# Patient Record
Sex: Male | Born: 1950 | Race: Black or African American | Hispanic: No | Marital: Married | Smoking: Former smoker
Health system: Southern US, Community
[De-identification: ages and names within clinical notes are randomized; demographics above are authoritative.]

## PROBLEM LIST (undated history)

## (undated) DIAGNOSIS — N529 Male erectile dysfunction, unspecified: Secondary | ICD-10-CM

## (undated) DIAGNOSIS — G40909 Epilepsy, unspecified, not intractable, without status epilepticus: Secondary | ICD-10-CM

## (undated) DIAGNOSIS — R569 Unspecified convulsions: Secondary | ICD-10-CM

## (undated) DIAGNOSIS — I10 Essential (primary) hypertension: Secondary | ICD-10-CM

## (undated) DIAGNOSIS — F419 Anxiety disorder, unspecified: Secondary | ICD-10-CM

## (undated) DIAGNOSIS — E119 Type 2 diabetes mellitus without complications: Secondary | ICD-10-CM

## (undated) DIAGNOSIS — I82412 Acute embolism and thrombosis of left femoral vein: Secondary | ICD-10-CM

## (undated) DIAGNOSIS — E785 Hyperlipidemia, unspecified: Secondary | ICD-10-CM

## (undated) HISTORY — PX: CHOLECYSTECTOMY: SHX55

## (undated) HISTORY — DX: Anxiety disorder, unspecified: F41.9

## (undated) HISTORY — PX: KNEE SURGERY: SHX244

## (undated) HISTORY — DX: Hyperlipidemia, unspecified: E78.5

## (undated) HISTORY — DX: Male erectile dysfunction, unspecified: N52.9

## (undated) HISTORY — DX: Epilepsy, unspecified, not intractable, without status epilepticus: G40.909

---

## 1998-02-08 ENCOUNTER — Emergency Department (HOSPITAL_COMMUNITY): Admission: EM | Admit: 1998-02-08 | Discharge: 1998-02-08 | Payer: Self-pay | Admitting: Emergency Medicine

## 1998-03-23 ENCOUNTER — Ambulatory Visit (HOSPITAL_COMMUNITY): Admission: RE | Admit: 1998-03-23 | Discharge: 1998-03-23 | Payer: Self-pay | Admitting: Cardiology

## 1998-04-18 ENCOUNTER — Ambulatory Visit (HOSPITAL_COMMUNITY): Admission: RE | Admit: 1998-04-18 | Discharge: 1998-04-18 | Payer: Self-pay | Admitting: Gastroenterology

## 1998-09-24 ENCOUNTER — Emergency Department (HOSPITAL_COMMUNITY): Admission: EM | Admit: 1998-09-24 | Discharge: 1998-09-24 | Payer: Self-pay | Admitting: *Deleted

## 1999-02-06 ENCOUNTER — Other Ambulatory Visit: Admission: RE | Admit: 1999-02-06 | Discharge: 1999-02-06 | Payer: Self-pay | Admitting: Urology

## 1999-10-07 ENCOUNTER — Encounter: Admission: RE | Admit: 1999-10-07 | Discharge: 2000-01-05 | Payer: Self-pay | Admitting: Family Medicine

## 1999-12-03 ENCOUNTER — Ambulatory Visit (HOSPITAL_COMMUNITY): Admission: RE | Admit: 1999-12-03 | Discharge: 1999-12-03 | Payer: Self-pay | Admitting: Family Medicine

## 2000-01-22 ENCOUNTER — Ambulatory Visit (HOSPITAL_BASED_OUTPATIENT_CLINIC_OR_DEPARTMENT_OTHER): Admission: RE | Admit: 2000-01-22 | Discharge: 2000-01-22 | Payer: Self-pay | Admitting: Family Medicine

## 2000-05-11 ENCOUNTER — Encounter: Payer: Self-pay | Admitting: Orthopedic Surgery

## 2000-05-12 ENCOUNTER — Ambulatory Visit (HOSPITAL_COMMUNITY): Admission: RE | Admit: 2000-05-12 | Discharge: 2000-05-12 | Payer: Self-pay | Admitting: Orthopedic Surgery

## 2000-06-12 ENCOUNTER — Encounter: Admission: RE | Admit: 2000-06-12 | Discharge: 2000-07-23 | Payer: Self-pay | Admitting: Orthopedic Surgery

## 2000-07-09 ENCOUNTER — Emergency Department (HOSPITAL_COMMUNITY): Admission: EM | Admit: 2000-07-09 | Discharge: 2000-07-10 | Payer: Self-pay | Admitting: Emergency Medicine

## 2002-07-08 ENCOUNTER — Encounter: Payer: Self-pay | Admitting: Emergency Medicine

## 2002-07-08 ENCOUNTER — Emergency Department (HOSPITAL_COMMUNITY): Admission: EM | Admit: 2002-07-08 | Discharge: 2002-07-08 | Payer: Self-pay | Admitting: Emergency Medicine

## 2002-07-16 ENCOUNTER — Emergency Department (HOSPITAL_COMMUNITY): Admission: EM | Admit: 2002-07-16 | Discharge: 2002-07-16 | Payer: Self-pay | Admitting: Emergency Medicine

## 2002-07-16 ENCOUNTER — Encounter: Payer: Self-pay | Admitting: Emergency Medicine

## 2002-08-16 ENCOUNTER — Encounter: Admission: RE | Admit: 2002-08-16 | Discharge: 2002-11-14 | Payer: Self-pay | Admitting: Orthopedic Surgery

## 2003-07-31 ENCOUNTER — Encounter: Admission: RE | Admit: 2003-07-31 | Discharge: 2003-10-29 | Payer: Self-pay | Admitting: Family Medicine

## 2004-05-13 ENCOUNTER — Encounter (INDEPENDENT_AMBULATORY_CARE_PROVIDER_SITE_OTHER): Payer: Self-pay | Admitting: *Deleted

## 2004-05-13 ENCOUNTER — Ambulatory Visit (HOSPITAL_COMMUNITY): Admission: RE | Admit: 2004-05-13 | Discharge: 2004-05-13 | Payer: Self-pay | Admitting: Gastroenterology

## 2004-07-02 ENCOUNTER — Inpatient Hospital Stay (HOSPITAL_COMMUNITY): Admission: EM | Admit: 2004-07-02 | Discharge: 2004-07-03 | Payer: Self-pay | Admitting: Emergency Medicine

## 2004-07-09 ENCOUNTER — Observation Stay (HOSPITAL_COMMUNITY): Admission: EM | Admit: 2004-07-09 | Discharge: 2004-07-10 | Payer: Self-pay | Admitting: Emergency Medicine

## 2005-09-03 ENCOUNTER — Emergency Department (HOSPITAL_COMMUNITY): Admission: EM | Admit: 2005-09-03 | Discharge: 2005-09-03 | Payer: Self-pay | Admitting: Emergency Medicine

## 2006-08-30 ENCOUNTER — Emergency Department (HOSPITAL_COMMUNITY): Admission: EM | Admit: 2006-08-30 | Discharge: 2006-08-30 | Payer: Self-pay | Admitting: Emergency Medicine

## 2008-01-03 ENCOUNTER — Encounter: Admission: RE | Admit: 2008-01-03 | Discharge: 2008-01-03 | Payer: Self-pay | Admitting: Family Medicine

## 2008-02-22 ENCOUNTER — Encounter: Admission: RE | Admit: 2008-02-22 | Discharge: 2008-02-22 | Payer: Self-pay | Admitting: Orthopedic Surgery

## 2008-09-26 ENCOUNTER — Ambulatory Visit: Payer: Self-pay | Admitting: Hematology and Oncology

## 2008-10-10 LAB — URINALYSIS, MICROSCOPIC - CHCC
Bilirubin (Urine): NEGATIVE
Glucose: NEGATIVE g/dL
Ketones: NEGATIVE mg/dL
Leukocyte Esterase: NEGATIVE
Nitrite: NEGATIVE
Protein: NEGATIVE mg/dL
Specific Gravity, Urine: 1.005 (ref 1.003–1.035)
WBC, UA: NEGATIVE (ref 0–2)
pH: 6 (ref 4.6–8.0)

## 2008-10-10 LAB — CBC WITH DIFFERENTIAL/PLATELET
BASO%: 0.3 % (ref 0.0–2.0)
Basophils Absolute: 0 10*3/uL (ref 0.0–0.1)
EOS%: 4 % (ref 0.0–7.0)
Eosinophils Absolute: 0.2 10*3/uL (ref 0.0–0.5)
HCT: 38.2 % — ABNORMAL LOW (ref 38.4–49.9)
HGB: 12.7 g/dL — ABNORMAL LOW (ref 13.0–17.1)
LYMPH%: 24.8 % (ref 14.0–49.0)
MCH: 26.4 pg — ABNORMAL LOW (ref 27.2–33.4)
MCHC: 33.3 g/dL (ref 32.0–36.0)
MCV: 79.2 fL — ABNORMAL LOW (ref 79.3–98.0)
MONO#: 0.2 10*3/uL (ref 0.1–0.9)
MONO%: 3.1 % (ref 0.0–14.0)
NEUT#: 3.8 10*3/uL (ref 1.5–6.5)
NEUT%: 67.8 % (ref 39.0–75.0)
Platelets: 245 10*3/uL (ref 140–400)
RBC: 4.82 10*6/uL (ref 4.20–5.82)
RDW: 15.6 % — ABNORMAL HIGH (ref 11.0–14.6)
WBC: 5.6 10*3/uL (ref 4.0–10.3)
lymph#: 1.4 10*3/uL (ref 0.9–3.3)

## 2008-10-13 LAB — HEMOGLOBINOPATHY EVALUATION
Hemoglobin Other: 0 % (ref 0.0–0.0)
Hgb A2 Quant: 3.6 % — ABNORMAL HIGH (ref 2.2–3.2)
Hgb A: 65 % — ABNORMAL LOW (ref 96.8–97.8)
Hgb F Quant: 0 % (ref 0.0–2.0)
Hgb S Quant: 31.4 % — ABNORMAL HIGH (ref 0.0–0.0)

## 2008-10-13 LAB — COMPREHENSIVE METABOLIC PANEL
ALT: 14 U/L (ref 0–53)
AST: 13 U/L (ref 0–37)
Albumin: 4.5 g/dL (ref 3.5–5.2)
Alkaline Phosphatase: 43 U/L (ref 39–117)
BUN: 14 mg/dL (ref 6–23)
CO2: 27 mEq/L (ref 19–32)
Calcium: 9.8 mg/dL (ref 8.4–10.5)
Chloride: 101 mEq/L (ref 96–112)
Creatinine, Ser: 1.05 mg/dL (ref 0.40–1.50)
Glucose, Bld: 122 mg/dL — ABNORMAL HIGH (ref 70–99)
Potassium: 4.2 mEq/L (ref 3.5–5.3)
Sodium: 138 mEq/L (ref 135–145)
Total Bilirubin: 0.3 mg/dL (ref 0.3–1.2)
Total Protein: 6.9 g/dL (ref 6.0–8.3)

## 2008-10-13 LAB — VITAMIN B12: Vitamin B-12: 356 pg/mL (ref 211–911)

## 2008-10-13 LAB — PROTEIN ELECTROPHORESIS, SERUM, WITH REFLEX
Albumin ELP: 62.3 % (ref 55.8–66.1)
Alpha-1-Globulin: 4.7 % (ref 2.9–4.9)
Alpha-2-Globulin: 10.5 % (ref 7.1–11.8)
Beta 2: 4.7 % (ref 3.2–6.5)
Beta Globulin: 7.1 % (ref 4.7–7.2)
Gamma Globulin: 10.7 % — ABNORMAL LOW (ref 11.1–18.8)
Total Protein, Serum Electrophoresis: 6.9 g/dL (ref 6.0–8.3)

## 2008-10-13 LAB — DIRECT ANTIGLOBULIN TEST (NOT AT ARMC)
DAT (Complement): NEGATIVE
DAT IgG: NEGATIVE

## 2008-10-13 LAB — IGG, IGA, IGM
IgA: 134 mg/dL (ref 68–378)
IgG (Immunoglobin G), Serum: 774 mg/dL (ref 694–1618)
IgM, Serum: 25 mg/dL — ABNORMAL LOW (ref 60–263)

## 2008-10-13 LAB — HAPTOGLOBIN: Haptoglobin: 209 mg/dL — ABNORMAL HIGH (ref 16–200)

## 2008-10-13 LAB — IRON AND TIBC
%SAT: 18 % — ABNORMAL LOW (ref 20–55)
Iron: 71 ug/dL (ref 42–165)
TIBC: 391 ug/dL (ref 215–435)
UIBC: 320 ug/dL

## 2008-10-13 LAB — IFE INTERPRETATION

## 2008-10-13 LAB — FERRITIN: Ferritin: 13 ng/mL — ABNORMAL LOW (ref 22–322)

## 2009-04-12 ENCOUNTER — Ambulatory Visit: Payer: Self-pay | Admitting: Oncology

## 2009-04-24 LAB — CBC WITH DIFFERENTIAL/PLATELET
BASO%: 0.1 % (ref 0.0–2.0)
Basophils Absolute: 0 10*3/uL (ref 0.0–0.1)
EOS%: 3 % (ref 0.0–7.0)
Eosinophils Absolute: 0.2 10*3/uL (ref 0.0–0.5)
HCT: 36.7 % — ABNORMAL LOW (ref 38.4–49.9)
HGB: 12.3 g/dL — ABNORMAL LOW (ref 13.0–17.1)
LYMPH%: 29.2 % (ref 14.0–49.0)
MCH: 25.9 pg — ABNORMAL LOW (ref 27.2–33.4)
MCHC: 33.5 g/dL (ref 32.0–36.0)
MCV: 77.3 fL — ABNORMAL LOW (ref 79.3–98.0)
MONO#: 0.4 10*3/uL (ref 0.1–0.9)
MONO%: 5.2 % (ref 0.0–14.0)
NEUT#: 4.4 10*3/uL (ref 1.5–6.5)
NEUT%: 62.5 % (ref 39.0–75.0)
Platelets: 242 10*3/uL (ref 140–400)
RBC: 4.75 10*6/uL (ref 4.20–5.82)
RDW: 14.6 % (ref 11.0–14.6)
WBC: 7.1 10*3/uL (ref 4.0–10.3)
lymph#: 2.1 10*3/uL (ref 0.9–3.3)

## 2009-04-24 LAB — IRON AND TIBC
%SAT: 29 % (ref 20–55)
Iron: 104 ug/dL (ref 42–165)
TIBC: 359 ug/dL (ref 215–435)
UIBC: 255 ug/dL

## 2009-04-24 LAB — FERRITIN: Ferritin: 32 ng/mL (ref 22–322)

## 2009-06-20 ENCOUNTER — Encounter: Admission: RE | Admit: 2009-06-20 | Discharge: 2009-06-20 | Payer: Self-pay | Admitting: Orthopedic Surgery

## 2009-07-03 ENCOUNTER — Encounter: Admission: RE | Admit: 2009-07-03 | Discharge: 2009-10-01 | Payer: Self-pay | Admitting: Orthopedic Surgery

## 2009-10-01 ENCOUNTER — Inpatient Hospital Stay (HOSPITAL_COMMUNITY): Admission: EM | Admit: 2009-10-01 | Discharge: 2009-10-03 | Payer: Self-pay | Admitting: Emergency Medicine

## 2009-10-01 ENCOUNTER — Emergency Department (HOSPITAL_COMMUNITY): Admission: EM | Admit: 2009-10-01 | Discharge: 2009-10-01 | Payer: Self-pay | Admitting: Family Medicine

## 2009-10-31 ENCOUNTER — Ambulatory Visit: Payer: Self-pay | Admitting: Oncology

## 2010-06-24 ENCOUNTER — Encounter: Payer: Self-pay | Admitting: Orthopedic Surgery

## 2010-08-20 LAB — CBC
HCT: 36.6 % — ABNORMAL LOW (ref 39.0–52.0)
Hemoglobin: 12 g/dL — ABNORMAL LOW (ref 13.0–17.0)
MCHC: 32.9 g/dL (ref 30.0–36.0)
MCV: 82.1 fL (ref 78.0–100.0)
Platelets: 216 10*3/uL (ref 150–400)
RBC: 4.46 MIL/uL (ref 4.22–5.81)
RDW: 15 % (ref 11.5–15.5)
WBC: 5.4 10*3/uL (ref 4.0–10.5)

## 2010-08-20 LAB — TSH: TSH: 1.53 u[IU]/mL (ref 0.350–4.500)

## 2010-08-20 LAB — BASIC METABOLIC PANEL
BUN: 11 mg/dL (ref 6–23)
CO2: 29 mEq/L (ref 19–32)
Calcium: 8.8 mg/dL (ref 8.4–10.5)
Chloride: 102 mEq/L (ref 96–112)
Creatinine, Ser: 0.93 mg/dL (ref 0.4–1.5)
GFR calc Af Amer: >60 mL/min (ref >60)
GFR calc non Af Amer: >60 mL/min (ref >60)
Glucose, Bld: 90 mg/dL (ref 70–99)
Potassium: 3.8 mEq/L (ref 3.5–5.1)
Sodium: 136 mEq/L (ref 135–145)

## 2010-08-20 LAB — CK TOTAL AND CKMB (NOT AT ARMC)
CK, MB: 0.7 ng/mL (ref 0.3–4.0)
Relative Index: 0.3 (ref 0.0–2.5)
Total CK: 219 U/L (ref 7–232)

## 2010-08-20 LAB — CARDIAC PANEL(CRET KIN+CKTOT+MB+TROPI)
CK, MB: 0.5 ng/mL (ref 0.3–4.0)
CK, MB: 0.6 ng/mL (ref 0.3–4.0)
Relative Index: 0.3 (ref 0.0–2.5)
Relative Index: 0.3 (ref 0.0–2.5)
Total CK: 155 U/L (ref 7–232)
Total CK: 176 U/L (ref 7–232)
Troponin I: 0.02 ng/mL (ref 0.00–0.06)
Troponin I: <0.01 ng/mL (ref 0.00–0.06)

## 2010-08-20 LAB — POCT CARDIAC MARKERS
CKMB, poc: <1 ng/mL — ABNORMAL LOW (ref 1.0–8.0)
Myoglobin, poc: 53.9 ng/mL (ref 12–200)
Troponin i, poc: <0.05 ng/mL (ref 0.00–0.09)

## 2010-08-20 LAB — LIPID PANEL
Cholesterol: 166 mg/dL (ref 0–200)
Cholesterol: 185 mg/dL (ref 0–200)
HDL: 62 mg/dL (ref >39)
HDL: 79 mg/dL (ref >39)
LDL Cholesterol: 97 mg/dL (ref 0–99)
LDL Cholesterol: 98 mg/dL (ref 0–99)
Total CHOL/HDL Ratio: 2.3 RATIO
Total CHOL/HDL Ratio: 2.7 RATIO
Triglycerides: 33 mg/dL (ref <150)
Triglycerides: 39 mg/dL (ref <150)
VLDL: 7 mg/dL (ref 0–40)
VLDL: 8 mg/dL (ref 0–40)

## 2010-08-20 LAB — GLUCOSE, CAPILLARY
Glucose-Capillary: 101 mg/dL — ABNORMAL HIGH (ref 70–99)
Glucose-Capillary: 102 mg/dL — ABNORMAL HIGH (ref 70–99)
Glucose-Capillary: 106 mg/dL — ABNORMAL HIGH (ref 70–99)
Glucose-Capillary: 122 mg/dL — ABNORMAL HIGH (ref 70–99)
Glucose-Capillary: 147 mg/dL — ABNORMAL HIGH (ref 70–99)
Glucose-Capillary: 152 mg/dL — ABNORMAL HIGH (ref 70–99)
Glucose-Capillary: 90 mg/dL (ref 70–99)

## 2010-08-20 LAB — POCT I-STAT, CHEM 8
BUN: 13 mg/dL (ref 6–23)
Calcium, Ion: 1.23 mmol/L (ref 1.12–1.32)
Chloride: 104 mEq/L (ref 96–112)
Creatinine, Ser: 0.9 mg/dL (ref 0.4–1.5)
Glucose, Bld: 89 mg/dL (ref 70–99)
HCT: 41 % (ref 39.0–52.0)
Hemoglobin: 13.9 g/dL (ref 13.0–17.0)
Potassium: 4.2 mEq/L (ref 3.5–5.1)
Sodium: 141 mEq/L (ref 135–145)
TCO2: 31 mmol/L (ref 0–100)

## 2010-08-20 LAB — APTT: aPTT: 30 seconds (ref 24–37)

## 2010-08-20 LAB — TROPONIN I: Troponin I: 0.11 ng/mL — ABNORMAL HIGH (ref 0.00–0.06)

## 2010-10-18 NOTE — Discharge Summary (Signed)
NAMEEDRIS, Darryl [name]                ACCOUNT NO.:  [id number]   MEDICAL RECORD NO.:  [id number]          PATIENT TYPE:  INP   LOCATION:  4733                         FACILITY:  [facility]   PHYSICIAN:  Danae Chen, M.D.DATE OF BIRTH:  November 22, 1950   DATE OF ADMISSION:  07/02/2004  DATE OF DISCHARGE:                                 DISCHARGE SUMMARY   PRIMARY CARE PHYSICIAN:  Dr. Renaye Rakers.   CARDIOLOGIST:  Dr. Elsie Lincoln of [facility].   DISCHARGE DIAGNOSES:  1.  Chest pain.  2.  Diabetes.  3.  Hypertension.  4.  Reflux disease.   DISCHARGE MEDICATIONS:  The patient is to resume his home medication list.  This includes:  1.  Prevacid 30 mg p.o. daily.  2.  Cozaar 50 mg p.o. daily.  3.  Aspirin 81 mg p.o. daily.  4.  Actos 15 mg p.o. daily.  5.  Glucophage 1000 mg p.o. daily.  6.  Lamictal 200 mg p.o. daily.  7.  Lasix 10 mg p.o. daily.  8.  Xanax 1 mg p.o. b.i.d. p.r.n.   BRIEF HOSPITAL COURSE:  The patient is a 60 year old African-American male  with cardiac risk factors that include diabetes and hypertension who had  episodes of recurrent intermittent chest pain over the last several weeks.  The last episode prompted him to visit the emergency room.  He is admitted  for further evaluation.  EKG was normal at the time of admission.  Cardiac  enzymes and cardiac markers are also negative.  The patient has been pain  free since his admission.  His vital signs are stable and physical exam is  unchanged at the time of discharge.  He has had a Cardiolite performed today  - July 03, 2004 - results of which are pending at this time.  If the  results show no inducible ischemia, the patient will be discharged home with  close follow-up with Dr. Elsie Lincoln of [facility].  The patient also has a lipid panel and homocysteine levels which are also  pending, and dependent on the results of that he may or may not need a  statin.  We  will defer this decision to his primary care physician.  The  patient is to continue his other medications as noted.   OTHER PERTINENT DISCHARGE LABORATORY DATA:  As noted, the lipid panel which  is pending and negative cardiac markers.  Also had a hemoglobin of 15.6.  Normal PT and INR.  Normal basic metabolic panel with  a BUN of 9, creatinine of 1.1.  The patient also had a CT angiogram which  was negative for pulmonary emboli.  Hemoglobin A1c was 6.1.  Portable chest  x-ray showed no evidence of acute disease.   CONDITION ON DISCHARGE:  Improved.      RLK/MEDQ  D:  07/03/2004  T:  07/03/2004  Job:  [id number]

## 2010-10-18 NOTE — Op Note (Signed)
NAMEAPOLLO, TIMOTHY                ACCOUNT NO.:  [id number]   MEDICAL RECORD NO.:  [id number]          PATIENT TYPE:  AMB   LOCATION:  ENDO                         FACILITY:  [facility]   PHYSICIAN:  Anselmo Rod, M.D.  DATE OF BIRTH:  03/19/1951   DATE OF PROCEDURE:  DATE OF DISCHARGE:                                 OPERATIVE REPORT   DATE OF PROCEDURE:  May 13, 2004.   PROCEDURE PERFORMED:  Screening colonoscopy   ENDOSCOPIST:  Anselmo Rod, MD.   INSTRUMENT USED:  Olympus video colonoscope.   INDICATIONS FOR PROCEDURE:  Rectal bleeding and a formal weight loss of 50  pounds that has been unintentional in the recent past in a 60 year old,  African-American male.  Rule out colonic polyps, masses, etc.   PRE-PROCEDURE PREPARATION:  Informed consent was procured from the patient.  The patient was fasted for eight hours prior to the procedure and prepped  with a bottle of magnesium citrate and a gallon of GoLYTELY the night prior  to the procedure.   PRE-PROCEDURE PHYSICAL:  VITAL SIGNS:  The patient had stable vital signs.  NECK:  Supple.  CHEST:  Clear to auscultation, S1 and S2 regular.  ABDOMEN:  Soft with normal bowel sounds.   DESCRIPTION OF PROCEDURE:  The patient was placed in the left lateral  decubitus position and sedated with 50 mg of Demerol and 5 mg of Versed in  slow, incremental doses.  Once the patient was adequately sedated,  maintained on low flow oxygen, and continuous cardiac monitoring, the  Olympus video colonoscope was advanced into the rectum and the cecum.  There  was evidence of pan-diverticulosis with most prominent changes in the left  colon.  Small, internal hemorrhoids were seen on retroflexion.  The  procedure was completed up to the cecum, and the appendiceal orifices and  ileocecal valve were clearly visualized and photographed.  Retroflexion in  the rectum revealed small, internal hemorrhoids.  The patient tolerated the  procedure well  without complications.  The terminal ileum appeared healthy  and without lesions.   IMPRESSION:  1.  Pan-diverticulosis with more prominence seen within the sigmoid colon.  2.  Small, internal hemorrhoids.  3.  Normal terminal ileum.   RECOMMENDATIONS:  1.  Continue a high fiber diet with liberal fluid intake.  2.  Repeat colonoscopy in the next five years unless the patient develops      any abnormal symptoms in the interim.  3.  Outpatient followup in the next two weeks for further recommendations.      Jyot   JNM/MEDQ  D:  05/13/2004  T:  05/13/2004  Job:  [id number]   cc:   Renaye Rakers, M.D.  [phone]  [place]  [place] [address]  Fax: [phone]

## 2010-10-18 NOTE — Cardiovascular Report (Signed)
NAMESAMUELE, STOREY                ACCOUNT NO.:  [id number]   MEDICAL RECORD NO.:  [id number]          PATIENT TYPE:  INP   LOCATION:  3729                         FACILITY:  [facility]   PHYSICIAN:  Cristy Hilts. Jacinto Halim, MD       DATE OF BIRTH:  Sep 13, 1950   DATE OF PROCEDURE:  07/10/2004  DATE OF DISCHARGE:                              CARDIAC CATHETERIZATION   REFERRING PHYSICIAN:  Renaye Rakers, M.D.   PROCEDURES PERFORMED:  1.  Left ventriculography.  2.  Right and left coronary arteriography.  3.  Abdominal aortogram.   INDICATIONS:  Darryl [name] is a 60 year old gentleman who has history of  hypertension, hyperlipidemia, diabetes who has been having recurrent chest  discomfort.  He had undergone a Cardiolite stress test which was negative  for myocardial ischemia.  He was seen in my office 2 days ago with chest  discomfort and I advised him cardiac catheterization for the definitive  diagnosis of coronary disease; however, he wanted to think it about this;  however, he presented to the emergency room the next day with chest pain.  After admitting to the hospital as unstable angina, he was brought to the  cardiac catheterization lab for definitive diagnosis of coronary artery  disease.   HEMODYNAMIC DATA:  The left ventricular pressures were 121/500, end-  diastolic of 11 mmHg.  The aortic pressure was 121/77 with a mean of 99  mmHg.  There was no pressure across the aortic valve.   ASCENDING AORTOGRAM:  The ascending aortogram revealed presence of three  aortic valve cusps.  There was an acute angle takeoff of the left  ventricular outflow tract.   ANGIOGRAPHIC DATA:  Left ventricle:  The left ventricular systolic function  is normal.  The ejection fraction was estimated at 60%.  There was no  significant mitral regurgitation.   Right coronary artery:  The right coronary artery has an inferior takeoff.  This is secondary to an angulated aortic root.  The right coronary itself  was  very highly tortuous making a 360 degree turn in its mid segment;  otherwise this RCA was normal.   Left main:  The left main was a large caliber vessel, normal.   Circumflex:  The circumflex was a large caliber vessel.  It gives origin to  a large obtuse marginal.  It gives rise to a obtuse marginal 2 after giving  the AV groove branch.  It is normal.   Left ventricle:  The left ventricle is a large caliber vessel.  It gives  origin to three small to moderate diagonal vessels.  It ends just after  wrapping around the apex.  It is normal.   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  They are widely patent.  There is no evidence  of abdominal aortic aneurysm.   IMPRESSION:  1.  Normal coronary arteries with normal left ventricular systolic function.  2.  Angulated take-off of the aortic root with inferior takeoff of the right      coronary artery.  3.  No significant change in coronary anatomy compared to March 23, 1998.   RECOMMENDATIONS:  Evaluation for noncardiac cause of chest pain is  indicated.  Primary prevention is indicated.   TECHNIQUES OF PROCEDURE:  Under the usual sterile precautions, using a 6-  French right femoral artery access, a 6 [place] multipurpose B2 catheter was  advanced into the ascending aorta over a 0.035 J-wire.  The catheter was  then gently advanced to the left ventricle and left ventricular pressure was  monitored.  Hand contrast injection was performed both in the LAO and RAO  projections.  The catheter was flushed with saline and pulled back in the  ascending aorta.  Pressure gradient across the aortic valve was monitored.  The right coronary artery was selectively engaged and angiography was  performed.  In a similar fashion, the left main coronary artery was  subsequently engaged, but because of inability to engage the left main, the  catheter was pulled out of the body and the  6-French Judkins diagnostic  catheter was utilized to engage the left main coronary artery and  angiography was repeated.  Then the catheter was pulled out of the body in  the usual fashion.  Before pulling the MPB2 catheter out of the body, the  abdominal aortogram was performed.  The ascending aortogram was performed  because of the inability to cannulate the RCA.  This was done in the lateral  projection.  Oral 110 mL of contrast was utilized.  No immediate  complications were noted.  The patient tolerated the procedure well.   Right femoral angiography was performed through the arterial access sheath  and access was closed with Angioseal with excellent hemostasis obtained.      JRG/MEDQ  D:  07/10/2004  T:  07/10/2004  Job:  [id number]   cc:   Renaye Rakers, M.D.  [address]  [place]  [place] [address]  Fax: [phone]

## 2010-10-18 NOTE — H&P (Signed)
Darryl [name], STAN                ACCOUNT NO.:  [id number]   MEDICAL RECORD NO.:  [id number]          PATIENT TYPE:  INP   LOCATION:  1833                         FACILITY:  [facility]   PHYSICIAN:  Jonna L. Robb Matar, M.D.DATE OF BIRTH:  [birth year]   DATE OF ADMISSION:  07/02/2004  DATE OF DISCHARGE:                                HISTORY & PHYSICAL   PRIMARY CARE PHYSICIAN:  Renaye Rakers, M.D.   CARDIOLOGIST:  Madaline Savage, M.D.   CHIEF COMPLAINT:  Chest pain.   HISTORY:  This is a 60 year old African-American hypotensive diabetic male  who first had an episode about a month ago of chest pain and then again  about a week ago.  He had also recently moved a washer and a dryer.  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  His blood pressures are usually  excellently controlled, but the last few times he has gotten 156/110,  140/95.  He got an episode of chest pain today starting at around 9 a.m.,  and came to the hospital around 10:30.  He has been treated with  nitroglycerin, morphine, GI cocktails, but not much improvement.  He still  tends to get this soreness and throbbing in the left chest, maybe a couple  of inches off the sternum, it moves a little bit towards the left axilla,  not pleuritic, not associated with food, there is nothing specific he can  think of that makes it either better or worse.   About six or seven years ago he also had some chest pain.  At that time, he  had some cardiac evaluation by Dr. Elsie Lincoln, including what sounds like stress  test, and was also told that the source of his chest pain was his  hypertension.   ALLERGIES:  No known drug allergies.   MEDICATIONS:  1.  Cozaar.  2.  Lamictal 250 mg q.a.m.  3.  Nexium q.a.m.  4.  Actos at bedtime.  5.  Metformin three pills at bedtime.  He is not really sure what the dosages are.   PAST MEDICAL HISTORY:  1.  GERD.  2.  Hypertension, which he has had for about six years.  3.  Type 2 diabetes.  He has had this for a year.  He said when it was found      his sugar was 600, and he really was not aware of it.  4.  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  I think his      last one was about six months ago, and he does get small episodes of      abstince which really are not even noticed by other people.  5.  Rectal bleed in December 2005, was found to have pan diverticulosis by      Dr. Loreta Ave.  6.  Hyperlipidemia by history, although it does not look like he is on any      statin medications.  PAST SURGICAL HISTORY:  1.  Laparoscopic cholecystectomy.  2.  Left knee surgery.   FAMILY HISTORY:  Alzheimer's, diabetes, aneurysm.   SOCIAL HISTORY:  He is married, nonsmoker, nondrinker, four children, works  as an Airline pilot at [employer].   REVIEW OF SYSTEMS:  The patient denies any weight loss at this time,  although he had a gradual 50 pound weight loss that was noted in the  information of his December colonoscopy.  He denies visual problems,  coughing, wheezing, asthma.  He does have peripheral edema.  He does not  think that has gotten worse even though he has been eating a lot of salty  fast food.  No nausea, vomiting, diarrhea, hematemesis.  No kidney problems.  No prostate disease.  No thyroid problems.  Denies any unusual bleeding or  bruising.  No arthritis, no headache or CVA.  Other review of systems are  negative.   PHYSICAL EXAMINATION:  VITAL SIGNS:  Temperature 98.1, pulse 62,  respirations 20, blood pressure 127/77, he is 100% O2.  GENERAL:  He is a well-developed African-American male.  HEENT:  Normal conjunctivae and lids.  Pupils are slightly constricted.  EOM's are full.  Normal hearing, mucosa, pharynx.  NECK:  No masses, thyromegaly, or carotid bruits.  There is a lipoma on the  upper back.  RESPIRATORY:  Respiratory effort is normal.  LUNGS:  Clear at the bases.   There is no wheezes, rales, or dullness.  HEART:  Regular rate and rhythm, normal S1 and S2, without murmurs, rubs, or  gallops.  Pulses without bruits.  EXTREMITIES:  There is no cyanosis or clubbing.  He has 1+ bilateral edema.  ABDOMEN:  Nontender, normal bowel sounds, no hepatosplenomegaly, no hernias.  No adenopathy.  NEUROLOGIC:  Muscle strength is 5/5 with full range of motion in all  extremities.  Cranial nerves are intact.  DTR's are 2+ in the knees.  He has  normal sensation in the feet.  He is alert and oriented x3, normal memory,  judgment, and affect.  SKIN:  No rashes, lesions, or nodules.   LABORATORY DATA:  EKG is normal with an occasional rare PVC.  Sugar is 93.  The rest of the CMP is normal.  CBC is normal.  First two sets of point-of-  care are negative.  Chest x-ray shows no acute disease.   IMPRESSION:  1.  Chest pain.  This has some elements of costochondritis.  It ended up      tender when he pressed on it and has a throbbing quality to it.      However, of course with his risk factors, he certainly needs to have      coronary artery disease ruled out.  I contacted Dr. Jenne Campus and he will      have stress test tomorrow done by [facility].  2.  Hypertension.  This may be the source of the chest pain.  I am going to      give him some diuresis and continue his Cozaar.  3.  Type 2 diabetes.  Evaluate for his medications.  4.  Hyperlipidemia.  Let us check this and see if we need to start him on      any statin medications.  5.  Petymal seizures.  6.  Diverticulosis.      JLB/MEDQ  D:  07/02/2004  T:  07/02/2004  Job:  [id number]   cc:   Renaye Rakers, M.D.  [phone] [address],  [address]  [place]  [place] [address]  Fax: [phone]

## 2012-03-08 ENCOUNTER — Other Ambulatory Visit: Payer: Self-pay | Admitting: Family Medicine

## 2012-03-08 ENCOUNTER — Ambulatory Visit
Admission: RE | Admit: 2012-03-08 | Discharge: 2012-03-08 | Disposition: A | Discharge status: Home / Self Care | Payer: BC Managed Care – PPO | Source: Ambulatory Visit | Attending: Family Medicine | Admitting: Family Medicine

## 2012-03-08 DIAGNOSIS — R0602 Shortness of breath: Secondary | ICD-10-CM

## 2013-04-29 ENCOUNTER — Emergency Department (HOSPITAL_COMMUNITY)
Admission: EM | Admit: 2013-04-29 | Discharge: 2013-04-29 | Disposition: A | Discharge status: Home / Self Care | Payer: BC Managed Care – PPO | Source: Home / Self Care | Attending: Emergency Medicine | Admitting: Emergency Medicine

## 2013-04-29 ENCOUNTER — Encounter (HOSPITAL_COMMUNITY): Payer: Self-pay | Admitting: Emergency Medicine

## 2013-04-29 ENCOUNTER — Other Ambulatory Visit (HOSPITAL_COMMUNITY)
Admission: RE | Admit: 2013-04-29 | Discharge: 2013-04-29 | Disposition: A | Discharge status: Home / Self Care | Payer: BC Managed Care – PPO | Source: Ambulatory Visit | Attending: Emergency Medicine | Admitting: Emergency Medicine

## 2013-04-29 DIAGNOSIS — N476 Balanoposthitis: Secondary | ICD-10-CM

## 2013-04-29 DIAGNOSIS — N390 Urinary tract infection, site not specified: Secondary | ICD-10-CM

## 2013-04-29 DIAGNOSIS — N481 Balanitis: Secondary | ICD-10-CM

## 2013-04-29 DIAGNOSIS — Z113 Encounter for screening for infections with a predominantly sexual mode of transmission: Secondary | ICD-10-CM | POA: Insufficient documentation

## 2013-04-29 HISTORY — DX: Essential (primary) hypertension: I10

## 2013-04-29 HISTORY — DX: Type 2 diabetes mellitus without complications: E11.9

## 2013-04-29 HISTORY — DX: Unspecified convulsions: R56.9

## 2013-04-29 LAB — POCT URINALYSIS DIP (DEVICE)
Bilirubin Urine: NEGATIVE
Glucose, UA: NEGATIVE mg/dL
Leukocytes, UA: NEGATIVE
Nitrite: NEGATIVE
Protein, ur: >=300 mg/dL — AB
Specific Gravity, Urine: >=1.03 (ref 1.005–1.030)
Urobilinogen, UA: 0.2 mg/dL (ref 0.0–1.0)
pH: 5.5 (ref 5.0–8.0)

## 2013-04-29 MED ORDER — NYSTATIN 100000 UNIT/GM EX CREA: TOPICAL_CREAM | CUTANEOUS | Status: DC

## 2013-04-29 MED ORDER — CIPROFLOXACIN HCL 500 MG PO TABS: 500.0000 mg | ORAL_TABLET | Freq: Two times a day (BID) | ORAL | Status: DC

## 2013-04-29 NOTE — ED Notes (Signed)
C/o 2 day duration of pain in penis; denies injury , denies d/c or pain w urination

## 2013-04-29 NOTE — ED Provider Notes (Addendum)
Chief Complaint:   Chief Complaint  Patient presents with  . Penis Pain    History of Present Illness:   Darryl [name] is a pleasant 62 year old male with type 2 diabetes who has had a two-day history of penile pain. Initially the pain was located at the tip of the penis, but then this moved up to the shaft of the penis. He's had slight burning with urination. He can feel a palpable cord on the dorsum of the penis. There is no deviation the penis, his urine stream is good. He denies any lower back pain, fever, or chills. There is no abdominal pain or testicular pain or swelling. No inguinal lymphadenopathy. The patient has had blood in his urine for years and has been evaluated by his urologist for this. He is uncircumcised.  Review of Systems:  Other than noted above, the patient denies any of the following symptoms: General:  No fevers, chills, sweats, aches, or fatigue. GI:  No abdominal pain, back pain, nausea, vomiting, diarrhea, or constipation. GU:  No dysuria, frequency, urgency, hematuria, urethral discharge, penile lesions, penile pain, testicular pain, swelling, or mass, inguinal lymphadenopathy or incontinence.  PMFSH:  Past medical history, family history, social history, meds, and allergies were reviewed.  He has no medication allergies. He has hypertension, seizures, and type 2 diabetes. Current meds include vitamin D, Lamictal, Ativan, losartan/hydrochlorothiazide, metformin, and potassium chloride.  Physical Exam:   Vital signs:  BP 147/88  Pulse 70  Temp(Src) 97.5 F (36.4 C) (Oral)  Resp 16  SpO2 98% Gen:  Alert, oriented, in no distress. Lungs:  Clear to auscultation, no wheezes, rales or rhonchi. Heart:  Regular rhythm, no gallop or murmer. Abdomen:  Flat and soft.  No tenderness to palpation, guarding, or rebound.  No hepato-splenomegaly or mass.  Bowel sounds were normally active.  No hernia. Genital exam:  He is uncircumcised. There is slight erythema around the  coronal sulcus. There is no swelling of the foreskin. 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 There no ulcerations or lesions of the penis, no urethral discharge, testes are normal, no inguinal lymphadenopathy. Rectal exam:  No masses, no tenderness, prostate is normal in size, shape, and consistency, no nodules, and stool was heme-negative Back:  No CVA tenderness.  Skin:  Clear, warm and dry.  Labs:   Results for orders placed during the hospital encounter of 04/29/13  POCT URINALYSIS DIP (DEVICE)      Result Value Range   Glucose, UA NEGATIVE  NEGATIVE mg/dL   Bilirubin Urine NEGATIVE  NEGATIVE   Ketones, ur TRACE (*) NEGATIVE mg/dL   Specific Gravity, Urine >=1.030  1.005 - 1.030   Hgb urine dipstick MODERATE (*) NEGATIVE   pH 5.5  5.0 - 8.0   Protein, ur >=300 (*) NEGATIVE mg/dL   Urobilinogen, UA 0.2  0.0 - 1.0 mg/dL   Nitrite NEGATIVE  NEGATIVE   Leukocytes, UA NEGATIVE  NEGATIVE    A urine culture was obtained as well as DNA probes for gonorrhea, Chlamydia, Trichomonas.  Assessment: The primary encounter diagnosis was Balanitis. A diagnosis of UTI (lower urinary tract infection) was also pertinent to this visit.   Most likely cause of balanitis in this diabetic, uncircumcised male with a candidate, therefore he was given nystatin. It's possible he may also have urinary tract infection since he does have some dysuria, he had some hemoglobin in his urine, but he states this is  a chronic problem and his urologist has worked this up. 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 This should clear up with time. I recommended moist warm compresses. He's taking a baby aspirin a day. He's to watch out for symptoms of Peyronie's disease such as deviation of the tip of the penis either in a flaccid or erect state. He should report this to his urologist.  Plan:   1.  Meds:  The following meds were prescribed:    Discharge Medication List as of 04/29/2013  8:22 PM    START taking these medications   Details  ciprofloxacin (CIPRO) 500 MG tablet Take 1 tablet (500 mg total) by mouth every 12 (twelve) hours., Starting 04/29/2013, Until Discontinued, Normal    nystatin cream (MYCOSTATIN) Apply to affected area 2 times daily, Normal        2.  Patient Education/Counseling:  The patient was given appropriate handouts, self care instructions, and instructed in symptomatic relief.    3.  Follow up:  The patient was told to follow up if no better in 3 to 4 days, if becoming worse in any way, and given some red flag symptoms such as fever or difficulty urinating which would prompt immediate return.  Follow up with his urologist or primary care physician for ongoing symptoms.      [name], MD 04/29/13 2059  Addendum: The patient came back today stating that the Cipro was not agreeing with him. He felt was making his blood sugar go down and his blood pressure go up. His culture did not grow anything out, but I still think he has mild prostatitis. A prescription was sent into his pharmacy for cephalexin 500 mg #30 one 3 times a day.  [name], MD 05/03/13 [phone]

## 2013-05-01 LAB — URINE CULTURE
Colony Count: NO GROWTH
Culture: NO GROWTH
Special Requests: NORMAL

## 2013-05-03 MED ORDER — CEPHALEXIN 500 MG PO CAPS: 500.0000 mg | ORAL_CAPSULE | Freq: Three times a day (TID) | ORAL | Status: DC

## 2014-02-24 ENCOUNTER — Other Ambulatory Visit: Payer: Self-pay | Admitting: Orthopedic Surgery

## 2014-02-24 ENCOUNTER — Ambulatory Visit
Admission: RE | Admit: 2014-02-24 | Discharge: 2014-02-24 | Disposition: A | Discharge status: Home / Self Care | Payer: BC Managed Care – PPO | Source: Ambulatory Visit | Attending: Orthopedic Surgery | Admitting: Orthopedic Surgery

## 2014-02-24 DIAGNOSIS — M25551 Pain in right hip: Secondary | ICD-10-CM

## 2014-03-24 ENCOUNTER — Ambulatory Visit
Admission: RE | Admit: 2014-03-24 | Discharge: 2014-03-24 | Disposition: A | Discharge status: Home / Self Care | Payer: BC Managed Care – PPO | Source: Ambulatory Visit | Attending: Orthopedic Surgery | Admitting: Orthopedic Surgery

## 2014-03-24 ENCOUNTER — Other Ambulatory Visit: Payer: Self-pay | Admitting: Orthopedic Surgery

## 2014-03-24 DIAGNOSIS — M7541 Impingement syndrome of right shoulder: Secondary | ICD-10-CM

## 2014-08-02 ENCOUNTER — Other Ambulatory Visit: Payer: Self-pay | Admitting: Family Medicine

## 2014-08-02 DIAGNOSIS — Z8719 Personal history of other diseases of the digestive system: Secondary | ICD-10-CM

## 2014-08-02 DIAGNOSIS — R109 Unspecified abdominal pain: Secondary | ICD-10-CM

## 2014-08-03 ENCOUNTER — Ambulatory Visit
Admission: RE | Admit: 2014-08-03 | Discharge: 2014-08-03 | Disposition: A | Discharge status: Home / Self Care | Payer: BC Managed Care – PPO | Source: Ambulatory Visit | Attending: Family Medicine | Admitting: Family Medicine

## 2014-08-03 ENCOUNTER — Inpatient Hospital Stay: Admission: RE | Admit: 2014-08-03 | Payer: BC Managed Care – PPO | Source: Ambulatory Visit

## 2014-08-03 MED ORDER — IOPAMIDOL (ISOVUE-300) INJECTION 61%
100.0000 mL | Freq: Once | INTRAVENOUS | Status: AC | PRN
  Administered 2014-08-03: 100 mL via INTRAVENOUS

## 2014-08-04 ENCOUNTER — Other Ambulatory Visit: Payer: BC Managed Care – PPO

## 2014-08-07 ENCOUNTER — Other Ambulatory Visit: Payer: BC Managed Care – PPO

## 2015-05-16 DIAGNOSIS — I10 Essential (primary) hypertension: Secondary | ICD-10-CM | POA: Insufficient documentation

## 2015-05-16 DIAGNOSIS — E119 Type 2 diabetes mellitus without complications: Secondary | ICD-10-CM | POA: Insufficient documentation

## 2015-05-17 ENCOUNTER — Encounter: Payer: Self-pay | Admitting: Cardiology

## 2015-05-17 ENCOUNTER — Ambulatory Visit (INDEPENDENT_AMBULATORY_CARE_PROVIDER_SITE_OTHER): Payer: BC Managed Care – PPO | Admitting: Cardiology

## 2015-05-17 VITALS — BP 116/90 | HR 69 | Ht 71.0 in | Wt 190.6 lb

## 2015-05-17 DIAGNOSIS — I493 Ventricular premature depolarization: Secondary | ICD-10-CM

## 2015-05-17 NOTE — Patient Instructions (Signed)
Medication Instructions:  Your physician recommends that you continue on your current medications as directed. Please refer to the Current Medication list given to you today.  Labwork: None ordered  Testing/Procedures: Your physician has recommended that you wear a 48 hour holter monitor. Holter monitors are medical devices that record the heart's electrical activity. Doctors most often use these monitors to diagnose arrhythmias. Arrhythmias are problems with the speed or rhythm of the heartbeat. The monitor is a small, portable device. You can wear one while you do your normal daily activities. This is usually used to diagnose what is causing palpitations/syncope (passing out).  Your physician has requested that you have a lexiscan myoview. For further information please visit [email or web address]. Please follow instruction sheet, as given.  Follow-Up: To be determined after holter monitor and stress test is completed.  Thank you for choosing [facility]!!   Dory HornSherri Tyreek Clabo, RN [phone]

## 2015-05-17 NOTE — Progress Notes (Signed)
Electrophysiology Office Note   Date:  05/17/2015   ID:  Darryl [name], DOB [birth year], MRN [id number]  PCP:  Geraldo Pitter, MD  Primary Electrophysiologist: Regan Lemming, MD    Chief Complaint  Patient presents with  . Advice Only    PVC's     History of Present Illness: Darryl [name] is a 64 y.o. male who presents today for electrophysiology evaluation.   He has a history of hypertension, diabetes, and seizures.he presents today for evaluation of PVCs. He says that he was in Pre-Op for his knee surgery and was having PVCs and pauses on the monitor. Prior to this he was not aware that he was having PVCs. He does not have palpitations. He does get chest pain that he says feels like a knot in his chest. It does not radiate and is not associated with exertion. It does get better with rest. He says that this is been going on for 4 years, he had a cardiovascular evaluation at that time which showed no abnormalities according to the patient.  Today, he denies symptoms of palpitations, chest pain, shortness of breath, orthopnea, PND, lower extremity edema, claudication, dizziness, presyncope, syncope, bleeding, or neurologic sequela. The patient is tolerating medications without difficulties and is otherwise without complaint today.    Past Medical History  Diagnosis Date  . Hypertension   . Seizures (HCC)   . Diabetes mellitus without complication [facility])    Past Surgical History  Procedure Laterality Date  . Knee surgery    . Cholecystectomy       Current Outpatient Prescriptions  Medication Sig Dispense Refill  . cephALEXin (KEFLEX) 500 MG capsule Take 1 capsule (500 mg total) by mouth 3 (three) times daily. 30 capsule 0  . etodolac (LODINE) 400 MG tablet Take 400 mg by mouth 2 (two) times daily.  3  . FeFum-FePoly-FA-B Cmp-C-Biot (INTEGRA PLUS) CAPS Take 1 capsule by mouth daily.    Marland Kitchen lamoTRIgine (LAMICTAL) 100 MG tablet Take 100 mg by mouth daily.    Marland Kitchen LORazepam (ATIVAN)  0.5 MG tablet Take 0.7 mg by mouth 2 (two) times daily.    Marland Kitchen losartan-hydrochlorothiazide (HYZAAR) 100-25 MG per tablet Take 1 tablet by mouth daily.    . metFORMIN (GLUCOPHAGE) 500 MG tablet Take 1,500 mg by mouth every evening.    . naproxen (NAPROSYN) 500 MG tablet Take 500 mg by mouth 2 (two) times daily.  2  . NEXIUM 24HR 20 MG capsule Take 40 mg by mouth daily.  4  . potassium chloride SA (K-DUR,KLOR-CON) 20 MEQ tablet Take 40 mEq by mouth 2 (two) times daily.    . Vitamin D, Ergocalciferol, (DRISDOL) 50000 UNITS CAPS capsule Take 50,000 Units by mouth once a week.  5   No current facility-administered medications for this visit.    Allergies:   Review of patient's allergies indicates no known allergies.   Social History:  The patient  reports that he has never smoked. He does not have any smokeless tobacco history on file. He reports that he does not drink alcohol.   Family History:  The patient's family history includes Alzheimer's disease in his mother; Diabetes in his father, paternal grandfather, and paternal grandmother.    ROS:  Please see the history of present illness.   Otherwise, review of systems is positive for chest pain, palpitations.   All other systems are reviewed and negative.    PHYSICAL EXAM: VS:  BP 116/90 mmHg  Pulse 69  Ht 5'  11" (1.803 m)  Wt 190 lb 9.6 oz (86.456 kg)  BMI 26.60 kg/m2 , BMI Body mass index is 26.6 kg/(m^2). GEN: Well nourished, well developed, in no acute distress HEENT: normal Neck: no JVD, carotid bruits, or masses Cardiac: RRR; no murmurs, rubs, or gallops,no edema  Respiratory:  clear to auscultation bilaterally, normal work of breathing GI: soft, nontender, nondistended, + BS MS: no deformity or atrophy Skin: warm and dry  Neuro:  Strength and sensation are intact Psych: euthymic mood, full affect  EKG:  EKG is ordered today. The ekg ordered today shows sinus rhythm, one PVC  Recent Labs: No results found for requested  labs within last 365 days.    Lipid Panel     Component Value Date/Time   CHOL  10/02/2009 0615    166        ATP III CLASSIFICATION:  <200     mg/dL   Desirable  161-096  mg/dL   Borderline High  >=045    mg/dL   High          TRIG 33 10/02/2009 0615   HDL 62 10/02/2009 0615   CHOLHDL 2.7 10/02/2009 0615   VLDL 7 10/02/2009 0615   LDLCALC  10/02/2009 0615    97        Total Cholesterol/HDL:CHD Risk Coronary Heart Disease Risk Table                     Men   Women  1/2 Average Risk   3.4   3.3  Average Risk       5.0   4.4  2 X Average Risk   9.6   7.1  3 X Average Risk  23.4   11.0        Use the calculated Patient Ratio above and the CHD Risk Table to determine the patient's CHD Risk.        ATP III CLASSIFICATION (LDL):  <100     mg/dL   Optimal  409-811  mg/dL   Near or Above                    Optimal  130-159  mg/dL   Borderline  914-782  mg/dL   High  >956     mg/dL   Very High     Wt Readings from Last 3 Encounters:  05/17/15 190 lb 9.6 oz (86.456 kg)     ASSESSMENT AND PLAN:  1.  PVCs: uncertain as to the number of PVCs.  Will place 48 hour monitor to determine if he requires treatment.  2. Chest pain: uncertain if this is cardiac.  Patient does say that he had a workup.  Will order myoview to determine if he has cardiac causes of his pain.  He is to have knee surgery in the future.  If his stress test is low risk, he would be at low risk for an intermediate risk procedure.    Current medicines are reviewed at length with the patient today.   The patient does not have concerns regarding his medicines.  The following changes were made today:  none  Labs/ tests ordered today include:  Orders Placed This Encounter  Procedures  . Holter monitor - 48 hour  . Myocardial Perfusion Imaging  . EKG 12-Lead     Disposition:   FU with Will Camnitz as needed pending results of monitor  Signed, Will Jorja Loa, MD  05/17/2015 2:04 PM  [facility] [address] [place] [address] [phone] (office) [phone] (fax)

## 2015-05-21 ENCOUNTER — Telehealth (HOSPITAL_COMMUNITY): Payer: Self-pay | Admitting: *Deleted

## 2015-05-21 NOTE — Telephone Encounter (Signed)
Patient given detailed instructions per Myocardial Perfusion Study Information Sheet for the test on 05/23/15 at 745. Patient notified to arrive 15 minutes early and that it is imperative to arrive on time for appointment to keep from having the test rescheduled.  If you need to cancel or reschedule your appointment, please call the office within 24 hours of your appointment. Failure to do so may result in a cancellation of your appointment, and a $50 no show fee. Patient verbalized understanding. Antionette CharMary J Zailah Zagami, RN

## 2015-05-23 ENCOUNTER — Ambulatory Visit (HOSPITAL_COMMUNITY): Payer: BC Managed Care – PPO | Attending: Cardiology

## 2015-05-23 ENCOUNTER — Ambulatory Visit (INDEPENDENT_AMBULATORY_CARE_PROVIDER_SITE_OTHER): Payer: BC Managed Care – PPO

## 2015-05-23 DIAGNOSIS — I493 Ventricular premature depolarization: Secondary | ICD-10-CM | POA: Diagnosis not present

## 2015-05-23 DIAGNOSIS — R9439 Abnormal result of other cardiovascular function study: Secondary | ICD-10-CM | POA: Diagnosis not present

## 2015-05-23 DIAGNOSIS — I10 Essential (primary) hypertension: Secondary | ICD-10-CM | POA: Insufficient documentation

## 2015-05-23 DIAGNOSIS — R079 Chest pain, unspecified: Secondary | ICD-10-CM | POA: Diagnosis not present

## 2015-05-23 DIAGNOSIS — G4440 Drug-induced headache, not elsewhere classified, not intractable: Secondary | ICD-10-CM

## 2015-05-23 DIAGNOSIS — R0602 Shortness of breath: Secondary | ICD-10-CM

## 2015-05-23 LAB — MYOCARDIAL PERFUSION IMAGING
LV dias vol: 82 mL
LV sys vol: 37 mL
Peak HR: 91 {beats}/min
RATE: 0.15
Rest HR: 65 {beats}/min
SDS: 6
SRS: 2
SSS: 8
TID: 0.84

## 2015-05-23 MED ORDER — TECHNETIUM TC 99M SESTAMIBI GENERIC - CARDIOLITE
32.2000 | Freq: Once | INTRAVENOUS | Status: AC | PRN
  Administered 2015-05-23: 32.2 via INTRAVENOUS

## 2015-05-23 MED ORDER — TECHNETIUM TC 99M SESTAMIBI GENERIC - CARDIOLITE
10.9000 | Freq: Once | INTRAVENOUS | Status: AC | PRN
  Administered 2015-05-23: 10.9 via INTRAVENOUS

## 2015-05-23 MED ORDER — AMINOPHYLLINE 25 MG/ML IV SOLN
75.0000 mg | Freq: Once | INTRAVENOUS | Status: AC
Start: 2015-05-23 — End: 2015-05-23
  Administered 2015-05-23: 75 mg via INTRAVENOUS

## 2015-05-23 MED ORDER — REGADENOSON 0.4 MG/5ML IV SOLN
0.4000 mg | Freq: Once | INTRAVENOUS | Status: AC
  Administered 2015-05-23: 0.4 mg via INTRAVENOUS

## 2015-05-30 ENCOUNTER — Encounter: Payer: Self-pay | Admitting: *Deleted

## 2015-05-30 MED ORDER — METOPROLOL TARTRATE 50 MG PO TABS: 50.0000 mg | ORAL_TABLET | Freq: Two times a day (BID) | ORAL | Status: DC

## 2015-05-30 NOTE — Patient Instructions (Signed)
Reviewed medication instructions with patient. Advised to call office with any new symptoms that may arise after starting medication.

## 2015-06-07 ENCOUNTER — Telehealth: Payer: Self-pay | Admitting: Cardiology

## 2015-06-07 NOTE — Telephone Encounter (Signed)
Patient reports extreme leg swelling since beginning Metoprolol on 05/30/15.   Discussed with patient that per agreement that day he was going to hold off on starting this medication (this was his preference) until holter results were reviewed - but patient began taking med anyway. Advised patient to stop medication and I would have Dr Elberta Fortisamnitz review holter monitor results tomorrow and would call him with results/recommendations. Patient also concerned with getting surgical clearance for his knee ASAP as he cannot work.  Explained that I would review this with Dr. Elberta Fortisamnitz also. Patient aware I will call him tomorrow and is agreeable to above plan.

## 2015-06-07 NOTE — Telephone Encounter (Signed)
New problem   Pt need to know results of NM test he had done 12.21.16. Please advise

## 2015-06-08 ENCOUNTER — Encounter (HOSPITAL_COMMUNITY): Payer: Self-pay | Admitting: *Deleted

## 2015-06-08 ENCOUNTER — Emergency Department (HOSPITAL_COMMUNITY): Payer: BC Managed Care – PPO

## 2015-06-08 ENCOUNTER — Emergency Department (HOSPITAL_COMMUNITY)
Admission: EM | Admit: 2015-06-08 | Discharge: 2015-06-08 | Disposition: A | Discharge status: Home / Self Care | Payer: BC Managed Care – PPO | Attending: Emergency Medicine | Admitting: Emergency Medicine

## 2015-06-08 DIAGNOSIS — R0602 Shortness of breath: Secondary | ICD-10-CM | POA: Diagnosis not present

## 2015-06-08 DIAGNOSIS — Z791 Long term (current) use of non-steroidal anti-inflammatories (NSAID): Secondary | ICD-10-CM | POA: Insufficient documentation

## 2015-06-08 DIAGNOSIS — Z79899 Other long term (current) drug therapy: Secondary | ICD-10-CM | POA: Insufficient documentation

## 2015-06-08 DIAGNOSIS — R600 Localized edema: Secondary | ICD-10-CM | POA: Diagnosis not present

## 2015-06-08 DIAGNOSIS — R001 Bradycardia, unspecified: Secondary | ICD-10-CM | POA: Diagnosis not present

## 2015-06-08 DIAGNOSIS — Z87891 Personal history of nicotine dependence: Secondary | ICD-10-CM | POA: Insufficient documentation

## 2015-06-08 DIAGNOSIS — E119 Type 2 diabetes mellitus without complications: Secondary | ICD-10-CM | POA: Diagnosis not present

## 2015-06-08 DIAGNOSIS — Z7984 Long term (current) use of oral hypoglycemic drugs: Secondary | ICD-10-CM | POA: Diagnosis not present

## 2015-06-08 DIAGNOSIS — I10 Essential (primary) hypertension: Secondary | ICD-10-CM | POA: Diagnosis not present

## 2015-06-08 DIAGNOSIS — R2243 Localized swelling, mass and lump, lower limb, bilateral: Secondary | ICD-10-CM | POA: Diagnosis present

## 2015-06-08 LAB — CBC
HCT: 35 % — ABNORMAL LOW (ref 39.0–52.0)
Hemoglobin: 11.6 g/dL — ABNORMAL LOW (ref 13.0–17.0)
MCH: 25.8 pg — ABNORMAL LOW (ref 26.0–34.0)
MCHC: 33.1 g/dL (ref 30.0–36.0)
MCV: 78 fL (ref 78.0–100.0)
Platelets: 309 10*3/uL (ref 150–400)
RBC: 4.49 MIL/uL (ref 4.22–5.81)
RDW: 14.6 % (ref 11.5–15.5)
WBC: 7.8 10*3/uL (ref 4.0–10.5)

## 2015-06-08 LAB — BASIC METABOLIC PANEL
Anion gap: 9 (ref 5–15)
BUN: 17 mg/dL (ref 6–20)
CO2: 27 mmol/L (ref 22–32)
Calcium: 9.5 mg/dL (ref 8.9–10.3)
Chloride: 108 mmol/L (ref 101–111)
Creatinine, Ser: 1.32 mg/dL — ABNORMAL HIGH (ref 0.61–1.24)
GFR calc Af Amer: >60 mL/min (ref >60)
GFR calc non Af Amer: 56 mL/min — ABNORMAL LOW (ref >60)
Glucose, Bld: 90 mg/dL (ref 65–99)
Potassium: 4.4 mmol/L (ref 3.5–5.1)
Sodium: 144 mmol/L (ref 135–145)

## 2015-06-08 LAB — I-STAT TROPONIN, ED: Troponin i, poc: 0.01 ng/mL (ref 0.00–0.08)

## 2015-06-08 LAB — BRAIN NATRIURETIC PEPTIDE: B Natriuretic Peptide: 492.4 pg/mL — ABNORMAL HIGH (ref 0.0–100.0)

## 2015-06-08 MED ORDER — METOPROLOL TARTRATE 25 MG PO TABS: 12.5000 mg | ORAL_TABLET | Freq: Two times a day (BID) | ORAL | Status: DC

## 2015-06-08 NOTE — Telephone Encounter (Signed)
Dr. Elberta Fortisamnitz reviewed holter monitor results.  Patient cleared for knee surgery.  Asked patient to have orthopedic send clearance request to our office. Advised patient to decrease Metoprolol to 12.5 mg BID to see if swelling improves.  Informed that if this does not work then we will consider switching to another medication. [facility] Informed patient that we would see him back in office in 3 months - appt made for 09/12/15. Patient verbalized understanding and agreeable to plan.

## 2015-06-08 NOTE — Discharge Instructions (Signed)
Edema °Edema is an abnormal buildup of fluids in your body tissues. Edema is somewhat dependent on gravity to pull the fluid to the lowest place in your body. That makes the condition more common in the legs and thighs (lower extremities). Painless swelling of the feet and ankles is common and becomes more likely as you get older. It is also common in looser tissues, like around your eyes.  °When the affected area is squeezed, the fluid may move out of that spot and leave a dent for a few moments. This dent is called pitting.  °CAUSES  °There are many possible causes of edema. Eating too much salt and being on your feet or sitting for a long time can cause edema in your legs and ankles. Hot weather may make edema worse. Common medical causes of edema include: °· Heart failure. °· Liver disease. °· Kidney disease. °· Weak blood vessels in your legs. °· Cancer. °· An injury. °· Pregnancy. °· Some medications. °· Obesity.  °SYMPTOMS  °Edema is usually painless. Your skin may look swollen or shiny.  °DIAGNOSIS  °Your health care provider may be able to diagnose edema by asking about your medical history and doing a physical exam. You may need to have tests such as X-rays, an electrocardiogram, or blood tests to check for medical conditions that may cause edema.  °TREATMENT  °Edema treatment depends on the cause. If you have heart, liver, or kidney disease, you need the treatment appropriate for these conditions. General treatment may include: °· Elevation of the affected body part above the level of your heart. °· Compression of the affected body part. Pressure from elastic bandages or support stockings squeezes the tissues and forces fluid back into the blood vessels. This keeps fluid from entering the tissues. °· Restriction of fluid and salt intake. °· Use of a water pill (diuretic). These medications are appropriate only for some types of edema. They pull fluid out of your body and make you urinate more often. This  gets rid of fluid and reduces swelling, but diuretics can have side effects. Only use diuretics as directed by your health care provider. °HOME CARE INSTRUCTIONS  °· Keep the affected body part above the level of your heart when you are lying down.   °· Do not sit still or stand for prolonged periods.   °· Do not put anything directly under your knees when lying down. °· Do not wear constricting clothing or garters on your upper legs.   °· Exercise your legs to work the fluid back into your blood vessels. This may help the swelling go down.   °· Wear elastic bandages or support stockings to reduce ankle swelling as directed by your health care provider.   °· Eat a low-salt diet to reduce fluid if your health care provider recommends it.   °· Only take medicines as directed by your health care provider.  °SEEK MEDICAL CARE IF:  °· Your edema is not responding to treatment. °· You have heart, liver, or kidney disease and notice symptoms of edema. °· You have edema in your legs that does not improve after elevating them.   °· You have sudden and unexplained weight gain. °SEEK IMMEDIATE MEDICAL CARE IF:  °· You develop shortness of breath or chest pain.   °· You cannot breathe when you lie down. °· You develop pain, redness, or warmth in the swollen areas.   °· You have heart, liver, or kidney disease and suddenly get edema. °· You have a fever and your symptoms suddenly get worse. °MAKE SURE YOU:  °·   Understand these instructions. °· Will watch your condition. °· Will get help right away if you are not doing well or get worse. °  °This information is not intended to replace advice given to you by your health care provider. Make sure you discuss any questions you have with your health care provider. °  °Document Released: 05/19/2005 Document Revised: 06/09/2014 Document Reviewed: 03/11/2013 °Elsevier Interactive Patient Education ©2016 [employer] ° °

## 2015-06-08 NOTE — ED Provider Notes (Signed)
CSN: [id number]     Arrival date & time 06/08/15  1701 History   First MD Initiated Contact with Patient 06/08/15 1932     Chief Complaint  Patient presents with  . Shortness of Breath  . Leg Swelling   65 yo M w/PMH of HTN, DM, and knee pain who presents w/1 week of feet and ankle swelling. He was sent over by his PCP for an abnml ekg. Pt endorses he had been being worked up for pre-op clearance for a knee surgery that is upcoming. For this he has had a recent nuclear medicine stress test which was normal and had worn a holter monitor for some presyncopal symptoms. This had shown several PVCs which were becoming symptomatic for the pt. Therefore, he had recently been started on metoprolol (25mg  daily). He has noticed that after starting the metoprolol, his lower legs became swollen and painful. He's also felt like he's been wheezing for about a week, as well. He does feel like the leg swelling has improved over the past week.  He denies CP, SOB, orthopnea, DOE, fever, chills, N/V, diarrhea, constipation, hematemesis, dysuria, hematuria, sick contacts, or recent travel.   (Consider location/radiation/quality/duration/timing/severity/associated sxs/prior Treatment) Patient is a 65 y.o. male presenting with leg pain.  Leg Pain Location:  Leg and ankle Time since incident:  1 week Injury: no   Pain details:    Quality:  Aching (swelling)   Radiates to:  Does not radiate   Severity:  Mild   Onset quality:  Sudden Chronicity:  New Relieved by:  Nothing Associated symptoms: swelling   Associated symptoms: no fever      Past Medical History  Diagnosis Date  . Hypertension   . Seizures (HCC)   . Diabetes mellitus without complication [facility])    Past Surgical History  Procedure Laterality Date  . Knee surgery    . Cholecystectomy     Family History  Problem Relation Age of Onset  . Alzheimer's disease Mother   . Diabetes Father   . Diabetes Paternal Grandmother   . Diabetes Paternal  Grandfather    Social History  Substance Use Topics  . Smoking status: Former Smoker    Quit date: 06/02/1988  . Smokeless tobacco: None  . Alcohol Use: No    Review of Systems  Constitutional: Negative for fever and chills.  Respiratory: Negative for shortness of breath.   Cardiovascular: Negative for chest pain, palpitations and leg swelling.  Gastrointestinal: Negative for nausea, vomiting, abdominal pain, diarrhea, constipation and abdominal distention.  Genitourinary: Negative for dysuria, frequency, flank pain and decreased urine volume.  Musculoskeletal:       Leg swelling  Neurological: Negative for dizziness, speech difficulty, light-headedness and headaches.  All other systems reviewed and are negative.     Allergies  Review of patient's allergies indicates no known allergies.  Home Medications   Prior to Admission medications   Medication Sig Start Date End Date Taking? Authorizing Provider  diclofenac sodium (VOLTAREN) 1 % GEL Apply 1 application topically 2 (two) times daily as needed (pain).   Yes Historical Provider, MD  etodolac (LODINE) 400 MG tablet Take 400 mg by mouth 2 (two) times daily. 05/05/15  Yes Historical Provider, MD  FeFum-FePoly-FA-B Cmp-C-Biot (INTEGRA PLUS) CAPS Take 1 capsule by mouth 2 (two) times daily.  05/16/15  Yes Historical Provider, MD  ibuprofen (ADVIL,MOTRIN) 200 MG tablet Take 800 mg by mouth 2 (two) times daily as needed (pain).   Yes Historical Provider, MD  lamoTRIgine (LAMICTAL) 100 MG tablet Take 100 mg by mouth 2 (two) times daily.    Yes Historical Provider, MD  LORazepam (ATIVAN) 0.5 MG tablet Take 1 mg by mouth See admin instructions. Take 2 tablets (1 mg) by mouth daily at bedtime, may also take 1 tablet (0.5 mg) in the morning as needed for anxiety   Yes Historical Provider, MD  losartan-hydrochlorothiazide (HYZAAR) 100-25 MG per tablet Take 1 tablet by mouth daily.   Yes Historical Provider, MD  metFORMIN (GLUCOPHAGE-XR)  500 MG 24 hr tablet Take 1,500 mg by mouth at bedtime. 05/03/15  Yes Historical Provider, MD  naproxen (NAPROSYN) 500 MG tablet Take 500 mg by mouth 2 (two) times daily. 03/21/15  Yes Historical Provider, MD  potassium chloride SA (K-DUR,KLOR-CON) 20 MEQ tablet Take 20 mEq by mouth at bedtime.    Yes Historical Provider, MD  Vitamin D, Ergocalciferol, (DRISDOL) 50000 UNITS CAPS capsule Take 50,000 Units by mouth once a week. On Sundays 05/05/15  Yes Historical Provider, MD   BP 160/88 mmHg  Pulse 48  Temp(Src) 97.3 F (36.3 C) (Oral)  Resp 21  Ht 5\' 11"  (1.803 m)  Wt 91.627 kg  BMI 28.19 kg/m2  SpO2 98% Physical Exam  Constitutional: He is oriented to person, place, and time. He appears well-developed and well-nourished. No distress.  HENT:  Head: Normocephalic and atraumatic.  Eyes: Pupils are equal, round, and reactive to light.  Neck: Normal range of motion.  Cardiovascular: Regular rhythm, normal heart sounds and intact distal pulses.  Exam reveals no gallop and no friction rub.   No murmur heard. brady  Pulmonary/Chest: Effort normal and breath sounds normal. No respiratory distress. He has no wheezes. He has no rales. He exhibits no tenderness.  Abdominal: Soft. Bowel sounds are normal. He exhibits no distension and no mass. There is no tenderness. There is no rebound and no guarding.  Musculoskeletal: Normal range of motion. He exhibits edema (bilateral LE edema, 1+ to lower shins).  Lymphadenopathy:    He has no cervical adenopathy.  Neurological: He is alert and oriented to person, place, and time. No cranial nerve deficit. Coordination normal.  Skin: Skin is warm and dry. No rash noted. He is not diaphoretic.  Nursing note and vitals reviewed.   ED Course  Procedures (including critical care time) Labs Review Labs Reviewed  BASIC METABOLIC PANEL - Abnormal; Notable for the following:    Creatinine, Ser 1.32 (*)    GFR calc non Af Amer 56 (*)    All other components  within normal limits  CBC - Abnormal; Notable for the following:    Hemoglobin 11.6 (*)    HCT 35.0 (*)    MCH 25.8 (*)    All other components within normal limits  BRAIN NATRIURETIC PEPTIDE - Abnormal; Notable for the following:    B Natriuretic Peptide 492.4 (*)    All other components within normal limits  I-STAT TROPOININ, ED    Imaging Review Dg Chest 2 View  06/08/2015  CLINICAL DATA:  Abnormal EKG. EXAM: CHEST  2 VIEW COMPARISON:  03/08/2012 FINDINGS: Normal mediastinum and cardiac silhouette. Normal pulmonary vasculature. No evidence of effusion, infiltrate, or pneumothorax. No acute bony abnormality. IMPRESSION: No acute cardiopulmonary process. Electronically Signed   By: Genevive BiStewart  Edmunds M.D.   On: 06/08/2015 17:47   I have personally reviewed and evaluated these images and lab results as part of my medical decision-making.   EKG Interpretation   Date/Time:  Friday June 08 2015 17:16:55 EST Ventricular Rate:  48 PR Interval:  156 QRS Duration: 94 QT Interval:  426 QTC Calculation: 380 R Axis:   38 Text Interpretation:  Sinus bradycardia T wave abnormality, consider  inferior ischemia Abnormal ECG biphasic t waves in anterior leads.   Flipped T waves in lead III, and aVF.  Confirmed by Adela Lank MD, Reuel Boom  [phone]) on 06/08/2015 8:29:39 PM      MDM   Final diagnoses:  Bilateral leg edema   65 yo AAM w/bilateral LE edema. See HPI for details. On exam, NAD, AFVSS. No JVD. Lungs clear: no sign of asthma, copd, CHF.  HE does have bilateral 1+ pitting edema in feet and ankles up to lower shins. Not c/w DVT.   Labs show elevated Cr (1.32). No recent priors to compare. His last comparison is from 2011 and was 0.9. EKG shows sinus bradycardia (48) w/TWI in III and aVF. Trop wnl x2. Not c/w ACS. Perhaps new onset CHF? But if so, hesitant to start lasix given elevated Cr. Discussed with cardiologist: recent nuclear stress test showed normal LV EF: therefore, not c/w CHF. Feels  more c/w with a low flow state from bradycardia. Recommend DCing the metoprolol to improve this. No other acute interventions necessary.   As pt remains well appearing, stable for DC home. FU to PCP on Monday for re-eval. Strict return to ED if any CP or SOB.   Pt was seen under the supervision of Dr. Adela Lank.     Rachelle Hora, MD 06/09/15 1530  Melene Plan, DO 06/09/15 2120

## 2015-06-08 NOTE — ED Notes (Signed)
Pt reports going to pcp today due to leg swelling and sob with exertion. Pt sent here due to abnormal ekg. HR 48 at triage. No resp distress noted.

## 2015-06-09 NOTE — ED Provider Notes (Signed)
I have personally seen and examined the patient.  I have discussed the plan of care with the resident.  I have reviewed the documentation on PMH/FH/Soc. History.  I have reviewed the documentation of the resident and agree.   EKG Interpretation  Date/Time:  Friday June 08 2015 17:16:55 EST Ventricular Rate:  48 PR Interval:  156 QRS Duration: 94 QT Interval:  426 QTC Calculation: 380 R Axis:   38 Text Interpretation:  Sinus bradycardia T wave abnormality, consider inferior ischemia Abnormal ECG biphasic t waves in anterior leads.  Flipped T waves in lead III, and aVF.  Confirmed by Adela LankFLOYD MD, Reuel BoomANIEL [phone] on 06/08/2015 8:29:39 PM       65 yo M with a chief complaints of weakness and leg swelling. Patient is weak when he gets up to move around. Leg swelling is worse over the past couple days after starting metoprolol. Patient found to have heart rate in the 40s, with multiple PVCs that were not perfusing. Mild elevation of his BNP as well as an history of present illness. We'll discuss with cardiology.  Melene Planan Johnathan Heskett, DO 06/09/15 2120

## 2015-06-10 ENCOUNTER — Inpatient Hospital Stay (HOSPITAL_COMMUNITY)
Admission: EM | Admit: 2015-06-10 | Discharge: 2015-06-27 | DRG: 020 | Disposition: A | Discharge status: Rehabilitation Facility | Payer: BC Managed Care – PPO | Attending: Neurosurgery | Admitting: Neurosurgery

## 2015-06-10 ENCOUNTER — Encounter (HOSPITAL_COMMUNITY): Payer: Self-pay

## 2015-06-10 ENCOUNTER — Emergency Department (HOSPITAL_COMMUNITY): Payer: BC Managed Care – PPO

## 2015-06-10 ENCOUNTER — Emergency Department (HOSPITAL_COMMUNITY)
Admission: EM | Admit: 2015-06-10 | Discharge: 2015-06-10 | Disposition: A | Discharge status: Home / Self Care | Payer: BC Managed Care – PPO | Source: Home / Self Care | Attending: Emergency Medicine | Admitting: Emergency Medicine

## 2015-06-10 ENCOUNTER — Encounter (HOSPITAL_COMMUNITY): Payer: Self-pay | Admitting: Emergency Medicine

## 2015-06-10 DIAGNOSIS — I493 Ventricular premature depolarization: Secondary | ICD-10-CM | POA: Diagnosis present

## 2015-06-10 DIAGNOSIS — R4189 Other symptoms and signs involving cognitive functions and awareness: Secondary | ICD-10-CM | POA: Diagnosis present

## 2015-06-10 DIAGNOSIS — F502 Bulimia nervosa: Secondary | ICD-10-CM | POA: Diagnosis not present

## 2015-06-10 DIAGNOSIS — K567 Ileus, unspecified: Secondary | ICD-10-CM | POA: Diagnosis not present

## 2015-06-10 DIAGNOSIS — R519 Headache, unspecified: Secondary | ICD-10-CM

## 2015-06-10 DIAGNOSIS — R109 Unspecified abdominal pain: Secondary | ICD-10-CM

## 2015-06-10 DIAGNOSIS — G40909 Epilepsy, unspecified, not intractable, without status epilepticus: Secondary | ICD-10-CM

## 2015-06-10 DIAGNOSIS — R202 Paresthesia of skin: Secondary | ICD-10-CM

## 2015-06-10 DIAGNOSIS — R51 Headache: Secondary | ICD-10-CM | POA: Insufficient documentation

## 2015-06-10 DIAGNOSIS — R001 Bradycardia, unspecified: Secondary | ICD-10-CM | POA: Diagnosis present

## 2015-06-10 DIAGNOSIS — K5900 Constipation, unspecified: Secondary | ICD-10-CM | POA: Diagnosis not present

## 2015-06-10 DIAGNOSIS — R092 Respiratory arrest: Secondary | ICD-10-CM | POA: Diagnosis present

## 2015-06-10 DIAGNOSIS — D649 Anemia, unspecified: Secondary | ICD-10-CM | POA: Diagnosis present

## 2015-06-10 DIAGNOSIS — M25561 Pain in right knee: Secondary | ICD-10-CM | POA: Diagnosis present

## 2015-06-10 DIAGNOSIS — G8191 Hemiplegia, unspecified affecting right dominant side: Secondary | ICD-10-CM | POA: Diagnosis present

## 2015-06-10 DIAGNOSIS — Z87891 Personal history of nicotine dependence: Secondary | ICD-10-CM | POA: Diagnosis not present

## 2015-06-10 DIAGNOSIS — E119 Type 2 diabetes mellitus without complications: Secondary | ICD-10-CM | POA: Insufficient documentation

## 2015-06-10 DIAGNOSIS — R112 Nausea with vomiting, unspecified: Secondary | ICD-10-CM

## 2015-06-10 DIAGNOSIS — Z79899 Other long term (current) drug therapy: Secondary | ICD-10-CM | POA: Insufficient documentation

## 2015-06-10 DIAGNOSIS — G919 Hydrocephalus, unspecified: Secondary | ICD-10-CM | POA: Diagnosis present

## 2015-06-10 DIAGNOSIS — R1310 Dysphagia, unspecified: Secondary | ICD-10-CM | POA: Diagnosis present

## 2015-06-10 DIAGNOSIS — R402212 Coma scale, best verbal response, none, at arrival to emergency department: Secondary | ICD-10-CM | POA: Diagnosis present

## 2015-06-10 DIAGNOSIS — R140 Abdominal distension (gaseous): Secondary | ICD-10-CM | POA: Diagnosis not present

## 2015-06-10 DIAGNOSIS — R402312 Coma scale, best motor response, none, at arrival to emergency department: Secondary | ICD-10-CM | POA: Diagnosis present

## 2015-06-10 DIAGNOSIS — I6051 Nontraumatic subarachnoid hemorrhage from right vertebral artery: Principal | ICD-10-CM | POA: Diagnosis present

## 2015-06-10 DIAGNOSIS — G9340 Encephalopathy, unspecified: Secondary | ICD-10-CM | POA: Diagnosis not present

## 2015-06-10 DIAGNOSIS — E118 Type 2 diabetes mellitus with unspecified complications: Secondary | ICD-10-CM | POA: Diagnosis not present

## 2015-06-10 DIAGNOSIS — I129 Hypertensive chronic kidney disease with stage 1 through stage 4 chronic kidney disease, or unspecified chronic kidney disease: Secondary | ICD-10-CM | POA: Diagnosis present

## 2015-06-10 DIAGNOSIS — I10 Essential (primary) hypertension: Secondary | ICD-10-CM

## 2015-06-10 DIAGNOSIS — R402142 Coma scale, eyes open, spontaneous, at arrival to emergency department: Secondary | ICD-10-CM | POA: Diagnosis present

## 2015-06-10 DIAGNOSIS — I609 Nontraumatic subarachnoid hemorrhage, unspecified: Secondary | ICD-10-CM | POA: Diagnosis not present

## 2015-06-10 DIAGNOSIS — D62 Acute posthemorrhagic anemia: Secondary | ICD-10-CM | POA: Diagnosis not present

## 2015-06-10 DIAGNOSIS — Z781 Physical restraint status: Secondary | ICD-10-CM | POA: Diagnosis not present

## 2015-06-10 DIAGNOSIS — I472 Ventricular tachycardia: Secondary | ICD-10-CM | POA: Diagnosis present

## 2015-06-10 DIAGNOSIS — D72829 Elevated white blood cell count, unspecified: Secondary | ICD-10-CM | POA: Diagnosis present

## 2015-06-10 DIAGNOSIS — E876 Hypokalemia: Secondary | ICD-10-CM | POA: Diagnosis not present

## 2015-06-10 DIAGNOSIS — I82402 Acute embolism and thrombosis of unspecified deep veins of left lower extremity: Secondary | ICD-10-CM | POA: Diagnosis not present

## 2015-06-10 DIAGNOSIS — Z791 Long term (current) use of non-steroidal anti-inflammatories (NSAID): Secondary | ICD-10-CM

## 2015-06-10 DIAGNOSIS — R4781 Slurred speech: Secondary | ICD-10-CM | POA: Diagnosis present

## 2015-06-10 DIAGNOSIS — Z7984 Long term (current) use of oral hypoglycemic drugs: Secondary | ICD-10-CM

## 2015-06-10 DIAGNOSIS — R1110 Vomiting, unspecified: Secondary | ICD-10-CM | POA: Insufficient documentation

## 2015-06-10 DIAGNOSIS — R638 Other symptoms and signs concerning food and fluid intake: Secondary | ICD-10-CM | POA: Diagnosis not present

## 2015-06-10 DIAGNOSIS — I82412 Acute embolism and thrombosis of left femoral vein: Secondary | ICD-10-CM | POA: Diagnosis not present

## 2015-06-10 DIAGNOSIS — Z659 Problem related to unspecified psychosocial circumstances: Secondary | ICD-10-CM | POA: Diagnosis not present

## 2015-06-10 DIAGNOSIS — R52 Pain, unspecified: Secondary | ICD-10-CM | POA: Diagnosis not present

## 2015-06-10 DIAGNOSIS — E1122 Type 2 diabetes mellitus with diabetic chronic kidney disease: Secondary | ICD-10-CM | POA: Diagnosis present

## 2015-06-10 DIAGNOSIS — J988 Other specified respiratory disorders: Secondary | ICD-10-CM | POA: Diagnosis not present

## 2015-06-10 DIAGNOSIS — G8929 Other chronic pain: Secondary | ICD-10-CM | POA: Diagnosis present

## 2015-06-10 DIAGNOSIS — N182 Chronic kidney disease, stage 2 (mild): Secondary | ICD-10-CM | POA: Diagnosis present

## 2015-06-10 DIAGNOSIS — G936 Cerebral edema: Secondary | ICD-10-CM | POA: Diagnosis present

## 2015-06-10 DIAGNOSIS — R4587 Impulsiveness: Secondary | ICD-10-CM | POA: Diagnosis not present

## 2015-06-10 DIAGNOSIS — I69919 Unspecified symptoms and signs involving cognitive functions following unspecified cerebrovascular disease: Secondary | ICD-10-CM | POA: Diagnosis not present

## 2015-06-10 DIAGNOSIS — R5383 Other fatigue: Secondary | ICD-10-CM | POA: Diagnosis not present

## 2015-06-10 DIAGNOSIS — R410 Disorientation, unspecified: Secondary | ICD-10-CM | POA: Diagnosis not present

## 2015-06-10 LAB — DIFFERENTIAL
Basophils Absolute: 0 10*3/uL (ref 0.0–0.1)
Basophils Relative: 0 %
Eosinophils Absolute: 0.7 10*3/uL (ref 0.0–0.7)
Eosinophils Relative: 6 %
Lymphocytes Relative: 39 %
Lymphs Abs: 4.1 10*3/uL — ABNORMAL HIGH (ref 0.7–4.0)
Monocytes Absolute: 0.9 10*3/uL (ref 0.1–1.0)
Monocytes Relative: 8 %
Neutro Abs: 5 10*3/uL (ref 1.7–7.7)
Neutrophils Relative %: 47 %

## 2015-06-10 LAB — RAPID URINE DRUG SCREEN, HOSP PERFORMED
Amphetamines: NOT DETECTED
Barbiturates: NOT DETECTED
Benzodiazepines: NOT DETECTED
Cocaine: NOT DETECTED
Opiates: NOT DETECTED
Tetrahydrocannabinol: NOT DETECTED

## 2015-06-10 LAB — COMPREHENSIVE METABOLIC PANEL
ALT: 21 U/L (ref 17–63)
AST: 49 U/L — ABNORMAL HIGH (ref 15–41)
Albumin: 3.8 g/dL (ref 3.5–5.0)
Alkaline Phosphatase: 50 U/L (ref 38–126)
Anion gap: 19 — ABNORMAL HIGH (ref 5–15)
BUN: 16 mg/dL (ref 6–20)
CO2: 16 mmol/L — ABNORMAL LOW (ref 22–32)
Calcium: 10.1 mg/dL (ref 8.9–10.3)
Chloride: 109 mmol/L (ref 101–111)
Creatinine, Ser: 1.55 mg/dL — ABNORMAL HIGH (ref 0.61–1.24)
GFR calc Af Amer: 53 mL/min — ABNORMAL LOW (ref >60)
GFR calc non Af Amer: 46 mL/min — ABNORMAL LOW (ref >60)
Glucose, Bld: 170 mg/dL — ABNORMAL HIGH (ref 65–99)
Potassium: 4.5 mmol/L (ref 3.5–5.1)
Sodium: 144 mmol/L (ref 135–145)
Total Bilirubin: 0.9 mg/dL (ref 0.3–1.2)
Total Protein: 6.4 g/dL — ABNORMAL LOW (ref 6.5–8.1)

## 2015-06-10 LAB — URINALYSIS, ROUTINE W REFLEX MICROSCOPIC
Glucose, UA: NEGATIVE mg/dL
Ketones, ur: NEGATIVE mg/dL
Nitrite: NEGATIVE
Protein, ur: 100 mg/dL — AB
Specific Gravity, Urine: 1.016 (ref 1.005–1.030)
pH: 6 (ref 5.0–8.0)

## 2015-06-10 LAB — CBC
HCT: 36.8 % — ABNORMAL LOW (ref 39.0–52.0)
Hemoglobin: 12.8 g/dL — ABNORMAL LOW (ref 13.0–17.0)
MCH: 26.5 pg (ref 26.0–34.0)
MCHC: 34.8 g/dL (ref 30.0–36.0)
MCV: 76.2 fL — ABNORMAL LOW (ref 78.0–100.0)
Platelets: 302 10*3/uL (ref 150–400)
RBC: 4.83 MIL/uL (ref 4.22–5.81)
RDW: 14.4 % (ref 11.5–15.5)
WBC: 10.7 10*3/uL — ABNORMAL HIGH (ref 4.0–10.5)

## 2015-06-10 LAB — I-STAT CHEM 8, ED
BUN: 18 mg/dL (ref 6–20)
Calcium, Ion: 1.19 mmol/L (ref 1.13–1.30)
Chloride: 107 mmol/L (ref 101–111)
Creatinine, Ser: 1.4 mg/dL — ABNORMAL HIGH (ref 0.61–1.24)
Glucose, Bld: 168 mg/dL — ABNORMAL HIGH (ref 65–99)
HCT: 44 % (ref 39.0–52.0)
Hemoglobin: 15 g/dL (ref 13.0–17.0)
Potassium: 3.1 mmol/L — ABNORMAL LOW (ref 3.5–5.1)
Sodium: 143 mmol/L (ref 135–145)
TCO2: 22 mmol/L (ref 0–100)

## 2015-06-10 LAB — I-STAT TROPONIN, ED: Troponin i, poc: 0.01 ng/mL (ref 0.00–0.08)

## 2015-06-10 LAB — ETHANOL: Alcohol, Ethyl (B): <5 mg/dL (ref <5)

## 2015-06-10 LAB — URINE MICROSCOPIC-ADD ON: Bacteria, UA: NONE SEEN

## 2015-06-10 LAB — PROTIME-INR
INR: 1.16 (ref 0.00–1.49)
Prothrombin Time: 15 seconds (ref 11.6–15.2)

## 2015-06-10 LAB — APTT: aPTT: 24 seconds (ref 24–37)

## 2015-06-10 MED ORDER — MIDAZOLAM HCL 2 MG/2ML IJ SOLN
INTRAMUSCULAR | Status: AC
  Filled 2015-06-10: qty 6

## 2015-06-10 MED ORDER — ETOMIDATE 2 MG/ML IV SOLN
20.0000 mg | Freq: Once | INTRAVENOUS | Status: AC
  Administered 2015-06-10: 20 mg via INTRAVENOUS

## 2015-06-10 MED ORDER — IOHEXOL 350 MG/ML SOLN
80.0000 mL | Freq: Once | INTRAVENOUS | Status: AC | PRN
  Administered 2015-06-10: 100 mL via INTRAVENOUS

## 2015-06-10 MED ORDER — ACETAMINOPHEN 325 MG PO TABS
650.0000 mg | ORAL_TABLET | Freq: Once | ORAL | Status: AC
Start: 2015-06-10 — End: 2015-06-10
  Administered 2015-06-10: 650 mg via ORAL
  Filled 2015-06-10: qty 2

## 2015-06-10 MED ORDER — MIDAZOLAM HCL 2 MG/2ML IJ SOLN
2.0000 mg | INTRAMUSCULAR | Status: DC | PRN
  Administered 2015-06-10: 2 mg via INTRAVENOUS
  Administered 2015-06-10: 4 mg via INTRAVENOUS
  Administered 2015-06-11: 2 mg via INTRAVENOUS
  Filled 2015-06-10: qty 2

## 2015-06-10 MED ORDER — FENTANYL CITRATE (PF) 100 MCG/2ML IJ SOLN
50.0000 ug | INTRAMUSCULAR | Status: DC | PRN
  Administered 2015-06-10 – 2015-06-12 (×7): 50 ug via INTRAVENOUS
  Filled 2015-06-10 (×6): qty 2

## 2015-06-10 MED ORDER — FENTANYL CITRATE (PF) 100 MCG/2ML IJ SOLN
INTRAMUSCULAR | Status: AC
  Administered 2015-06-11: 50 ug via INTRAVENOUS
  Filled 2015-06-10: qty 2

## 2015-06-10 MED ORDER — IBUPROFEN 800 MG PO TABS
800.0000 mg | ORAL_TABLET | Freq: Once | ORAL | Status: AC
Start: 2015-06-10 — End: 2015-06-10
  Administered 2015-06-10: 800 mg via ORAL
  Filled 2015-06-10: qty 1

## 2015-06-10 MED ORDER — MIDAZOLAM HCL 2 MG/2ML IJ SOLN
INTRAMUSCULAR | Status: AC
  Administered 2015-06-10: 4 mg via INTRAVENOUS
  Filled 2015-06-10: qty 2

## 2015-06-10 MED ORDER — SUCCINYLCHOLINE CHLORIDE 20 MG/ML IJ SOLN
100.0000 mg | Freq: Once | INTRAMUSCULAR | Status: AC
  Administered 2015-06-10: 100 mg via INTRAVENOUS

## 2015-06-10 MED ORDER — MIDAZOLAM HCL 2 MG/2ML IJ SOLN
INTRAMUSCULAR | Status: AC
  Administered 2015-06-11: 2 mg via INTRAVENOUS
  Filled 2015-06-10: qty 2

## 2015-06-10 NOTE — ED Notes (Signed)
Pt now back from ct. nad noted. Remains on vent. Awaiting neuro surgery

## 2015-06-10 NOTE — ED Notes (Signed)
MD at bedside. Dr Wynetta Emerycram

## 2015-06-10 NOTE — Procedures (Signed)
Intubation Procedure Note Darryl [name] [id number] 31-Aug-1950  Procedure: Intubation Indications: Airway protection and maintenance  Procedure Details Consent: Risks of procedure as well as the alternatives and risks of each were explained to the (patient/caregiver).  Consent for procedure obtained. Time Out: Verified patient identification, verified procedure, site/side was marked, verified correct patient position, special equipment/implants available, medications/allergies/relevent history reviewed, required imaging and test results available.  Performed  Maximum sterile technique was used including antiseptics.  MAC    Evaluation Hemodynamic Status: BP stable throughout; O2 sats: stable throughout Patient's Current Condition: stable Complications: No apparent complications Patient did tolerate procedure well. Chest X-ray ordered to verify placement.  CXR: pending.   Darryl ChestnutReid, Skyy [name] [facility] 06/10/2015

## 2015-06-10 NOTE — H&P (Signed)
Darryl [name] is an 64 y.o. male.   Chief Complaint: Subarachnoid hemorrhage HPI: Patient is a 65 year old gentleman who started having headache earlier this morning as well as left shoulder pain came to the ER was evaluated had a normal head CT and patient was discharged after his headache better on by mouth pain medication and nonsteroidal anti-inflammatories. Headache began again this afternoon and patient had nausea and vomiting associated with it and then became unresponsive patient's wife called EMS and EMS brought the patient to the ER the ER was unresponsive he was intubated sedated and paralyzed head CT revealed a large subarachnoid hemorrhage significantly different from his earlier CT and we have been consulted.  Past Medical History  Diagnosis Date  . Hypertension   . Seizures ([place])   . Diabetes mellitus without complication [facility])     Past Surgical History  Procedure Laterality Date  . Knee surgery    . Cholecystectomy      Family History  Problem Relation Age of Onset  . Alzheimer's disease Mother   . Diabetes Father   . Diabetes Paternal Grandmother   . Diabetes Paternal Grandfather    Social History:  reports that he quit smoking about 27 years ago. He does not have any smokeless tobacco history on file. He reports that he does not drink alcohol or use illicit drugs.  Allergies: No Known Allergies   (Not in a hospital admission)  Results for orders placed or performed during the hospital encounter of 06/10/15 (from the past 48 hour(s))  Ethanol     Status: None   Collection Time: 06/10/15 10:15 PM  Result Value Ref Range   Alcohol, Ethyl (B) <5 <5 mg/dL    Comment:        LOWEST DETECTABLE LIMIT FOR SERUM ALCOHOL IS 5 mg/dL FOR MEDICAL PURPOSES ONLY   Protime-INR     Status: None   Collection Time: 06/10/15 10:15 PM  Result Value Ref Range   Prothrombin Time 15.0 11.6 - 15.2 seconds   INR 1.16 0.00 - 1.49  APTT     Status: None   Collection Time: 06/10/15  10:15 PM  Result Value Ref Range   aPTT 24 24 - 37 seconds  CBC     Status: Abnormal   Collection Time: 06/10/15 10:15 PM  Result Value Ref Range   WBC 10.7 (H) 4.0 - 10.5 K/uL   RBC 4.83 4.22 - 5.81 MIL/uL   Hemoglobin 12.8 (L) 13.0 - 17.0 g/dL   HCT 36.8 (L) 39.0 - 52.0 %   MCV 76.2 (L) 78.0 - 100.0 fL   MCH 26.5 26.0 - 34.0 pg   MCHC 34.8 30.0 - 36.0 g/dL   RDW 14.4 11.5 - 15.5 %   Platelets 302 150 - 400 K/uL  Differential     Status: Abnormal   Collection Time: 06/10/15 10:15 PM  Result Value Ref Range   Neutrophils Relative % 47 %   Neutro Abs 5.0 1.7 - 7.7 K/uL   Lymphocytes Relative 39 %   Lymphs Abs 4.1 (H) 0.7 - 4.0 K/uL   Monocytes Relative 8 %   Monocytes Absolute 0.9 0.1 - 1.0 K/uL   Eosinophils Relative 6 %   Eosinophils Absolute 0.7 0.0 - 0.7 K/uL   Basophils Relative 0 %   Basophils Absolute 0.0 0.0 - 0.1 K/uL  Comprehensive metabolic panel     Status: Abnormal   Collection Time: 06/10/15 10:15 PM  Result Value Ref Range   Sodium 144 135 -  145 mmol/L   Potassium 4.5 3.5 - 5.1 mmol/L    Comment: SLIGHT HEMOLYSIS   Chloride 109 101 - 111 mmol/L   CO2 16 (L) 22 - 32 mmol/L   Glucose, Bld 170 (H) 65 - 99 mg/dL   BUN 16 6 - 20 mg/dL   Creatinine, Ser 1.55 (H) 0.61 - 1.24 mg/dL   Calcium 10.1 8.9 - 10.3 mg/dL   Total Protein 6.4 (L) 6.5 - 8.1 g/dL   Albumin 3.8 3.5 - 5.0 g/dL   AST 49 (H) 15 - 41 U/L   ALT 21 17 - 63 U/L   Alkaline Phosphatase 50 38 - 126 U/L   Total Bilirubin 0.9 0.3 - 1.2 mg/dL   GFR calc non Af Amer 46 (L) >60 mL/min   GFR calc Af Amer 53 (L) >60 mL/min    Comment: (NOTE) The eGFR has been calculated using the CKD EPI equation. This calculation has not been validated in all clinical situations. eGFR's persistently <60 mL/min signify possible Chronic Kidney Disease.    Anion gap 19 (H) 5 - 15  I-stat troponin, ED (not at [facility])     Status: None   Collection Time: 06/10/15 10:19 PM  Result Value Ref Range   Troponin i, poc 0.01  0.00 - 0.08 ng/mL   Comment 3            Comment: Due to the release kinetics of cTnI, a negative result within the first hours of the onset of symptoms does not rule out myocardial infarction with certainty. If myocardial infarction is still suspected, repeat the test at appropriate intervals.   I-Stat Chem 8, ED  (not at [facility], [facility])     Status: Abnormal   Collection Time: 06/10/15 10:21 PM  Result Value Ref Range   Sodium 143 135 - 145 mmol/L   Potassium 3.1 (L) 3.5 - 5.1 mmol/L   Chloride 107 101 - 111 mmol/L   BUN 18 6 - 20 mg/dL   Creatinine, Ser 1.40 (H) 0.61 - 1.24 mg/dL   Glucose, Bld 168 (H) 65 - 99 mg/dL   Calcium, Ion 1.19 1.13 - 1.30 mmol/L   TCO2 22 0 - 100 mmol/L   Hemoglobin 15.0 13.0 - 17.0 g/dL   HCT 44.0 39.0 - 52.0 %  Urine rapid drug screen (hosp performed)not at [facility]     Status: None   Collection Time: 06/10/15 10:33 PM  Result Value Ref Range   Opiates NONE DETECTED NONE DETECTED   Cocaine NONE DETECTED NONE DETECTED   Benzodiazepines NONE DETECTED NONE DETECTED   Amphetamines NONE DETECTED NONE DETECTED   Tetrahydrocannabinol NONE DETECTED NONE DETECTED   Barbiturates NONE DETECTED NONE DETECTED    Comment:        DRUG SCREEN FOR MEDICAL PURPOSES ONLY.  IF CONFIRMATION IS NEEDED FOR ANY PURPOSE, NOTIFY LAB WITHIN 5 DAYS.        LOWEST DETECTABLE LIMITS FOR URINE DRUG SCREEN Drug Class       Cutoff (ng/mL) Amphetamine      1000 Barbiturate      200 Benzodiazepine   035 Tricyclics       009 Opiates          300 Cocaine          300 THC              50   Urinalysis, Routine w reflex microscopic (not at [facility])     Status: Abnormal   Collection Time:  06/10/15 10:33 PM  Result Value Ref Range   Color, Urine YELLOW YELLOW   APPearance CLOUDY (A) CLEAR   Specific Gravity, Urine 1.016 1.005 - 1.030   pH 6.0 5.0 - 8.0   Glucose, UA NEGATIVE NEGATIVE mg/dL   Hgb urine dipstick SMALL (A) NEGATIVE   Bilirubin Urine MODERATE (A) NEGATIVE   Ketones, ur  NEGATIVE NEGATIVE mg/dL   Protein, ur 100 (A) NEGATIVE mg/dL   Nitrite NEGATIVE NEGATIVE   Leukocytes, UA TRACE (A) NEGATIVE  Urine microscopic-add on     Status: Abnormal   Collection Time: 06/10/15 10:33 PM  Result Value Ref Range   Squamous Epithelial / LPF 0-5 (A) NONE SEEN   WBC, UA 0-5 0 - 5 WBC/hpf   RBC / HPF 6-30 0 - 5 RBC/hpf   Bacteria, UA NONE SEEN NONE SEEN   Casts GRANULAR CAST (A) NEGATIVE   Ct Angio Head W/cm &/or Wo Cm  06/10/2015  CLINICAL DATA:  Slurred speech, vomiting, headache, leaning toward the RIGHT. Follow-up intracranial hemorrhage. History of hypertension, seizures and diabetes. EXAM: CT ANGIOGRAPHY HEAD TECHNIQUE: Multidetector CT imaging of the head was performed using the standard protocol during bolus administration of intravenous contrast. Multiplanar CT image reconstructions and MIPs were obtained to evaluate the vascular anatomy. CONTRAST:  170m OMNIPAQUE IOHEXOL 350 MG/ML SOLN COMPARISON:  CT head June 10, 2015 at 2230 hours FINDINGS: Noisy image quality in part due to streak artifact from life-support lines particularly degrading the posterior fossa. Anterior circulation: Normal appearance of the cervical internal carotid arteries, petrous, cavernous and supra clinoid internal carotid arteries. Mild calcific atherosclerosis the carotid siphons. Widely patent anterior communicating artery. Normal appearance of the anterior and middle cerebral arteries. 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 Small bilateral posterior communicating arteries are present. Fetal origin RIGHT posterior cerebral artery. Normal appearance of the posterior cerebral arteries. IMPRESSION: Noisy image quality due to life-support lines, particularly degrading the posterior fossa. Luminal irregularity bilateral vertebral arteries which may be related to streak artifact. However,  dissection or vasospasm is possible. 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 Complete circle of Willis. Electronically Signed   By: CElon AlasM.D.   On: 06/10/2015 23:36   Ct Head Wo Contrast  06/10/2015  CLINICAL DATA:  Code stroke. Slurred speech. Vomiting. Headache onset this morning. EXAM: CT HEAD WITHOUT CONTRAST TECHNIQUE: Contiguous axial images were obtained from the base of the skull through the vertex without intravenous contrast. COMPARISON:  Earlier this day at 0824 hour FINDINGS: Development of rather extensive diffuse subarachnoid hemorrhage from prior exam. Hemorrhage most prominent in the basilar cisterns with layering hemorrhage in both lateral ventricles. Interval enlargement of the lateral ventricles from prior exam consistent with early hydrocephalus. There is obscuration of gray-white differentiation consistent with edema. No midline shift. No calvarial fracture. Mucous retention cyst again seen in the right maxillary sinus. IMPRESSION: Development of rather extensive diffuse subarachnoid hemorrhage from prior exam, aneurysmal pattern. Developing hydrocephalus and cerebral edema. Critical Value/emergent results were called by telephone at the time of interpretation on 06/10/2015 at 10:41 pm to Dr. CAram Beecham, who verbally acknowledged these results. Electronically Signed   By: MJeb LeveringM.D.   On: 06/10/2015 22:43   Ct Head Wo Contrast  06/10/2015  CLINICAL DATA:  Headache with hypertension. EXAM: CT HEAD WITHOUT CONTRAST TECHNIQUE: Contiguous axial  images were obtained from the base of the skull through the vertex without intravenous contrast. COMPARISON:  None. FINDINGS: Ventricles and sulci are appropriate for patient's age. No evidence for acute cortically based infarct, intracranial hemorrhage, mass lesion or mass-effect. Orbits are unremarkable. Polypoid mucosal  thickening right maxillary sinus. Mucosal thickening involving the left maxillary sinus and ethmoid air cells. Sella is mildly expanded with fluid attenuation material, likely secondary to an empty sella. IMPRESSION: No acute intracranial process. Electronically Signed   By: Lovey Newcomer M.D.   On: 06/10/2015 08:42   Dg Chest Portable 1 View  06/10/2015  CLINICAL DATA:  65 year old male status post intubation. EXAM: PORTABLE CHEST 1 VIEW COMPARISON:  Chest radiograph dated 06/08/2015 FINDINGS: An endotracheal tube is noted with tip in the right mainstem bronchus. Recommend retraction by approximately 6 cm. There are bilateral interstitial prominence which may be partially related to poor inspiratory effort. Developing pneumonia is not excluded. Clinical correlation is recommended. No focal consolidation, pleural effusion, or pneumothorax. Stable cardiac silhouette. The osseous structures appear grossly unremarkable. IMPRESSION: Endotracheal tube with tip in the right mainstem bronchus. Recommend retraction and repositioning by approximately 6 cm. Critical Value/emergent results were called by telephone at the time of interpretation on 06/10/2015 at 10:51 pm to Dr. Jearld Pies, who verbally acknowledged these results. Electronically Signed   By: Anner Crete M.D.   On: 06/10/2015 22:54    Review of Systems  Unable to perform ROS   Blood pressure 154/74, pulse 64, temperature 97.5 F (36.4 C), resp. rate 20, SpO2 100 %. Physical Exam  Neurological: He is unresponsive. GCS eye subscore is 1. GCS verbal subscore is 1. GCS motor subscore is 5.  Recently intubated sedated and paralyzed pupils are 3 mm and sluggish he does appear to have a corneal and gag he does appear to have some purposeful to semipurposeful movements right greater than left     Assessment/Plan 65 year old and H/H grade 5 subarachnoid hemorrhage with mild to moderate hydrocephalus. CTA has been performed results are still pending we'll  admit the patient will place a ventriculostomy will schedule arteriography for the morning and discuss with our vascular team.  Jex Strausbaugh P 06/10/2015, 11:53 PM

## 2015-06-10 NOTE — ED Provider Notes (Signed)
CSN: [id number]     Arrival date & time 06/10/15  2201 History   First MD Initiated Contact with Patient 06/10/15 2214     Chief Complaint  Patient presents with  . Respiratory Arrest  . Tachycardia  . Code Stroke     (Consider location/radiation/quality/duration/timing/severity/associated sxs/prior Treatment) HPI Comments: Level of 5 exception of care due to acuity of condition  Patient is a 65 y.o. male presenting with headaches. The history is provided by the EMS personnel and the spouse.  Headache Pain location:  Generalized Quality:  Sharp Radiates to:  Does not radiate Severity currently:  10/10 Onset quality:  Gradual Duration:  1 day Timing:  Constant Progression:  Worsening Chronicity:  New Relieved by:  Nothing Worsened by:  Nothing Ineffective treatments:  None tried Associated symptoms: nausea and vomiting   Associated symptoms: no abdominal pain, no back pain and no diarrhea     Past Medical History  Diagnosis Date  . Hypertension   . Seizures ([facility])   . Diabetes mellitus without complication [facility])    Past Surgical History  Procedure Laterality Date  . Knee surgery    . Cholecystectomy     Family History  Problem Relation Age of Onset  . Alzheimer's disease Mother   . Diabetes Father   . Diabetes Paternal Grandmother   . Diabetes Paternal Grandfather    Social History  Substance Use Topics  . Smoking status: Former Smoker    Quit date: 06/02/1988  . Smokeless tobacco: None  . Alcohol Use: No    Review of Systems  Unable to perform ROS: Intubated  Gastrointestinal: Positive for nausea and vomiting. Negative for abdominal pain and diarrhea.  Musculoskeletal: Negative for back pain.  Neurological: Positive for headaches.      Allergies  Review of patient's allergies indicates no known allergies.  Home Medications   Prior to Admission medications   Medication Sig Start Date End Date Taking? Authorizing Provider  diclofenac sodium  (VOLTAREN) 1 % GEL Apply 1 application topically 2 (two) times daily as needed (pain).    Historical Provider, MD  etodolac (LODINE) 400 MG tablet Take 400 mg by mouth 2 (two) times daily. 05/05/15   Historical Provider, MD  FeFum-FePoly-FA-B Cmp-C-Biot (INTEGRA PLUS) CAPS Take 1 capsule by mouth 2 (two) times daily.  05/16/15   Historical Provider, MD  ibuprofen (ADVIL,MOTRIN) 200 MG tablet Take 800 mg by mouth 2 (two) times daily as needed (pain).    Historical Provider, MD  lamoTRIgine (LAMICTAL) 100 MG tablet Take 100 mg by mouth 2 (two) times daily.     Historical Provider, MD  LORazepam (ATIVAN) 0.5 MG tablet Take 1 mg by mouth See admin instructions. Take 2 tablets (1 mg) by mouth daily at bedtime, may also take 1 tablet (0.5 mg) in the morning as needed for anxiety    Historical Provider, MD  losartan-hydrochlorothiazide (HYZAAR) 100-25 MG per tablet Take 1 tablet by mouth daily.    Historical Provider, MD  metFORMIN (GLUCOPHAGE-XR) 500 MG 24 hr tablet Take 1,500 mg by mouth at bedtime. 05/03/15   Historical Provider, MD  naproxen (NAPROSYN) 500 MG tablet Take 500 mg by mouth 2 (two) times daily as needed for mild pain.  03/21/15   Historical Provider, MD  potassium chloride SA (K-DUR,KLOR-CON) 20 MEQ tablet Take 20 mEq by mouth at bedtime.     Historical Provider, MD  Vitamin D, Ergocalciferol, (DRISDOL) 50000 UNITS CAPS capsule Take 50,000 Units by mouth once a week. On  Sundays 05/05/15   Historical Provider, MD   BP 143/89 mmHg  Pulse 78  Temp(Src) 97.5 F (36.4 C)  Resp 17  SpO2 100% Physical Exam  Constitutional: He appears well-developed and well-nourished. He appears ill.  HENT:  Head: Normocephalic and atraumatic. Head is without abrasion, without contusion and without laceration.  Right Ear: No hemotympanum.  Left Ear: No hemotympanum.  Eyes:    Neck: Normal range of motion.  Cardiovascular: Regular rhythm.  Bradycardia present.  Exam reveals no decreased pulses.   No  murmur heard. Pulmonary/Chest: He is in respiratory distress.  Abdominal: Normal appearance. There is no tenderness. There is no rigidity.  Neurological: He is unresponsive. GCS eye subscore is 4. GCS verbal subscore is 1. GCS motor subscore is 1.  Skin: No rash noted.    ED Course  .Intubation Date/Time: 06/10/2015 11:40 PM Performed by: Stacy GardnerSEYMORE, Lashawn Orrego Authorized by: Stacy GardnerSEYMORE, Deklyn Gibbon Consent: The procedure was performed in an emergent situation. Patient identity confirmed: arm band Time out: Immediately prior to procedure a "time out" was called to verify the correct patient, procedure, equipment, support staff and site/side marked as required. Indications: respiratory distress Intubation method: video-assisted Patient status: paralyzed (RSI) Sedatives: etomidate Paralytic: succinylcholine Laryngoscope size: Mac 3 Tube size: 7.5 mm Tube type: cuffed Number of attempts: 1 Cords visualized: yes Post-procedure assessment: chest rise and CO2 detector Breath sounds: equal Cuff inflated: yes ETT to lip: 27 cm Tube secured with: ETT holder Chest x-ray interpreted by me. Chest x-ray findings: endotracheal tube too low Tube repositioned: tube repositioned successfully Patient tolerance: Patient tolerated the procedure well with no immediate complications   (including critical care time) Labs Review Labs Reviewed  CBC - Abnormal; Notable for the following:    WBC 10.7 (*)    Hemoglobin 12.8 (*)    HCT 36.8 (*)    MCV 76.2 (*)    All other components within normal limits  DIFFERENTIAL - Abnormal; Notable for the following:    Lymphs Abs 4.1 (*)    All other components within normal limits  COMPREHENSIVE METABOLIC PANEL - Abnormal; Notable for the following:    CO2 16 (*)    Glucose, Bld 170 (*)    Creatinine, Ser 1.55 (*)    Total Protein 6.4 (*)    AST 49 (*)    GFR calc non Af Amer 46 (*)    GFR calc Af Amer 53 (*)    Anion gap 19 (*)    All other components within  normal limits  URINALYSIS, ROUTINE W REFLEX MICROSCOPIC (NOT AT [facility]) - Abnormal; Notable for the following:    APPearance CLOUDY (*)    Hgb urine dipstick SMALL (*)    Bilirubin Urine MODERATE (*)    Protein, ur 100 (*)    Leukocytes, UA TRACE (*)    All other components within normal limits  URINE MICROSCOPIC-ADD ON - Abnormal; Notable for the following:    Squamous Epithelial / LPF 0-5 (*)    Casts GRANULAR CAST (*)    All other components within normal limits  I-STAT CHEM 8, ED - Abnormal; Notable for the following:    Potassium 3.1 (*)    Creatinine, Ser 1.40 (*)    Glucose, Bld 168 (*)    All other components within normal limits  URINE CULTURE  ETHANOL  PROTIME-INR  APTT  URINE RAPID DRUG SCREEN, HOSP PERFORMED  I-STAT TROPOININ, ED    Imaging Review Ct Head Wo Contrast  06/10/2015  CLINICAL DATA:  Code stroke. Slurred speech. Vomiting. Headache onset this morning. EXAM: CT HEAD WITHOUT CONTRAST TECHNIQUE: Contiguous axial images were obtained from the base of the skull through the vertex without intravenous contrast. COMPARISON:  Earlier this day at 0824 hour FINDINGS: Development of rather extensive diffuse subarachnoid hemorrhage from prior exam. Hemorrhage most prominent in the basilar cisterns with layering hemorrhage in both lateral ventricles. Interval enlargement of the lateral ventricles from prior exam consistent with early hydrocephalus. There is obscuration of gray-white differentiation consistent with edema. No midline shift. No calvarial fracture. Mucous retention cyst again seen in the right maxillary sinus. IMPRESSION: Development of rather extensive diffuse subarachnoid hemorrhage from prior exam, aneurysmal pattern. Developing hydrocephalus and cerebral edema. Critical Value/emergent results were called by telephone at the time of interpretation on 06/10/2015 at 10:41 pm to Dr. Cyril Mourning , who verbally acknowledged these results. Electronically Signed   By: Rubye Oaks M.D.   On: 06/10/2015 22:43   Ct Head Wo Contrast  06/10/2015  CLINICAL DATA:  Headache with hypertension. EXAM: CT HEAD WITHOUT CONTRAST TECHNIQUE: Contiguous axial images were obtained from the base of the skull through the vertex without intravenous contrast. COMPARISON:  None. FINDINGS: Ventricles and sulci are appropriate for patient's age. No evidence for acute cortically based infarct, intracranial hemorrhage, mass lesion or mass-effect. Orbits are unremarkable. Polypoid mucosal thickening right maxillary sinus. Mucosal thickening involving the left maxillary sinus and ethmoid air cells. Sella is mildly expanded with fluid attenuation material, likely secondary to an empty sella. IMPRESSION: No acute intracranial process. Electronically Signed   By: Annia Belt M.D.   On: 06/10/2015 08:42   Dg Chest Portable 1 View  06/10/2015  CLINICAL DATA:  65 year old male status post intubation. EXAM: PORTABLE CHEST 1 VIEW COMPARISON:  Chest radiograph dated 06/08/2015 FINDINGS: An endotracheal tube is noted with tip in the right mainstem bronchus. Recommend retraction by approximately 6 cm. There are bilateral interstitial prominence which may be partially related to poor inspiratory effort. Developing pneumonia is not excluded. Clinical correlation is recommended. No focal consolidation, pleural effusion, or pneumothorax. Stable cardiac silhouette. The osseous structures appear grossly unremarkable. IMPRESSION: Endotracheal tube with tip in the right mainstem bronchus. Recommend retraction and repositioning by approximately 6 cm. Critical Value/emergent results were called by telephone at the time of interpretation on 06/10/2015 at 10:51 pm to Dr. Lonia Mad, who verbally acknowledged these results. Electronically Signed   By: Elgie Collard M.D.   On: 06/10/2015 22:54   I have personally reviewed and evaluated these images and lab results as part of my medical decision-making.   EKG Interpretation None       MDM   Final diagnoses:  SAH (subarachnoid hemorrhage) (HCC)  Non-intractable vomiting with nausea, vomiting of unspecified type    65 year old black male presents in setting of headache, nausea, vomiting, slurred speech, altered mental status. Per EMS report there were called out as patient was having severe headache and nausea and vomiting. On arrival patient was talking to EMS but appeared distress. During this time patient began have slurred speech and became minimally responsive. Patient with agonal breathing and had numerous bouts of nonbloody nonbilious emesis. In setting this finding patient transported to emergency department.  On arrival patient was bradycardic and obtunded. Patient with agonal breathing and was intubated for airway protection. Please see note above. Patient not following commands and pupils were minimally reactive at this time. In setting of this finding code stroke was initiated and neurology contacted. I discussed case with Dr.  Camillo. CT head performed which showed extensive subarachnoid bleed. This is likely cause of patient's symptoms. Neurosurgery was consult. I discussed case with Dr. Wynetta Emery. Due to extensive subarachnoid bleed with altered mental status patient admitted to ICU for further management. Patient stable at time of admission.  Attending has seen and evaluated patient and Dr. Ranae Palms is in agreement with plan.    Stacy Gardner, MD 06/11/15 0001  Loren Racer, MD 06/12/15 [phone]

## 2015-06-10 NOTE — ED Notes (Addendum)
Woke up at 0530 with headache and left shoulder pain. The shoulder is no longer hurting, but the headache remains. Described as a pounding in the right parietal area. Denies vision changes, no weakness in extremities. Has taken a blood pressure pill and an ativan this morning; no pain relievers. Has a history of seizures, and often a headache in this area precedes seizure activity. Has not had any seizure in 4-5 years.

## 2015-06-10 NOTE — Consult Note (Signed)
PULMONARY / CRITICAL CARE MEDICINE   Name: Darryl [name] MRN: [id number] DOB: 09/17/1950    ADMISSION DATE:  06/10/2015 CONSULTATION DATE:  06/10/15  REFERRING MD:  Jola Babinski  CHIEF COMPLAINT:  Headache, altered mental status  HISTORY OF PRESENT ILLNESS:   Darryl [name] is a 65M with hx of seizures, HTN, diabetes, who presented twice in the last several days with complaints of severe headache. CT earlier today was unremarkable. EMS was called after he returned home for complaints of severe headache, nausea, vomiting. When they arrived he was in some distress, continued to vomit, was noted to have slurred speech and worsening mental status. He also had a brief run of VT that spontaneously resolved. On arrival to the ED he was obtunded, brady, and had agonal respirations and emergently intubated. Repeat CT head was done and showed extensive subarachnoid hemorrhage with developing hydrocephalus and cerebral edema. Neurology and Neurosurgery have seen the patient and NSU is planning for ventriculostomy now with angio in the morning.  PAST MEDICAL HISTORY :  He  has a past medical history of Hypertension; Seizures (HCC); and Diabetes mellitus without complication (HCC).  PAST SURGICAL HISTORY: He  has past surgical history that includes Knee surgery and Cholecystectomy.  No Known Allergies  No current facility-administered medications on file prior to encounter.   Current Outpatient Prescriptions on File Prior to Encounter  Medication Sig  . diclofenac sodium (VOLTAREN) 1 % GEL Apply 1 application topically 2 (two) times daily as needed (pain).  Marland Kitchen etodolac (LODINE) 400 MG tablet Take 400 mg by mouth 2 (two) times daily.  Marland Kitchen FeFum-FePoly-FA-B Cmp-C-Biot (INTEGRA PLUS) CAPS Take 1 capsule by mouth 2 (two) times daily.   Marland Kitchen ibuprofen (ADVIL,MOTRIN) 200 MG tablet Take 800 mg by mouth 2 (two) times daily as needed (pain).  Marland Kitchen lamoTRIgine (LAMICTAL) 100 MG tablet Take 100 mg by mouth 2 (two) times daily.   Marland Kitchen  LORazepam (ATIVAN) 0.5 MG tablet Take 1 mg by mouth See admin instructions. Take 2 tablets (1 mg) by mouth daily at bedtime, may also take 1 tablet (0.5 mg) in the morning as needed for anxiety  . losartan-hydrochlorothiazide (HYZAAR) 100-25 MG per tablet Take 1 tablet by mouth daily.  . metFORMIN (GLUCOPHAGE-XR) 500 MG 24 hr tablet Take 1,500 mg by mouth at bedtime.  . naproxen (NAPROSYN) 500 MG tablet Take 500 mg by mouth 2 (two) times daily as needed for mild pain.   . potassium chloride SA (K-DUR,KLOR-CON) 20 MEQ tablet Take 20 mEq by mouth at bedtime.   . Vitamin D, Ergocalciferol, (DRISDOL) 50000 UNITS CAPS capsule Take 50,000 Units by mouth once a week. On Sundays    FAMILY HISTORY:  His indicated that his mother is deceased. He indicated that his father is deceased. He indicated that his maternal grandmother is deceased. He indicated that his maternal grandfather is deceased. He indicated that his paternal grandmother is deceased. He indicated that his paternal grandfather is deceased.   SOCIAL HISTORY: He  reports that he quit smoking about 27 years ago. He does not have any smokeless tobacco history on file. He reports that he does not drink alcohol or use illicit drugs.  REVIEW OF SYSTEMS:   Unable to obtain  SUBJECTIVE:  n/a  VITAL SIGNS: BP 154/74 mmHg  Pulse 64  Temp(Src) 97.5 F (36.4 C)  Resp 20  SpO2 100%  HEMODYNAMICS:    VENTILATOR SETTINGS: Vent Mode:  [-] PRVC FiO2 (%):  [100 %] 100 % Set Rate:  [  14 bmp] 14 bmp Vt Set:  [550 mL] 550 mL PEEP:  [5 cmH20] 5 cmH20  INTAKE / OUTPUT:    PHYSICAL EXAMINATION: General:  WNWD AAM intubated Neuro:  (on 10 of propofol), R eye w/ upward/outward deviation. Pupils 3mm and minimally reactive. Withdraws to noxious stimuli in all extremities.  HEENT:  No gross abnormalities. ETT/OGT in place Cardiovascular:  RRR, s1, s2, no m/r/g, distal pulses palpable.  Lungs:  Coarse but clear to auscultation bilaterally with no  wheezes, rales or ronchi. Vent assisted. Symmetrical expansion.  Abdomen:  Soft, non-tender, non-distended, +bs, no mass Musculoskeletal:  Moves all extremities. No gross bony deformities. Skin:  Warm, dry, no rashes / sores / ulcers.   LABS:  BMET  Recent Labs Lab 06/08/15 1720 06/10/15 2215 06/10/15 2221  NA 144 144 143  K 4.4 4.5 3.1*  CL 108 109 107  CO2 27 16*  --   BUN 17 16 18   CREATININE 1.32* 1.55* 1.40*  GLUCOSE 90 170* 168*    Electrolytes  Recent Labs Lab 06/08/15 1720 06/10/15 2215  CALCIUM 9.5 10.1    CBC  Recent Labs Lab 06/08/15 1720 06/10/15 2215 06/10/15 2221  WBC 7.8 10.7*  --   HGB 11.6* 12.8* 15.0  HCT 35.0* 36.8* 44.0  PLT 309 302  --     Coag's  Recent Labs Lab 06/10/15 2215  APTT 24  INR 1.16    Sepsis Markers No results for input(s): LATICACIDVEN, PROCALCITON, O2SATVEN in the last 168 hours.  ABG No results for input(s): PHART, PCO2ART, PO2ART in the last 168 hours.  Liver Enzymes  Recent Labs Lab 06/10/15 2215  AST 49*  ALT 21  ALKPHOS 50  BILITOT 0.9  ALBUMIN 3.8    Cardiac Enzymes No results for input(s): TROPONINI, PROBNP in the last 168 hours.  Glucose No results for input(s): GLUCAP in the last 168 hours.  Imaging Ct Angio Head W/cm &/or Wo Cm  06/10/2015  CLINICAL DATA:  Slurred speech, vomiting, headache, leaning toward the RIGHT. Follow-up intracranial hemorrhage. History of hypertension, seizures and diabetes. EXAM: CT ANGIOGRAPHY HEAD TECHNIQUE: Multidetector CT imaging of the head was performed using the standard protocol during bolus administration of intravenous contrast. Multiplanar CT image reconstructions and MIPs were obtained to evaluate the vascular anatomy. CONTRAST:  100mL OMNIPAQUE IOHEXOL 350 MG/ML SOLN COMPARISON:  CT head June 10, 2015 at 2230 hours FINDINGS: Noisy image quality in part due to streak artifact from life-support lines particularly degrading the posterior fossa. Anterior  circulation: Normal appearance of the cervical internal carotid arteries, petrous, cavernous and supra clinoid internal carotid arteries. Mild calcific atherosclerosis the carotid siphons. Widely patent anterior communicating artery. Normal appearance of the anterior and middle cerebral arteries. 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 Small bilateral posterior communicating arteries are present. Fetal origin RIGHT posterior cerebral artery. Normal appearance of the posterior cerebral arteries. IMPRESSION: Noisy image quality due to life-support lines, particularly degrading the posterior fossa. Luminal irregularity bilateral vertebral arteries which may be related to streak artifact. However, dissection or vasospasm is possible. 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 Complete circle of Willis. Electronically Signed   By: Awilda Metroourtnay  Bloomer M.D.   On: 06/10/2015 23:36   Ct Head Wo Contrast  06/10/2015  CLINICAL DATA:  Code stroke. Slurred speech. Vomiting. Headache onset this morning. EXAM:  CT HEAD WITHOUT CONTRAST TECHNIQUE: Contiguous axial images were obtained from the base of the skull through the vertex without intravenous contrast. COMPARISON:  Earlier this day at 0824 hour FINDINGS: Development of rather extensive diffuse subarachnoid hemorrhage from prior exam. Hemorrhage most prominent in the basilar cisterns with layering hemorrhage in both lateral ventricles. Interval enlargement of the lateral ventricles from prior exam consistent with early hydrocephalus. There is obscuration of gray-white differentiation consistent with edema. No midline shift. No calvarial fracture. Mucous retention cyst again seen in the right maxillary sinus. IMPRESSION: Development of rather extensive diffuse  subarachnoid hemorrhage from prior exam, aneurysmal pattern. Developing hydrocephalus and cerebral edema. Critical Value/emergent results were called by telephone at the time of interpretation on 06/10/2015 at 10:41 pm to Dr. Cyril Mourning , who verbally acknowledged these results. Electronically Signed   By: Rubye Oaks M.D.   On: 06/10/2015 22:43   Ct Head Wo Contrast  06/10/2015  CLINICAL DATA:  Headache with hypertension. EXAM: CT HEAD WITHOUT CONTRAST TECHNIQUE: Contiguous axial images were obtained from the base of the skull through the vertex without intravenous contrast. COMPARISON:  None. FINDINGS: Ventricles and sulci are appropriate for patient's age. No evidence for acute cortically based infarct, intracranial hemorrhage, mass lesion or mass-effect. Orbits are unremarkable. Polypoid mucosal thickening right maxillary sinus. Mucosal thickening involving the left maxillary sinus and ethmoid air cells. Sella is mildly expanded with fluid attenuation material, likely secondary to an empty sella. IMPRESSION: No acute intracranial process. Electronically Signed   By: Annia Belt M.D.   On: 06/10/2015 08:42   Dg Chest Portable 1 View  06/10/2015  CLINICAL DATA:  65 year old male status post intubation. EXAM: PORTABLE CHEST 1 VIEW COMPARISON:  Chest radiograph dated 06/08/2015 FINDINGS: An endotracheal tube is noted with tip in the right mainstem bronchus. Recommend retraction by approximately 6 cm. There are bilateral interstitial prominence which may be partially related to poor inspiratory effort. Developing pneumonia is not excluded. Clinical correlation is recommended. No focal consolidation, pleural effusion, or pneumothorax. Stable cardiac silhouette. The osseous structures appear grossly unremarkable. IMPRESSION: Endotracheal tube with tip in the right mainstem bronchus. Recommend retraction and repositioning by approximately 6 cm. Critical Value/emergent results were called by telephone at the time of  interpretation on 06/10/2015 at 10:51 pm to Dr. Lonia Mad, who verbally acknowledged these results. Electronically Signed   By: Elgie Collard M.D.   On: 06/10/2015 22:54     STUDIES:  As above  CULTURES: None  ANTIBIOTICS: Ancef (periprocedural)  SIGNIFICANT EVENTS: See HPI  LINES/TUBES: ETT 06/10/15   DISCUSSION: Mr. Pardo is an unfortunate 65yo AAM who presented with severe headache, vomiting, altered mental status and was found to have extensive subarachnoid blood concerning for aneurysmal rupture. Other significant history includes seizure disorder on Lamictal, HTN and diabetes mellitus type II.   ASSESSMENT / PLAN:  PULMONARY A: Acute respiratory failure 2/2 altered mental status / inability to protect airway  P:   Ventilatory support  Wean as tolerated SBT when able  CARDIOVASCULAR A:  Reported brief run of VT w/ spontaneous resolution  P:  Check lytes, replete as needed  RENAL A:   CKD baseline Cr ~1.3  P:   Trend Cr Monitor lytes  GASTROINTESTINAL A:   Vomiting 2/2 SAH  P:   S/p ventriculostomy Maintain EVD at 10 per NSU Prn zofran OGT to low intermittent  Protonix   HEMATOLOGIC A:   Acute SAH No overt coagulapathy by labs  P:  Trend CBC No dvt  ppx 2/2 SAH and impending procedures  INFECTIOUS A:   Nothing acute P:   No abx  ENDOCRINE A:   Diabetes mellitus type ii on metformin   P:   SSI  NEUROLOGIC A:   Extensive SAH, 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  - Inter-disciplinary family meet or Palliative Care meeting due by:  day 7  CRITICAL CARE Performed by: Orville Govern, MD   Total critical care time: 45  minutes  Critical care time was exclusive of separately billable procedures and treating other patients.  Critical care was necessary to treat or prevent imminent or life-threatening deterioration.  Critical care was time spent personally by me on the following activities: development of treatment plan with patient and/or surrogate as well as nursing, discussions with consultants, evaluation of patient's response to treatment, examination of patient, obtaining history from patient or surrogate, ordering and performing treatments and interventions, ordering and review of laboratory studies, ordering and review of radiographic studies, pulse oximetry and re-evaluation of patient's condition.   Nita Sickle, MD Pulmonary and Critical Care Medicine [facility] Pager: [phone]  06/11/2015, 12:00 AM

## 2015-06-10 NOTE — ED Provider Notes (Signed)
CSN: [id number]     Arrival date & time 06/10/15  0735 History   First MD Initiated Contact with Patient 06/10/15 [phone]     Chief Complaint  Patient presents with  . Headache     (Consider location/radiation/quality/duration/timing/severity/associated sxs/prior Treatment) Patient is a 65 y.o. male presenting with headaches.  Headache  65 year old man who states that he woke up this morning with headache on the right side. He states it is throbbing in nature. He states his blood pressure was high at home with a systolic blood pressure around 160. He took his blood pressure medicine but did not take any other intervention. The headache continues with slightly improved. He states he has some tingling in his left bicep during this event but that this has resolved. He does not have any visual changes, lateralized weakness, difficulty speaking or swallowing, chest pain, dyspnea, abdominal pain, nausea, vomiting, or diarrhea. He was seen here 2 days ago for leg swelling. He states that his medications were changed at that time. Past Medical History  Diagnosis Date  . Hypertension   . Seizures (HCC)   . Diabetes mellitus without complication [facility](HCC)    Past Surgical History  Procedure Laterality Date  . Knee surgery    . Cholecystectomy     Family History  Problem Relation Age of Onset  . Alzheimer's disease Mother   . Diabetes Father   . Diabetes Paternal Grandmother   . Diabetes Paternal Grandfather    Social History  Substance Use Topics  . Smoking status: Former Smoker    Quit date: 06/02/1988  . Smokeless tobacco: None  . Alcohol Use: No    Review of Systems  Neurological: Positive for headaches.  All other systems reviewed and are negative.     Allergies  Review of patient's allergies indicates no known allergies.  Home Medications   Prior to Admission medications   Medication Sig Start Date End Date Taking? Authorizing Provider  diclofenac sodium (VOLTAREN) 1 % GEL  Apply 1 application topically 2 (two) times daily as needed (pain).    Historical Provider, MD  etodolac (LODINE) 400 MG tablet Take 400 mg by mouth 2 (two) times daily. 05/05/15   Historical Provider, MD  FeFum-FePoly-FA-B Cmp-C-Biot (INTEGRA PLUS) CAPS Take 1 capsule by mouth 2 (two) times daily.  05/16/15   Historical Provider, MD  ibuprofen (ADVIL,MOTRIN) 200 MG tablet Take 800 mg by mouth 2 (two) times daily as needed (pain).    Historical Provider, MD  lamoTRIgine (LAMICTAL) 100 MG tablet Take 100 mg by mouth 2 (two) times daily.     Historical Provider, MD  LORazepam (ATIVAN) 0.5 MG tablet Take 1 mg by mouth See admin instructions. Take 2 tablets (1 mg) by mouth daily at bedtime, may also take 1 tablet (0.5 mg) in the morning as needed for anxiety    Historical Provider, MD  losartan-hydrochlorothiazide (HYZAAR) 100-25 MG per tablet Take 1 tablet by mouth daily.    Historical Provider, MD  metFORMIN (GLUCOPHAGE-XR) 500 MG 24 hr tablet Take 1,500 mg by mouth at bedtime. 05/03/15   Historical Provider, MD  naproxen (NAPROSYN) 500 MG tablet Take 500 mg by mouth 2 (two) times daily. 03/21/15   Historical Provider, MD  potassium chloride SA (K-DUR,KLOR-CON) 20 MEQ tablet Take 20 mEq by mouth at bedtime.     Historical Provider, MD  Vitamin D, Ergocalciferol, (DRISDOL) 50000 UNITS CAPS capsule Take 50,000 Units by mouth once a week. On Sundays 05/05/15   Historical Provider, MD  BP 163/93 mmHg  Pulse 66  Temp(Src) 97.5 F (36.4 C) (Oral)  Resp 20  SpO2 99% Physical Exam  Constitutional: He is oriented to person, place, and time. He appears well-developed and well-nourished.  HENT:  Head: Normocephalic and atraumatic.  Right Ear: External ear normal.  Left Ear: External ear normal.  Nose: Nose normal.  Mouth/Throat: Oropharynx is clear and moist.  Eyes: Conjunctivae and EOM are normal. Pupils are equal, round, and reactive to light.  Neck: Normal range of motion. Neck supple.   Cardiovascular: Normal rate, regular rhythm, normal heart sounds and intact distal pulses.   Pulmonary/Chest: Effort normal and breath sounds normal. No respiratory distress. He has no wheezes. He exhibits no tenderness.  Abdominal: Soft. Bowel sounds are normal. He exhibits no distension and no mass. There is no tenderness. There is no guarding.  Musculoskeletal: Normal range of motion.  Neurological: He is alert and oriented to person, place, and time. He has normal reflexes. He exhibits normal muscle tone. Coordination normal.  Skin: Skin is warm and dry.  Psychiatric: He has a normal mood and affect. His behavior is normal. Judgment and thought content normal.  Nursing note and vitals reviewed.   ED Course  Procedures (including critical care time) Imaging Review Dg Chest 2 View  06/08/2015  CLINICAL DATA:  Abnormal EKG. EXAM: CHEST  2 VIEW COMPARISON:  03/08/2012 FINDINGS: Normal mediastinum and cardiac silhouette. Normal pulmonary vasculature. No evidence of effusion, infiltrate, or pneumothorax. No acute bony abnormality. IMPRESSION: No acute cardiopulmonary process. Electronically Signed   By: Genevive Bi M.D.   On: 06/08/2015 17:47   I have personally reviewed and evaluated these images and lab results as part of my medical decision-making.   MDM   Final diagnoses:  Acute nonintractable headache, unspecified headache type    Head CT negative.  Patient without focal deficits.  Ibuprofen given.  Current sbp 150/80 taken by me.  Discussed return precautions.  Patient has follow up with Dr. Parke Simmers on Tuesday.    Margarita Grizzle, MD 06/10/15 [phone]

## 2015-06-10 NOTE — ED Notes (Signed)
Pt intubated by seymore, ed resident 26 at lip.

## 2015-06-10 NOTE — ED Notes (Signed)
Family at bedside. 

## 2015-06-10 NOTE — Consult Note (Addendum)
Referring Physician: ED    Chief Complaint: code stroke, HA, vomiting, altered mental status  HPI:                                                                                                                                         Darryl [name] is an 65 y.o. male with a past medical history significant for HTN, DM, and seizures, brought in by EMS as a code stroke due to acute onset of the above mentioned symptoms. Patient's wife stated that he was upstairs and she was doing something downstairs when she heard him moaning and she rapidly went to assist him and found him on his knees holding his head and complaining of an excruciating HA and vomiting, hence EMS was called. Upon EMS arrival he was alert and awake, conversant, with severe HA , leaning to the right, SBP 160, but on the truck developed agonal respirations and became poorly responsive.  Patient was unresponsive in the ED, with small but symmetric and poorly reactive pupils, thus he was intubated and taken for CT brain that I personally reviewed and showed extensive diffuse subarachnoid hemorrhage, developing hydrocephalus and cerebral edema. 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 Date last known well: 06/10/15 Time last known well: uncertain tPA Given: no, SAH   Past Medical History  Diagnosis Date  . Hypertension   . Seizures (HCC)   . Diabetes mellitus without complication [facility])     Past Surgical History  Procedure Laterality Date  . Knee surgery    . Cholecystectomy      Family History  Problem Relation Age of Onset  . Alzheimer's disease Mother   . Diabetes Father   . Diabetes Paternal Grandmother   . Diabetes Paternal Grandfather    Social History:  reports that he quit smoking about 27 years ago. He does not have  any smokeless tobacco history on file. He reports that he does not drink alcohol or use illicit drugs.  Allergies: No Known Allergies  Medications:  I have reviewed the patient's current medications.  ROS: unable to obtain due to mental status                                                                                                                                       History obtained from wife and chart review   Physical exam:  Constitutional: critically ill, intubated on the vent. BP 130/60, P 72 R 17, Temperature 97.5 F (36.4 C). Eyes: no jaundice or exophthalmos.  Head: normocephalic. Neck: supple, no bruits, no JVD. Cardiac: no murmurs. Lungs: clear. Abdomen: soft, no tender, no mass. Extremities: no edema, clubbing, or cyanosis.  Skin: no rash  Neurologic Examination:                                                                                                      Mental status: unresponsive, intubated on the vent CN 2-12: pupils 2 mm, poorly reactive. No gaze preference. EOM full. Face symmetric. Tongue intubated. Motor: no spontaneous movements. Sensory: no reaction to pain DTR's: unable to elicit. Plantars: mute. Coordination and gait : unable to test due to mental status   Results for orders placed or performed during the hospital encounter of 06/10/15 (from the past 48 hour(s))  Protime-INR     Status: None   Collection Time: 06/10/15 10:15 PM  Result Value Ref Range   Prothrombin Time 15.0 11.6 - 15.2 seconds   INR 1.16 0.00 - 1.49  APTT     Status: None   Collection Time: 06/10/15 10:15 PM  Result Value Ref Range   aPTT 24 24 - 37 seconds  CBC     Status: Abnormal   Collection Time: 06/10/15 10:15 PM  Result Value Ref Range   WBC 10.7 (H) 4.0 - 10.5 K/uL   RBC 4.83 4.22 - 5.81 MIL/uL   Hemoglobin 12.8 (L) 13.0 - 17.0 g/dL    HCT 16.1 (L) 09.6 - 52.0 %   MCV 76.2 (L) 78.0 - 100.0 fL   MCH 26.5 26.0 - 34.0 pg   MCHC 34.8 30.0 - 36.0 g/dL   RDW 04.5 40.9 - 81.1 %   Platelets 302 150 - 400 K/uL  Differential     Status: Abnormal   Collection Time: 06/10/15 10:15 PM  Result Value Ref Range   Neutrophils Relative % 47 %   Neutro Abs 5.0 1.7 - 7.7 K/uL   Lymphocytes Relative 39 %   Lymphs Abs 4.1 (H) 0.7 - 4.0 K/uL  Monocytes Relative 8 %   Monocytes Absolute 0.9 0.1 - 1.0 K/uL   Eosinophils Relative 6 %   Eosinophils Absolute 0.7 0.0 - 0.7 K/uL   Basophils Relative 0 %   Basophils Absolute 0.0 0.0 - 0.1 K/uL  I-stat troponin, ED (not at [facility], [facility])     Status: None   Collection Time: 06/10/15 10:19 PM  Result Value Ref Range   Troponin i, poc 0.01 0.00 - 0.08 ng/mL   Comment 3            Comment: Due to the release kinetics of cTnI, a negative result within the first hours of the onset of symptoms does not rule out myocardial infarction with certainty. If myocardial infarction is still suspected, repeat the test at appropriate intervals.   I-Stat Chem 8, ED  (not at [facility], [facility])     Status: Abnormal   Collection Time: 06/10/15 10:21 PM  Result Value Ref Range   Sodium 143 135 - 145 mmol/L   Potassium 3.1 (L) 3.5 - 5.1 mmol/L   Chloride 107 101 - 111 mmol/L   BUN 18 6 - 20 mg/dL   Creatinine, Ser 1.61 (H) 0.61 - 1.24 mg/dL   Glucose, Bld 096 (H) 65 - 99 mg/dL   Calcium, Ion 0.45 4.09 - 1.30 mmol/L   TCO2 22 0 - 100 mmol/L   Hemoglobin 15.0 13.0 - 17.0 g/dL   HCT 81.1 91.4 - 78.2 %   Ct Head Wo Contrast  06/10/2015  CLINICAL DATA:  Code stroke. Slurred speech. Vomiting. Headache onset this morning. EXAM: CT HEAD WITHOUT CONTRAST TECHNIQUE: Contiguous axial images were obtained from the base of the skull through the vertex without intravenous contrast. COMPARISON:  Earlier this day at 0824 hour FINDINGS: Development of rather extensive diffuse subarachnoid hemorrhage from prior exam. Hemorrhage most  prominent in the basilar cisterns with layering hemorrhage in both lateral ventricles. Interval enlargement of the lateral ventricles from prior exam consistent with early hydrocephalus. There is obscuration of gray-white differentiation consistent with edema. No midline shift. No calvarial fracture. Mucous retention cyst again seen in the right maxillary sinus. IMPRESSION: Development of rather extensive diffuse subarachnoid hemorrhage from prior exam, aneurysmal pattern. Developing hydrocephalus and cerebral edema. Critical Value/emergent results were called by telephone at the time of interpretation on 06/10/2015 at 10:41 pm to Dr. Cyril Mourning , who verbally acknowledged these results. Electronically Signed   By: Rubye Oaks M.D.   On: 06/10/2015 22:43   Ct Head Wo Contrast  06/10/2015  CLINICAL DATA:  Headache with hypertension. EXAM: CT HEAD WITHOUT CONTRAST TECHNIQUE: Contiguous axial images were obtained from the base of the skull through the vertex without intravenous contrast. COMPARISON:  None. FINDINGS: Ventricles and sulci are appropriate for patient's age. No evidence for acute cortically based infarct, intracranial hemorrhage, mass lesion or mass-effect. Orbits are unremarkable. Polypoid mucosal thickening right maxillary sinus. Mucosal thickening involving the left maxillary sinus and ethmoid air cells. Sella is mildly expanded with fluid attenuation material, likely secondary to an empty sella. IMPRESSION: No acute intracranial process. Electronically Signed   By: Annia Belt M.D.   On: 06/10/2015 08:42   Dg Chest Portable 1 View  06/10/2015  CLINICAL DATA:  65 year old male status post intubation. EXAM: PORTABLE CHEST 1 VIEW COMPARISON:  Chest radiograph dated 06/08/2015 FINDINGS: An endotracheal tube is noted with tip in the right mainstem bronchus. Recommend retraction by approximately 6 cm. There are bilateral interstitial prominence which may be partially related to poor inspiratory  effort.  Developing pneumonia is not excluded. Clinical correlation is recommended. No focal consolidation, pleural effusion, or pneumothorax. Stable cardiac silhouette. The osseous structures appear grossly unremarkable. IMPRESSION: Endotracheal tube with tip in the right mainstem bronchus. Recommend retraction and repositioning by approximately 6 cm. Critical Value/emergent results were called by telephone at the time of interpretation on 06/10/2015 at 10:51 pm to Dr. Lonia MadSeymore, who verbally acknowledged these results. Electronically Signed   By: Elgie CollardArash  Radparvar M.D.   On: 06/10/2015 22:54     Assessment:Critically ill  65 y.o. male brought in by EMS with severe HA, vomiting, and CT brain revealing an extensive diffuse aneurysmal subarachnoid hemorrhage, developing hydrocephalus and cerebral edema. 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 Intubated on the vent. Plan for intervention as per neurosurgery. Discussed at length with wife.  Wyatt Portelasvaldo Camilo, MD [facility] [phone]  06/10/2015, 11:00 PM

## 2015-06-10 NOTE — ED Notes (Signed)
Cardiac rhythm with frequent pvcs,

## 2015-06-10 NOTE — ED Notes (Signed)
Pt here for code stroke/resp distress/vtach. Complaining of headaches, intiially was speaking with ems and enroute had runs of vtach and stopped repsonding, asisted ventilaions on arrival. Lung sounds clear with ems on initially assessment and now "junkie" per ems. Pt arrivedwith 18 to left ej

## 2015-06-10 NOTE — Discharge Instructions (Signed)

## 2015-06-10 NOTE — ED Notes (Signed)
Patient transported to CT 

## 2015-06-11 ENCOUNTER — Inpatient Hospital Stay (HOSPITAL_COMMUNITY): Payer: BC Managed Care – PPO

## 2015-06-11 ENCOUNTER — Encounter (HOSPITAL_COMMUNITY)
Admission: EM | Disposition: A | Discharge status: Rehabilitation Facility | Payer: Self-pay | Source: Home / Self Care | Attending: Neurosurgery

## 2015-06-11 ENCOUNTER — Inpatient Hospital Stay (HOSPITAL_COMMUNITY): Payer: BC Managed Care – PPO | Admitting: Anesthesiology

## 2015-06-11 DIAGNOSIS — I609 Nontraumatic subarachnoid hemorrhage, unspecified: Secondary | ICD-10-CM

## 2015-06-11 DIAGNOSIS — R1110 Vomiting, unspecified: Secondary | ICD-10-CM | POA: Insufficient documentation

## 2015-06-11 DIAGNOSIS — R112 Nausea with vomiting, unspecified: Secondary | ICD-10-CM

## 2015-06-11 HISTORY — PX: RADIOLOGY WITH ANESTHESIA: SHX6223

## 2015-06-11 LAB — COMPREHENSIVE METABOLIC PANEL
ALT: 17 U/L (ref 17–63)
AST: 22 U/L (ref 15–41)
Albumin: 3.1 g/dL — ABNORMAL LOW (ref 3.5–5.0)
Alkaline Phosphatase: 41 U/L (ref 38–126)
Anion gap: 13 (ref 5–15)
BUN: 10 mg/dL (ref 6–20)
CO2: 20 mmol/L — ABNORMAL LOW (ref 22–32)
Calcium: 9 mg/dL (ref 8.9–10.3)
Chloride: 106 mmol/L (ref 101–111)
Creatinine, Ser: 1.1 mg/dL (ref 0.61–1.24)
GFR calc Af Amer: >60 mL/min (ref >60)
GFR calc non Af Amer: >60 mL/min (ref >60)
Glucose, Bld: 127 mg/dL — ABNORMAL HIGH (ref 65–99)
Potassium: 3.6 mmol/L (ref 3.5–5.1)
Sodium: 139 mmol/L (ref 135–145)
Total Bilirubin: 0.8 mg/dL (ref 0.3–1.2)
Total Protein: 5.7 g/dL — ABNORMAL LOW (ref 6.5–8.1)

## 2015-06-11 LAB — BLOOD GAS, ARTERIAL
Acid-base deficit: 2.3 mmol/L — ABNORMAL HIGH (ref 0.0–2.0)
Acid-base deficit: 1.6 mmol/L (ref 0.0–2.0)
Bicarbonate: 21.9 mEq/L (ref 20.0–24.0)
Bicarbonate: 21.2 mEq/L (ref 20.0–24.0)
Drawn by: 25203
Drawn by: 331761
FIO2: 0.6
FIO2: 0.4
MECHVT: 550 mL
MECHVT: 550 mL
O2 Saturation: 98.4 %
O2 Saturation: 98.6 %
PEEP: 5 cmH2O
PEEP: 5 cmH2O
Patient temperature: 98.6
Patient temperature: 99.8
RATE: 14 resp/min
RATE: 14 resp/min
TCO2: 23.1 mmol/L (ref 0–100)
TCO2: 22.1 mmol/L (ref 0–100)
pCO2 arterial: 37.4 mmHg (ref 35.0–45.0)
pCO2 arterial: 28.6 mmHg — ABNORMAL LOW (ref 35.0–45.0)
pH, Arterial: 7.386 (ref 7.350–7.450)
pH, Arterial: 7.487 — ABNORMAL HIGH (ref 7.350–7.450)
pO2, Arterial: 135 mmHg — ABNORMAL HIGH (ref 80.0–100.0)
pO2, Arterial: 125 mmHg — ABNORMAL HIGH (ref 80.0–100.0)

## 2015-06-11 LAB — CBC WITH DIFFERENTIAL/PLATELET
Basophils Absolute: 0 10*3/uL (ref 0.0–0.1)
Basophils Relative: 0 %
Eosinophils Absolute: 0 10*3/uL (ref 0.0–0.7)
Eosinophils Relative: 0 %
HCT: 32.4 % — ABNORMAL LOW (ref 39.0–52.0)
Hemoglobin: 10.9 g/dL — ABNORMAL LOW (ref 13.0–17.0)
Lymphocytes Relative: 9 %
Lymphs Abs: 1 10*3/uL (ref 0.7–4.0)
MCH: 25.7 pg — ABNORMAL LOW (ref 26.0–34.0)
MCHC: 33.6 g/dL (ref 30.0–36.0)
MCV: 76.4 fL — ABNORMAL LOW (ref 78.0–100.0)
Monocytes Absolute: 0.8 10*3/uL (ref 0.1–1.0)
Monocytes Relative: 6 %
Neutro Abs: 9.9 10*3/uL — ABNORMAL HIGH (ref 1.7–7.7)
Neutrophils Relative %: 85 %
Platelets: 257 10*3/uL (ref 150–400)
RBC: 4.24 MIL/uL (ref 4.22–5.81)
RDW: 14.5 % (ref 11.5–15.5)
WBC: 11.7 10*3/uL — ABNORMAL HIGH (ref 4.0–10.5)

## 2015-06-11 LAB — GLUCOSE, CAPILLARY
Glucose-Capillary: 121 mg/dL — ABNORMAL HIGH (ref 65–99)
Glucose-Capillary: 126 mg/dL — ABNORMAL HIGH (ref 65–99)
Glucose-Capillary: 92 mg/dL (ref 65–99)
Glucose-Capillary: 124 mg/dL — ABNORMAL HIGH (ref 65–99)
Glucose-Capillary: 121 mg/dL — ABNORMAL HIGH (ref 65–99)

## 2015-06-11 LAB — MRSA PCR SCREENING: MRSA by PCR: NEGATIVE

## 2015-06-11 SURGERY — RADIOLOGY WITH ANESTHESIA
Anesthesia: Monitor Anesthesia Care

## 2015-06-11 MED ORDER — HYDROCHLOROTHIAZIDE 25 MG PO TABS
25.0000 mg | ORAL_TABLET | Freq: Every day | ORAL | Status: DC
  Administered 2015-06-11: 25 mg via ORAL
  Filled 2015-06-11: qty 1

## 2015-06-11 MED ORDER — ANTISEPTIC ORAL RINSE SOLUTION (CORINZ)
7.0000 mL | OROMUCOSAL | Status: DC
  Administered 2015-06-11 – 2015-06-12 (×11): 7 mL via OROMUCOSAL

## 2015-06-11 MED ORDER — LABETALOL HCL 5 MG/ML IV SOLN
10.0000 mg | INTRAVENOUS | Status: DC | PRN
  Administered 2015-06-15 – 2015-06-19 (×4): 20 mg via INTRAVENOUS
  Administered 2015-06-19 – 2015-06-20 (×3): 10 mg via INTRAVENOUS
  Administered 2015-06-21: 20 mg via INTRAVENOUS
  Filled 2015-06-11 (×6): qty 4

## 2015-06-11 MED ORDER — ROCURONIUM BROMIDE 100 MG/10ML IV SOLN
INTRAVENOUS | Status: DC | PRN
Start: 2015-06-11 — End: 2015-06-11
  Administered 2015-06-11: 10 mg via INTRAVENOUS
  Administered 2015-06-11: 30 mg via INTRAVENOUS
  Administered 2015-06-11: 50 mg via INTRAVENOUS
  Administered 2015-06-11: 30 mg via INTRAVENOUS
  Administered 2015-06-11: 20 mg via INTRAVENOUS

## 2015-06-11 MED ORDER — LAMOTRIGINE 100 MG PO TABS
100.0000 mg | ORAL_TABLET | Freq: Two times a day (BID) | ORAL | Status: DC
  Administered 2015-06-11 – 2015-06-27 (×30): 100 mg via ORAL
  Filled 2015-06-11 (×30): qty 1

## 2015-06-11 MED ORDER — VERAPAMIL HCL 2.5 MG/ML IV SOLN
INTRAVENOUS | Status: AC
  Filled 2015-06-11: qty 4

## 2015-06-11 MED ORDER — CEFAZOLIN SODIUM-DEXTROSE 2-3 GM-% IV SOLR
2.0000 g | Freq: Three times a day (TID) | INTRAVENOUS | Status: DC
  Administered 2015-06-11 – 2015-06-21 (×33): 2 g via INTRAVENOUS
  Filled 2015-06-11 (×39): qty 50

## 2015-06-11 MED ORDER — METFORMIN HCL ER 750 MG PO TB24
1500.0000 mg | ORAL_TABLET | Freq: Every day | ORAL | Status: DC
  Filled 2015-06-11: qty 2

## 2015-06-11 MED ORDER — PHENYLEPHRINE HCL 10 MG/ML IJ SOLN
10.0000 mg | INTRAVENOUS | Status: DC | PRN
  Administered 2015-06-11: 25 ug/min via INTRAVENOUS

## 2015-06-11 MED ORDER — LABETALOL HCL 5 MG/ML IV SOLN: 10.0000 mg | INTRAVENOUS | Status: DC | PRN

## 2015-06-11 MED ORDER — POTASSIUM CHLORIDE CRYS ER 20 MEQ PO TBCR
20.0000 meq | EXTENDED_RELEASE_TABLET | Freq: Every day | ORAL | Status: DC
  Administered 2015-06-11 – 2015-06-18 (×8): 20 meq via ORAL
  Filled 2015-06-11 (×8): qty 1

## 2015-06-11 MED ORDER — SENNOSIDES-DOCUSATE SODIUM 8.6-50 MG PO TABS
1.0000 | ORAL_TABLET | Freq: Two times a day (BID) | ORAL | Status: DC
  Administered 2015-06-11 – 2015-06-27 (×23): 1 via ORAL
  Filled 2015-06-11 (×27): qty 1

## 2015-06-11 MED ORDER — SODIUM CHLORIDE 0.9 % IV SOLN
INTRAVENOUS | Status: DC
  Administered 2015-06-11 (×2): via INTRAVENOUS
  Administered 2015-06-11: 1000 mL via INTRAVENOUS
  Administered 2015-06-12 – 2015-06-21 (×12): via INTRAVENOUS
  Administered 2015-06-25 – 2015-06-26 (×2): 1000 mL via INTRAVENOUS
  Administered 2015-06-26 – 2015-06-27 (×2): 100 mL/h via INTRAVENOUS

## 2015-06-11 MED ORDER — MORPHINE SULFATE (PF) 4 MG/ML IV SOLN
INTRAVENOUS | Status: AC
  Administered 2015-06-11: 4 mg
  Filled 2015-06-11: qty 1

## 2015-06-11 MED ORDER — HYDROMORPHONE HCL 1 MG/ML IJ SOLN: 0.5000 mg | INTRAMUSCULAR | Status: DC | PRN

## 2015-06-11 MED ORDER — VITAMIN D (ERGOCALCIFEROL) 1.25 MG (50000 UNIT) PO CAPS
50000.0000 [IU] | ORAL_CAPSULE | ORAL | Status: DC
  Administered 2015-06-17: 50000 [IU] via ORAL
  Filled 2015-06-11 (×2): qty 1

## 2015-06-11 MED ORDER — PROPOFOL 1000 MG/100ML IV EMUL
5.0000 ug/kg/min | INTRAVENOUS | Status: DC
  Administered 2015-06-11: 30 ug/kg/min via INTRAVENOUS
  Administered 2015-06-11: 20 ug/kg/min via INTRAVENOUS
  Administered 2015-06-12: 50 ug/kg/min via INTRAVENOUS
  Filled 2015-06-11 (×4): qty 100

## 2015-06-11 MED ORDER — FENTANYL CITRATE (PF) 100 MCG/2ML IJ SOLN
INTRAMUSCULAR | Status: DC | PRN
  Administered 2015-06-11: 100 ug via INTRAVENOUS

## 2015-06-11 MED ORDER — MIDAZOLAM HCL 5 MG/5ML IJ SOLN
INTRAMUSCULAR | Status: DC | PRN
  Administered 2015-06-11: 2 mg via INTRAVENOUS

## 2015-06-11 MED ORDER — FENTANYL CITRATE (PF) 100 MCG/2ML IJ SOLN: 25.0000 ug | INTRAMUSCULAR | Status: DC | PRN

## 2015-06-11 MED ORDER — HYDRALAZINE HCL 20 MG/ML IJ SOLN
10.0000 mg | INTRAMUSCULAR | Status: DC | PRN
Start: 2015-06-11 — End: 2015-06-27
  Administered 2015-06-12 – 2015-06-20 (×6): 20 mg via INTRAVENOUS
  Filled 2015-06-11 (×2): qty 1
  Filled 2015-06-11: qty 2
  Filled 2015-06-11 (×4): qty 1

## 2015-06-11 MED ORDER — ACETAMINOPHEN 650 MG RE SUPP
650.0000 mg | RECTAL | Status: DC | PRN
  Filled 2015-06-11: qty 1

## 2015-06-11 MED ORDER — PANTOPRAZOLE SODIUM 40 MG IV SOLR
40.0000 mg | Freq: Every day | INTRAVENOUS | Status: DC
  Administered 2015-06-11 – 2015-06-13 (×3): 40 mg via INTRAVENOUS
  Filled 2015-06-11 (×3): qty 40

## 2015-06-11 MED ORDER — VERAPAMIL HCL 2.5 MG/ML IV SOLN
INTRAVENOUS | Status: AC | PRN
  Administered 2015-06-11: 5 mg via INTRA_ARTERIAL

## 2015-06-11 MED ORDER — ACETAMINOPHEN 325 MG PO TABS
650.0000 mg | ORAL_TABLET | ORAL | Status: DC | PRN
  Administered 2015-06-11 – 2015-06-19 (×4): 650 mg via ORAL
  Filled 2015-06-11 (×4): qty 2

## 2015-06-11 MED ORDER — STROKE: EARLY STAGES OF RECOVERY BOOK
Freq: Once | Status: AC
  Administered 2015-06-11: 04:00:00
  Filled 2015-06-11 (×2): qty 1

## 2015-06-11 MED ORDER — CHLORHEXIDINE GLUCONATE 0.12% ORAL RINSE (MEDLINE KIT)
15.0000 mL | Freq: Two times a day (BID) | OROMUCOSAL | Status: DC
  Administered 2015-06-11 – 2015-06-12 (×3): 15 mL via OROMUCOSAL

## 2015-06-11 MED ORDER — FE FUMARATE-B12-VIT C-FA-IFC PO CAPS
1.0000 | ORAL_CAPSULE | Freq: Two times a day (BID) | ORAL | Status: DC
  Administered 2015-06-11 – 2015-06-26 (×29): 1 via ORAL
  Filled 2015-06-11 (×35): qty 1

## 2015-06-11 MED ORDER — IOHEXOL 300 MG/ML  SOLN
250.0000 mL | Freq: Once | INTRAMUSCULAR | Status: AC | PRN
  Administered 2015-06-11: 115 mL via INTRA_ARTERIAL

## 2015-06-11 MED ORDER — INSULIN ASPART 100 UNIT/ML ~~LOC~~ SOLN
2.0000 [IU] | SUBCUTANEOUS | Status: DC
  Administered 2015-06-11 – 2015-06-12 (×5): 2 [IU] via SUBCUTANEOUS
  Administered 2015-06-13: 4 [IU] via SUBCUTANEOUS
  Administered 2015-06-13 (×3): 2 [IU] via SUBCUTANEOUS
  Administered 2015-06-13 – 2015-06-14 (×3): 4 [IU] via SUBCUTANEOUS
  Administered 2015-06-14: 2 [IU] via SUBCUTANEOUS

## 2015-06-11 MED ORDER — SODIUM CHLORIDE 0.9 % IV SOLN
INTRAVENOUS | Status: DC | PRN
  Administered 2015-06-11: 11:00:00 via INTRAVENOUS

## 2015-06-11 MED ORDER — NIMODIPINE 60 MG/20ML PO SOLN
60.0000 mg | ORAL | Status: DC
  Administered 2015-06-11 – 2015-06-15 (×25): 60 mg via ORAL
  Filled 2015-06-11 (×34): qty 20

## 2015-06-11 MED ORDER — LOSARTAN POTASSIUM 50 MG PO TABS
100.0000 mg | ORAL_TABLET | Freq: Every day | ORAL | Status: DC
  Administered 2015-06-11: 100 mg via ORAL
  Filled 2015-06-11: qty 2

## 2015-06-11 MED ORDER — LOSARTAN POTASSIUM-HCTZ 100-25 MG PO TABS: 1.0000 | ORAL_TABLET | Freq: Every day | ORAL | Status: DC

## 2015-06-11 MED ORDER — PROMETHAZINE HCL 25 MG/ML IJ SOLN: 6.2500 mg | INTRAMUSCULAR | Status: DC | PRN

## 2015-06-11 MED ORDER — SODIUM CHLORIDE 0.9 % IV SOLN
500.0000 mg | Freq: Two times a day (BID) | INTRAVENOUS | Status: DC
  Administered 2015-06-11 – 2015-06-14 (×8): 500 mg via INTRAVENOUS
  Filled 2015-06-11 (×9): qty 5

## 2015-06-11 MED ORDER — ETODOLAC 400 MG PO TABS: 400.0000 mg | ORAL_TABLET | Freq: Two times a day (BID) | ORAL | Status: DC

## 2015-06-11 MED ORDER — LACTATED RINGERS IV SOLN: INTRAVENOUS | Status: DC

## 2015-06-11 MED ORDER — MEPERIDINE HCL 25 MG/ML IJ SOLN: 6.2500 mg | INTRAMUSCULAR | Status: DC | PRN

## 2015-06-11 NOTE — Progress Notes (Signed)
PULMONARY / CRITICAL CARE MEDICINE   Name: Darryl [name] MRN: [id number] DOB: June 11, 1950    ADMISSION DATE:  06/10/2015 CONSULTATION DATE:  06/10/15  REFERRING MD:  Conchita Paris  CHIEF COMPLAINT:  Acute onset severe HA  HISTORY OF PRESENT ILLNESS:   [name] is a 65 y.o. male w/ PMHx of HTN, seizures, and DM type II, who presented twice in the last several days with complaints of severe headache. CT earlier on 06/10/15 was unremarkable. EMS was called after he returned home for complaints of severe headache, nausea, vomiting. When they arrived he was in some distress, continued to vomit, was noted to have slurred speech and worsening mental status. He also had a brief run of VT that spontaneously resolved. On arrival to the ED he was obtunded, bradycardic, and had agonal respirations and was emergently intubated. Repeat CT head was done and showed extensive SAH with developing hydrocephalus and cerebral edema. Patient received ventricular drain and right vertebral artery aneurysmal embolization.  SUBJECTIVE:  Sedated, on ventilator. Follows commands. No distress. Family at bedside.   VITAL SIGNS: BP 125/66 mmHg  Pulse 52  Temp(Src) 99.5 F (37.5 C)  Resp 16  SpO2 100%  HEMODYNAMICS:    VENTILATOR SETTINGS: Vent Mode:  [-] PRVC FiO2 (%):  [40 %-100 %] 40 % Set Rate:  [14 bmp] 14 bmp Vt Set:  [550 mL] 550 mL PEEP:  [5 cmH20] 5 cmH20 Pressure Support:  [8 cmH20] 8 cmH20 Plateau Pressure:  [17 cmH20-20 cmH20] 20 cmH20  INTAKE / OUTPUT: I/O last 3 completed shifts: In: 360.5 [I.V.:260.5; IV Piggyback:100] Out: 1279 [Urine:1220; Drains:59]  PHYSICAL EXAMINATION: General:AA male, intubated, sedated.  Neuro:PERRL. Moves all extremities spontaneously.  HEENT: No gross abnormalities. ETT/OGT in place Cardiovascular: Mild tachycardia, irregular, no murmurs, gallops, rubs.  Lungs:Air entry equal bilaterally, coarse breath sounds, but no wheezes, rales, or rhonchi.  Abdomen:  Soft, non-tender, non-distended, +bs, no mass  Musculoskeletal: Moves all extremities. No gross bony deformities. Skin: Warm, dry, no rashes.   LABS:  BMET  Recent Labs Lab 06/08/15 1720 06/10/15 2215 06/10/15 2221  NA 144 144 143  K 4.4 4.5 3.1*  CL 108 109 107  CO2 27 16*  --   BUN 17 16 18   CREATININE 1.32* 1.55* 1.40*  GLUCOSE 90 170* 168*    Electrolytes  Recent Labs Lab 06/08/15 1720 06/10/15 2215  CALCIUM 9.5 10.1    CBC  Recent Labs Lab 06/08/15 1720 06/10/15 2215 06/10/15 2221  WBC 7.8 10.7*  --   HGB 11.6* 12.8* 15.0  HCT 35.0* 36.8* 44.0  PLT 309 302  --     Coag's  Recent Labs Lab 06/10/15 2215  APTT 24  INR 1.16    ABG  Recent Labs Lab 06/11/15 0040  PHART 7.386  PCO2ART 37.4  PO2ART 135.0*    Liver Enzymes  Recent Labs Lab 06/10/15 2215  AST 49*  ALT 21  ALKPHOS 50  BILITOT 0.9  ALBUMIN 3.8    Glucose  Recent Labs Lab 06/11/15 0338 06/11/15 0746 06/11/15 1519  GLUCAP 121* 126* 92    Imaging Ct Angio Head W/cm &/or Wo Cm  06/10/2015  CLINICAL DATA:  Slurred speech, vomiting, headache, leaning toward the RIGHT. Follow-up intracranial hemorrhage. History of hypertension, seizures and diabetes. EXAM: CT ANGIOGRAPHY HEAD TECHNIQUE: Multidetector CT imaging of the head was performed using the standard protocol during bolus administration of intravenous contrast. Multiplanar CT image reconstructions and MIPs were obtained to evaluate the vascular anatomy. CONTRAST:  OMNIPAQUE IOHEXOL 350 MG/ML SOLN COMPARISON:  CT head June 10, 2015 at 2230 hours FINDINGS: Noisy image quality in part due to streak artifact from life-support lines particularly degrading the posterior fossa. Anterior circulation: Normal appearance of the cervical internal carotid arteries, petrous, cavernous and supra clinoid internal carotid arteries. Mild calcific atherosclerosis the carotid siphons. Widely patent anterior communicating artery.  Normal appearance of the anterior and middle cerebral arteries. 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 Small bilateral posterior communicating arteries are present. Fetal origin RIGHT posterior cerebral artery. Normal appearance of the posterior cerebral arteries. IMPRESSION: Noisy image quality due to life-support lines, particularly degrading the posterior fossa. Luminal irregularity bilateral vertebral arteries which may be related to streak artifact. However, dissection or vasospasm is possible. 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 Complete circle of Willis. Electronically Signed   By: Awilda Metro M.D.   On: 06/10/2015 23:36   Ct Head Wo Contrast  06/10/2015  CLINICAL DATA:  Code stroke. Slurred speech. Vomiting. Headache onset this morning. EXAM: CT HEAD WITHOUT CONTRAST TECHNIQUE: Contiguous axial images were obtained from the base of the skull through the vertex without intravenous contrast. COMPARISON:  Earlier this day at 0824 hour FINDINGS: Development of rather extensive diffuse subarachnoid hemorrhage from prior exam. Hemorrhage most prominent in the basilar cisterns with layering hemorrhage in both lateral ventricles. Interval enlargement of the lateral ventricles from prior exam consistent with early hydrocephalus. There is obscuration of gray-white differentiation consistent with edema. No midline shift. No calvarial fracture. Mucous retention cyst again seen in the right maxillary sinus. IMPRESSION: Development of rather extensive diffuse subarachnoid hemorrhage from prior exam, aneurysmal pattern. Developing hydrocephalus and cerebral edema. Critical Value/emergent results were called by telephone at the time of interpretation on 06/10/2015 at 10:41 pm to Dr. Cyril Mourning ,  who verbally acknowledged these results. Electronically Signed   By: Rubye Oaks M.D.   On: 06/10/2015 22:43   Dg Chest Portable 1 View  06/11/2015  CLINICAL DATA:  65 year old male with endotracheal tube placement. EXAM: PORTABLE CHEST 1 VIEW COMPARISON:  Earlier Radiograph dated 06/10/2015. FINDINGS: There has been interval retraction of the endotracheal tube with tip now at the level of the carina. Recommend additional retraction by approximately 4 cm. There has been interval placement of an enteric tube with tip in the right hemi abdomen, likely in the distal stomach. Patchy right perihilar opacity may represent atelectatic changes. Pneumonia is not excluded. No pleural effusion or pneumothorax. The osseous structures are grossly unremarkable. IMPRESSION: Endotracheal tube with tip at the level of the carina. Recommend additional retraction by approximately 4 cm. Electronically Signed   By: Elgie Collard M.D.   On: 06/11/2015 00:18   Dg Chest Portable 1 View  06/10/2015  CLINICAL DATA:  65 year old male status post intubation. EXAM: PORTABLE CHEST 1 VIEW COMPARISON:  Chest radiograph dated 06/08/2015 FINDINGS: An endotracheal tube is noted with tip in the right mainstem bronchus. Recommend retraction by approximately 6 cm. There are bilateral interstitial prominence which may be partially related to poor inspiratory effort. Developing pneumonia is not excluded. Clinical correlation is recommended. No focal consolidation, pleural effusion, or pneumothorax. Stable cardiac silhouette. The osseous structures appear grossly unremarkable. IMPRESSION: Endotracheal tube with tip in the right mainstem bronchus. Recommend retraction and repositioning by approximately 6 cm. Critical Value/emergent results were called by telephone  at the time of interpretation on 06/10/2015 at 10:51 pm to Dr. Lonia Mad, who verbally acknowledged these results. Electronically Signed   By: Elgie Collard M.D.   On: 06/10/2015 22:54     STUDIES:  CT head 06/10/15 >> Development of rather extensive diffuse subarachnoid hemorrhage from prior exam, aneurysmal pattern. Developing hydrocephalus and cerebral edema. CT Angio Head 06/10/15 >> Dissection or vasospasm is possible. 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  CULTURES: None  ANTIBIOTICS: None  SIGNIFICANT EVENTS: 1/8 Admit for [facility], intubated 1/9 Cerebral angio with right vertebral artery coiling  LINES/TUBES: Right ventricular drain 1/9 >> Left Radial A-line 1/9 >>  DISCUSSION: 65 y.o. male w/ PMHx of HTN, seizures, and DM type II, admitted with SAH.   ASSESSMENT / PLAN:  PULMONARY A: Acute respiratory failure 2/2 altered mental status/inability to protect airway P:  Full ventilatory support  Wean as tolerated SBT when able  CARDIOVASCULAR A:  Reported brief run of VT w/ spontaneous resolution P:  Check lytes, replete as needed Continue Telemetry  RENAL A:  CKD baseline Cr ~1.3 P:  Repeat BMP ABG pending  GASTROINTESTINAL A:  Vomiting 2/2 SAH P:  S/p ventriculostomy/coiling OGT in place Protonix   HEMATOLOGIC A:  Acute SAH No overt coagulapathy by labs Mild anemia Mild Leukocytosis, likely reactive P:  Trend CBC SCD's  INFECTIOUS A:  Nothing acute P:  No abx  ENDOCRINE A:  Diabetes mellitus type ii on metformin  P:  SSI  NEUROLOGIC A:  Extensive SAH, 2/2 ruptured right vertebral artery aneurysm H/o seizures P:  RASS goal: -1 S/p ventriculostomy/coiling Control BP w/ goal <160 systolic Prn hydralazine / labetalol for sbp >160 Nimodipine 60 q4h Propofol for sedation  Continue home lamictal Continue Keppra  Fentanyl prn   FAMILY  - Updates: Wife, daughter updated at bedside by me feinstein   Lauris Chroman, MD PGY-3, Internal Medicine Pager: [phone]   06/11/2015,  4:48 PM   STAFF NOTE: Cindi Carbon, MD FACP have personally reviewed patient's available data, including medical history, events of note, physical examination and test results as part of my evaluation. I have discussed with resident/NP and other care providers such as pharmacist, RN and RRT. In addition, I personally evaluated patient and elicited key findings of: int agitation on propofol, lungs clear, drain noted, pcxr last done with low ETT, k was low last night, coiling noted and drain placement after CT with edema and hydro concerns, repeat pcxr now and re assess ett placement, assess abg now on current MV on vent, to goal 40% peep 5, likely can wean in am cpap 5 ps 5, as follows commands well, repeat lytes for K now post op, scd, pos balance, keep a line, TCD thur needed, nimodipine x 21 days, seems the volume on ct has reasonable vasospasm risk, consider MAP goals 75 now, i personally updated oldest daughter and wife at bedside, they are looking forward to possible extubation in am  The patient is critically ill with multiple organ systems failure and requires high complexity decision making for assessment and support, frequent evaluation and titration of therapies, application of advanced monitoring technologies and extensive interpretation of multiple databases.   Critical Care Time devoted to patient care services described in this note is 30 Minutes. This time reflects time of care of this signee: Rory Percy, MD FACP. This critical care time does not reflect procedure time, or teaching time or  supervisory time of PA/NP/Med student/Med Resident etc but could involve care discussion time. Rest per NP/medical resident whose note is outlined above and that I agree with   Darryl Rossettianiel J. Tyson AliasFeinstein, MD, FACP Pgr: [phone] [facility] 06/11/2015 9:37 PM

## 2015-06-11 NOTE — Op Note (Signed)
DIAGNOSTIC CEREBRAL ANGIOGRAM COIL SACRIFICE OF RIGHT VERTEBRAL ARTERY  OPERATOR:  Dr. Lisbeth Renshaw, MD  HISTORY:  The patient is a 65 y.o. male initially presenting with sudden onset of severe HA after have less severe HA earlier in the day. He required intubation and placement of ventricular drain. He improved overnight and was following commands. 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 He now presents for diagnostic angiogram and possible embolization of aneurysm.  APPROACH:  The technical aspects of the procedure as well as its potential risks and benefits were reviewed with the patient's wife. These risks included but were not limited bleeding, infection, allergic reaction, damage to organs/vital structures, stroke, non-diagnostic procedure, and the catastrophic outcomes of heart attack, coma, and death. With an understanding of these risks, informed consent was obtained and witnessed.   The patient was placed in the supine position on the angiography table and the skin of right groin prepped in the usual sterile fashion. The procedure was performed under general anesthesia.  A - French sheath was introduced in the right common femoral artery using Seldinger technique. A fluorophase sequence was used to document the sheath position.   HEPARIN: 0Units total.  CONTRAST AGENT: 140 Omnipaque 300  FLUOROSCOPY TIME: 29.9 Combined AP and lateral minutes   CATHETER(S) AND WIRE(S):  0.035" glidewire  5-French JB-1 glidecatheter  90 cm 5 [place] Straight Envoy guide catheter XT-17 microcatheter Synchro 2 standard microwire  VESSELS CATHETERIZED:  Right internal carotid   Left internal carotid   Right vertebral   Left vertebral   Right common femoral  VESSELS STUDIED:  Aortic arch Right common carotid, neck Right internal carotid, head Right vertebral Right vertebral, 3-D rotation Left internal carotid, head Left vertebral Right  femoral  Right vertebral, pre-embolization Right vertebral, during embolization Right vertebral, immediate post embolization Left vertebral, post-embolization  COILS DEPLOYED (MRI COMPATIBLE): Target 360 soft 4 mm x 10 cm Target 360 ultra 3.5 mm x 8 cm Target 360 ultra 4 mm x 10 cm Target 360 nano 3.5 mm x 6 cm Target 360 soft 4 mm x 8 cm Target 360 soft 3 mm x 8 cm Target 360 ultra 3 mm x 8 cm Codman stretch resistant complex extra soft 4 mm x 8 cm Target 360 ultra 3 mm x 6 cm Codman micrusphere 3 mm x 5.4 cm Target 360 nano 2.5 mm x 4 7 m Target 360 nano 2.5 mm x 4 cm Target helical ultra 2 mm x 6 cm Target helical ultra 2 mm x 4 7 m Target 360 nano 2 mm x 4 cm Target 360 nano 2 mm x 3 cm Target helical ultra 2 mm x 3 cm Target 360 nano 1.5 mm x 3 cm  PROCEDURAL NARRATIVE:  A 5-Fr JB-1 terumo glide catheter was advanced over a 0.035 glidewire into the aortic arch. The above vessels were then sequentially catheterized and cervical/cerebral angiograms taken. After review of images, the catheter was removed without incident.    After arterial line was placed, the glide catheter was introduced, and the right vertebral artery was catheterized. The exchange Glidewire was then advanced into the mid V2 segment of the right vertebral artery. The glide catheter was then exchanged for the straight Envoy guide catheter. Under roadmap guidance, the microcatheter was then advanced into the distal portion of the aneurysm, just proximal to the origin of the right PICA. The above coils were then sequentially deployed, with intermittent angiogram taken to assess for vessel patency. After sacrifice  of the vertebral artery, the final control angiogram was taken. The Envoy guide catheter was then removed, the guide catheter was reintroduced, and the left vertebral artery was catheterized. Cerebral angiogram was taken, and the catheter was removed without incident.  INTERPRETATION:  Right femoral:   Normal vessel. No significant atherosclerotic disease. Arterial sheath in adequate position.   Aortic arch:    Wide, type I, with common origin of the brachiocephalic and left common carotid arteries. No significant ostial stenosis.   Right common carotid: neck:   There is no significant stenosis, occlusion, aneurysm or plaque visualized on this injection.    Right internal carotid: head:   Injection reveals the presence of a widely patent ICA, M1, and A1 segments and their branches. There is no significant stenosis, occlusion, aneurysm or high flow vascular malformation visualized.  There is infundibular origin of the right abdominal artery. Incidental note is made of a fetal-type right posterior cerebral artery. The parenchymal and venous phases are normal. The venous sinuses are widely patent.    Left internal carotid: head:   Injection reveals the presence of a widely patent ICA, A1, and M1 segments and their branches. There is no significant stenosis, occlusion, aneurysm, or high flow vascular malformation visualized. The parenchymal and venous phases are normal. The venous sinuses are widely patent.    Left vertebral:   Injection reveals the presence of a widely patent vertebral artery. This leads to a widely patent basilar artery that terminates in left P1, with a hypoplastic right P1. The basilar apex is normal. There is no significant stenosis, occlusion, aneurysm, or vascular malformation visualized. The parenchymal and venous phases are normal. The venous sinuses are widely patent.   Right vertebral:    The right vertebral artery is nondominant, and relatively small in diameter. 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 There is no stenosis identified. Aneurysm is better delineated on the 3-dimensional rotational angiogram.    Right vertebral, 3D rotation 3D rotational angiogram:   Using a separate and distinct workstation  at a distinct time from any previous procedures, CT angiographic images were reviewed. This included multiplanar and 3D reformatted images. These further delineate the fusiform aneurysm of the proximal right V4 segment. The distal portion of the aneurysm is at the level of the right PICA origin. The aneurysm measures proximally 4.2 mm in maximal dimension.  Right vertebral pre-embolization: There is good flow through the right vertebral artery, and again demonstrated is the fusiform right vertebral artery aneurysm. The right PICA remains patent.  Right vertebral during embolization: Coil mass is seen in the V4 segment, just proximal to the right PICA origin. There is sluggish but continued flow through the coil mass, without complete occlusion of the right vertebral artery. The PICA remains patent.  Right vertebral immediate postembolization: Coil mass is seen within the distal vertebral artery, ending just proximal to the right PICA. There is contrast stasis within the proximal vertebral artery, with complete occlusion, and no contrast seen in the distal vertebral artery.  Left vertebral: The left vertebral artery is widely patent, which leads to a patent basilar, bilateral SCA, and left PCA. There is filling across the vertebrobasilar junction of the distal right vertebral artery, and brisk filling of the right PICA. No aneurysm filling is seen. Capillary phase demonstrates good perfusion of the entire cerebellum. Venous phase is unremarkable.  DISPOSITION:  Upon completion of the study, the femoral sheath was removed and hemostasis obtained using  a 5-Fr ExoSeal closure device. Good proximal and distal lower extremity pulses were documented upon achievement of hemostasis.   The procedure was well tolerated and no early complications were observed.    The patient was taken to the intensive care unit in stable hemodynamic condition for further care.  IMPRESSION:  1. 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 The right PICA remains patent postembolization.

## 2015-06-11 NOTE — Sedation Documentation (Addendum)
Brought pt down to IR #2 with CRNA. Pt intubated and sedated but nods to simple questions. Movement of right arm noted. Ventriculostomy off, dressing dry and intact. NG and foley intact. Dr. Conchita ParisNundkumar spoke with pt wife, consent signed.

## 2015-06-11 NOTE — Anesthesia Procedure Notes (Signed)
Date/Time: 06/11/2015 11:08 AM Performed by: Orvilla FusATO, Avarose Mervine A Pre-anesthesia Checklist: Patient identified, Emergency Drugs available, Suction available, Patient being monitored and Timeout performed Patient Re-evaluated:Patient Re-evaluated prior to inductionOxygen Delivery Method: Circle system utilized Preoxygenation: Pre-oxygenation with 100% oxygen Intubation Type: Inhalational induction with existing ETT Placement Confirmation: positive ETCO2 and breath sounds checked- equal and bilateral Dental Injury: Teeth and Oropharynx as per pre-operative assessment

## 2015-06-11 NOTE — Anesthesia Preprocedure Evaluation (Addendum)
Anesthesia Evaluation  Patient identified by MRN, date of birth, ID band Patient awake    Airway Mallampati: Intubated       Dental   Pulmonary former smoker,    breath sounds clear to auscultation       Cardiovascular hypertension, Pt. on medications  Rhythm:Regular Rate:Normal     Neuro/Psych Seizures -,  negative psych ROS   GI/Hepatic negative GI ROS, Neg liver ROS,   Endo/Other  diabetes, Type 2, Oral Hypoglycemic Agents  Renal/GU negative Renal ROS  negative genitourinary   Musculoskeletal negative musculoskeletal ROS (+)   Abdominal   Peds negative pediatric ROS (+)  Hematology negative hematology ROS (+)   Anesthesia Other Findings   Reproductive/Obstetrics negative OB ROS                          Lab Results  Component Value Date   WBC 10.7* 06/10/2015   HGB 15.0 06/10/2015   HCT 44.0 06/10/2015   MCV 76.2* 06/10/2015   PLT 302 06/10/2015   Lab Results  Component Value Date   CREATININE 1.40* 06/10/2015   BUN 18 06/10/2015   NA 143 06/10/2015   K 3.1* 06/10/2015   CL 107 06/10/2015   CO2 16* 06/10/2015   Lab Results  Component Value Date   INR 1.16 06/10/2015   06/2015: EKG: normal sinus rhythm, frequent PVC's noted.   Anesthesia Physical Anesthesia Plan  ASA: III  Anesthesia Plan: General   Post-op Pain Management:    Induction: Intravenous  Airway Management Planned: Natural Airway  Additional Equipment: Arterial line  Intra-op Plan:   Post-operative Plan: Post-operative intubation/ventilation  Informed Consent: I have reviewed the patients History and Physical, chart, labs and discussed the procedure including the risks, benefits and alternatives for the proposed anesthesia with the patient or authorized representative who has indicated his/her understanding and acceptance.   Dental advisory given  Plan Discussed with: CRNA  Anesthesia Plan  Comments:       Anesthesia Quick Evaluation

## 2015-06-11 NOTE — Sedation Documentation (Signed)
Transported back to 3M04 with anesthesiologist. Report given to Junious Dresseronnie, RN

## 2015-06-11 NOTE — Progress Notes (Signed)
eLink Physician-Brief Progress Note Patient Name: Claria Dicelonzo Christians DOB: 04-Sep-1950 MRN: [id number]   Date of Service  06/11/2015  HPI/Events of Note  Request for maintanence IV fluid order.  eICU Interventions  Will order 0.9 NaCl at 100 mL/hour.      Intervention Category Minor Interventions: Routine modifications to care plan (e.g. PRN medications for pain, fever)  Sommer,Steven Eugene 06/11/2015, 5:27 AM

## 2015-06-11 NOTE — Sedation Documentation (Signed)
5 fr. Exoseal to right groin 

## 2015-06-11 NOTE — Transfer of Care (Signed)
Immediate Anesthesia Transfer of Care Note  Patient: Darryl [name]  Procedure(s) Performed: Procedure(s): RADIOLOGY WITH ANESTHESIA (N/A)  Patient Location: NICU  Anesthesia Type:General  Level of Consciousness: sedated  Airway & Oxygen Therapy: Patient placed on Ventilator (see vital sign flow sheet for setting)  Post-op Assessment: Report given to RN and Post -op Vital signs reviewed and stable  Post vital signs: Reviewed and stable  Last Vitals:  Filed Vitals:   06/11/15 1048 06/11/15 1505  BP: 111/66 143/65  Pulse: 66 47  Temp: 38.2 C   Resp: 34 14    Complications: No apparent anesthesia complications

## 2015-06-11 NOTE — Progress Notes (Signed)
No issues overnight. Pts history reviewed. Has improved with CSF drainage.   EXAM:  BP 96/64 mmHg  Pulse 64  Temp(Src) 100.8 F (38.2 C)  Resp 21  SpO2 100%  Opens eyes Breathes over vent Following commands BUE symmetrically EVD in place  IMPRESSION:  65 y.o. male SAH d# 1, presenting as HH-5, now HH-3.  PLAN: - Will proceed with diagnostic angiogram, possible coiling of any identified aneurysm  I spoke at length with the patient's wife regarding the imaging findings thus far. I explained to them that intracranial aneurysm was the most common non[facility]traumatic [facility] and that the definitive diagnosis is made by diagnostic angiogram. I also explained to them the possible treatment options for intracranial aneurysms including endovascular coiling. The risks of the angiogram, coiling, were also reviewed to include stroke and aneurysm re-rupture leading to weakness/paralysis/coma/death, infection, SZ, hydrocephalus.  The patient's wife understood our discussion and the provided consent to proceed with diagnostic angiogram and the appropriate treatment for any identified aneurysm.

## 2015-06-11 NOTE — Care Management Note (Signed)
Case Management Note  Patient Details  Name: Darryl [name] MRN: [id number] Date of Birth: 11/03/1950  Subjective/Objective:  Pt admitted on 06/10/15 with SAH.  PTA, pt independent, lives with spouse.                    Action/Plan: Pt currently sedated and on ventilator.  Will follow for discharge planning as pt progresses.    Expected Discharge Date:                  Expected Discharge Plan:  IP Rehab Facility  In-House Referral:     Discharge planning Services  CM Consult  Post Acute Care Choice:    Choice offered to:     DME Arranged:    DME Agency:     HH Arranged:    HH Agency:     Status of Service:  In process, will continue to follow  Medicare Important Message Given:    Date Medicare IM Given:    Medicare IM give by:    Date Additional Medicare IM Given:    Additional Medicare Important Message give by:     If discussed at Long Length of Stay Meetings, dates discussed:    Additional Comments:  Quintella BatonJulie W. Aalyah Mansouri, RN, BSN  Trauma/Neuro ICU Case Manager [phone]

## 2015-06-11 NOTE — Procedures (Signed)
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 I extensively went over the risks and benefits of an external ventricular drain placement with his wife and family including Marina Goodellerry procedure course expectations of outcome and alternatives they understand and agreed to proceed forward.  Operative procedure: The right side his his head was prepped and draped his point was selected mid pupil or line approximately a centimeter anterior to the coronal suture this was infiltrated with 5 mL lidocaine with epi and sized bur hole was drilled the dura was pierced with a spinal needle and then ventricular catheter was plastic to about 6 cm immediate CSF came out under pressure on the first pass I then tunneled this through so that the incision and so the catheter in place and dressed and hooked up to an external collecting system. The procedure went by without competition. Patient was placed on prophylactic antibiotics and removed to the nursing staff and the ICU.

## 2015-06-11 NOTE — Anesthesia Postprocedure Evaluation (Signed)
Anesthesia Post Note  Patient: Darryl [name]  Procedure(s) Performed: Procedure(s) (LRB): RADIOLOGY WITH ANESTHESIA (N/A)  Patient location during evaluation: ICU Anesthesia Type: General Level of consciousness: patient remains intubated per anesthesia plan Pain management: pain level controlled Vital Signs Assessment: post-procedure vital signs reviewed and stable Respiratory status: patient remains intubated per anesthesia plan Cardiovascular status: blood pressure returned to baseline and stable Postop Assessment: no signs of nausea or vomiting Anesthetic complications: no    Last Vitals:  Filed Vitals:   06/11/15 1505 06/11/15 1600  BP: 143/65 125/66  Pulse: 47 52  Temp:  37.5 C  Resp: 14 16    Last Pain: There were no vitals filed for this visit.               Fabricio Endsley S

## 2015-06-11 NOTE — Progress Notes (Signed)
Subjective: Patient reports Remains intubated  Objective: Vital signs in last 24 hours: Temp:  [97.2 F (36.2 C)-100 F (37.8 C)] 99.9 F (37.7 C) (01/09 0700) Pulse Rate:  [41-89] 70 (01/09 0700) Resp:  [14-24] 18 (01/09 0700) BP: (93-174)/(59-110) 126/74 mmHg (01/09 0700) SpO2:  [98 %-100 %] 100 % (01/09 0700) FiO2 (%):  [40 %-100 %] 40 % (01/09 0300)  Intake/Output from previous day: 01/08 0701 - 01/09 0700 In: 260.5 [I.V.:260.5] Out: 1279 [Urine:1220; Drains:59] Intake/Output this shift:    Improved pupils equal follows commands bilaterally  Lab Results:  Recent Labs  06/08/15 1720 06/10/15 2215 06/10/15 2221  WBC 7.8 10.7*  --   HGB 11.6* 12.8* 15.0  HCT 35.0* 36.8* 44.0  PLT 309 302  --    BMET  Recent Labs  06/08/15 1720 06/10/15 2215 06/10/15 2221  NA 144 144 143  K 4.4 4.5 3.1*  CL 108 109 107  CO2 27 16*  --   GLUCOSE 90 170* 168*  BUN 17 16 18   CREATININE 1.32* 1.55* 1.40*  CALCIUM 9.5 10.1  --     Studies/Results: Ct Angio Head W/cm &/or Wo Cm  06/10/2015  CLINICAL DATA:  Slurred speech, vomiting, headache, leaning toward the RIGHT. Follow-up intracranial hemorrhage. History of hypertension, seizures and diabetes. EXAM: CT ANGIOGRAPHY HEAD TECHNIQUE: Multidetector CT imaging of the head was performed using the standard protocol during bolus administration of intravenous contrast. Multiplanar CT image reconstructions and MIPs were obtained to evaluate the vascular anatomy. CONTRAST:  OMNIPAQUE IOHEXOL 350 MG/ML SOLN COMPARISON:  CT head June 10, 2015 at 2230 hours FINDINGS: Noisy image quality in part due to streak artifact from life-support lines particularly degrading the posterior fossa. Anterior circulation: Normal appearance of the cervical internal carotid arteries, petrous, cavernous and supra clinoid internal carotid arteries. Mild calcific atherosclerosis the carotid siphons. Widely patent anterior communicating artery. Normal  appearance of the anterior and middle cerebral arteries. 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 Small bilateral posterior communicating arteries are present. Fetal origin RIGHT posterior cerebral artery. Normal appearance of the posterior cerebral arteries. IMPRESSION: Noisy image quality due to life-support lines, particularly degrading the posterior fossa. Luminal irregularity bilateral vertebral arteries which may be related to streak artifact. However, dissection or vasospasm is possible. 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 Complete circle of Willis. Electronically Signed   By: Awilda Metro M.D.   On: 06/10/2015 23:36   Ct Head Wo Contrast  06/10/2015  CLINICAL DATA:  Code stroke. Slurred speech. Vomiting. Headache onset this morning. EXAM: CT HEAD WITHOUT CONTRAST TECHNIQUE: Contiguous axial images were obtained from the base of the skull through the vertex without intravenous contrast. COMPARISON:  Earlier this day at 0824 hour FINDINGS: Development of rather extensive diffuse subarachnoid hemorrhage from prior exam. Hemorrhage most prominent in the basilar cisterns with layering hemorrhage in both lateral ventricles. Interval enlargement of the lateral ventricles from prior exam consistent with early hydrocephalus. There is obscuration of gray-white differentiation consistent with edema. No midline shift. No calvarial fracture. Mucous retention cyst again seen in the right maxillary sinus. IMPRESSION: Development of rather extensive diffuse subarachnoid hemorrhage from prior exam, aneurysmal pattern. Developing hydrocephalus and cerebral edema. Critical Value/emergent results were called by telephone at the time of interpretation on 06/10/2015 at 10:41 pm to Dr. Cyril Mourning , who  verbally  acknowledged these results. Electronically Signed   By: Rubye OaksMelanie  Ehinger M.D.   On: 06/10/2015 22:43   Ct Head Wo Contrast  06/10/2015  CLINICAL DATA:  Headache with hypertension. EXAM: CT HEAD WITHOUT CONTRAST TECHNIQUE: Contiguous axial images were obtained from the base of the skull through the vertex without intravenous contrast. COMPARISON:  None. FINDINGS: Ventricles and sulci are appropriate for patient's age. No evidence for acute cortically based infarct, intracranial hemorrhage, mass lesion or mass-effect. Orbits are unremarkable. Polypoid mucosal thickening right maxillary sinus. Mucosal thickening involving the left maxillary sinus and ethmoid air cells. Sella is mildly expanded with fluid attenuation material, likely secondary to an empty sella. IMPRESSION: No acute intracranial process. Electronically Signed   By: Annia Beltrew  Davis M.D.   On: 06/10/2015 08:42   Dg Chest Portable 1 View  06/11/2015  CLINICAL DATA:  65 year old male with endotracheal tube placement. EXAM: PORTABLE CHEST 1 VIEW COMPARISON:  Earlier Radiograph dated 06/10/2015. FINDINGS: There has been interval retraction of the endotracheal tube with tip now at the level of the carina. Recommend additional retraction by approximately 4 cm. There has been interval placement of an enteric tube with tip in the right hemi abdomen, likely in the distal stomach. Patchy right perihilar opacity may represent atelectatic changes. Pneumonia is not excluded. No pleural effusion or pneumothorax. The osseous structures are grossly unremarkable. IMPRESSION: Endotracheal tube with tip at the level of the carina. Recommend additional retraction by approximately 4 cm. Electronically Signed   By: Elgie CollardArash  Radparvar M.D.   On: 06/11/2015 00:18   Dg Chest Portable 1 View  06/10/2015  CLINICAL DATA:  65 year old male status post intubation. EXAM: PORTABLE CHEST 1 VIEW COMPARISON:  Chest radiograph dated 06/08/2015 FINDINGS: An endotracheal tube is  noted with tip in the right mainstem bronchus. Recommend retraction by approximately 6 cm. There are bilateral interstitial prominence which may be partially related to poor inspiratory effort. Developing pneumonia is not excluded. Clinical correlation is recommended. No focal consolidation, pleural effusion, or pneumothorax. Stable cardiac silhouette. The osseous structures appear grossly unremarkable. IMPRESSION: Endotracheal tube with tip in the right mainstem bronchus. Recommend retraction and repositioning by approximately 6 cm. Critical Value/emergent results were called by telephone at the time of interpretation on 06/10/2015 at 10:51 pm to Dr. Lonia MadSeymore, who verbally acknowledged these results. Electronically Signed   By: Elgie CollardArash  Radparvar M.D.   On: 06/10/2015 22:54    Assessment/Plan: Post-bleed day 1 from grade 5 subarachnoid hemorrhage status post external ventricular drain placement day 0. Clinically improved with drainage probable angiogram today per Dr. Conchita ParisNundkumar  LOS: 1 day     Aqib Lough P 06/11/2015, 7:37 AM

## 2015-06-11 NOTE — Progress Notes (Signed)
Chaplain brought pt's wife (and neighbor who brought her) from ED waiting room to consult B. Per wife pt appeared to have suffered a stroke at home and she called EMS to bring him to hospital. Chaplain offered emotional and spiritual support to pt's wife and we had prayer for pt. Chaplain coordinated with medical staff for Dr. Lonia MadSeymore to speak with pt's wife and later for Dr. Leroy Kennedyamilo to update her regarding pt's CT. I accompanied pt's wife to bedside in Trauma B and then led her to third floor waiting area when pt was moved to 55M. Pt's wife expressed appreciation for chaplain support.

## 2015-06-12 ENCOUNTER — Encounter (HOSPITAL_COMMUNITY): Payer: Self-pay | Admitting: Neurosurgery

## 2015-06-12 DIAGNOSIS — J988 Other specified respiratory disorders: Secondary | ICD-10-CM

## 2015-06-12 LAB — GLUCOSE, CAPILLARY
Glucose-Capillary: 102 mg/dL — ABNORMAL HIGH (ref 65–99)
Glucose-Capillary: 90 mg/dL (ref 65–99)
Glucose-Capillary: 123 mg/dL — ABNORMAL HIGH (ref 65–99)
Glucose-Capillary: 135 mg/dL — ABNORMAL HIGH (ref 65–99)
Glucose-Capillary: 149 mg/dL — ABNORMAL HIGH (ref 65–99)

## 2015-06-12 LAB — URINE CULTURE
Culture: NO GROWTH
Special Requests: NORMAL

## 2015-06-12 LAB — TRIGLYCERIDES: Triglycerides: 80 mg/dL (ref <150)

## 2015-06-12 MED ORDER — FENTANYL CITRATE (PF) 100 MCG/2ML IJ SOLN
25.0000 ug | INTRAMUSCULAR | Status: DC | PRN
  Administered 2015-06-12 (×5): 50 ug via INTRAVENOUS
  Filled 2015-06-12 (×5): qty 2

## 2015-06-12 MED ORDER — DEXAMETHASONE SODIUM PHOSPHATE 4 MG/ML IJ SOLN
4.0000 mg | Freq: Four times a day (QID) | INTRAMUSCULAR | Status: DC
  Administered 2015-06-12 – 2015-06-18 (×23): 4 mg via INTRAVENOUS
  Filled 2015-06-12 (×23): qty 1

## 2015-06-12 MED ORDER — FENTANYL CITRATE (PF) 100 MCG/2ML IJ SOLN
100.0000 ug | INTRAMUSCULAR | Status: DC | PRN
  Administered 2015-06-13 – 2015-06-14 (×11): 100 ug via INTRAVENOUS
  Filled 2015-06-12 (×11): qty 2

## 2015-06-12 MED ORDER — DEXAMETHASONE SODIUM PHOSPHATE 10 MG/ML IJ SOLN
10.0000 mg | Freq: Once | INTRAMUSCULAR | Status: AC
  Administered 2015-06-12: 10 mg via INTRAVENOUS
  Filled 2015-06-12: qty 1

## 2015-06-12 NOTE — Procedures (Signed)
Extubation Procedure Note  Patient Details:   Name: Claria Dicelonzo Varma DOB: August 06, 1950 MRN: [id number]   Airway Documentation:  Airway (Active)  Secured at (cm) 21 cm 06/12/2015  7:26 AM  Measured From Lips 06/12/2015  7:26 AM  Secured Location Left 06/12/2015  7:26 AM  Secured By [employer] Tube Holder 06/12/2015  7:26 AM  Tube Holder Repositioned Yes 06/12/2015  7:26 AM  Cuff Pressure (cm H2O) 28 cm H2O 06/12/2015  3:00 AM  Site Condition Dry 06/12/2015  7:26 AM    Evaluation  O2 sats: stable throughout Complications: No apparent complications Patient did tolerate procedure well. Bilateral Breath Sounds: Clear Suctioning: Oral, Airway Yes   Pt. Was extubated to a 3L [place] without any complications, dyspnea or stridor noted. Pt. Was instructed on IS x 5, highest goal achieved was 250mL (poor pt. Effort)  Daron Offerobb, Tayvia Faughnan L 06/12/2015, 8:52 AM

## 2015-06-12 NOTE — Progress Notes (Signed)
Dr. Craige CottaSood notified of patient complaining of chest pain. Order for stat EKG given. Order carried out. Will continue to monitor closely.

## 2015-06-12 NOTE — Evaluation (Signed)
Clinical/Bedside Swallow Evaluation Patient Details  Name: Darryl [name] MRN: [id number] Date of Birth: 1951/05/06  Today's Date: 06/12/2015 Time: SLP Start Time (ACUTE ONLY): 1240 SLP Stop Time (ACUTE ONLY): 1251 SLP Time Calculation (min) (ACUTE ONLY): 11 min  Past Medical History:  Past Medical History  Diagnosis Date  . Hypertension   . Seizures (HCC)   . Diabetes mellitus without complication [facility](HCC)    Past Surgical History:  Past Surgical History  Procedure Laterality Date  . Knee surgery    . Cholecystectomy     HPI:  [age] [facility] Rt [facility].He was intubated 1/8-1/10. PMHx of HTN, seizures, DM type II.   Assessment / Plan / Recommendation Clinical Impression  Pt is drowsy and has weak phonation and volitional cough. Immediate coughing is observed after trials of thin liquids. While no overt signs of aspiration are observed with purees, he has multiple swallows that are concerning for residue. Recommend to remain NPO except for small amounts of puree for administration of crushed medications. Pt may also have a few ice chips after oral care to facilitate use of swallowing mechanism. Will f/u on next date as able to assess for readiness for additional POs.    Aspiration Risk  Moderate aspiration risk;Severe aspiration risk    Diet Recommendation  NPO   Medication Administration: Crushed with puree    Other  Recommendations Oral Care Recommendations: Oral care QID;Oral care prior to ice chip/H20   Follow up Recommendations   (tba)    Frequency and Duration min 2x/week  2 weeks       Prognosis Prognosis for Safe Diet Advancement: Good      Swallow Study   General Date of Onset: 06/10/15 HPI: 65 [facility].He was intubated 1/8-1/10. PMHx of HTN, seizures, DM type II. Type of Study: Bedside Swallow Evaluation Previous Swallow Assessment: none in chart Diet Prior to  this Study: NPO Temperature Spikes Noted: Yes (100.8) Respiratory Status: Room air History of Recent Intubation: Yes Length of Intubations (days): 2 days Date extubated: 06/12/15 Behavior/Cognition: Cooperative;Distractible;Requires cueing Oral Cavity Assessment: Within Functional Limits Oral Care Completed by SLP: Recent completion by staff Oral Cavity - Dentition: Adequate natural dentition Self-Feeding Abilities: Needs assist Patient Positioning: Upright in bed Baseline Vocal Quality: Low vocal intensity Volitional Cough: Weak Volitional Swallow: Able to elicit    Oral/Motor/Sensory Function Overall Oral Motor/Sensory Function: Generalized oral weakness   Ice Chips Ice chips: Impaired Presentation: Spoon Pharyngeal Phase Impairments: Suspected delayed Swallow;Throat Clearing - Immediate   Thin Liquid Thin Liquid: Impaired Presentation: Cup;Self Fed;Spoon Pharyngeal  Phase Impairments: Suspected delayed Swallow;Cough - Immediate    Nectar Thick Nectar Thick Liquid: Not tested   Honey Thick Honey Thick Liquid: Not tested   Puree Puree: Impaired Presentation: Spoon Pharyngeal Phase Impairments: Suspected delayed Swallow;Multiple swallows   Solid   GO    Solid: Not tested      Maxcine HamLaura Paiewonsky, M.A. CCC-SLP [phone]  Maxcine Hamaiewonsky, Nayely Dingus 06/12/2015,1:47 PM

## 2015-06-12 NOTE — Progress Notes (Signed)
PULMONARY / CRITICAL CARE MEDICINE   Name: Darryl [name] MRN: [id number] DOB: 03-04-1951    ADMISSION DATE:  06/10/2015 CONSULTATION DATE:  06/10/15  REFERRING MD:  Conchita Paris  CHIEF COMPLAINT: Headache  SUBJECTIVE:  Tolerating pressure support.  VITAL SIGNS: BP 153/60 mmHg  Pulse 64  Temp(Src) 99.7 F (37.6 C) (Core (Comment))  Resp 18  SpO2 100%  VENTILATOR SETTINGS: Vent Mode:  [-] PSV;CPAP FiO2 (%):  [40 %] 40 % Set Rate:  [14 bmp] 14 bmp Vt Set:  [550 mL] 550 mL PEEP:  [5 cmH20] 5 cmH20 Pressure Support:  [5 cmH20] 5 cmH20 Plateau Pressure:  [15 cmH20-20 cmH20] 16 cmH20  INTAKE / OUTPUT: I/O last 3 completed shifts: In: 4502.8 [I.V.:4147.8; IV Piggyback:355] Out: 4705 [Urine:4405; Drains:251; Other:49]  PHYSICAL EXAMINATION: General: sedated Neuro: RASS -1 HEENT: ETT in place Cardiac: regular Chest: no wheeze Abd: soft, non tender, +BS Ext: no edema Skin: no rashes  LABS:  BMET  Recent Labs Lab 06/08/15 1720 06/10/15 2215 06/10/15 2221 06/11/15 1730  NA 144 144 143 139  K 4.4 4.5 3.1* 3.6  CL 108 109 107 106  CO2 27 16*  --  20*  BUN 17 16 18 10   CREATININE 1.32* 1.55* 1.40* 1.10  GLUCOSE 90 170* 168* 127*    Electrolytes  Recent Labs Lab 06/08/15 1720 06/10/15 2215 06/11/15 1730  CALCIUM 9.5 10.1 9.0    CBC  Recent Labs Lab 06/08/15 1720 06/10/15 2215 06/10/15 2221 06/11/15 1730  WBC 7.8 10.7*  --  11.7*  HGB 11.6* 12.8* 15.0 10.9*  HCT 35.0* 36.8* 44.0 32.4*  PLT 309 302  --  257    Coag's  Recent Labs Lab 06/10/15 2215  APTT 24  INR 1.16    ABG  Recent Labs Lab 06/11/15 0040 06/11/15 1710  PHART 7.386 7.487*  PCO2ART 37.4 28.6*  PO2ART 135.0* 125*    Liver Enzymes  Recent Labs Lab 06/10/15 2215 06/11/15 1730  AST 49* 22  ALT 21 17  ALKPHOS 50 41  BILITOT 0.9 0.8  ALBUMIN 3.8 3.1*    Glucose  Recent Labs Lab 06/11/15 0746 06/11/15 1519 06/11/15 1957 06/11/15 2300 06/12/15 0340  06/12/15 0801  GLUCAP 126* 92 124* 121* 102* 90    Imaging Dg Chest Port 1 View  06/11/2015  CLINICAL DATA:  Hypoxia EXAM: PORTABLE CHEST 1 VIEW COMPARISON:  None. FINDINGS: Endotracheal tube tip is 1.1 cm above the carina. Nasogastric tube tip and side port are below the diaphragm. No pneumothorax. There is atelectasis in the right base. The lungs elsewhere clear. Heart is mildly enlarged with pulmonary vascularity within normal limits. No adenopathy. IMPRESSION: Tube positions as described without pneumothorax. Note that the endotracheal tube tip is near the carina and may wish to be withdrawn 2-3 cm. Atelectasis right base. Lungs elsewhere clear. Heart mildly enlarged. Electronically Signed   By: Bretta Bang III M.D.   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    He has PMHx of HTN, seizures, DM type II.  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 Hx of seizures. P:  RASS goal: 0 Continue lamictal, keppra Continue nimodipine  PULMONARY A: Compromised airway in setting of SAH. P:  Pressure support wean as tolerated >> might be ready for extubation soon F/u CXR intermittently  CARDIOVASCULAR A:  Hx of HTN. P:  Check lytes, replete as needed Continue Telemetry Goal SBP < 160 per neurosurgery Hold outpt hyzaar  RENAL A: CKD stage 2 - baseline creatinine 1.3. P:  Repeat BMP  GASTROINTESTINAL A:  Nutrition. P:  NPO Protonix for SUP  HEMATOLOGIC A:  Anemia of critical illness. P:  F/u  CBC SCDs for DVT prevention  INFECTIOUS A:  Post-op prophylaxis. P:  Ancef per neurosurgery  ENDOCRINE A:  DM type II. P:  SSI Hold outpt metformin  CC time 34 minutes.  Coralyn HellingVineet Maryellen Dowdle, MD [facility] Pulmonary/Critical Care 06/12/2015, 8:36 AM Pager:  [phone] After 3pm call: [phone]

## 2015-06-12 NOTE — Progress Notes (Signed)
Pt seen and examined. No issues overnight. Pt extubated this am. Does c/o right neck pain, and mild SOB.  EXAM: Temp:  [99.5 F (37.5 C)-100.4 F (38 C)] 99.7 F (37.6 C) (01/10 0700) Pulse Rate:  [47-84] 84 (01/10 0852) Resp:  [12-22] 22 (01/10 0852) BP: (98-164)/(51-86) 130/86 mmHg (01/10 0852) SpO2:  [99 %-100 %] 100 % (01/10 0852) Arterial Line BP: (120-164)/(56-79) 159/62 mmHg (01/10 0700) FiO2 (%):  [40 %] 40 % (01/10 0726) Intake/Output      01/09 0701 - 01/10 0700 01/10 0701 - 01/11 0700   I.V. 3887.3 127.5   IV Piggyback 255    Total Intake 4142.3 127.5   Urine 3185    Drains 192    Other 49    Total Output 3426     Net +716.3 +127.5         Awake, alert Speech hoarse EOMI, tongue midline, face symmetric FC, Moving all extremities. ? Mild right weakness EVD in place, bloody CSF draining  LABS: Lab Results  Component Value Date   CREATININE 1.10 06/11/2015   BUN 10 06/11/2015   NA 139 06/11/2015   K 3.6 06/11/2015   CL 106 06/11/2015   CO2 20* 06/11/2015   Lab Results  Component Value Date   WBC 11.7* 06/11/2015   HGB 10.9* 06/11/2015   HCT 32.4* 06/11/2015   MCV 76.4* 06/11/2015   PLT 257 06/11/2015    IMPRESSION: - 65 y.o. male SAH d# 2 s/p coil sac of RVA, neurologically stable  PLAN: - Will add steroids - Cont monitoring for spasm

## 2015-06-12 NOTE — Progress Notes (Signed)
eLink Physician-Brief Progress Note Patient Name: Darryl [name] Age DOB: 09/06/1950 MRN: [id number]   Date of Service  06/12/2015  HPI/Events of Note  Ventricular ectopy  eICU Interventions  Check lytes     Intervention Category Intermediate Interventions: Other:  Joelyn Lover 06/12/2015, 11:56 PM

## 2015-06-13 ENCOUNTER — Inpatient Hospital Stay (HOSPITAL_COMMUNITY): Payer: BC Managed Care – PPO

## 2015-06-13 DIAGNOSIS — I609 Nontraumatic subarachnoid hemorrhage, unspecified: Secondary | ICD-10-CM

## 2015-06-13 LAB — CBC
HCT: 33.9 % — ABNORMAL LOW (ref 39.0–52.0)
Hemoglobin: 11.4 g/dL — ABNORMAL LOW (ref 13.0–17.0)
MCH: 25.7 pg — ABNORMAL LOW (ref 26.0–34.0)
MCHC: 33.6 g/dL (ref 30.0–36.0)
MCV: 76.4 fL — ABNORMAL LOW (ref 78.0–100.0)
Platelets: 275 10*3/uL (ref 150–400)
RBC: 4.44 MIL/uL (ref 4.22–5.81)
RDW: 14.4 % (ref 11.5–15.5)
WBC: 13.6 10*3/uL — ABNORMAL HIGH (ref 4.0–10.5)

## 2015-06-13 LAB — MAGNESIUM: Magnesium: 1.8 mg/dL (ref 1.7–2.4)

## 2015-06-13 LAB — COMPREHENSIVE METABOLIC PANEL
ALT: 15 U/L — ABNORMAL LOW (ref 17–63)
AST: 22 U/L (ref 15–41)
Albumin: 3.1 g/dL — ABNORMAL LOW (ref 3.5–5.0)
Alkaline Phosphatase: 41 U/L (ref 38–126)
Anion gap: 10 (ref 5–15)
BUN: 10 mg/dL (ref 6–20)
CO2: 21 mmol/L — ABNORMAL LOW (ref 22–32)
Calcium: 8.8 mg/dL — ABNORMAL LOW (ref 8.9–10.3)
Chloride: 111 mmol/L (ref 101–111)
Creatinine, Ser: 0.99 mg/dL (ref 0.61–1.24)
GFR calc Af Amer: >60 mL/min (ref >60)
GFR calc non Af Amer: >60 mL/min (ref >60)
Glucose, Bld: 143 mg/dL — ABNORMAL HIGH (ref 65–99)
Potassium: 3.7 mmol/L (ref 3.5–5.1)
Sodium: 142 mmol/L (ref 135–145)
Total Bilirubin: 0.7 mg/dL (ref 0.3–1.2)
Total Protein: 6.2 g/dL — ABNORMAL LOW (ref 6.5–8.1)

## 2015-06-13 LAB — GLUCOSE, CAPILLARY
Glucose-Capillary: 125 mg/dL — ABNORMAL HIGH (ref 65–99)
Glucose-Capillary: 142 mg/dL — ABNORMAL HIGH (ref 65–99)
Glucose-Capillary: 142 mg/dL — ABNORMAL HIGH (ref 65–99)
Glucose-Capillary: 150 mg/dL — ABNORMAL HIGH (ref 65–99)
Glucose-Capillary: 153 mg/dL — ABNORMAL HIGH (ref 65–99)
Glucose-Capillary: 155 mg/dL — ABNORMAL HIGH (ref 65–99)
Glucose-Capillary: 197 mg/dL — ABNORMAL HIGH (ref 65–99)

## 2015-06-13 LAB — PHOSPHORUS: Phosphorus: 2.6 mg/dL (ref 2.5–4.6)

## 2015-06-13 MED ORDER — ONDANSETRON HCL 4 MG/2ML IJ SOLN
4.0000 mg | Freq: Four times a day (QID) | INTRAMUSCULAR | Status: DC | PRN
  Administered 2015-06-13 – 2015-06-17 (×3): 4 mg via INTRAVENOUS
  Filled 2015-06-13 (×3): qty 2

## 2015-06-13 MED ORDER — ONDANSETRON HCL 4 MG/2ML IJ SOLN: 4.0000 mg | Freq: Four times a day (QID) | INTRAMUSCULAR | Status: DC

## 2015-06-13 NOTE — Progress Notes (Addendum)
Transcranial Doppler  Date POD PCO2 HCT BP  MCA ACA PCA OPHT SIPH VERT Basilar  06/13/15 MS/RS   33.9 137/72 Right  Left   58  66   66  -73   45  50   64  *   61  49   -42  -39   *           Right  Left                                            Right  Left                                             Right  Left                                             Right  Left                                            Right  Left                                            Right  Left                                        MCA = Middle Cerebral Artery      OPHT = Opthalmic Artery     BASILAR = Basilar Artery   ACA = Anterior Cerebral Artery     SIPH = Carotid Siphon PCA = Posterior Cerebral Artery   VERT = Verterbral Artery                   Normal MCA = 62+\-12 ACA = 50+\-12 PCA = 42+\-23   *Unable to insonate  06/13/2015 3:31 PM Gertie FeyMichelle Kameko Hukill, RVT, RDCS, RDMS

## 2015-06-13 NOTE — Progress Notes (Signed)
PULMONARY / CRITICAL CARE MEDICINE   Name: Guhan Bruington MRN: [id number] DOB: 1950/12/16    ADMISSION DATE:  06/10/2015 CONSULTATION DATE:  06/10/15  REFERRING MD:  Conchita Paris  CHIEF COMPLAINT: Headache  SUBJECTIVE:  C/o head/neck pain.  VITAL SIGNS: BP 148/68 mmHg  Pulse 65  Temp(Src) 97.9 F (36.6 C) (Oral)  Resp 17  SpO2 100%  INTAKE / OUTPUT: I/O last 3 completed shifts: In: 4479.6 [I.V.:3864.6; IV Piggyback:615] Out: 4056 [Urine:3335; Emesis/NG output:325; Drains:396]  PHYSICAL EXAMINATION: General: alert Neuro: follows commands HEENT: no sinus tenderness Cardiac: regular Chest: no wheeze Abd: soft, non tender, +BS Ext: no edema Skin: no rashes  LABS:  BMET  Recent Labs Lab 06/10/15 2215 06/10/15 2221 06/11/15 1730 06/13/15 0353  NA 144 143 139 142  K 4.5 3.1* 3.6 3.7  CL 109 107 106 111  CO2 16*  --  20* 21*  BUN 16 18 10 10   CREATININE 1.55* 1.40* 1.10 0.99  GLUCOSE 170* 168* 127* 143*    Electrolytes  Recent Labs Lab 06/10/15 2215 06/11/15 1730 06/13/15 0353  CALCIUM 10.1 9.0 8.8*  MG  --   --  1.8  PHOS  --   --  2.6    CBC  Recent Labs Lab 06/10/15 2215 06/10/15 2221 06/11/15 1730 06/13/15 0353  WBC 10.7*  --  11.7* 13.6*  HGB 12.8* 15.0 10.9* 11.4*  HCT 36.8* 44.0 32.4* 33.9*  PLT 302  --  257 275    Coag's  Recent Labs Lab 06/10/15 2215  APTT 24  INR 1.16    ABG  Recent Labs Lab 06/11/15 0040 06/11/15 1710  PHART 7.386 7.487*  PCO2ART 37.4 28.6*  PO2ART 135.0* 125*    Liver Enzymes  Recent Labs Lab 06/10/15 2215 06/11/15 1730 06/13/15 0353  AST 49* 22 22  ALT 21 17 15*  ALKPHOS 50 41 41  BILITOT 0.9 0.8 0.7  ALBUMIN 3.8 3.1* 3.1*    Glucose  Recent Labs Lab 06/12/15 1207 06/12/15 1638 06/12/15 2051 06/13/15 0023 06/13/15 0327 06/13/15 0747  GLUCAP 123* 135* 149* 150* 142* 125*    Imaging No results found.  STUDIES:  CT head 1/8 >> diffuse SAH, developing hydrocephalus CT Angio  Head 1/8 >> distal Rt V4 aneurysm  CULTURES: 1/8 Urine >> negative  ANTIBIOTICS: 1/8 Ancef >>   SIGNIFICANT EVENTS: 1/8 Admit for [facility], intubated 1/9 Cerebral angio with right vertebral artery coiling  LINES/TUBES: Right ventricular drain 1/9 >> Left Radial A-line 1/9 >> 1/10  ETT 1/9 >> 1/10  DISCUSSION: 65 yo with severe headache from [facility] s/p coiling Rt vertebral artery.    He has PMHx of HTN, seizures, DM type II.  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 Hx of seizures. P:  Continue lamictal, keppra Continue nimodipine Decadron, IVD per neurosurgery  PULMONARY A: Compromised airway in setting of SAH. P:  Monitor oxygenation  CARDIOVASCULAR A:  Hx of HTN. P:  Goal SBP < 160 per neurosurgery Hold outpt hyzaar  RENAL A: CKD stage 2 - baseline creatinine 1.3. P:  F/u BMET  GASTROINTESTINAL A:  Nutrition. P:  Advance diet after speech therapy assessment Protonix for SUP  HEMATOLOGIC A:  Anemia of critical illness. P:  F/u CBC SCDs for DVT prevention  INFECTIOUS A:  Post-op prophylaxis. P:  Ancef per neurosurgery  ENDOCRINE A:  DM type II. P:  SSI Hold outpt metformin  Updated pt's family at bedside.  Coralyn Helling, MD [facility] Pulmonary/Critical  Care 06/13/2015, 9:28 AM Pager:  [phone] After 3pm call: [phone]

## 2015-06-13 NOTE — Progress Notes (Signed)
Speech Language Pathology Treatment: Dysphagia  Patient Details Name: Claria Dicelonzo Ground MRN: [id number] DOB: [birth year] Today's Date: 06/13/2015 Time: [id number] SLP Time Calculation (min) (ACUTE ONLY): 16 min  Assessment / Plan / Recommendation Clinical Impression  F/u after yesterday's clinical swallow evaluation.  Pt with improved strength of phonation and cough today.  Able to tolerate three-ounce water test with no s/s of aspiration, no cough.  Slowed, effortful mastication, but effective.  Pt in significant pain with HOB elevated.  Recommend initiating a dysphagia 2 diet with thin liquids; meds whole in puree.  SLP to follow for safety/diet advancement; D/W RN and daughter.     HPI HPI: 65 [facility].He was intubated 1/8-1/10. PMHx of HTN, seizures, DM type II.      SLP Plan  Continue with current plan of care     Recommendations  Diet recommendations: Dysphagia 2 (fine chop);Thin liquid Liquids provided via: Cup Medication Administration: Whole meds with puree Supervision: Staff to assist with self feeding Compensations: Slow rate;Small sips/bites Postural Changes and/or Swallow Maneuvers:  (upright as high as tolerated by pt)              Oral Care Recommendations: Oral care BID Follow up Recommendations:  (tba) Plan: Continue with current plan of care   [name], Jurell Basista Laurice 06/13/2015, 9:35 AM

## 2015-06-13 NOTE — Progress Notes (Signed)
Dr. Conchita ParisNundkumar notified of patient's uncontrolled pain. Received order to modify fentanyl. Will monitor.

## 2015-06-13 NOTE — Progress Notes (Signed)
Pt seen and examined. No issues overnight. Does report some blurry vision.  EXAM: Temp:  [97.8 F (36.6 C)-98.1 F (36.7 C)] 97.9 F (36.6 C) (01/11 0751) Pulse Rate:  [39-137] 62 (01/11 1000) Resp:  [14-27] 15 (01/11 1000) BP: (130-164)/(61-135) 164/74 mmHg (01/11 1000) SpO2:  [78 %-100 %] 95 % (01/11 1000) Intake/Output      01/10 0701 - 01/11 0700 01/11 0701 - 01/12 0700   I.V. 04542443.1 300   IV Piggyback 360 105   Total Intake 2803.1 405   Urine 2400    Emesis/NG output 325    Drains 256 23   Other     Total Output 2981 23   Net -177.9 +382         Awake, alert EOMI, tongue midline, face symmetric Slight right UE and LE drift EVD in place, bloody CSF  LABS: Lab Results  Component Value Date   CREATININE 0.99 06/13/2015   BUN 10 06/13/2015   NA 142 06/13/2015   K 3.7 06/13/2015   CL 111 06/13/2015   CO2 21* 06/13/2015   Lab Results  Component Value Date   WBC 13.6* 06/13/2015   HGB 11.4* 06/13/2015   HCT 33.9* 06/13/2015   MCV 76.4* 06/13/2015   PLT 275 06/13/2015    IMPRESSION: - 65 y.o. male SAH d# 4 s/p coil sacrifice RVA, improved from admission, has some mild right weakness  PLAN: - Cont observation for spasm - Nimotop/TCD monitoring

## 2015-06-14 LAB — GLUCOSE, CAPILLARY
Glucose-Capillary: 164 mg/dL — ABNORMAL HIGH (ref 65–99)
Glucose-Capillary: 135 mg/dL — ABNORMAL HIGH (ref 65–99)
Glucose-Capillary: 155 mg/dL — ABNORMAL HIGH (ref 65–99)
Glucose-Capillary: 168 mg/dL — ABNORMAL HIGH (ref 65–99)
Glucose-Capillary: 132 mg/dL — ABNORMAL HIGH (ref 65–99)

## 2015-06-14 MED ORDER — HYDROMORPHONE HCL 1 MG/ML IJ SOLN
INTRAMUSCULAR | Status: AC
  Administered 2015-06-14: 1 mg
  Filled 2015-06-14: qty 1

## 2015-06-14 MED ORDER — HYDROMORPHONE HCL 1 MG/ML IJ SOLN
1.0000 mg | INTRAMUSCULAR | Status: DC | PRN
  Administered 2015-06-14 – 2015-06-19 (×16): 1 mg via INTRAVENOUS
  Filled 2015-06-14 (×16): qty 1

## 2015-06-14 MED ORDER — INSULIN ASPART 100 UNIT/ML ~~LOC~~ SOLN
0.0000 [IU] | Freq: Three times a day (TID) | SUBCUTANEOUS | Status: DC
  Administered 2015-06-14 – 2015-06-16 (×4): 3 [IU] via SUBCUTANEOUS
  Administered 2015-06-16: 2 [IU] via SUBCUTANEOUS
  Administered 2015-06-16 – 2015-06-17 (×4): 3 [IU] via SUBCUTANEOUS
  Administered 2015-06-18: 2 [IU] via SUBCUTANEOUS
  Administered 2015-06-18 – 2015-06-19 (×3): 3 [IU] via SUBCUTANEOUS
  Administered 2015-06-19 (×2): 2 [IU] via SUBCUTANEOUS
  Administered 2015-06-20: 5 [IU] via SUBCUTANEOUS
  Administered 2015-06-20 – 2015-06-21 (×5): 3 [IU] via SUBCUTANEOUS
  Administered 2015-06-22: 2 [IU] via SUBCUTANEOUS
  Administered 2015-06-22 – 2015-06-23 (×4): 3 [IU] via SUBCUTANEOUS
  Administered 2015-06-23: 5 [IU] via SUBCUTANEOUS
  Administered 2015-06-24: 3 [IU] via SUBCUTANEOUS
  Administered 2015-06-24: 2 [IU] via SUBCUTANEOUS
  Administered 2015-06-25: 3 [IU] via SUBCUTANEOUS
  Administered 2015-06-25 – 2015-06-27 (×7): 2 [IU] via SUBCUTANEOUS

## 2015-06-14 MED ORDER — INSULIN ASPART 100 UNIT/ML ~~LOC~~ SOLN: 0.0000 [IU] | Freq: Every day | SUBCUTANEOUS | Status: DC

## 2015-06-14 MED ORDER — PANTOPRAZOLE SODIUM 40 MG PO TBEC
40.0000 mg | DELAYED_RELEASE_TABLET | Freq: Every day | ORAL | Status: DC
  Administered 2015-06-14 – 2015-06-27 (×13): 40 mg via ORAL
  Filled 2015-06-14 (×13): qty 1

## 2015-06-14 MED ORDER — HYDROCODONE-ACETAMINOPHEN 10-325 MG PO TABS
1.0000 | ORAL_TABLET | ORAL | Status: DC | PRN
  Administered 2015-06-14 – 2015-06-17 (×12): 2 via ORAL
  Administered 2015-06-17 (×2): 1 via ORAL
  Filled 2015-06-14: qty 2
  Filled 2015-06-14: qty 1
  Filled 2015-06-14 (×7): qty 2
  Filled 2015-06-14: qty 1
  Filled 2015-06-14 (×4): qty 2

## 2015-06-14 NOTE — Progress Notes (Signed)
SLP Cancellation Note  Patient Details Name: Darryl [name] MRN: [id number] DOB: 09/15/1950   Cancelled treatment:       Reason Eval/Treat Not Completed: Pain limiting ability to participate. Patient with c/o headache. RN aware and provided pain meds. Patient declined further po intake/treatment. Daughter present, fed patient am meal, and reported no overt difficulty when questioned. Will f/u 1/13.   Ferdinand LangoLeah Alizah Sills MA, CCC-SLP [phone]    Ferdinand LangoMcCoy Ugo Thoma Meryl 06/14/2015, 9:17 AM

## 2015-06-14 NOTE — Progress Notes (Signed)
PULMONARY / CRITICAL CARE MEDICINE   Name: Darryl [name] MRN: [id number] DOB: 02-25-1951    ADMISSION DATE:  06/10/2015 CONSULTATION DATE:  06/10/15  REFERRING MD:  Conchita Paris  CHIEF COMPLAINT: Headache  SUBJECTIVE:  C/o head/neck pain >> pain meds don't last long enough.  VITAL SIGNS: BP 134/64 mmHg  Pulse 59  Temp(Src) 99.3 F (37.4 C) (Oral)  Resp 18  Ht 5\' 11"  (1.803 m)  Wt 197 lb 8.5 oz (89.6 kg)  BMI 27.56 kg/m2  SpO2 100%  INTAKE / OUTPUT: I/O last 3 completed shifts: In: 4165 [I.V.:3600; IV Piggyback:565] Out: 5244 [Urine:4475; Emesis/NG output:325; Drains:442; Stool:2]  PHYSICAL EXAMINATION: General: alert Neuro: follows commands HEENT: no sinus tenderness Cardiac: regular Chest: no wheeze Abd: soft, non tender, +BS Ext: no edema Skin: no rashes  LABS:  BMET  Recent Labs Lab 06/10/15 2215 06/10/15 2221 06/11/15 1730 06/13/15 0353  NA 144 143 139 142  K 4.5 3.1* 3.6 3.7  CL 109 107 106 111  CO2 16*  --  20* 21*  BUN 16 18 10 10   CREATININE 1.55* 1.40* 1.10 0.99  GLUCOSE 170* 168* 127* 143*    Electrolytes  Recent Labs Lab 06/10/15 2215 06/11/15 1730 06/13/15 0353  CALCIUM 10.1 9.0 8.8*  MG  --   --  1.8  PHOS  --   --  2.6    CBC  Recent Labs Lab 06/10/15 2215 06/10/15 2221 06/11/15 1730 06/13/15 0353  WBC 10.7*  --  11.7* 13.6*  HGB 12.8* 15.0 10.9* 11.4*  HCT 36.8* 44.0 32.4* 33.9*  PLT 302  --  257 275    Coag's  Recent Labs Lab 06/10/15 2215  APTT 24  INR 1.16    ABG  Recent Labs Lab 06/11/15 0040 06/11/15 1710  PHART 7.386 7.487*  PCO2ART 37.4 28.6*  PO2ART 135.0* 125*    Liver Enzymes  Recent Labs Lab 06/10/15 2215 06/11/15 1730 06/13/15 0353  AST 49* 22 22  ALT 21 17 15*  ALKPHOS 50 41 41  BILITOT 0.9 0.8 0.7  ALBUMIN 3.8 3.1* 3.1*    Glucose  Recent Labs Lab 06/13/15 0747 06/13/15 1212 06/13/15 1528 06/13/15 2016 06/13/15 2336 06/14/15 0339  GLUCAP 125* 153* 155* 197* 142* 164*     Imaging No results found.  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    He has PMHx of HTN, seizures, DM type II.  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 Hx of seizures. Headache. P:  Continue lamictal, keppra Continue nimodipine Decadron, IVD per neurosurgery Adjust pain meds  PULMONARY A: Compromised airway in setting of SAH. P:  Monitor oxygenation  CARDIOVASCULAR A:  Hx of HTN. P:  Goal SBP < 160 per neurosurgery Hold outpt hyzaar  RENAL A: CKD stage 2 - baseline creatinine 1.3. P:  F/u BMET  GASTROINTESTINAL A:  Nutrition. P:  Advance diet after speech therapy assessment Protonix for SUP  HEMATOLOGIC A:  Anemia of critical illness. P:  F/u CBC SCDs for DVT prevention  INFECTIOUS A:  Post-op prophylaxis.  P:  Ancef per neurosurgery  ENDOCRINE A:  DM type II. P:  SSI Hold outpt metformin  Updated pt's family at bedside.  Coralyn HellingVineet Bonnye Halle, MD [facility] Pulmonary/Critical Care 06/14/2015, 8:35 AM Pager:  [phone] After 3pm call: [phone]

## 2015-06-14 NOTE — Progress Notes (Signed)
Pt seen and examined. No issues overnight. Does c/o HA and neck pain today  EXAM: Temp:  [97.7 F (36.5 C)-99.4 F (37.4 C)] 99.3 F (37.4 C) (01/12 0400) Pulse Rate:  [51-79] 59 (01/12 0700) Resp:  [13-23] 18 (01/12 0700) BP: (129-176)/(64-107) 134/64 mmHg (01/12 0700) SpO2:  [96 %-100 %] 100 % (01/12 0700) Weight:  [89.6 kg (197 lb 8.5 oz)] 89.6 kg (197 lb 8.5 oz) (01/11 1800) Intake/Output      01/11 0701 - 01/12 0700 01/12 0701 - 01/13 0700   I.V. (mL/kg) 2400 (26.8)    IV Piggyback 360    Total Intake(mL/kg) 2760 (30.8)    Urine (mL/kg/hr) 3225 (1.5)    Emesis/NG output     Drains 301 (0.1)    Stool 2 (0)    Total Output 3528     Net -768           Awake, alert, oriented Speech fluent CN grossly intact 4+/5 RUE/RLE with right drift Good strength on Left  LABS: Lab Results  Component Value Date   CREATININE 0.99 06/13/2015   BUN 10 06/13/2015   NA 142 06/13/2015   K 3.7 06/13/2015   CL 111 06/13/2015   CO2 21* 06/13/2015   Lab Results  Component Value Date   WBC 13.6* 06/13/2015   HGB 11.4* 06/13/2015   HCT 33.9* 06/13/2015   MCV 76.4* 06/13/2015   PLT 275 06/13/2015    IMAGING: TCD velocities reviewed, not concerning for spasm  IMPRESSION: - 65 y.o. male SAH d# 5 s/p RVA sacrifice, remains stable with minimal right hemiparesis  PLAN: - Cont observation  - Will continue CSF drainage, probably for about 7-10 days total before beginning to wean - Nimotop/TCD monitoring - Can mobilize with PT/OT with EVD clamped

## 2015-06-15 ENCOUNTER — Inpatient Hospital Stay (HOSPITAL_COMMUNITY): Payer: BC Managed Care – PPO

## 2015-06-15 DIAGNOSIS — I609 Nontraumatic subarachnoid hemorrhage, unspecified: Secondary | ICD-10-CM

## 2015-06-15 LAB — BASIC METABOLIC PANEL
Anion gap: 14 (ref 5–15)
BUN: 15 mg/dL (ref 6–20)
CO2: 19 mmol/L — ABNORMAL LOW (ref 22–32)
Calcium: 9 mg/dL (ref 8.9–10.3)
Chloride: 105 mmol/L (ref 101–111)
Creatinine, Ser: 1.04 mg/dL (ref 0.61–1.24)
GFR calc Af Amer: >60 mL/min (ref >60)
GFR calc non Af Amer: >60 mL/min (ref >60)
Glucose, Bld: 160 mg/dL — ABNORMAL HIGH (ref 65–99)
Potassium: 4.1 mmol/L (ref 3.5–5.1)
Sodium: 138 mmol/L (ref 135–145)

## 2015-06-15 LAB — GLUCOSE, CAPILLARY
Glucose-Capillary: 151 mg/dL — ABNORMAL HIGH (ref 65–99)
Glucose-Capillary: 127 mg/dL — ABNORMAL HIGH (ref 65–99)
Glucose-Capillary: 181 mg/dL — ABNORMAL HIGH (ref 65–99)
Glucose-Capillary: 124 mg/dL — ABNORMAL HIGH (ref 65–99)

## 2015-06-15 MED ORDER — LEVETIRACETAM 500 MG PO TABS
500.0000 mg | ORAL_TABLET | Freq: Two times a day (BID) | ORAL | Status: DC
  Administered 2015-06-15 – 2015-06-23 (×17): 500 mg via ORAL
  Filled 2015-06-15 (×17): qty 1

## 2015-06-15 MED ORDER — NIMODIPINE 60 MG/20ML PO SOLN
60.0000 mg | ORAL | Status: DC
  Administered 2015-06-15 – 2015-06-23 (×48): 60 mg via ORAL
  Filled 2015-06-15 (×58): qty 20

## 2015-06-15 MED ORDER — NIMODIPINE 30 MG PO CAPS
60.0000 mg | ORAL_CAPSULE | ORAL | Status: DC
  Administered 2015-06-15: 60 mg via ORAL
  Filled 2015-06-15: qty 2

## 2015-06-15 NOTE — Progress Notes (Signed)
Pt seen and examined. No issues overnight. HA improved with dilaudid and hydrocodone. Otherwise no complaints  EXAM: Temp:  [98.3 F (36.8 C)-98.6 F (37 C)] 98.6 F (37 C) (01/13 0800) Pulse Rate:  [47-97] 72 (01/13 0600) Resp:  [14-30] 16 (01/13 0600) BP: (126-176)/(66-96) 154/96 mmHg (01/13 0400) SpO2:  [97 %-100 %] 99 % (01/13 0600) Intake/Output      01/12 0701 - 01/13 0700 01/13 0701 - 01/14 0700   I.V. (mL/kg) 2300 (25.7)    IV Piggyback 310    Total Intake(mL/kg) 2610 (29.1)    Urine (mL/kg/hr) 4715 (2.2)    Drains 322 (0.1)    Stool 0 (0)    Total Output 5037     Net -2427          Stool Occurrence 1 x     Awake, alert, oriented but does seem slightly confused CN grossly intact Mild right UE/LE drift Good strength LUE/LLE EVD in place, bloody csf  LABS: Lab Results  Component Value Date   CREATININE 1.04 06/15/2015   BUN 15 06/15/2015   NA 138 06/15/2015   K 4.1 06/15/2015   CL 105 06/15/2015   CO2 19* 06/15/2015   Lab Results  Component Value Date   WBC 13.6* 06/13/2015   HGB 11.4* 06/13/2015   HCT 33.9* 06/13/2015   MCV 76.4* 06/13/2015   PLT 275 06/13/2015    IMPRESSION: - 65 y.o. male SAH d# 6 s/p RVA sac, neurologically stable  PLAN: - Cont routine spasm observation - Nimotop/TCDs - Mobilize with EVD clamped today with PT/OT

## 2015-06-15 NOTE — Progress Notes (Signed)
Rehab Admissions Coordinator Note:  Patient was screened by Trish MageLogue, Tonni Mansour M for appropriateness for an Inpatient Acute Rehab Consult.  At this time, we are recommending Inpatient Rehab consult.  Trish MageLogue, Vernis Eid M 06/15/2015, 4:05 PM  I can be reached at [phone].

## 2015-06-15 NOTE — Progress Notes (Signed)
Pt was seen exiting bed by himself, RN staff ran to room, pt fell witnessed, hit knees. Assisted back to bed by RNs. MD and family made aware. Vitals stable EVD in place.. Pt reports he is unharmed. Will continue to monitor closely.

## 2015-06-15 NOTE — Progress Notes (Signed)
PULMONARY / CRITICAL CARE MEDICINE   Name: Darryl [name] MRN: [id number] DOB: [birth year]    ADMISSION DATE:  06/10/2015 CONSULTATION DATE:  06/10/15  REFERRING MD:  Conchita ParisNundkumar  CHIEF COMPLAINT: Headache  SUBJECTIVE:  Headache better.  Doesn't like taste of liquid nimotop.  VITAL SIGNS: BP 154/96 mmHg  Pulse 72  Temp(Src) 98.6 F (37 C) (Oral)  Resp 16  Ht 5\' 11"  (1.803 m)  Wt 197 lb 8.5 oz (89.6 kg)  BMI 27.56 kg/m2  SpO2 99%  INTAKE / OUTPUT: I/O last 3 completed shifts: In: 4015 [I.V.:3500; IV Piggyback:515] Out: 4098 [[id number] [Urine:6765; Drains:479; Stool:2]  PHYSICAL EXAMINATION: General: alert Neuro: follows commands HEENT: no sinus tenderness Cardiac: regular Chest: no wheeze Abd: soft, non tender, +BS Ext: no edema Skin: no rashes  LABS:  BMET  Recent Labs Lab 06/11/15 1730 06/13/15 0353 06/15/15 0427  NA 139 142 138  K 3.6 3.7 4.1  CL 106 111 105  CO2 20* 21* 19*  BUN 10 10 15   CREATININE 1.10 0.99 1.04  GLUCOSE 127* 143* 160*    Electrolytes  Recent Labs Lab 06/11/15 1730 06/13/15 0353 06/15/15 0427  CALCIUM 9.0 8.8* 9.0  MG  --  1.8  --   PHOS  --  2.6  --     CBC  Recent Labs Lab 06/10/15 2215 06/10/15 2221 06/11/15 1730 06/13/15 0353  WBC 10.7*  --  11.7* 13.6*  HGB 12.8* 15.0 10.9* 11.4*  HCT 36.8* 44.0 32.4* 33.9*  PLT 302  --  257 275    Coag's  Recent Labs Lab 06/10/15 2215  APTT 24  INR 1.16    ABG  Recent Labs Lab 06/11/15 0040 06/11/15 1710  PHART 7.386 7.487*  PCO2ART 37.4 28.6*  PO2ART 135.0* 125*    Liver Enzymes  Recent Labs Lab 06/10/15 2215 06/11/15 1730 06/13/15 0353  AST 49* 22 22  ALT 21 17 15*  ALKPHOS 50 41 41  BILITOT 0.9 0.8 0.7  ALBUMIN 3.8 3.1* 3.1*    Glucose  Recent Labs Lab 06/14/15 0339 06/14/15 0809 06/14/15 1136 06/14/15 1534 06/14/15 2151 06/15/15 0754  GLUCAP 164* 135* 155* 168* 132* 151*    Imaging No results found.  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    He has PMHx of HTN, seizures, DM type II.  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 Hx of seizures. Headache. P:  Continue lamictal, keppra Continue nimodipine >> change to pill form Decadron, IVD per neurosurgery Adjust pain meds  PULMONARY A: Compromised airway in setting of SAH. P:  Monitor oxygenation  CARDIOVASCULAR A:  Hx of HTN. P:  Goal SBP < 160 per neurosurgery Hold outpt hyzaar  RENAL A: CKD stage 2 - baseline creatinine 1.3. P:  F/u BMET intermittently  GASTROINTESTINAL A:  Nutrition. Dysphagia. P:  D2 diet F/u with speech therapy Protonix for SUP  HEMATOLOGIC A:  Anemia of critical illness. P:  F/u CBC intermittently SCDs for DVT prevention  INFECTIOUS A:  Post-op prophylaxis. P:  Ancef per neurosurgery  ENDOCRINE A:  DM type II. P:  SSI Hold outpt metformin  Updated pt's family at bedside.  PCCM will f/u on 06/18/15, Monday >> call if help needed sooner.  Coralyn Helling, MD [facility] Pulmonary/Critical Care 06/15/2015, 9:18 AM Pager:  [phone] After 3pm call: [phone]

## 2015-06-15 NOTE — Evaluation (Signed)
Occupational Therapy Evaluation Patient Details Name: Darryl [name] MRN: [id number] DOB: May 11, 1951 Today's Date: 06/15/2015    History of Present Illness pt presents with R SAH s/p coiling of R Vertebral Artery.  pt intubated 1/8 - 1/10.  pt with hx of HTN, Seizures, and DM.   Clinical Impression   Pt was independent prior to admission.  Presents with headache, reported blurred vision, impaired cognition, generalized weakness and impaired balance.  Pt is dependent in all ADL and requires +2 assist for mobility. He was not able to ambulate this visit, but performed SPT x 2. Will follow acutely.  Pt will need intensive rehab prior to return home with his supportive wife.      Follow Up Recommendations  CIR;Supervision/Assistance - 24 hour    Equipment Recommendations   (determine in next venue)    Recommendations for Other Services       Precautions / Restrictions Precautions Precautions: Fall Precaution Comments: Ventricular Drain Restrictions Weight Bearing Restrictions: No      Mobility Bed Mobility Overal bed mobility: Needs Assistance;+2 for physical assistance Bed Mobility: Supine to Sit     Supine to sit: Mod assist;+2 for physical assistance;HOB elevated     General bed mobility comments: pt does participate with bed mobility, but stops frequently due to headache pain and leans to R side.    Transfers Overall transfer level: Needs assistance Equipment used: Rolling walker (2 wheeled);2 person hand held assist Transfers: Sit to/from [employer] Sit to Stand: Mod assist;+2 physical assistance Stand pivot transfers: Mod assist;+2 physical assistance       General transfer comment: pt able to perform sit to stand and SPT with and without RW.  cues for UE use and safe technique.  pt initially needing A with movement of LEs through transfer, but after repeated transfers to/from 3-in-1 pt with improved ability to step LEs around pivot.  pt with  definite lean to R side and flexed posture.  .      Balance Overall balance assessment: Needs assistance Sitting-balance support: Bilateral upper extremity supported;Feet supported Sitting balance-Leahy Scale: Fair   Postural control: Right lateral lean Standing balance support: During functional activity Standing balance-Leahy Scale: Poor                              ADL Overall ADL's : Needs assistance/impaired Eating/Feeding: Minimal assistance;Sitting   Grooming: Wash/dry hands;Wash/dry face;Minimal assistance;Sitting   Upper Body Bathing: Maximal assistance;Sitting   Lower Body Bathing: Total assistance;Sit to/from stand   Upper Body Dressing : Maximal assistance;Sitting   Lower Body Dressing: Total assistance;Sit to/from stand   Toilet Transfer: +2 for physical assistance;Stand-pivot;BSC;Moderate assistance   Toileting- Clothing Manipulation and Hygiene: Total assistance;Sit to/from stand;+2 for physical assistance               Vision Additional Comments: needs further assessment   Perception     Praxis      Pertinent Vitals/Pain Pain Assessment: Faces Pain Score: 7  Faces Pain Scale: Hurts whole lot Pain Location: head Pain Descriptors / Indicators: Headache Pain Intervention(s): Limited activity within patient's tolerance;Monitored during session;Repositioned;Patient requesting pain meds-RN notified     Hand Dominance Right   Extremity/Trunk Assessment Upper Extremity Assessment Upper Extremity Assessment: Defer to OT evaluation   Lower Extremity Assessment Lower Extremity Assessment: Generalized weakness LLE Deficits / Details: Moving L LE, but groslly weaker than R LE.  Sensation intact. LLE Coordination: decreased fine motor;decreased gross  motor   Cervical / Trunk Assessment Cervical / Trunk Assessment: Normal   Communication Communication Communication: No difficulties   Cognition Arousal/Alertness: Awake/alert Behavior  During Therapy: Impulsive Overall Cognitive Status: Impaired/Different from baseline Area of Impairment: Orientation;Attention;Memory;Following commands;Safety/judgement;Awareness;Problem solving Orientation Level: Disoriented to;Place;Time;Situation Current Attention Level: Sustained Memory: Decreased short-term memory Following Commands: Follows one step commands with increased time;Follows multi-step commands inconsistently Safety/Judgement: Decreased awareness of safety;Decreased awareness of deficits Awareness: Intellectual Problem Solving: Slow processing;Decreased initiation;Difficulty sequencing;Requires verbal cues;Requires tactile cues General Comments: pt impulsive and starts trying to undo lines/wires at times.  pt thought he was at his daughter's home and needed contextual cueing to determine he was in the hospital.  Very poor awareness of safety and deficits.     General Comments       Exercises       Shoulder Instructions      Home Living Family/patient expects to be discharged to:: Inpatient rehab Living Arrangements: Spouse/significant other Available Help at Discharge: Family;Available 24 hours/day Type of Home: House Home Access: Stairs to enter Entergy CorporationEntrance Stairs-Number of Steps: 6 Entrance Stairs-Rails: Right;Left Home Layout: Two level;1/2 bath on main level Alternate Level Stairs-Number of Steps: flight Alternate Level Stairs-Rails: Left Bathroom Shower/Tub: Producer, television/film/videoWalk-in shower   Bathroom Toilet: Handicapped height Bathroom Accessibility: Yes   Home Equipment: Environmental consultantWalker - 2 wheels;Cane - single point;Crutches          Prior Functioning/Environment Level of Independence: Independent        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    OT Diagnosis: Generalized weakness;Cognitive deficits;Acute pain;Disturbance of vision   OT Problem List: Decreased strength;Decreased activity tolerance;Impaired balance (sitting and/or standing);Impaired  vision/perception;Decreased cognition;Decreased coordination;Decreased safety awareness;Decreased knowledge of use of DME or AE;Impaired UE functional use;Pain   OT Treatment/Interventions: Self-care/ADL training;Therapeutic activities;DME and/or AE instruction;Patient/family education;Balance training;Cognitive remediation/compensation    OT Goals(Current goals can be found in the care plan section) Acute Rehab OT Goals Patient Stated Goal: Per wife for pt to be ambulatory before returning to home. OT Goal Formulation: With patient Time For Goal Achievement: 06/29/15 Potential to Achieve Goals: Good ADL Goals Pt Will Perform Eating: with supervision;sitting Pt Will Perform Grooming: with min assist;standing Pt Will Perform Upper Body Dressing: with min assist;sitting Pt Will Perform Lower Body Dressing: with min assist;sit to/from stand Pt Will Transfer to Toilet: with min assist;stand pivot transfer;bedside commode Pt Will Perform Toileting - Clothing Manipulation and hygiene: with min assist;sit to/from stand Additional ADL Goal #1: Pt will demonstrate sustained attention during ADL.  OT Frequency: Min 3X/week   Barriers to D/C:            Co-evaluation PT/OT/SLP Co-Evaluation/Treatment: Yes Reason for Co-Treatment: Complexity of the patient's impairments (multi-system involvement);Necessary to address cognition/behavior during functional activity;For patient/therapist safety PT goals addressed during session: Mobility/safety with mobility;Balance;Proper use of DME OT goals addressed during session: ADL's and self-care      End of Session Nurse Communication: Mobility status (aware of pain and pt having BM)  Activity Tolerance: Patient limited by pain;Patient limited by fatigue Patient left: in chair;with call bell/phone within reach;with chair alarm set   Time: 1420-1458 OT Time Calculation (min): 38 min Charges:  OT General Charges $OT Visit: 1 Procedure OT  Evaluation $OT Eval High Complexity: 1 Procedure G-Codes:    Evern BioMayberry, Katisha Shimizu Lynn 06/15/2015, 3:29 PM  [phone]

## 2015-06-15 NOTE — Progress Notes (Signed)
Pt continues to get up without asking for help. Order received for soft waist belt from Dr. Patric DykesNunkumar. Discussed with pt's wife Jacqulyn CaneShirley Stotz and pt. Restraint placed.

## 2015-06-15 NOTE — Progress Notes (Signed)
Speech Language Pathology Treatment: Dysphagia  Patient Details Name: Darryl [name] MRN: [id number] DOB: [birth year] Today's Date: 06/15/2015 Time: [id number] SLP Time Calculation (min) (ACUTE ONLY): 10 min  Assessment / Plan / Recommendation Clinical Impression  F/u for dysphagia: pt with improved mastication and effort; able to consume regular solids with improved attention and toleration; min cues provided for left sided pocketing.  No s/s of aspiration with mixed consistencies of solids and thin liquids.  Recommend advancing diet to regular, thin liquids, meds whole in puree.  SLP to follow for toleration.     HPI HPI: 65 [facility] Rt vertebral [facility].He was intubated 1/8-1/10. PMHx of HTN, seizures, DM type II.      SLP Plan  Continue with current plan of care     Recommendations  Diet recommendations: Regular;Thin liquid Liquids provided via: Cup;Straw Medication Administration: Whole meds with puree Supervision: Staff to assist with self feeding Compensations: Slow rate;Small sips/bites             Oral Care Recommendations: Oral care BID Plan: Continue with current plan of care                   Darryl [name] L. [name], [place] CCC/SLP Pager [phone]   [name], [name] 06/15/2015, 12:28 PM

## 2015-06-15 NOTE — Progress Notes (Signed)
Transcranial Doppler  Date POD PCO2 HCT BP  MCA ACA PCA OPHT SIPH VERT Basilar  06/13/15 MS/RS   33.9 137/72 Right  Left   58  66   66  -73   45  50   64  *   61  49   -42  -39   *      06/15/15 JE     Right  Left   104  147   -83  -117   -47  52   *  *   89  53   -35  *   -91           Right  Left                                             Right  Left                                             Right  Left                                            Right  Left                                            Right  Left                                        MCA = Middle Cerebral Artery      OPHT = Opthalmic Artery     BASILAR = Basilar Artery   ACA = Anterior Cerebral Artery     SIPH = Carotid Siphon PCA = Posterior Cerebral Artery   VERT = Verterbral Artery                   Normal MCA = 62+\-12 ACA = 50+\-12 PCA = 42+\-23   *Unable to insonate

## 2015-06-15 NOTE — Evaluation (Signed)
Physical Therapy Evaluation Patient Details Name: Darryl [name] MRN: [id number] DOB: [birth year] Today's Date: 06/15/2015   History of Present Illness  pt presents with R SAH s/p coiling of R Vertebral Artery.  pt intubated 1/8 - 1/10.  pt with hx of HTN, Seizures, and DM.  Clinical Impression  Pt impulsive and needing firm cueing for safety.  With 2 person A pt is able to transfer to/from 3-in-1 and to recliner.  Knees buckling during SPT.  Pt with very poor awareness of deficits/safety, and only oriented to self during session.  Wife present for session and in agreement with the need for further rehab prior to returning to home with her.  Feel pt would benefit from CIR at D/C to maximize independence and decreased burden of care.      Follow Up Recommendations CIR    Equipment Recommendations  None recommended by PT    Recommendations for Other Services Rehab consult     Precautions / Restrictions Precautions Precautions: Fall Precaution Comments: Ventricular Drain Restrictions Weight Bearing Restrictions: No      Mobility  Bed Mobility Overal bed mobility: Needs Assistance;+2 for physical assistance Bed Mobility: Supine to Sit     Supine to sit: Mod assist;+2 for physical assistance;HOB elevated     General bed mobility comments: pt does participate with bed mobility, but stops frequently due to headache pain and leans to R side.    Transfers Overall transfer level: Needs assistance Equipment used: Rolling walker (2 wheeled);2 person hand held assist Transfers: Sit to/from [employer] Sit to Stand: Mod assist;+2 physical assistance Stand pivot transfers: Mod assist;+2 physical assistance       General transfer comment: pt able to perform sit to stand and SPT with and without RW.  cues for UE use and safe technique.  pt initially needing A with movement of LEs through transfer, but after repeated transfers to/from 3-in-1 pt with improved ability to step  LEs around pivot.  pt with definite lean to R side and flexed posture.  .    Ambulation/Gait                Stairs            Wheelchair Mobility    Modified Rankin (Stroke Patients Only) Modified Rankin (Stroke Patients Only) Pre-Morbid Rankin Score: No symptoms Modified Rankin: Severe disability     Balance Overall balance assessment: Needs assistance Sitting-balance support: Bilateral upper extremity supported;Feet supported Sitting balance-Leahy Scale: Fair   Postural control: Right lateral lean Standing balance support: During functional activity Standing balance-Leahy Scale: Poor                               Pertinent Vitals/Pain Pain Assessment: Faces Pain Score: 7  Faces Pain Scale: Hurts whole lot Pain Location: Head Pain Descriptors / Indicators: Headache Pain Intervention(s): Monitored during session;Premedicated before session;Repositioned    Home Living Family/patient expects to be discharged to:: Inpatient rehab Living Arrangements: Spouse/significant other Available Help at Discharge: Family;Available 24 hours/day Type of Home: House Home Access: Stairs to enter Entrance Stairs-Rails: Doctor, general practice of Steps: 6 Home Layout: Two level;1/2 bath on main level Home Equipment: Walker - 2 wheels;Cane - single point;Crutches      Prior Function Level of Independence: Independent         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     Hand Dominance        Extremity/Trunk  Assessment   Upper Extremity Assessment: Defer to OT evaluation           Lower Extremity Assessment: Generalized weakness;LLE deficits/detail;Difficult to assess due to impaired cognition   LLE Deficits / Details: Moving L LE, but groslly weaker than R LE.  Sensation intact.  Cervical / Trunk Assessment: Normal  Communication   Communication: No difficulties  Cognition Arousal/Alertness: Awake/alert Behavior During Therapy:  Impulsive Overall Cognitive Status: Impaired/Different from baseline Area of Impairment: Orientation;Attention;Memory;Following commands;Safety/judgement;Awareness;Problem solving Orientation Level: Disoriented to;Place;Time;Situation Current Attention Level: Sustained Memory: Decreased short-term memory Following Commands: Follows one step commands with increased time;Follows multi-step commands inconsistently Safety/Judgement: Decreased awareness of safety;Decreased awareness of deficits Awareness: Intellectual Problem Solving: Slow processing;Decreased initiation;Difficulty sequencing;Requires verbal cues;Requires tactile cues General Comments: pt impulsive and starts trying to undo lines/wires at times.  pt thought he was at his daughter's home and needed contextual cueing to determine he was in the hospital.  Very poor awareness of safety and deficits.      General Comments      Exercises        Assessment/Plan    PT Assessment Patient needs continued PT services  PT Diagnosis Difficulty walking;Generalized weakness;Hemiplegia non-dominant side;Altered mental status   PT Problem List Decreased strength;Decreased activity tolerance;Decreased balance;Decreased mobility;Decreased coordination;Decreased cognition;Decreased knowledge of use of DME;Decreased safety awareness;Pain  PT Treatment Interventions DME instruction;Gait training;Functional mobility training;Therapeutic activities;Therapeutic exercise;Balance training;Neuromuscular re-education;Cognitive remediation;Patient/family education   PT Goals (Current goals can be found in the Care Plan section) Acute Rehab PT Goals Patient Stated Goal: Per wife for pt to be ambulatory before returning to home. PT Goal Formulation: With family Time For Goal Achievement: 06/29/15 Potential to Achieve Goals: Good    Frequency Min 4X/week   Barriers to discharge        Co-evaluation PT/OT/SLP Co-Evaluation/Treatment: Yes Reason  for Co-Treatment: Complexity of the patient's impairments (multi-system involvement);For patient/therapist safety PT goals addressed during session: Mobility/safety with mobility;Balance;Proper use of DME         End of Session Equipment Utilized During Treatment: Gait belt Activity Tolerance: Patient tolerated treatment well Patient left: in chair;with call bell/phone within reach;with chair alarm set Nurse Communication: Mobility status         Time: [id number] PT Time Calculation (min) (ACUTE ONLY): 38 min   Charges:   PT Evaluation $PT Eval High Complexity: 1 Procedure PT Treatments $Therapeutic Activity: 8-22 mins   PT G CodesSunny Schlein:        Deanne Bedgood F, [place] CarolinaPT [phone] 06/15/2015, 3:19 PM

## 2015-06-16 LAB — GLUCOSE, CAPILLARY
Glucose-Capillary: 151 mg/dL — ABNORMAL HIGH (ref 65–99)
Glucose-Capillary: 139 mg/dL — ABNORMAL HIGH (ref 65–99)
Glucose-Capillary: 186 mg/dL — ABNORMAL HIGH (ref 65–99)
Glucose-Capillary: 159 mg/dL — ABNORMAL HIGH (ref 65–99)

## 2015-06-16 NOTE — Progress Notes (Signed)
Subjective: Patient reports no headache  Objective: Vital signs in last 24 hours: Temp:  [97.8 F (36.6 C)-98.6 F (37 C)] 97.8 F (36.6 C) (01/14 0742) Pulse Rate:  [39-98] 55 (01/14 0700) Resp:  [12-25] 14 (01/14 0700) BP: (129-176)/(65-109) 165/82 mmHg (01/14 0700) SpO2:  [91 %-100 %] 99 % (01/14 0700)  Intake/Output from previous day: 01/13 0701 - 01/14 0700 In: 2600 [I.V.:2400; IV Piggyback:200] Out: 3266 [Urine:3000; Drains:266] Intake/Output this shift:  awake following commands. Slight drift lue, rest wnl  Lab Results: No results for input(s): WBC, HGB, HCT, PLT in the last 72 hours. BMET  Recent Labs  06/15/15 0427  NA 138  K 4.1  CL 105  CO2 19*  GLUCOSE 160*  BUN 15  CREATININE 1.04  CALCIUM 9.0    Studies/Results: No results found.  Assessment/Plan: Continue observation  LOS: 6 days  See above   Jalexis Breed M 06/16/2015, 7:50 AM

## 2015-06-17 LAB — GLUCOSE, CAPILLARY
Glucose-Capillary: 177 mg/dL — ABNORMAL HIGH (ref 65–99)
Glucose-Capillary: 167 mg/dL — ABNORMAL HIGH (ref 65–99)
Glucose-Capillary: 163 mg/dL — ABNORMAL HIGH (ref 65–99)
Glucose-Capillary: 190 mg/dL — ABNORMAL HIGH (ref 65–99)

## 2015-06-17 LAB — TROPONIN I: Troponin I: 0.04 ng/mL — ABNORMAL HIGH (ref <0.031)

## 2015-06-17 MED ORDER — CETYLPYRIDINIUM CHLORIDE 0.05 % MT LIQD
7.0000 mL | Freq: Two times a day (BID) | OROMUCOSAL | Status: DC
  Administered 2015-06-17 – 2015-06-27 (×19): 7 mL via OROMUCOSAL

## 2015-06-17 NOTE — Progress Notes (Signed)
Patient ID: Darryl [name], male   DOB: 1950/07/07, 65 y.o.   MRN: [id number] No complains. Stable.

## 2015-06-17 NOTE — Progress Notes (Signed)
Notified Dr. Lovell SheehanJenkins that patient suddenly started complaining of chest pain.  A few PVCs have started to show on monitor.  Orders received for stat EKG and troponin q6h x3.  Will continue to monitor patient.

## 2015-06-18 ENCOUNTER — Ambulatory Visit (HOSPITAL_COMMUNITY): Payer: BC Managed Care – PPO

## 2015-06-18 DIAGNOSIS — I609 Nontraumatic subarachnoid hemorrhage, unspecified: Secondary | ICD-10-CM

## 2015-06-18 DIAGNOSIS — I10 Essential (primary) hypertension: Secondary | ICD-10-CM

## 2015-06-18 LAB — GLUCOSE, CAPILLARY
Glucose-Capillary: 179 mg/dL — ABNORMAL HIGH (ref 65–99)
Glucose-Capillary: 153 mg/dL — ABNORMAL HIGH (ref 65–99)
Glucose-Capillary: 137 mg/dL — ABNORMAL HIGH (ref 65–99)
Glucose-Capillary: 167 mg/dL — ABNORMAL HIGH (ref 65–99)
Glucose-Capillary: 182 mg/dL — ABNORMAL HIGH (ref 65–99)

## 2015-06-18 LAB — TROPONIN I
Troponin I: 0.03 ng/mL (ref <0.031)
Troponin I: 0.03 ng/mL (ref <0.031)

## 2015-06-18 MED ORDER — DEXAMETHASONE SODIUM PHOSPHATE 4 MG/ML IJ SOLN
4.0000 mg | Freq: Two times a day (BID) | INTRAMUSCULAR | Status: DC
  Administered 2015-06-18 – 2015-06-22 (×8): 4 mg via INTRAVENOUS
  Filled 2015-06-18 (×8): qty 1

## 2015-06-18 MED ORDER — HYDROMORPHONE HCL 2 MG PO TABS
1.0000 mg | ORAL_TABLET | ORAL | Status: DC | PRN
  Administered 2015-06-18 – 2015-06-19 (×4): 1 mg via ORAL
  Filled 2015-06-18 (×4): qty 1

## 2015-06-18 MED ORDER — ENSURE ENLIVE PO LIQD
237.0000 mL | Freq: Two times a day (BID) | ORAL | Status: DC
  Administered 2015-06-18 – 2015-06-27 (×10): 237 mL via ORAL
  Filled 2015-06-18 (×13): qty 237

## 2015-06-18 NOTE — Progress Notes (Signed)
Daughter at bedside. Patient resting comfortably at the moment.

## 2015-06-18 NOTE — Progress Notes (Signed)
PULMONARY / CRITICAL CARE MEDICINE   Name: Claria Dicelonzo Tripoli MRN: [id number] DOB: 1961/06/15    ADMISSION DATE:  06/10/2015 CONSULTATION DATE:  06/10/15  REFERRING MD:  Conchita ParisNundkumar  CHIEF COMPLAINT: Headache  SUBJECTIVE:  Headache present but improving, no further events overnight.  VITAL SIGNS: BP 126/94 mmHg  Pulse 72  Temp(Src) 98.2 F (36.8 C) (Oral)  Resp 16  Ht 5\' 11"  (1.803 m)  Wt 89.6 kg (197 lb 8.5 oz)  BMI 27.56 kg/m2  SpO2 100%  INTAKE / OUTPUT: I/O last 3 completed shifts: In: 3850 [I.V.:3600; IV Piggyback:250] Out: 4516 [Urine:4130; Drains:386]  PHYSICAL EXAMINATION: General: alert and interactive Neuro: follows commands, PERRL, EOM-I and MMM, L>R strength.  EVD in place. HEENT: PERRL, EOM-spontaneous and MMM Cardiac: RRR, Nl S1/S2, -M/R/G. Chest: CTA bilaterally. Abd: Soft, NT, ND and +BS. Ext: no edema and no tenderness. Skin: no rashes noted.  LABS:  BMET  Recent Labs Lab 06/11/15 1730 06/13/15 0353 06/15/15 0427  NA 139 142 138  K 3.6 3.7 4.1  CL 106 111 105  CO2 20* 21* 19*  BUN 10 10 15   CREATININE 1.10 0.99 1.04  GLUCOSE 127* 143* 160*    Electrolytes  Recent Labs Lab 06/11/15 1730 06/13/15 0353 06/15/15 0427  CALCIUM 9.0 8.8* 9.0  MG  --  1.8  --   PHOS  --  2.6  --     CBC  Recent Labs Lab 06/11/15 1730 06/13/15 0353  WBC 11.7* 13.6*  HGB 10.9* 11.4*  HCT 32.4* 33.9*  PLT 257 275    Coag's No results for input(s): APTT, INR in the last 168 hours.  ABG  Recent Labs Lab 06/11/15 1710  PHART 7.487*  PCO2ART 28.6*  PO2ART 125*   Liver Enzymes  Recent Labs Lab 06/11/15 1730 06/13/15 0353  AST 22 22  ALT 17 15*  ALKPHOS 41 41  BILITOT 0.8 0.7  ALBUMIN 3.1* 3.1*   Glucose  Recent Labs Lab 06/16/15 2128 06/17/15 0806 06/17/15 1130 06/17/15 1555 06/17/15 2151 06/18/15 0745  GLUCAP 159* 177* 167* 163* 190* 179*   Imaging No results found.  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  DISCUSSION: 65 [facility]/p [facility] Rt [facility].    He has PMHx of HTN, seizures, DM type II.  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 Hx of seizures. Headache. P:  Continue lamictal, keppra Continue nimodipine >> change to pill form Decadron, EVD per neurosurgery Pain medications as noted  PULMONARY A: Compromised airway in setting of SAH. P:  Monitor oxygenation Monitor for airway protection (passed swallow evaluation).  CARDIOVASCULAR A:  Hx of HTN. 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 P:  F/u BMET intermittently Replace electrolytes as indicated. NS at 100 ml/hr.  GASTROINTESTINAL A:  Nutrition. Dysphagia. P:  Speech therapy appreciated, regular thin liquid diet. Protonix for SUP.  HEMATOLOGIC A:  Anemia of critical illness. P:  F/u CBC intermittently SCDs  for DVT prevention  INFECTIOUS A:  Post-op prophylaxis. P:  Ancef per neurosurgery  ENDOCRINE A:  DM type II. P:  SSI Hold outpt metformin  No family bedside.  Discussed with bedside RN.  Alyson Reedy, M.D. [facility] Pulmonary/Critical Care Medicine. Pager: [phone]. After hours pager: [phone].

## 2015-06-18 NOTE — Progress Notes (Signed)
Physical Therapy Treatment Patient Details Name: Darryl [name] MRN: [id number] DOB: June 27, 1950 Today's Date: 06/18/2015    History of Present Illness pt presents with R SAH s/p coiling of R Vertebral Artery.  pt intubated 1/8 - 1/10.  pt with hx of HTN, Seizures, and DM.    PT Comments    Pt only oriented to self and does not retain orientation information from beginning of session to end of session.  Pt with very limited ability to focus on task and is often resistant to attempts at re-direction.  Pt would benefit from continued therapies to maximize independence and decrease burden of care.    Follow Up Recommendations  CIR     Equipment Recommendations  None recommended by PT    Recommendations for Other Services       Precautions / Restrictions Precautions Precautions: Fall Precaution Comments: Ventricular Drain clamped by RN. Restrictions Weight Bearing Restrictions: No    Mobility  Bed Mobility Overal bed mobility: Needs Assistance;+2 for physical assistance Bed Mobility: Supine to Sit;Sit to Supine     Supine to sit: Max assist;+2 for physical assistance;HOB elevated Sit to supine: Max assist;+2 for physical assistance   General bed mobility comments: pt does attempt to participate in bed mobility, but more limited today due to hurting "all over".    Transfers Overall transfer level: Needs assistance Equipment used: 2 person hand held assist Transfers: Sit to/from Stand Sit to Stand: Max assist;+2 physical assistance         General transfer comment: pt does participate with coming to stand, but does need Bil LEs blocked.  Facilitation for upright posture and trunk extension.  [facility] have [facility], but pt stating he can't.    Ambulation/Gait                 Stairs            Wheelchair Mobility    Modified Rankin (Stroke Patients Only) Modified Rankin (Stroke Patients Only) Pre-Morbid Rankin Score: No  symptoms Modified Rankin: Severe disability     Balance Overall balance assessment: Needs assistance Sitting-balance support: Bilateral upper extremity supported;Feet supported Sitting balance-Leahy Scale: Fair     Standing balance support: Bilateral upper extremity supported;During functional activity Standing balance-Leahy Scale: Poor                      Cognition Arousal/Alertness: Awake/alert Behavior During Therapy: Impulsive Overall Cognitive Status: Impaired/Different from baseline Area of Impairment: Orientation;Attention;Memory;Following commands;Safety/judgement;Awareness;Problem solving Orientation Level: Disoriented to;Place;Time;Situation Current Attention Level: Focused Memory: Decreased short-term memory Following Commands: Follows one step commands with increased time;Follows multi-step commands inconsistently Safety/Judgement: Decreased awareness of safety;Decreased awareness of deficits Awareness: Intellectual Problem Solving: Slow processing;Decreased initiation;Difficulty sequencing;Requires verbal cues;Requires tactile cues General Comments: pt with increased difficulty sistaining attention and focused more on pain.  pt continues to be impulsive and needs frequent consistent cueing for safety.      Exercises      General Comments        Pertinent Vitals/Pain Pain Assessment: Faces Faces Pain Scale: Hurts whole lot Pain Location: "All over" Pain Descriptors / Indicators: Grimacing;Guarding;Moaning Pain Intervention(s): Monitored during session;Limited activity within patient's tolerance;Repositioned;Patient requesting pain meds-RN notified    Home Living                      Prior Function            PT Goals (current goals can  now be found in the care plan section) Acute Rehab PT Goals Patient Stated Goal: Per wife for pt to be ambulatory before returning to home. PT Goal Formulation: With family Time For Goal Achievement:  06/29/15 Potential to Achieve Goals: Good Progress towards PT goals: Progressing toward goals    Frequency  Min 4X/week    PT Plan Current plan remains appropriate    Co-evaluation             End of Session Equipment Utilized During Treatment: Gait belt Activity Tolerance: Patient limited by pain Patient left: in bed;with call bell/phone within reach;with bed alarm set;with family/visitor present;with restraints reapplied (Bed in chair positioning at 45degrees)     Time: 1610-9604 PT Time Calculation (min) (ACUTE ONLY): 22 min  Charges:  $Therapeutic Activity: 8-22 mins                    G CodesSunny Schlein, [place] [phone] 06/18/2015, 10:56 AM

## 2015-06-18 NOTE — Progress Notes (Addendum)
Pt seen and examined. No issues overnight. HA is improving.   EXAM: Temp:  [97.2 F (36.2 C)-98.6 F (37 C)] 97.2 F (36.2 C) (01/16 0746) Pulse Rate:  [45-109] 66 (01/16 1000) Resp:  [13-21] 17 (01/16 1000) BP: (98-171)/(70-107) 149/90 mmHg (01/16 1000) SpO2:  [96 %-100 %] 100 % (01/16 1000) Intake/Output      01/15 0701 - 01/16 0700 01/16 0701 - 01/17 0700   I.V. (mL/kg) 2400 (26.8) 300 (3.3)   IV Piggyback 150    Total Intake(mL/kg) 2550 (28.5) 300 (3.3)   Urine (mL/kg/hr) 2640 (1.2) 435 (1.4)   Drains 241 (0.1) 24 (0.1)   Stool 0 (0)    Total Output 2881 459   Net -331 -159        Stool Occurrence 2 x     Awake, alert, slightly confused Speech fluent Mild RUE/RLE drift, good left strength EVD in place  LABS: Lab Results  Component Value Date   CREATININE 1.04 06/15/2015   BUN 15 06/15/2015   NA 138 06/15/2015   K 4.1 06/15/2015   CL 105 06/15/2015   CO2 19* 06/15/2015   Lab Results  Component Value Date   WBC 13.6* 06/13/2015   HGB 11.4* 06/13/2015   HCT 33.9* 06/13/2015   MCV 76.4* 06/13/2015   PLT 275 06/13/2015    IMPRESSION: - 65 y.o. male SAH d# 9 s/p RVA sacrifice, remains stable  PLAN: - Will raise EVD to 20mmHg - Cont nimotop/TCD monitoring - Cont to mobilize with PT/OT - Wean steroids

## 2015-06-18 NOTE — Progress Notes (Signed)
Transcranial Doppler  Date POD PCO2 HCT BP  MCA ACA PCA OPHT SIPH VERT Basilar  06/13/15 MS/RS   33.9 137/72 Right  Left   58  66   66  -73   45  50   64  *   61  49   -42  -39   *      06/15/15 JE     Right  Left   104  147   -83  -117   -47  52   *  *   89  53   -35  *   -91      06/18/15 VS     Right  Left   133  104   -96  -59   54  63   *  *   *  *   -77  -62     -83          Right  Left                                             Right  Left                                            Right  Left                                            Right  Left                                        MCA = Middle Cerebral Artery      OPHT = Opthalmic Artery     BASILAR = Basilar Artery   ACA = Anterior Cerebral Artery     SIPH = Carotid Siphon PCA = Posterior Cerebral Artery   VERT = Verterbral Artery                   Normal MCA = 62+\-12 ACA = 50+\-12 PCA = 42+\-23   *Unable to insonate Mild technical difficulty due to constant movement of the eyes and forhead

## 2015-06-18 NOTE — Progress Notes (Signed)
Speech Language Pathology Dysphagia Treatment Patient Details Name: Darryl [name] MRN: [id number] DOB: 01/19/1951 Today's Date: 06/18/2015 Time: 1033-1100 SLP Time Calculation (min) (ACUTE ONLY): 27 min  Assessment / Plan / Recommendation Clinical Impression    Pt was presented with regular solids and thin liquids to advise diet advancement and d/c from dysphagia tx. No evidence of pharyngeal dysphagia and no s/s of penetration/aspiration noted.  Patient continues to present with a mild oral dysphagia characterized by mild pocketing of solids in left buccal cavity due to facial weakness, however when given min cues pt performed lingual sweep to effectively clear. Pt partially reclined during session due to back pain and with notable cognitive deficits which SLP suspects are significantly impacting oral transit time, residuals, etc. Overall, oral phase is functional for diet advancement to regular solids with thin liquids. Dysphagia goals met. SLP d/w PT possibility of repositioning or getting out of bed during mealtime to maximize efficiency and intake which has been minimal. Discussed with dietician as well. Pt's daughter was educated re: cognitive deficits and diet advancement. SLE order requested from MD. Cognitive deficits  during session were noted, such as hyperfocus on back pain. Dtr reported strange requests and conversations.    Diet Recommendation    regular, thin liquids   SLP Plan All goals met      Swallowing Goals     General Behavior/Cognition: Alert;Cooperative;Confused;Requires cueing;Distractible Patient Positioning: Partially reclined (due to pain) HPI: 65 yo with severe headache from SAH s/p coiling Rt vertebral artery.He was intubated 1/8-1/10. PMHx of HTN, seizures, DM type II.  Oral Cavity - Oral Hygiene Does patient have any of the following "at risk" factors?: None of the above Patient is LOW RISK: Follow universal precautions (see row information)   Dysphagia  Treatment Family/Caregiver Educated: eldest daughter Treatment Methods: Skilled observation;Upgraded PO texture trial;Patient/caregiver education Patient observed directly with PO's: Yes Type of PO's observed: Regular;Thin liquids Feeding: Able to feed self;Needs assist Liquids provided via: Cup;Straw Oral Phase Signs & Symptoms: Prolonged mastication;Left pocketing Pharyngeal Phase Signs & Symptoms:  (none found) Type of cueing: Verbal Amount of cueing: Minimal   GO       06/18/2015, 11:30 AM      , Student-SLP   

## 2015-06-19 ENCOUNTER — Inpatient Hospital Stay (HOSPITAL_COMMUNITY): Payer: BC Managed Care – PPO

## 2015-06-19 DIAGNOSIS — G9340 Encephalopathy, unspecified: Secondary | ICD-10-CM

## 2015-06-19 LAB — GLUCOSE, CAPILLARY
Glucose-Capillary: 145 mg/dL — ABNORMAL HIGH (ref 65–99)
Glucose-Capillary: 141 mg/dL — ABNORMAL HIGH (ref 65–99)
Glucose-Capillary: 158 mg/dL — ABNORMAL HIGH (ref 65–99)
Glucose-Capillary: 151 mg/dL — ABNORMAL HIGH (ref 65–99)

## 2015-06-19 LAB — CBC
HCT: 39.6 % (ref 39.0–52.0)
Hemoglobin: 13.8 g/dL (ref 13.0–17.0)
MCH: 25.9 pg — ABNORMAL LOW (ref 26.0–34.0)
MCHC: 34.8 g/dL (ref 30.0–36.0)
MCV: 74.3 fL — ABNORMAL LOW (ref 78.0–100.0)
Platelets: 398 10*3/uL (ref 150–400)
RBC: 5.33 MIL/uL (ref 4.22–5.81)
RDW: 13.9 % (ref 11.5–15.5)
WBC: 16.2 10*3/uL — ABNORMAL HIGH (ref 4.0–10.5)

## 2015-06-19 LAB — BASIC METABOLIC PANEL
Anion gap: 11 (ref 5–15)
BUN: 19 mg/dL (ref 6–20)
CO2: 16 mmol/L — ABNORMAL LOW (ref 22–32)
Calcium: 8.7 mg/dL — ABNORMAL LOW (ref 8.9–10.3)
Chloride: 106 mmol/L (ref 101–111)
Creatinine, Ser: 0.83 mg/dL (ref 0.61–1.24)
GFR calc Af Amer: >60 mL/min (ref >60)
GFR calc non Af Amer: >60 mL/min (ref >60)
Glucose, Bld: 193 mg/dL — ABNORMAL HIGH (ref 65–99)
Potassium: 4.4 mmol/L (ref 3.5–5.1)
Sodium: 133 mmol/L — ABNORMAL LOW (ref 135–145)

## 2015-06-19 LAB — MAGNESIUM: Magnesium: 1.9 mg/dL (ref 1.7–2.4)

## 2015-06-19 LAB — PHOSPHORUS: Phosphorus: 3 mg/dL (ref 2.5–4.6)

## 2015-06-19 MED ORDER — HYDROMORPHONE HCL 1 MG/ML IJ SOLN: 1.0000 mg | INTRAMUSCULAR | Status: DC | PRN

## 2015-06-19 MED ORDER — METOCLOPRAMIDE HCL 5 MG/ML IJ SOLN
5.0000 mg | Freq: Once | INTRAMUSCULAR | Status: AC
  Administered 2015-06-19: 5 mg via INTRAVENOUS
  Filled 2015-06-19: qty 2

## 2015-06-19 MED ORDER — HYDROMORPHONE HCL 1 MG/ML IJ SOLN
INTRAMUSCULAR | Status: AC
  Administered 2015-06-19: 1 mg
  Filled 2015-06-19: qty 1

## 2015-06-19 MED ORDER — HYDROMORPHONE HCL 2 MG PO TABS
1.0000 mg | ORAL_TABLET | ORAL | Status: DC | PRN
  Filled 2015-06-19: qty 1

## 2015-06-19 MED ORDER — HYDROMORPHONE HCL 1 MG/ML IJ SOLN
1.0000 mg | INTRAMUSCULAR | Status: DC | PRN
  Administered 2015-06-19: 1 mg via INTRAVENOUS
  Filled 2015-06-19: qty 1

## 2015-06-19 NOTE — Progress Notes (Signed)
Drainage noted at IVC site, which has been clamped since appx 1000.  MD notified, verbal order to reinforce dressing.  4x4 and medipore tape to site, CDI.  Will monitor for drainage.

## 2015-06-19 NOTE — Progress Notes (Signed)
Pt seen and examined. No issues overnight. Has some abd pain and is constipated.  EXAM: Temp:  [97.6 F (36.4 C)-98.6 F (37 C)] 98 F (36.7 C) (01/17 1153) Pulse Rate:  [43-100] 63 (01/17 1200) Resp:  [15-25] 25 (01/17 1200) BP: (112-188)/(70-129) 154/80 mmHg (01/17 1200) SpO2:  [95 %-100 %] 99 % (01/17 1200) Intake/Output      01/16 0701 - 01/17 0700 01/17 0701 - 01/18 0700   P.O. 290    I.V. (mL/kg) 2300 (25.7) 500 (5.6)   IV Piggyback 150    Total Intake(mL/kg) 2740 (30.6) 500 (5.6)   Urine (mL/kg/hr) 2900 (1.3) 800 (1.4)   Drains 86 (0) 0 (0)   Stool 0 (0) 0 (0)   Total Output 2986 800   Net -246 -300        Stool Occurrence 2 x 1 x    Awake, alert Speech fluent Mild RUE/RLE drift, good left strength EVD in place @ 20. Minimal to no output overnight.  LABS: Lab Results  Component Value Date   CREATININE 0.83 06/19/2015   BUN 19 06/19/2015   NA 133* 06/19/2015   K 4.4 06/19/2015   CL 106 06/19/2015   CO2 16* 06/19/2015   Lab Results  Component Value Date   WBC 16.2* 06/19/2015   HGB 13.8 06/19/2015   HCT 39.6 06/19/2015   MCV 74.3* 06/19/2015   PLT 398 06/19/2015    IMPRESSION: - 65 y.o. male SAH d# 10 s/p RVA sacrifice, remains stable - Unchanged after raising EVD yesterday  PLAN: - Will clamp EVD - Cont nimotop/TCD monitoring - Cont to mobilize with PT/OT - Wean steroids

## 2015-06-19 NOTE — Evaluation (Signed)
Speech Language Pathology Evaluation Patient Details Name: Ermon Sagan MRN: [id number] DOB: 1950/11/14 Today's Date: 06/19/2015 Time: 1450-1500 SLP Time Calculation (min) (ACUTE ONLY): 10 min  Problem List:  Patient Active Problem List   Diagnosis Date Noted  . SAH (subarachnoid hemorrhage) (HCC) 06/11/2015  . Emesis   . Subarachnoid hemorrhage (HCC) 06/10/2015  . Hypertension   . Diabetes mellitus without complication [facility])    Past Medical History:  Past Medical History  Diagnosis Date  . Hypertension   . Seizures (HCC)   . Diabetes mellitus without complication [facility])    Past Surgical History:  Past Surgical History  Procedure Laterality Date  . Knee surgery    . Cholecystectomy    . Radiology with anesthesia N/A 06/11/2015    Procedure: RADIOLOGY WITH ANESTHESIA;  Surgeon: Lisbeth Renshaw, MD;  Location: [facility] OR;  Service: Radiology;  Laterality: N/A;   HPI:  65 yo with severe headache from [facility] s/p coiling Rt vertebral artery.He was intubated 1/8-1/10. PMHx of HTN, seizures, DM type II.  Persisting confusion.    Assessment / Plan / Recommendation Clinical Impression  Pt participated in limited initial assessment, but presented with diffuse confusion, confabulation with broad deficits in sustained attention, fluctuating short-term recall, confabulation, poor judgment/insight, and disorientation.  Recommend SLP intervention to address the aforementioned deficits and facilitate independence given high level of functioning PTA.     SLP Assessment  Patient needs continued Speech Lanaguage Pathology Services    Follow Up Recommendations   (tba)    Frequency and Duration min 2x/week  2 weeks      SLP Evaluation Prior Functioning  Cognitive/Linguistic Baseline: Within functional limits Type of Home: House  Lives With: Spouse Available Help at Discharge: Family;Available 24 hours/day Education: Holiday representative from [employer] per pt Vocation: Full time employment  ([employer])   Cognition  Overall Cognitive Status: Impaired/Different from baseline Arousal/Alertness: Awake/alert Orientation Level: Oriented to person;Disoriented to place;Disoriented to time;Disoriented to situation Attention: Focused Focused Attention: Appears intact Memory: Impaired Memory Impairment: Storage deficit;Retrieval deficit Awareness: Impaired Awareness Impairment: Intellectual impairment Problem Solving: Impaired Problem Solving Impairment: Verbal basic Behaviors: Confabulation;Restless;Impulsive Safety/Judgment: Impaired    Comprehension  Auditory Comprehension Overall Auditory Comprehension: Appears within functional limits for tasks assessed Visual Recognition/Discrimination Discrimination: Not tested Reading Comprehension Reading Status: Not tested    Expression Expression Primary Mode of Expression: Verbal Verbal Expression Overall Verbal Expression: Appears within functional limits for tasks assessed Written Expression Dominant Hand: Right   Oral / Motor  Oral Motor/Sensory Function Overall Oral Motor/Sensory Function: Within functional limits Motor Speech Overall Motor Speech: Appears within functional limits for tasks assessed   GO                    Blenda Mounts Laurice 06/19/2015, 5:44 PM

## 2015-06-19 NOTE — Progress Notes (Signed)
OT Cancellation Note  Patient Details Name: Avett Reineck MRN: [id number] DOB: 1950-08-05   Cancelled Treatment:    Reason Eval/Treat Not Completed: Other (comment) Pt restless. Increased drainage from ventri drain site. Will attempt to see tomorrow.  [facility] Daymen Hassebrock, OTR/L  [phone] 06/19/2015 06/19/2015, 3:43 PM

## 2015-06-19 NOTE — Progress Notes (Signed)
Please order an inpatient rehab consultation per therapy  Recommendations. [phone]

## 2015-06-19 NOTE — Progress Notes (Signed)
PULMONARY / CRITICAL CARE MEDICINE   Name: Darryl [name] MRN: [id number] DOB: 26-Sep-1950    ADMISSION DATE:  06/10/2015 CONSULTATION DATE:  06/10/15  REFERRING MD:  Conchita Paris  CHIEF COMPLAINT: Headache  SUBJECTIVE:  Headache present but improving, no further events overnight.  VITAL SIGNS: BP 170/97 mmHg  Pulse 61  Temp(Src) 98 F (36.7 C) (Oral)  Resp 17  Ht  (1.803 m)  Wt 89.6 kg (197 lb 8.5 oz)  BMI 27.56 kg/m2  SpO2 96%  INTAKE / OUTPUT: I/O last 3 completed shifts: In: 4040 [P.O.:290; I.V.:3500; IV Piggyback:250] Out: 4454 [Urine:4255; Drains:199]  PHYSICAL EXAMINATION: General: alert and interactive Neuro: follows commands, PERRL, EOM-I and MMM, L>R strength.  EVD in place. HEENT: PERRL, EOM-spontaneous and MMM Cardiac: RRR, Nl S1/S2, -M/R/G. Chest: CTA bilaterally. Abd: Soft, NT, ND and +BS. Ext: no edema and no tenderness. Skin: no rashes noted.  LABS:  BMET  Recent Labs Lab 06/13/15 0353 06/15/15 0427 06/19/15 0306  NA 142 138 133*  K 3.7 4.1 4.4  CL 111 105 106  CO2 21* 19* 16*  BUN CREATININE 0.99 1.04 0.83  GLUCOSE 143* 160* 193*    Electrolytes  Recent Labs Lab 06/13/15 0353 06/15/15 0427 06/19/15 0306  CALCIUM 8.8* 9.0 8.7*  MG 1.8  --  1.9  PHOS 2.6  --  3.0    CBC  Recent Labs Lab 06/13/15 0353 06/19/15 0306  WBC 13.6* 16.2*  HGB 11.4* 13.8  HCT 33.9* 39.6  PLT 275 398    Coag's No results for input(s): APTT, INR in the last 168 hours.  ABG No results for input(s): PHART, PCO2ART, PO2ART in the last 168 hours. Liver Enzymes  Recent Labs Lab 06/13/15 0353  AST 22  ALT 15*  ALKPHOS 41  BILITOT 0.7  ALBUMIN 3.1*   Glucose  Recent Labs Lab 06/17/15 2151 06/18/15 0745 06/18/15 1207 06/18/15 1706 06/18/15 2042 06/18/15 2201  GLUCAP 190* 179* 153* 137* 167* 182*   Imaging No results found.  STUDIES:  CT head 1/8 >> diffuse SAH, developing hydrocephalus CT Angio Head 1/8 >> distal Rt  V4 aneurysm  CULTURES: 1/8 Urine >> negative  ANTIBIOTICS: 1/8 Ancef >>   SIGNIFICANT EVENTS: 1/8 Admit for [facility], intubated 1/9 Cerebral angio with right vertebral artery coiling  LINES/TUBES: Right ventricular drain 1/9 >> Left Radial A-line 1/9 >> 1/10  ETT 1/9 >> 1/10  I reviewed CXR myself, no evidence of acute disease.  DISCUSSION: 65 yo with severe headache from [facility] s/p coiling Rt vertebral artery.    He has PMHx of HTN, seizures, DM type II.  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 Hx of seizures. Headache. P:  Continue lamictal, keppra Continue nimodipine >> change to pill form Decadron, IVD per neurosurgery D/C IV dilaudid.  PULMONARY A: Compromised airway in setting of SAH. P:  Monitor oxygenation Monitor for airway protection (passed swallow evaluation) and diet started.  CARDIOVASCULAR A:  Hx of HTN. P:  Goal SBP < 160 per neurosurgery. Hold outpt hyzaar. Cont nimotop/TCD monitoring.  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 P:  F/u BMET intermittently. Replace electrolytes as indicated.. NS at 100 ml/hr per neurosurgery. D/C scheduled potassium.  GASTROINTESTINAL A:  Nutrition. Dysphagia. P:  Diet as ordered. Protonix for SUP.  HEMATOLOGIC A:  Anemia of critical illness. P:  F/u CBC intermittently. SCDs for DVT prevention but patient continues to refuse, family aware.  INFECTIOUS  A:  Post-op prophylaxis. P:  Ancef per neurosurgery  ENDOCRINE A:  DM type II. P:  SSI Hold outpt metformin  Family updated bedside.  Discussed with bedside RN.  Alyson Reedy, M.D. [facility] Pulmonary/Critical Care Medicine. Pager: [phone]. After hours pager: [phone].

## 2015-06-20 ENCOUNTER — Inpatient Hospital Stay (HOSPITAL_COMMUNITY): Payer: BC Managed Care – PPO

## 2015-06-20 DIAGNOSIS — I609 Nontraumatic subarachnoid hemorrhage, unspecified: Secondary | ICD-10-CM

## 2015-06-20 DIAGNOSIS — R140 Abdominal distension (gaseous): Secondary | ICD-10-CM

## 2015-06-20 LAB — GLUCOSE, CAPILLARY
Glucose-Capillary: 211 mg/dL — ABNORMAL HIGH (ref 65–99)
Glucose-Capillary: 195 mg/dL — ABNORMAL HIGH (ref 65–99)
Glucose-Capillary: 184 mg/dL — ABNORMAL HIGH (ref 65–99)
Glucose-Capillary: 187 mg/dL — ABNORMAL HIGH (ref 65–99)

## 2015-06-20 LAB — PHOSPHORUS: Phosphorus: 3.2 mg/dL (ref 2.5–4.6)

## 2015-06-20 LAB — BASIC METABOLIC PANEL
Anion gap: 12 (ref 5–15)
BUN: 19 mg/dL (ref 6–20)
CO2: 17 mmol/L — ABNORMAL LOW (ref 22–32)
Calcium: 9.1 mg/dL (ref 8.9–10.3)
Chloride: 107 mmol/L (ref 101–111)
Creatinine, Ser: 0.81 mg/dL (ref 0.61–1.24)
GFR calc Af Amer: >60 mL/min (ref >60)
GFR calc non Af Amer: >60 mL/min (ref >60)
Glucose, Bld: 208 mg/dL — ABNORMAL HIGH (ref 65–99)
Potassium: 4.1 mmol/L (ref 3.5–5.1)
Sodium: 136 mmol/L (ref 135–145)

## 2015-06-20 LAB — CBC
HCT: 42 % (ref 39.0–52.0)
Hemoglobin: 15.1 g/dL (ref 13.0–17.0)
MCH: 26.7 pg (ref 26.0–34.0)
MCHC: 36 g/dL (ref 30.0–36.0)
MCV: 74.2 fL — ABNORMAL LOW (ref 78.0–100.0)
Platelets: 420 10*3/uL — ABNORMAL HIGH (ref 150–400)
RBC: 5.66 MIL/uL (ref 4.22–5.81)
RDW: 14.5 % (ref 11.5–15.5)
WBC: 20.5 10*3/uL — ABNORMAL HIGH (ref 4.0–10.5)

## 2015-06-20 LAB — MAGNESIUM: Magnesium: 1.9 mg/dL (ref 1.7–2.4)

## 2015-06-20 MED ORDER — HYDROMORPHONE HCL 2 MG PO TABS
1.0000 mg | ORAL_TABLET | ORAL | Status: DC | PRN
  Administered 2015-06-20: 1 mg via ORAL

## 2015-06-20 MED ORDER — MAGNESIUM SULFATE 2 GM/50ML IV SOLN
2.0000 g | Freq: Once | INTRAVENOUS | Status: AC
  Administered 2015-06-20: 2 g via INTRAVENOUS
  Filled 2015-06-20: qty 50

## 2015-06-20 MED ORDER — MEPERIDINE HCL 25 MG/ML IJ SOLN: 12.5000 mg | INTRAMUSCULAR | Status: DC | PRN

## 2015-06-20 MED ORDER — LUBIPROSTONE 24 MCG PO CAPS
24.0000 ug | ORAL_CAPSULE | Freq: Two times a day (BID) | ORAL | Status: DC
  Administered 2015-06-20 – 2015-06-25 (×9): 24 ug via ORAL
  Filled 2015-06-20 (×13): qty 1

## 2015-06-20 MED ORDER — HYDROMORPHONE HCL 1 MG/ML IJ SOLN
1.0000 mg | INTRAMUSCULAR | Status: DC | PRN
  Administered 2015-06-20: 1 mg via INTRAVENOUS
  Filled 2015-06-20: qty 1

## 2015-06-20 MED ORDER — HYDROMORPHONE HCL 2 MG PO TABS
1.0000 mg | ORAL_TABLET | ORAL | Status: DC | PRN
  Administered 2015-06-20 – 2015-06-21 (×3): 1 mg via ORAL
  Filled 2015-06-20 (×3): qty 1

## 2015-06-20 NOTE — Progress Notes (Signed)
   06/20/15 1223  Clinical Encounter Type  Visited With Patient;Health care provider  Visit Type Initial   Unit chaplain introduced spiritual care services to the patient. Patient indicated he has a nephew here in the hospital that he would like me to visit, but chaplain could not find the nephew in our system. Patient also indicated that he would like to rest, so we didn't talk much. Chaplain support available as needed.   Alda Ponder, Chaplain 06/20/2015 12:26 PM

## 2015-06-20 NOTE — Progress Notes (Signed)
Transcranial Doppler  Date POD PCO2 HCT BP  MCA ACA PCA OPHT SIPH VERT Basilar  06/13/15 MS/RS   33.9 137/72 Right  Left   58  66   66  -73   45  50   64  *   61  49   -42  -39   *      06/15/15 JE     Right  Left   104  147   -83  -117   -47  52   *  *   89  53   -35  *   -91      06/18/15 VS     Right  Left   133  104   -96  -59   54  63   *  *   *  *   -77  -62     -83    06/20/15 JE      Right  Left   95  65   -32  -57   46  31   30  *   75  92   -36  -32   *            Right  Left                                            Right  Left                                            Right  Left                                        MCA = Middle Cerebral Artery      OPHT = Opthalmic Artery     BASILAR = Basilar Artery   ACA = Anterior Cerebral Artery     SIPH = Carotid Siphon PCA = Posterior Cerebral Artery   VERT = Verterbral Artery                   Normal MCA = 62+\-12 ACA = 50+\-12 PCA = 42+\-23   *Unable to insonate Mild technical difficulty due to constant movement of the eyes and forhead

## 2015-06-20 NOTE — Progress Notes (Signed)
PULMONARY / CRITICAL CARE MEDICINE   Name: Darryl [name] MRN: [id number] DOB: Mar 29, 1951    ADMISSION DATE:  06/10/2015 CONSULTATION DATE:  06/10/15  REFERRING MD:  Conchita Paris  CHIEF COMPLAINT: Headache  SUBJECTIVE:  Headache present but improving, no further events overnight.  VITAL SIGNS: BP 143/88 mmHg  Pulse 75  Temp(Src) 97.5 F (36.4 C) (Oral)  Resp 28  Ht  (1.803 m)  Wt 89.6 kg (197 lb 8.5 oz)  BMI 27.56 kg/m2  SpO2 100%  INTAKE / OUTPUT: I/O last 3 completed shifts: In: 3990 [P.O.:240; I.V.:3550; IV Piggyback:200] Out: 3524 [Urine:3505; Drains:19]  PHYSICAL EXAMINATION: General: alert and interactive Neuro: follows commands, PERRL, EOM-I and MMM, L>R strength.  EVD in place. HEENT: PERRL, EOM-spontaneous and MMM Cardiac: RRR, Nl S1/S2, -M/R/G. Chest: CTA bilaterally. Abd: Soft, NT, distended and +BS. Ext: no edema and no tenderness. Skin: no rashes noted.  LABS:  BMET  Recent Labs Lab 06/15/15 0427 06/19/15 0306 06/20/15 0508  NA 138 133* 136  K 4.1 4.4 4.1  CL 105 106 107  CO2 19* 16* 17*  BUN CREATININE 1.04 0.83 0.81  GLUCOSE 160* 193* 208*    Electrolytes  Recent Labs Lab 06/15/15 0427 06/19/15 0306 06/20/15 0508  CALCIUM 9.0 8.7* 9.1  MG  --  1.9 1.9  PHOS  --  3.0 3.2    CBC  Recent Labs Lab 06/19/15 0306 06/20/15 0743  WBC 16.2* 20.5*  HGB 13.8 15.1  HCT 39.6 42.0  PLT 398 420*    Coag's No results for input(s): APTT, INR in the last 168 hours.  ABG No results for input(s): PHART, PCO2ART, PO2ART in the last 168 hours. Liver Enzymes No results for input(s): AST, ALT, ALKPHOS, BILITOT, ALBUMIN in the last 168 hours. Glucose  Recent Labs Lab 06/18/15 2201 06/19/15 0748 06/19/15 1144 06/19/15 1716 06/19/15 2147 06/20/15 0834  GLUCAP 182* 145* 141* 158* 151* 211*   Imaging Dg Abd 1 View  06/19/2015  CLINICAL DATA:  Acute onset abdominal pain and discomfort EXAM: ABDOMEN - 1 VIEW COMPARISON:   None. FINDINGS: Colon is diffusely dilated with air. There is no appreciable small bowel dilatation. No free air is apparent on this supine examination. There are surgical clips the right upper quadrant. IMPRESSION: Question a degree of colonic ileus. Obstruction not felt to be likely. No free air is apparent on this supine examination. Electronically Signed   By: Bretta Bang III M.D.   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  DISCUSSION: 65 yo with severe headache from [facility] s/p coiling Rt vertebral artery.    He has PMHx of HTN, seizures, DM type II.  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 Hx of seizures. Headache. P:  Continue lamictal, keppra Continue nimodipine >> changed to pill form Decadron, IVD per neurosurgery D/C IV dilaudid. Start IV demerol to avoid bowel concerns.  PULMONARY A: Compromised airway in setting of SAH. P:  Monitor oxygenation Monitor for airway protection (passed swallow evaluation) and diet started.  CARDIOVASCULAR A:  Hx of HTN. P:  Goal SBP < 160 per neurosurgery. Hold outpt hyzaar. Cont nimotop/TCD monitoring.  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 P:  F/u BMET intermittently. Replace electrolytes as indicated.. NS at 100 ml/hr per neurosurgery. D/C scheduled potassium.  GASTROINTESTINAL A:  Nutrition. Dysphagia. P:  NPO for now. D/C dilaudid and use demerol. Reglan. Protonix for SUP.  HEMATOLOGIC A:  Anemia of critical illness. P:  F/u CBC  intermittently. SCDs for DVT prevention but patient continues to refuse, family aware.  INFECTIOUS A:  Post-op prophylaxis. P:  Ancef per neurosurgery  ENDOCRINE A:  DM type II. P:  SSI Hold outpt metformin  Patient updated bedside.  Discussed with bedside RN.  Alyson Reedy, M.D. [facility] Pulmonary/Critical Care Medicine. Pager: [phone]. After hours pager: [phone].

## 2015-06-20 NOTE — Progress Notes (Signed)
Physical Therapy Treatment Patient Details Name: Darryl [name] MRN: [id number] DOB: 06/24/1950 Today's Date: 06/20/2015    History of Present Illness pt presents with R SAH s/p coiling of R Vertebral Artery.  pt intubated 1/8 - 1/10.  pt with hx of HTN, Seizures, and DM.    PT Comments    Pt continues to be limited by cognitive deficits and pain during mobility.  Pt is difficult to keep on task and requires mitts left on during session due to frequently reaching out for objects/lines.  Continue to feel pt would benefit from CIR level of therapies at D/C.  Will continue to follow.    Follow Up Recommendations  CIR     Equipment Recommendations  None recommended by PT    Recommendations for Other Services       Precautions / Restrictions Precautions Precautions: Fall Precaution Comments: Ventricular Drain clamped by RN. Restrictions Weight Bearing Restrictions: No    Mobility  Bed Mobility Overal bed mobility: Needs Assistance;+2 for physical assistance Bed Mobility: Supine to Sit;Sit to Supine     Supine to sit: Max assist;+2 for physical assistance;HOB elevated Sit to supine: Max assist;+2 for physical assistance   General bed mobility comments: After multiple cues and encouragement pt does participate in coming to sitting EOB, but easily distracted during mobility.    Transfers                    Ambulation/Gait                 Stairs            Wheelchair Mobility    Modified Rankin (Stroke Patients Only) Modified Rankin (Stroke Patients Only) Pre-Morbid Rankin Score: No symptoms Modified Rankin: Severe disability     Balance Overall balance assessment: Needs assistance Sitting-balance support: Bilateral upper extremity supported;Feet supported Sitting balance-Leahy Scale: Poor                              Cognition Arousal/Alertness: Awake/alert Behavior During Therapy: Impulsive Overall Cognitive Status:  Impaired/Different from baseline Area of Impairment: Orientation;Attention;Memory;Following commands;Safety/judgement;Awareness;Problem solving Orientation Level: Disoriented to;Place;Time;Situation Current Attention Level: Focused Memory: Decreased short-term memory;Decreased recall of precautions Following Commands: Follows one step commands inconsistently;Follows one step commands with increased time Safety/Judgement: Decreased awareness of safety;Decreased awareness of deficits Awareness: Intellectual Problem Solving: Slow processing;Decreased initiation;Difficulty sequencing;Requires verbal cues;Requires tactile cues General Comments: pt continues to have difficulty attending for more than seconds at a time.  pt needs frequent, firm cues for safety and participation.  pt continues to be oriented to person only.      Exercises      General Comments        Pertinent Vitals/Pain Pain Assessment: Faces Faces Pain Scale: Hurts whole lot Pain Location: Initially pt denied pain, but then indicates pain in stomach, head, back, Bil shoulders. Pain Descriptors / Indicators: Grimacing;Guarding;Moaning Pain Intervention(s): Limited activity within patient's tolerance;Monitored during session;Premedicated before session;Repositioned    Home Living                      Prior Function            PT Goals (current goals can now be found in the care plan section) Acute Rehab PT Goals Patient Stated Goal: Per wife for pt to be ambulatory before returning to home. PT Goal Formulation: With family Time For Goal Achievement: 06/29/15 Potential to Achieve  Goals: Good Progress towards PT goals: Progressing toward goals    Frequency  Min 4X/week    PT Plan Current plan remains appropriate    Co-evaluation             End of Session   Activity Tolerance: Patient limited by pain;Other (comment) (Limited by cognition) Patient left: in bed;with call bell/phone within  reach;with bed alarm set;with restraints reapplied     Time: 6045-4098 PT Time Calculation (min) (ACUTE ONLY): 19 min  Charges:  $Therapeutic Activity: 8-22 mins                    G CodesSunny Schlein, [place] [phone] 06/20/2015, 11:47 AM

## 2015-06-20 NOTE — Progress Notes (Signed)
Pt seen and examined. No issues overnight. Has had abd pain since yesterday. Mild HA persists. Did have an episode of drainage from the EVD site yesterday afternoon, nothing since then.  EXAM: Temp:  [97.2 F (36.2 C)-97.9 F (36.6 C)] 97.5 F (36.4 C) (01/18 0753) Pulse Rate:  [56-94] 75 (01/18 1000) Resp:  [18-31] 28 (01/18 1000) BP: (118-177)/(72-144) 143/88 mmHg (01/18 1000) SpO2:  [95 %-100 %] 100 % (01/18 1000) Intake/Output      01/17 0701 - 01/18 0700 01/18 0701 - 01/19 0700   P.O.     I.V. (mL/kg) 2400 (26.8) 300 (3.3)   IV Piggyback 100    Total Intake(mL/kg) 2500 (27.9) 300 (3.3)   Urine (mL/kg/hr) 2235 (1) 650 (1.5)   Drains 0 (0)    Stool 0 (0)    Total Output 2235 650   Net +265 -350        Urine Occurrence 1 x    Stool Occurrence 4 x     Awake, alert CN grossly intact Right drift, good strength EVD in place, clamped.  LABS: Lab Results  Component Value Date   CREATININE 0.81 06/20/2015   BUN 19 06/20/2015   NA 136 06/20/2015   K 4.1 06/20/2015   CL 107 06/20/2015   CO2 17* 06/20/2015   Lab Results  Component Value Date   WBC 20.5* 06/20/2015   HGB 15.1 06/20/2015   HCT 42.0 06/20/2015   MCV 74.2* 06/20/2015   PLT 420* 06/20/2015     IMPRESSION: - 65 y.o. male SAH d# 11 s/p RVA sacrifice, neurologically stable - Possible ileus - ventric remains clamped, neurologically unchanged  PLAN: - Will leave EVD clamped, repeat scan in am and possibly D/C - Cont observation - Keep NPO for now, may need to try to replace NGT - Mobilize as tolerated

## 2015-06-21 ENCOUNTER — Inpatient Hospital Stay (HOSPITAL_COMMUNITY): Payer: BC Managed Care – PPO

## 2015-06-21 ENCOUNTER — Encounter (HOSPITAL_COMMUNITY): Payer: Self-pay | Admitting: Radiology

## 2015-06-21 DIAGNOSIS — F502 Bulimia nervosa: Secondary | ICD-10-CM

## 2015-06-21 DIAGNOSIS — R109 Unspecified abdominal pain: Secondary | ICD-10-CM | POA: Insufficient documentation

## 2015-06-21 LAB — CBC WITH DIFFERENTIAL/PLATELET
Basophils Absolute: 0 10*3/uL (ref 0.0–0.1)
Basophils Relative: 0 %
Eosinophils Absolute: 0 10*3/uL (ref 0.0–0.7)
Eosinophils Relative: 0 %
HCT: 39.4 % (ref 39.0–52.0)
Hemoglobin: 14.4 g/dL (ref 13.0–17.0)
Lymphocytes Relative: 7 %
Lymphs Abs: 1.5 10*3/uL (ref 0.7–4.0)
MCH: 27 pg (ref 26.0–34.0)
MCHC: 36.5 g/dL — ABNORMAL HIGH (ref 30.0–36.0)
MCV: 73.8 fL — ABNORMAL LOW (ref 78.0–100.0)
Monocytes Absolute: 0.4 10*3/uL (ref 0.1–1.0)
Monocytes Relative: 2 %
Neutro Abs: 19.1 10*3/uL — ABNORMAL HIGH (ref 1.7–7.7)
Neutrophils Relative %: 91 %
Platelets: 389 10*3/uL (ref 150–400)
RBC: 5.34 MIL/uL (ref 4.22–5.81)
RDW: 14.5 % (ref 11.5–15.5)
WBC: 21 10*3/uL — ABNORMAL HIGH (ref 4.0–10.5)

## 2015-06-21 LAB — CBC
HCT: 37.2 % — ABNORMAL LOW (ref 39.0–52.0)
Hemoglobin: 13.3 g/dL (ref 13.0–17.0)
MCH: 26.3 pg (ref 26.0–34.0)
MCHC: 35.8 g/dL (ref 30.0–36.0)
MCV: 73.7 fL — ABNORMAL LOW (ref 78.0–100.0)
Platelets: 385 10*3/uL (ref 150–400)
RBC: 5.05 MIL/uL (ref 4.22–5.81)
RDW: 14.3 % (ref 11.5–15.5)
WBC: 22.5 10*3/uL — ABNORMAL HIGH (ref 4.0–10.5)

## 2015-06-21 LAB — PHOSPHORUS: Phosphorus: 2.7 mg/dL (ref 2.5–4.6)

## 2015-06-21 LAB — GLUCOSE, CAPILLARY
Glucose-Capillary: 175 mg/dL — ABNORMAL HIGH (ref 65–99)
Glucose-Capillary: 185 mg/dL — ABNORMAL HIGH (ref 65–99)
Glucose-Capillary: 192 mg/dL — ABNORMAL HIGH (ref 65–99)
Glucose-Capillary: 147 mg/dL — ABNORMAL HIGH (ref 65–99)

## 2015-06-21 LAB — URINALYSIS, ROUTINE W REFLEX MICROSCOPIC
Bilirubin Urine: NEGATIVE
Glucose, UA: NEGATIVE mg/dL
Ketones, ur: NEGATIVE mg/dL
Leukocytes, UA: NEGATIVE
Nitrite: POSITIVE — AB
Protein, ur: 100 mg/dL — AB
Specific Gravity, Urine: 1.02 (ref 1.005–1.030)
pH: 6 (ref 5.0–8.0)

## 2015-06-21 LAB — MAGNESIUM: Magnesium: 2.1 mg/dL (ref 1.7–2.4)

## 2015-06-21 LAB — BASIC METABOLIC PANEL
Anion gap: 12 (ref 5–15)
BUN: 23 mg/dL — ABNORMAL HIGH (ref 6–20)
CO2: 17 mmol/L — ABNORMAL LOW (ref 22–32)
Calcium: 8.7 mg/dL — ABNORMAL LOW (ref 8.9–10.3)
Chloride: 110 mmol/L (ref 101–111)
Creatinine, Ser: 0.83 mg/dL (ref 0.61–1.24)
GFR calc Af Amer: >60 mL/min (ref >60)
GFR calc non Af Amer: >60 mL/min (ref >60)
Glucose, Bld: 200 mg/dL — ABNORMAL HIGH (ref 65–99)
Potassium: 3.6 mmol/L (ref 3.5–5.1)
Sodium: 139 mmol/L (ref 135–145)

## 2015-06-21 LAB — URINE MICROSCOPIC-ADD ON

## 2015-06-21 MED ORDER — POTASSIUM CHLORIDE 10 MEQ/100ML IV SOLN
10.0000 meq | INTRAVENOUS | Status: AC
  Administered 2015-06-21 (×2): 10 meq via INTRAVENOUS
  Filled 2015-06-21 (×2): qty 100

## 2015-06-21 MED ORDER — HYDROCODONE-ACETAMINOPHEN 5-325 MG PO TABS
2.0000 | ORAL_TABLET | ORAL | Status: DC | PRN
  Administered 2015-06-21 – 2015-06-27 (×16): 2 via ORAL
  Filled 2015-06-21 (×17): qty 2

## 2015-06-21 MED ORDER — SIMVASTATIN 20 MG PO TABS
20.0000 mg | ORAL_TABLET | Freq: Every day | ORAL | Status: DC
  Administered 2015-06-21 – 2015-06-26 (×5): 20 mg via ORAL
  Filled 2015-06-21 (×5): qty 1

## 2015-06-21 NOTE — Progress Notes (Signed)
Physical Therapy Treatment Patient Details Name: Darryl [name] MRN: [id number] DOB: 04-09-1951 Today's Date: 06/21/2015    History of Present Illness pt presents with R SAH s/p coiling of R Vertebral Artery.  pt intubated 1/8 - 1/10.  pt with hx of HTN, Seizures, and DM.    PT Comments    Pt seems less painful today and better able to participate in mobility.  Pt still with very limited attention and awareness, but did follow some cueing during mobility.  Pt able to stand today with 2 person A.  Continue to feel pt would be a candidate for CIR at D/C.  Will continue to follow.    Follow Up Recommendations  CIR     Equipment Recommendations  None recommended by PT    Recommendations for Other Services       Precautions / Restrictions Precautions Precautions: Fall Precaution Comments: Ventricular Drain clamped by RN. Restrictions Weight Bearing Restrictions: No    Mobility  Bed Mobility Overal bed mobility: Needs Assistance;+2 for physical assistance Bed Mobility: Supine to Sit;Sit to Supine     Supine to sit: Max assist;+2 for physical assistance Sit to supine: Max assist;+2 for physical assistance   General bed mobility comments: pt needs cues and facilitation for technique, but better participation today.    Transfers Overall transfer level: Needs assistance Equipment used: 2 person hand held assist Transfers: Sit to/from Stand Sit to Stand: Max assist;+2 physical assistance         General transfer comment: pt agreeable to attempt standing today and did participate with coming to standing.  pt able to stand for ~70mins prior to needing to sit.  Bil LEs blocked.    Ambulation/Gait                 Stairs            Wheelchair Mobility    Modified Rankin (Stroke Patients Only) Modified Rankin (Stroke Patients Only) Pre-Morbid Rankin Score: No symptoms Modified Rankin: Severe disability     Balance Overall balance assessment: Needs  assistance Sitting-balance support: Bilateral upper extremity supported;Feet supported Sitting balance-Leahy Scale: Poor     Standing balance support: During functional activity Standing balance-Leahy Scale: Poor                      Cognition Arousal/Alertness: Awake/alert Behavior During Therapy: Impulsive Overall Cognitive Status: Impaired/Different from baseline Area of Impairment: Orientation;Attention;Memory;Following commands;Safety/judgement;Awareness;Problem solving Orientation Level: Disoriented to;Place;Time;Situation Current Attention Level: Focused Memory: Decreased recall of precautions;Decreased short-term memory Following Commands: Follows one step commands inconsistently;Follows one step commands with increased time Safety/Judgement: Decreased awareness of safety;Decreased awareness of deficits Awareness: Intellectual Problem Solving: Slow processing;Decreased initiation;Difficulty sequencing;Requires verbal cues;Requires tactile cues General Comments: pt does have some improvement in ability to follow directions today.  When pt was told he was in a hospital in [place], he was able to come up with the name [facility], but pror to PT providing cues he thought we were in "the [facility]".      Exercises      General Comments        Pertinent Vitals/Pain Pain Assessment: Faces (Simultaneous filing. User may not have seen previous data.) Faces Pain Scale: Hurts even more (Simultaneous filing. User may not have seen previous data.) Pain Location: mainly back pain, RN aware (Simultaneous filing. User may not have seen previous data.) Pain Descriptors / Indicators: Grimacing (Simultaneous filing. User may not have seen previous data.) Pain Intervention(s): Limited activity  within patient's tolerance (Simultaneous filing. User may not have seen previous data.)    Home Living                      Prior Function            PT Goals (current goals  can now be found in the care plan section) Acute Rehab PT Goals Patient Stated Goal: Per wife for pt to be ambulatory before returning to home. PT Goal Formulation: With family Time For Goal Achievement: 06/29/15 Potential to Achieve Goals: Good Progress towards PT goals: Progressing toward goals    Frequency  Min 3X/week    PT Plan Frequency needs to be updated    Co-evaluation             End of Session Equipment Utilized During Treatment: Gait belt Activity Tolerance: Patient limited by fatigue;Patient limited by pain Patient left: in bed;with call bell/phone within reach;with bed alarm set;with restraints reapplied     Time: 6962-9528 PT Time Calculation (min) (ACUTE ONLY): 27 min  Charges:  $Therapeutic Activity: 23-37 mins                    G CodesSunny Schlein, [place] [phone] 06/21/2015, 11:16 AM

## 2015-06-21 NOTE — Progress Notes (Signed)
Pt seen and examined. No issues overnight. Has abd pain, although appears a little better today  EXAM: Temp:  [97.5 F (36.4 C)-98 F (36.7 C)] 97.6 F (36.4 C) (01/19 1154) Pulse Rate:  [55-112] 66 (01/19 1300) Resp:  [15-27] 19 (01/19 1300) BP: (108-169)/(72-134) 137/72 mmHg (01/19 1300) SpO2:  [96 %-100 %] 97 % (01/19 1300) Intake/Output      01/18 0701 - 01/19 0700 01/19 0701 - 01/20 0700   I.V. (mL/kg) 2400 (26.8) 400 (4.5)   IV Piggyback 200    Total Intake(mL/kg) 2600 (29) 400 (4.5)   Urine (mL/kg/hr) 2325 (1.1) 650 (0.9)   Drains     Stool 0 (0) 0 (0)   Total Output 2325 650   Net +275 -250        Stool Occurrence 5 x 1 x    Awake, alert, slightly confused CN intact Good strength, mild RUE/RLE drift  LABS: Lab Results  Component Value Date   CREATININE 0.83 06/21/2015   BUN 23* 06/21/2015   NA 139 06/21/2015   K 3.6 06/21/2015   CL 110 06/21/2015   CO2 17* 06/21/2015   Lab Results  Component Value Date   WBC 21.0* 06/21/2015   HGB 14.4 06/21/2015   HCT 39.4 06/21/2015   MCV 73.8* 06/21/2015   PLT 389 06/21/2015    IMAGING: CTH does not demonstrate HCP.  IMPRESSION: - 65 y.o. male SAH d# 12 s/p RVA sacrifice, remains stable. No HCP after 2d clamped. No evidence of clinical spasm - Ileus?  PLAN: - EVD d/c'ed today - Slowly advance diet - Would likely be ready for rehab over the weekend or Mon

## 2015-06-21 NOTE — Progress Notes (Signed)
[facility] ADULT ICU REPLACEMENT PROTOCOL FOR AM LAB REPLACEMENT ONLY  The patient does apply for the [facility] Adult ICU Electrolyte Replacment Protocol based on the criteria listed below:   1. Is GFR >/= 40 ml/min? Yes.    Patient's GFR today is >60 2. Is urine output >/= 0.5 ml/kg/hr for the last 6 hours? Yes.   Patient's UOP is 1.3 ml/kg/hr 3. Is BUN < 60 mg/dL? Yes.    Patient's BUN today is 23 4. Abnormal electrolyte(s): K3.6 5. Ordered repletion with: per protocol 6. If a panic level lab has been reported, has the CCM MD in charge been notified? Yes.  .   Physician:  Robbie Lis, MD  Melrose Nakayama 06/21/2015 5:47 AM

## 2015-06-21 NOTE — Care Management (Signed)
UR updated . Gerilynn Mccullars RN BSN  

## 2015-06-21 NOTE — Progress Notes (Signed)
Initial Nutrition Assessment  INTERVENTION:   Once diet advanced: Ensure Enlive po BID, each supplement provides 350 kcal and 20 grams of protein  NUTRITION DIAGNOSIS:   Inadequate oral intake related to lethargy/confusion as evidenced by meal completion < 50%.  GOAL:   Patient will meet greater than or equal to 90% of their needs  MONITOR:   PO intake, Supplement acceptance, Diet advancement, Labs  REASON FOR ASSESSMENT:   LOS   ASSESSMENT:   65 yo with severe headache from [facility] s/p coiling Rt vertebral artery.   Intake has been poor during pt's admission due to lethargy and most recently possible ileus. Pt to resume diet this am.  Starting to have BMs.  Pt remains confused and unable to answer questions.   Diet Order:  Diet NPO time specified Except for: Sips with Meds  Skin:  Reviewed, no issues  Last BM:  1/18  Height:   Ht Readings from Last 1 Encounters:  06/13/15  (1.803 m)   Weight:   Wt Readings from Last 1 Encounters:  06/13/15 197 lb 8.5 oz (89.6 kg)   Ideal Body Weight:  78.1 kg  BMI:  Body mass index is 27.56 kg/(m^2).  Estimated Nutritional Needs:   Kcal:  1800-2000  Protein:  90-110 grams  Fluid:  > 1.8 L/day  EDUCATION NEEDS:   No education needs identified at this time  Kendell Bane RD, LDN, CNSC [phone] Pager [phone] After Hours Pager

## 2015-06-21 NOTE — Progress Notes (Signed)
PULMONARY / CRITICAL CARE MEDICINE   Name: Darryl [name] MRN: [id number] DOB: [birth year]    ADMISSION DATE:  06/10/2015 CONSULTATION DATE:  06/10/15  REFERRING MD:  Conchita Paris  CHIEF COMPLAINT: Headache  SUBJECTIVE:  No headaches, IVC remains clamped CT head done  VITAL SIGNS: BP 146/95 mmHg  Pulse 70  Temp(Src) 97.5 F (36.4 C) (Oral)  Resp 20  Ht  (1.803 m)  Wt 89.6 kg (197 lb 8.5 oz)  BMI 27.56 kg/m2  SpO2 99%  INTAKE / OUTPUT: I/O last 3 completed shifts: In: 3900 [I.V.:3600; IV Piggyback:300] Out: 3350 [Urine:3350]  PHYSICAL EXAMINATION: General: alert and interactive Neuro: follows commands, PERRL, EOM-I and strength wnl, alert O x 1 only HEENT: NO ETT Cardiac: RRR, Nl S1/S2, -M Chest: CTA Abd: Soft, NT, distended and +BS. Ext: no edema and no tenderness. Skin: no rashes noted.  LABS:  BMET  Recent Labs Lab 06/19/15 0306 06/20/15 0508 06/21/15 0322  NA 133* 136 139  K 4.4 4.1 3.6  CL 106 107 110  CO2 16* 17* 17*  BUN 19 19 23*  CREATININE 0.83 0.81 0.83  GLUCOSE 193* 208* 200*    Electrolytes  Recent Labs Lab 06/19/15 0306 06/20/15 0508 06/21/15 0322  CALCIUM 8.7* 9.1 8.7*  MG 1.9 1.9 2.1  PHOS 3.0 3.2 2.7    CBC  Recent Labs Lab 06/19/15 0306 06/20/15 0743 06/21/15 0500  WBC 16.2* 20.5* 22.5*  HGB 13.8 15.1 13.3  HCT 39.6 42.0 37.2*  PLT 398 420* 385    Coag's No results for input(s): APTT, INR in the last 168 hours.  ABG No results for input(s): PHART, PCO2ART, PO2ART in the last 168 hours. Liver Enzymes No results for input(s): AST, ALT, ALKPHOS, BILITOT, ALBUMIN in the last 168 hours. Glucose  Recent Labs Lab 06/19/15 2147 06/20/15 0834 06/20/15 1159 06/20/15 1551 06/20/15 2157 06/21/15 0838  GLUCAP 151* 211* 195* 184* 187* 175*   Imaging Ct Head Wo Contrast  06/21/2015  CLINICAL DATA:  Follow-up. 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 History of hypertension, seizures,  diabetes. EXAM: CT HEAD WITHOUT CONTRAST TECHNIQUE: Contiguous axial images were obtained from the base of the skull through the vertex without intravenous contrast. COMPARISON:  CT head June 09, 2014 FINDINGS: Interval coil embolization of RIGHT V4 vertebral artery, which results in streak artifact at the skullbase. Small amount of residual subarachnoid hemorrhage. Trace posterior falcine and cerebellar tentorium subdural hematomas. Status post placement of RIGHT frontal ventriculostomy catheter with distal tip at the level of the foramen of Monro. Mild to moderate ventriculomegaly, predominately on the basis of global parenchymal brain volume loss. Trace dependent redistributed blood products in the occipital horns. Gliosis about the catheter tract. No intraparenchymal hemorrhage, mass effect, midline shift or acute large vascular territory infarcts. Basal cisterns are patent. Fluid-filled empty sella. Ocular globes and orbital contents are unremarkable. RIGHT maxillary mucosal retention cyst, small LEFT sphenoid mucosal retention cyst, LEFT anterior ethmoid mucosal thickening. Mastoid air cells are well aerated. No skull fracture. RIGHT frontal burr hole. IMPRESSION: Interval coil embolization of RIGHT V4 segment without acute large vessel occlusion nor acute intracranial process. Small amount of residual subarachnoid hemorrhage with trace falcotentorial subdural hematoma. Trace re- distributed intraventricular blood products. RIGHT frontal ventriculostomy catheter terminates at foramen of Monro. No hydrocephalus. Electronically Signed   By: Awilda Metro M.D.   On: 06/21/2015 04:50    STUDIES:  CT head 1/8 >> diffuse SAH, developing hydrocephalus CT Angio Head 1/8 >> distal  Rt V4 aneurysm 1/19 CT head>>> no hydro, improved  CULTURES: 1/8 Urine >> negative  ANTIBIOTICS: 1/8 Ancef >>   SIGNIFICANT EVENTS: 1/8 Admit for [facility], intubated 1/9 Cerebral angio with right vertebral artery  coiling  LINES/TUBES: Right ventricular drain 1/9 >> Left Radial A-line 1/9 >> 1/10  ETT 1/9 >> 1/10   DISCUSSION: 65 yo with severe headache from [facility] s/p coiling Rt vertebral artery.    He has PMHx of HTN, seizures, DM type II.  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 Hx of seizures. Headache. P:  Continue lamictal, keppra Continue nimodipine for 21 days Decadron can we wean this to off, limited benefit IVC per NS, CT reassuring no hydro Will assess last TCD - mild MCA bilateral on 16th, await read 18th Consider statin with findings (recetn headaches) and LFT in am   PULMONARY A: Compromised airway in setting of SAH. P:  O2 IS as able  CARDIOVASCULAR A:  Hx of HTN. P:  Goal SBP < 160 per neurosurgery Consider greater MAP 70 Hold outpt hyzaar. Cont nimotop/TCD monitoring.  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 P:  Replace electrolytes in am . NS at 100 ml/hr MCA TCD pos, maintain pos balance  GASTROINTESTINAL A:  Nutrition. Dysphagia Ilesu, but has had BM since P:  Start thin liquids again, then advance mech soft if okay Reglan unlikely to help colon  Protonix for SUP. Ad daily miralax if BP slow  HEMATOLOGIC A:  Anemia of critical illness, leukocytosis P:  F/u CBC intermittently. SCDs for DVT prevention but patient continues to refuse, family aware.  INFECTIOUS A:  Post-op prophylaxis. P:  Ancef per neurosurgery unlikely we need ABX as prevention NS to decide if can dc drain, CT no hydro  ENDOCRINE A:  DM type II. P:  SSI Re add diet  Patient updated bedside.  Mcarthur Rossetti. Tyson Alias, MD, FACP Pgr: [phone] [place] [facility]

## 2015-06-21 NOTE — Progress Notes (Signed)
Speech Language Pathology Treatment: Dysphagia;Cognitive-Linquistic  Patient Details Name: Darryl [name] MRN: [id number] DOB: [birth year] Today's Date: 06/21/2015 Time: 4098-1191 SLP Time Calculation (min) (ACUTE ONLY): 24 min  Assessment / Plan / Recommendation Clinical Impression  Requested by RN to re-assess swallowing at bedside. Note that patient made NPO 1/18 for potential ileus, has been consuming medication well per RN. Po trials provided and patient able to consume without overt s/s of aspiration. Oral phase continues to be prolonged with extended mastication time (likely a combination of left sided oral weakness and poor sustained attention), mild left sided buccal residue which patient clears with independent lingual sweep when provided with extra time. SLP provided moderate verbal and tactile cueing for additional use of liquids wash which successfully aids in safe oral transit of bolus. Overall, patient appears appropriate to resume a dysphagia 3 diet with thin liquid to facilitate maximum po intake given length of mastication time, when appropriate to resume pos. Will f/u for tolerance of diet.   Cognitively, goals addressed focusing on sustained attention, basic memory and problem solving skills. Patient requiring moderate-max verbal cueing initially for orientation to place and situation. SLP provided visual reminder for location in which patient able to used during spaced retrieval task (2, faded to 5 minute intervals) with moderate cueing. Max verbal/auditory and tactile cueing provided for sustained attention to basic functional ADL for 15 minute interval. Patient able to demonstrate basic problem solving skills during basic functional ADL with moderate clinician tactile cueing. Continued poor safety awareness noted with bilateral mittens in place. Patient will continue to benefit from SLP f/u for cognitive recovery.    HPI HPI: 65 yo with severe headache from [facility] s/p coiling Rt  vertebral artery.He was intubated 1/8-1/10. PMHx of HTN, seizures, DM type II.  Persisting confusion.       SLP Plan  Continue with current plan of care     Recommendations  Diet recommendations: Dysphagia 3 (mechanical soft);Thin liquid Liquids provided via: Cup;Straw Medication Administration: Whole meds with liquid Supervision: Staff to assist with self feeding Compensations: Slow rate;Small sips/bites;Lingual sweep for clearance of pocketing Postural Changes and/or Swallow Maneuvers: Seated upright 90 degrees (follow solids with liquids for oral clearance)             General recommendations: Rehab consult Oral Care Recommendations: Oral care BID Follow up Recommendations: Inpatient Rehab Plan: Continue with current plan of care     GO             [facility] MA, CCC-SLP [phone]    Kamden Reber Meryl 06/21/2015, 10:26 AM

## 2015-06-22 ENCOUNTER — Inpatient Hospital Stay (HOSPITAL_COMMUNITY): Payer: BC Managed Care – PPO

## 2015-06-22 LAB — GLUCOSE, CAPILLARY
Glucose-Capillary: 180 mg/dL — ABNORMAL HIGH (ref 65–99)
Glucose-Capillary: 124 mg/dL — ABNORMAL HIGH (ref 65–99)
Glucose-Capillary: 174 mg/dL — ABNORMAL HIGH (ref 65–99)
Glucose-Capillary: 137 mg/dL — ABNORMAL HIGH (ref 65–99)

## 2015-06-22 LAB — COMPREHENSIVE METABOLIC PANEL
ALT: 8 U/L — ABNORMAL LOW (ref 17–63)
AST: 16 U/L (ref 15–41)
Albumin: 2.1 g/dL — ABNORMAL LOW (ref 3.5–5.0)
Alkaline Phosphatase: 41 U/L (ref 38–126)
Anion gap: 7 (ref 5–15)
BUN: 14 mg/dL (ref 6–20)
CO2: 16 mmol/L — ABNORMAL LOW (ref 22–32)
Calcium: 6.8 mg/dL — ABNORMAL LOW (ref 8.9–10.3)
Chloride: 122 mmol/L — ABNORMAL HIGH (ref 101–111)
Creatinine, Ser: 0.58 mg/dL — ABNORMAL LOW (ref 0.61–1.24)
GFR calc Af Amer: >60 mL/min (ref >60)
GFR calc non Af Amer: >60 mL/min (ref >60)
Glucose, Bld: 154 mg/dL — ABNORMAL HIGH (ref 65–99)
Potassium: 2.5 mmol/L — CL (ref 3.5–5.1)
Sodium: 145 mmol/L (ref 135–145)
Total Bilirubin: 0.1 mg/dL — ABNORMAL LOW (ref 0.3–1.2)
Total Protein: 4.4 g/dL — ABNORMAL LOW (ref 6.5–8.1)

## 2015-06-22 MED ORDER — POTASSIUM CHLORIDE CRYS ER 20 MEQ PO TBCR
40.0000 meq | EXTENDED_RELEASE_TABLET | Freq: Once | ORAL | Status: AC
  Administered 2015-06-22: 40 meq via ORAL
  Filled 2015-06-22: qty 2

## 2015-06-22 MED ORDER — DEXAMETHASONE SODIUM PHOSPHATE 4 MG/ML IJ SOLN
2.0000 mg | Freq: Two times a day (BID) | INTRAMUSCULAR | Status: DC
  Administered 2015-06-22 – 2015-06-26 (×8): 2 mg via INTRAVENOUS
  Filled 2015-06-22 (×8): qty 1

## 2015-06-22 NOTE — Progress Notes (Signed)
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  pt intubated 1/8 - 1/10.  pt with hx of HTN, Seizures, and DM.   OT comments  Focus of session on grooming and self feeding at bed level.  Pt continues to demonstrate impaired attention, disorientation and tangential speech. Improved following one step commands with increased time.  Pt requiring moderate assistance for eating. Noted to have difficulty managing full liquid diet texture, ST arrived and made aware.  Follow Up Recommendations  CIR;Supervision/Assistance - 24 hour    Equipment Recommendations       Recommendations for Other Services      Precautions / Restrictions Precautions Precautions: Fall Restrictions Weight Bearing Restrictions: No       Mobility Bed Mobility               General bed mobility comments: +2 total assist to pull up in bed for eating  Transfers                      Balance                                   ADL Overall ADL's : Needs assistance/impaired Eating/Feeding: Moderate assistance;Bed level Eating/Feeding Details (indicate cue type and reason): positioned x 2 upright in bed for swallowing Grooming: Wash/dry hands;Wash/dry face;Minimal assistance;Bed level Grooming Details (indicate cue type and reason): decreased thoroughness         Upper Body Dressing : Bed level;Maximal assistance Upper Body Dressing Details (indicate cue type and reason): changed soiled gown                          Vision                 Additional Comments: pt reporting photophobia, maintaining eyes closed much of session   Perception     Praxis      Cognition   Behavior During Therapy: Impulsive Overall Cognitive Status: Impaired/Different from baseline Area of Impairment:  Orientation;Attention;Memory;Safety/judgement;Problem solving;Awareness Orientation Level: Disoriented to;Time Current Attention Level: Focused Memory: Decreased recall of precautions;Decreased short-term memory  Following Commands: Follows one step commands with increased time Safety/Judgement: Decreased awareness of safety;Decreased awareness of deficits Awareness: Intellectual Problem Solving: Slow processing;Decreased initiation;Difficulty sequencing;Requires verbal cues;Requires tactile cues General Comments: Pt with random comments. Concerned about getting his car home.    Extremity/Trunk Assessment               Exercises     Shoulder Instructions       General Comments      Pertinent Vitals/ Pain       Pain Assessment: Faces Faces Pain Scale: Hurts even more Pain Location: back Pain Descriptors / Indicators: Grimacing;Aching Pain Intervention(s): Limited activity within patient's tolerance;Monitored during session;Premedicated before session;Repositioned  Home Living                                          Prior Functioning/Environment              Frequency Min 3X/week     Progress Toward Goals  OT Goals(current goals can now be  found in the care plan section)  Progress towards OT goals: Progressing toward goals     Plan Discharge plan remains appropriate    Co-evaluation                 End of Session     Activity Tolerance Patient tolerated treatment well   Patient Left in bed;with call bell/phone within reach;with bed alarm set (posey belt on, ST in room)   Nurse Communication Mobility status        Time: 1610-9604 OT Time Calculation (min): 37 min  Charges: OT General Charges $OT Visit: 1 Procedure OT Treatments $Self Care/Home Management : 23-37 mins  Evern Bio 06/22/2015, 12:58 PM [phone]

## 2015-06-22 NOTE — Progress Notes (Signed)
Surgical staple site clean, dry and intact. Will continue to monitor.

## 2015-06-22 NOTE — Progress Notes (Signed)
Pt received at this time with no noted distress. Pt with no behavior issues at this time. Follows commands. Pt oriented to room. Safety measures in reach. Call bell within reach.

## 2015-06-22 NOTE — Progress Notes (Signed)
Speech Pathology- Left vm with neurosurgery office re: request for swallow evaluation. D/W RN - please hold POs if coughing with dinner meal and order swallow eval. Thank you, Darryl [name] L. [name], [place] CCC/SLP Pager [phone]

## 2015-06-22 NOTE — Progress Notes (Signed)
Lab called with potassium results. Md notified. Received verbal new orders.

## 2015-06-22 NOTE — Progress Notes (Signed)
Speech Language Pathology Treatment: Cognitive-Linquistic  Patient Details Name: Darryl [name] MRN: [id number] DOB: 1951-04-19 Today's Date: 06/22/2015 Time: 6213-0865 SLP Time Calculation (min) (ACUTE ONLY): 15 min  Assessment / Plan / Recommendation Clinical Impression  F/u for cognition.  Pt finishing lunch during session with OT.  Had been tolerating regular diet for several days; was made NPO after MS changes; now started back on full liquid diet.  However, pt observed to be coughing and with audible congestion after consuming liquids/jello, concerning for inadequate airway protection.  Will contact MD to request new swallowing orders.   Cognition is marked by disorientation to elements of time and situation.  Pt is confabulatory, with decreased self-regulation of speech and tangential output, confusing elements of reality with fiction.  Required min cues to orient and maintain focus.  Discussed swallowing, cognition with daughter.  Will continue to follow.    HPI HPI: 65 yo with severe headache from [facility] s/p coiling Rt vertebral artery.He was intubated 1/8-1/10. PMHx of HTN, seizures, DM type II.  Persisting confusion.       SLP Plan  Continue with current plan of care (recommend repeat swallow evaluation )     Recommendations      Needs Swallow reassessment Follow for cognition          Oral Care Recommendations: Oral care BID Plan: Continue with current plan of care (recommend repeat swallow evaluation )     GO                Blenda Mounts Laurice 06/22/2015, 12:34 PM

## 2015-06-22 NOTE — Progress Notes (Signed)
PULMONARY / CRITICAL CARE MEDICINE   Name: Jerik Falletta MRN: [id number] DOB: 02/20/1951    ADMISSION DATE:  06/10/2015 CONSULTATION DATE:  06/10/15  REFERRING MD:  Conchita Paris  CHIEF COMPLAINT: Headache  SUBJECTIVE:  Drain removed, on clears  VITAL SIGNS: BP 158/90 mmHg  Pulse 78  Temp(Src) 98.2 F (36.8 C) (Axillary)  Resp 24  Ht  (1.803 m)  Wt 89.6 kg (197 lb 8.5 oz)  BMI 27.56 kg/m2  SpO2 100%  INTAKE / OUTPUT: I/O last 3 completed shifts: In: 3800 [I.V.:3500; IV Piggyback:300] Out: 3975 [Urine:3975]  PHYSICAL EXAMINATION: General: alert and interactive, confusion about same Neuro: follows commands, PERRL,alert O x 1 HEENT: jvd wnl Cardiac: RRR, Nl S1/S2, -M Chest: CTA Abd: Soft, NT, distended slight improved, and +BS hypo Ext: no edema and no tenderness. Skin: no rashes noted.  LABS:  BMET  Recent Labs Lab 06/19/15 0306 06/20/15 0508 06/21/15 0322  NA 133* 136 139  K 4.4 4.1 3.6  CL 106 107 110  CO2 16* 17* 17*  BUN 19 19 23*  CREATININE 0.83 0.81 0.83  GLUCOSE 193* 208* 200*    Electrolytes  Recent Labs Lab 06/19/15 0306 06/20/15 0508 06/21/15 0322  CALCIUM 8.7* 9.1 8.7*  MG 1.9 1.9 2.1  PHOS 3.0 3.2 2.7    CBC  Recent Labs Lab 06/20/15 0743 06/21/15 0500 06/21/15 1152  WBC 20.5* 22.5* 21.0*  HGB 15.1 13.3 14.4  HCT 42.0 37.2* 39.4  PLT 420* 385 389    Coag's No results for input(s): APTT, INR in the last 168 hours.  ABG No results for input(s): PHART, PCO2ART, PO2ART in the last 168 hours. Liver Enzymes No results for input(s): AST, ALT, ALKPHOS, BILITOT, ALBUMIN in the last 168 hours. Glucose  Recent Labs Lab 06/20/15 1551 06/20/15 2157 06/21/15 0838 06/21/15 1119 06/21/15 1533 06/21/15 2154  GLUCAP 184* 187* 175* 185* 192* 147*   Imaging No results found.  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    He has PMHx of HTN, seizures, DM type II.  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 Hx of seizures. Headache. P:  Continue lamictal, keppra Continue nimodipine for 21 days Decadron consider dc over next 48 hrs IVC out Consider statin x 1 week  PULMONARY A: Compromised airway in setting of SAH. 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 NS at 100 ml/hr MCA, for tcd to follow, consider reduction volume  GASTROINTESTINAL A:  Nutrition. Dysphagia Colonic Ileus,contineud to have BM P:  Would hold off on diet advancement, likely in am  Protonix for SUP. Add daily miralax if BPM slows Consider enema No role reglan  HEMATOLOGIC A:  Anemia of critical illness, leukocytosis P:  SCDs  INFECTIOUS A:  Post-op  prophylaxis. P:  Ancef per neurosurgery - off, agree unlikely we need ABX as prevention  ENDOCRINE A:  DM type II. P:  SSI   Patient updated bedside. Will sign off, call if neeed  Mcarthur Rossetti. Tyson Alias, MD, FACP Pgr: [phone] [facility]

## 2015-06-22 NOTE — Progress Notes (Signed)
Pt seen and examined. No issues overnight. Has HA this am.  EXAM: Temp:  [97.5 F (36.4 C)-98.2 F (36.8 C)] 98.2 F (36.8 C) (01/20 0800) Pulse Rate:  [59-112] 78 (01/20 0700) Resp:  [14-30] 24 (01/20 0700) BP: (123-170)/(72-134) 158/90 mmHg (01/20 0700) SpO2:  [96 %-100 %] 100 % (01/20 0700) Intake/Output      01/19 0701 - 01/20 0700 01/20 0701 - 01/21 0700   I.V. (mL/kg) 2300 (25.7)    IV Piggyback 100    Total Intake(mL/kg) 2400 (26.8)    Urine (mL/kg/hr) 2875 (1.3)    Stool 0 (0)    Total Output 2875     Net -475          Stool Occurrence 2 x     Awake, alert, oriented to person, hospital, not year. slightly confused CN intact Good strength LUE/LLE mild RUE drift 4/5 RLE  LABS: Lab Results  Component Value Date   CREATININE 0.83 06/21/2015   BUN 23* 06/21/2015   NA 139 06/21/2015   K 3.6 06/21/2015   CL 110 06/21/2015   CO2 17* 06/21/2015   Lab Results  Component Value Date   WBC 21.0* 06/21/2015   HGB 14.4 06/21/2015   HCT 39.4 06/21/2015   MCV 73.8* 06/21/2015   PLT 389 06/21/2015   IMPRESSION: - 65 y.o. male SAH d# 13 s/p RVA sacrifice, remains stable. No HCP, EVD d/c'ed yesterday. No evidence of clinical spasm  PLAN: - Can transfer to floor today - Would likely be ready for rehab over the weekend or Mon

## 2015-06-22 NOTE — Consult Note (Signed)
Physical Medicine and Rehabilitation Consult   Reason for Consult: Poor safety, cognitive deficits,  Referring Physician:  Dr. Conchita Paris.    HPI: Darryl [name] is a 65 y.o. male with history fo HTN, DM type 2, seizures who was seen in ED 06/10/15 with severe HA but CT head without acute changes. Late that day he had worsening of HA followed by N/V later with unresponsiveness.  Patient intubated and sedated in ED. 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 He was evaluated by Dr. Wynetta Emery and ventriculostomy placed for management of hydrocephalus.  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  He tolerated extubation on 01/10 without difficulty and has been monitored closely for vasospasms. Headaches have improvedd but patient with balance deficits, back pain, poor safety awareness requiring restraints, fluctuating mental status with confusion and disorientation. EVD drain removed yesterday as CT head showing improvement.  He was started on liquid diet yesterday and therapy has been ongoing. He is showing improvement in mentation and participation. CIR recommended by MD and rehab team.    Review of Systems  Unable to perform ROS: mental acuity  Respiratory: Positive for cough and shortness of breath.   Cardiovascular: Positive for chest pain and palpitations.  Gastrointestinal: Positive for abdominal pain.  Musculoskeletal: Positive for myalgias, back pain and joint pain.  Neurological: Positive for headaches.  Psychiatric/Behavioral: The patient is nervous/anxious.       Past Medical History  Diagnosis Date  . Hypertension   . Seizures (HCC)   . Diabetes mellitus without complication [facility])     Past Surgical History  Procedure Laterality Date  . Knee surgery    . Cholecystectomy    . Radiology with  anesthesia N/A 06/11/2015    Procedure: RADIOLOGY WITH ANESTHESIA;  Surgeon: Lisbeth Renshaw, MD;  Location: [facility] OR;  Service: Radiology;  Laterality: N/A;    Family History  Problem Relation Age of Onset  . Alzheimer's disease Mother   . Diabetes Father   . Diabetes Paternal Grandmother   . Diabetes Paternal Grandfather      Social History:  reports that he quit smoking about 27 years ago. He does not have any smokeless tobacco history on file. He reports that he does not drink alcohol or use illicit drugs.    Allergies: No Known Allergies    Medications Prior to Admission  Medication Sig Dispense Refill  . diclofenac sodium (VOLTAREN) 1 % GEL Apply 1 application topically 2 (two) times daily as needed (pain).    Marland Kitchen etodolac (LODINE) 400 MG tablet Take 400 mg by mouth 2 (two) times daily.  3  . FeFum-FePoly-FA-B Cmp-C-Biot (INTEGRA PLUS) CAPS Take 1 capsule by mouth 2 (two) times daily.     Marland Kitchen ibuprofen (ADVIL,MOTRIN) 200 MG tablet Take 800 mg by mouth 2 (two) times daily as needed (pain).    Marland Kitchen lamoTRIgine (LAMICTAL) 100 MG tablet Take 100 mg by mouth 2 (two) times daily.     Marland Kitchen LORazepam (ATIVAN) 0.5 MG tablet Take 1 mg by mouth See admin instructions. Take 2 tablets (1 mg) by mouth daily at bedtime, may also take 1 tablet (0.5 mg) in the morning as needed for anxiety    . losartan-hydrochlorothiazide (HYZAAR) 100-25 MG per tablet Take 1 tablet by mouth daily.    . metFORMIN (GLUCOPHAGE-XR) 500 MG  24 hr tablet Take 1,500 mg by mouth at bedtime.  2  . naproxen (NAPROSYN) 500 MG tablet Take 500 mg by mouth 2 (two) times daily as needed for mild pain.   2  . potassium chloride SA (K-DUR,KLOR-CON) 20 MEQ tablet Take 20 mEq by mouth at bedtime.     . Vitamin D, Ergocalciferol, (DRISDOL) 50000 UNITS CAPS capsule Take 50,000 Units by mouth once a week. On Sundays  5    Home: Home Living Family/patient expects to be discharged to:: Inpatient rehab Living Arrangements: Spouse/significant  other Available Help at Discharge: Family, Available 24 hours/day Type of Home: House Home Access: Stairs to enter [employer] of Steps: 6 Entrance Stairs-Rails: Right, Left Home Layout: Two level, 1/2 bath on main level Alternate Level Stairs-Number of Steps: flight Alternate Level Stairs-Rails: Left Bathroom Shower/Tub: Health visitor: Handicapped height Bathroom Accessibility: Yes Home Equipment: Environmental consultant - 2 wheels, Cane - single point, Crutches  Lives With: Spouse  Functional History: Prior Function Level of Independence: Independent 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 Functional Status:  Mobility: Bed Mobility Overal bed mobility: Needs Assistance, +2 for physical assistance Bed Mobility: Supine to Sit, Sit to Supine Supine to sit: Max assist, +2 for physical assistance Sit to supine: Max assist, +2 for physical assistance General bed mobility comments: pt needs cues and facilitation for technique, but better participation today.   Transfers Overall transfer level: Needs assistance Equipment used: 2 person hand held assist Transfers: Sit to/from Stand Sit to Stand: Max assist, +2 physical assistance Stand pivot transfers: Mod assist, +2 physical assistance General transfer comment: pt agreeable to attempt standing today and did participate with coming to standing.  pt able to stand for ~39mins prior to needing to sit.  Bil LEs blocked.        ADL: ADL Overall ADL's : Needs assistance/impaired Eating/Feeding: Minimal assistance, Sitting Grooming: Wash/dry hands, Wash/dry face, Minimal assistance, Sitting Upper Body Bathing: Maximal assistance, Sitting Lower Body Bathing: Total assistance, Sit to/from stand Upper Body Dressing : Maximal assistance, Sitting Lower Body Dressing: Total assistance, Sit to/from stand Toilet Transfer: +2 for physical assistance, Stand-pivot, BSC, Moderate assistance Toileting- Clothing Manipulation and Hygiene:  Total assistance, Sit to/from stand, +2 for physical assistance  Cognition: Cognition Overall Cognitive Status: Impaired/Different from baseline Arousal/Alertness: Awake/alert Orientation Level: Oriented to person, Disoriented to time, Disoriented to situation, Disoriented to place Attention: Focused Focused Attention: Appears intact Memory: Impaired Memory Impairment: Storage deficit, Retrieval deficit Awareness: Impaired Awareness Impairment: Intellectual impairment Problem Solving: Impaired Problem Solving Impairment: Verbal basic Behaviors: Confabulation, Restless, Impulsive Safety/Judgment: Impaired Cognition Arousal/Alertness: Awake/alert Behavior During Therapy: Impulsive Overall Cognitive Status: Impaired/Different from baseline Area of Impairment: Orientation, Attention, Memory, Following commands, Safety/judgement, Awareness, Problem solving Orientation Level: Disoriented to, Place, Time, Situation Current Attention Level: Focused Memory: Decreased recall of precautions, Decreased short-term memory Following Commands: Follows one step commands inconsistently, Follows one step commands with increased time Safety/Judgement: Decreased awareness of safety, Decreased awareness of deficits Awareness: Intellectual Problem Solving: Slow processing, Decreased initiation, Difficulty sequencing, Requires verbal cues, Requires tactile cues General Comments: pt does have some improvement in ability to follow directions today.  When pt was told he was in a hospital in [place], he was able to come up with the name [facility], but pror to PT providing cues he thought we were in "the [facility]".    Blood pressure 158/90, pulse 78, temperature 98.2 F (36.8 C), temperature source Axillary, resp. rate 24, height  (1.803 m),  weight 89.6 kg (197 lb 8.5 oz), SpO2 100 %. Physical Exam  Nursing note and vitals reviewed. Constitutional: He appears well-developed and well-nourished. He  appears lethargic. He is easily aroused. No distress.  Wincing and moaning thorough out the exam.   HENT:  Head: Normocephalic and atraumatic.  Few Staples and sutures noted on right scalp.   Eyes: Conjunctivae are normal. Pupils are equal, round, and reactive to light.  Neck:  Limited   Cardiovascular: Normal rate and regular rhythm.   Respiratory: Effort normal. No tachypnea. He has decreased breath sounds in the right lower field and the left lower field.  GI: He exhibits distension. Bowel sounds are decreased. There is tenderness.  Tender to palpation.   Musculoskeletal:  Back and knee pain with ROM BLE.   Neurological: He is easily aroused. He appears lethargic.  Distracted by pain and hard to redirect. complains of  HA and light sensitivity.  Oriented to self and place ([facility]) only and was unable to state month, age or DOB. Told me where he lives. Perseverative speech. He was able to follow two step motor commands with cues. Thought processing tangential and disassociated at times. LUE with pronator drift, tremors with persistent use/intentional movements. Strength 4/5 RUE 4-/5 LUE. RLE 3/5 prox to 4/5 distally. LLE: 3- prox to 3/5 distally. Senses pain in all 4's.   Skin: Skin is warm and dry. He is not diaphoretic.  Psychiatric: He is withdrawn. Cognition and memory are impaired. He expresses impulsivity and inappropriate judgment. He is inattentive.    Results for orders placed or performed during the hospital encounter of 06/10/15 (from the past 24 hour(s))  Glucose, capillary     Status: Abnormal   Collection Time: 06/21/15  8:38 AM  Result Value Ref Range   Glucose-Capillary 175 (H) 65 - 99 mg/dL  Glucose, capillary     Status: Abnormal   Collection Time: 06/21/15 11:19 AM  Result Value Ref Range   Glucose-Capillary 185 (H) 65 - 99 mg/dL   Comment 1 Notify RN    Comment 2 Document in Chart   Urinalysis, Routine w reflex microscopic (not at [facility])     Status: Abnormal    Collection Time: 06/21/15 11:38 AM  Result Value Ref Range   Color, Urine YELLOW YELLOW   APPearance CLEAR CLEAR   Specific Gravity, Urine 1.020 1.005 - 1.030   pH 6.0 5.0 - 8.0   Glucose, UA NEGATIVE NEGATIVE mg/dL   Hgb urine dipstick LARGE (A) NEGATIVE   Bilirubin Urine NEGATIVE NEGATIVE   Ketones, ur NEGATIVE NEGATIVE mg/dL   Protein, ur 161 (A) NEGATIVE mg/dL   Nitrite POSITIVE (A) NEGATIVE   Leukocytes, UA NEGATIVE NEGATIVE  Urine microscopic-add on     Status: Abnormal   Collection Time: 06/21/15 11:38 AM  Result Value Ref Range   Squamous Epithelial / LPF 0-5 (A) NONE SEEN   WBC, UA 0-5 0 - 5 WBC/hpf   RBC / HPF 6-30 0 - 5 RBC/hpf   Bacteria, UA FEW (A) NONE SEEN   Casts GRANULAR CAST (A) NEGATIVE  CBC with Differential/Platelet     Status: Abnormal   Collection Time: 06/21/15 11:52 AM  Result Value Ref Range   WBC 21.0 (H) 4.0 - 10.5 K/uL   RBC 5.34 4.22 - 5.81 MIL/uL   Hemoglobin 14.4 13.0 - 17.0 g/dL   HCT 09.6 04.5 - 40.9 %   MCV 73.8 (L) 78.0 - 100.0 fL   MCH 27.0 26.0 - 34.0 pg  MCHC 36.5 (H) 30.0 - 36.0 g/dL   RDW 16.1 09.6 - 04.5 %   Platelets 389 150 - 400 K/uL   Neutrophils Relative % 91 %   Lymphocytes Relative 7 %   Monocytes Relative 2 %   Eosinophils Relative 0 %   Basophils Relative 0 %   Neutro Abs 19.1 (H) 1.7 - 7.7 K/uL   Lymphs Abs 1.5 0.7 - 4.0 K/uL   Monocytes Absolute 0.4 0.1 - 1.0 K/uL   Eosinophils Absolute 0.0 0.0 - 0.7 K/uL   Basophils Absolute 0.0 0.0 - 0.1 K/uL   WBC Morphology TOXIC GRANULATION    Smear Review LARGE PLATELETS PRESENT   Glucose, capillary     Status: Abnormal   Collection Time: 06/21/15  3:33 PM  Result Value Ref Range   Glucose-Capillary 192 (H) 65 - 99 mg/dL   Comment 1 Notify RN    Comment 2 Document in Chart   Glucose, capillary     Status: Abnormal   Collection Time: 06/21/15  9:54 PM  Result Value Ref Range   Glucose-Capillary 147 (H) 65 - 99 mg/dL   Ct Head Wo Contrast  06/21/2015  CLINICAL DATA:   Follow-up. 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 History of hypertension, seizures, diabetes. EXAM: CT HEAD WITHOUT CONTRAST TECHNIQUE: Contiguous axial images were obtained from the base of the skull through the vertex without intravenous contrast. COMPARISON:  CT head June 09, 2014 FINDINGS: Interval coil embolization of RIGHT V4 vertebral artery, which results in streak artifact at the skullbase. Small amount of residual subarachnoid hemorrhage. Trace posterior falcine and cerebellar tentorium subdural hematomas. Status post placement of RIGHT frontal ventriculostomy catheter with distal tip at the level of the foramen of Monro. Mild to moderate ventriculomegaly, predominately on the basis of global parenchymal brain volume loss. Trace dependent redistributed blood products in the occipital horns. Gliosis about the catheter tract. No intraparenchymal hemorrhage, mass effect, midline shift or acute large vascular territory infarcts. Basal cisterns are patent. Fluid-filled empty sella. Ocular globes and orbital contents are unremarkable. RIGHT maxillary mucosal retention cyst, small LEFT sphenoid mucosal retention cyst, LEFT anterior ethmoid mucosal thickening. Mastoid air cells are well aerated. No skull fracture. RIGHT frontal burr hole. IMPRESSION: Interval coil embolization of RIGHT V4 segment without acute large vessel occlusion nor acute intracranial process. Small amount of residual subarachnoid hemorrhage with trace falcotentorial subdural hematoma. Trace re- distributed intraventricular blood products. RIGHT frontal ventriculostomy catheter terminates at foramen of Monro. No hydrocephalus. Electronically Signed   By: Awilda Metro M.D.   On: 06/21/2015 04:50    Assessment/Plan: Diagnosis: subarachnoid hemorrhage with hydrocephalus resulting in cognitive and mobility deficits 1. Does the need for close, 24 hr/day medical supervision in concert with the patient's rehab  needs make it unreasonable for this patient to be served in a less intensive setting? Yes 2. Co-Morbidities requiring supervision/potential complications: DM, HTN 3. Due to bladder management, bowel management, safety, skin/wound care, disease management, medication administration, pain management and patient education, does the patient require 24 hr/day rehab nursing? Yes 4. Does the patient require coordinated care of a physician, rehab nurse, PT (1-2 hrs/day, 5 days/week), OT (1-2 hrs/day, 5 days/week) and SLP (1-2 hrs/day, 5 days/week) to address physical and functional deficits in the context of the above medical diagnosis(es)? Yes Addressing deficits in the following areas: balance, endurance, locomotion, strength, transferring, bowel/bladder control, bathing, dressing, feeding, grooming, toileting, cognition, speech, language and psychosocial support 5. Can the patient actively participate in an  intensive therapy program of at least 3 hrs of therapy per day at least 5 days per week? Yes 6. The potential for patient to make measurable gains while on inpatient rehab is excellent 7. Anticipated functional outcomes upon discharge from inpatient rehab are modified independent and supervision  with PT, modified independent and supervision with OT, supervision with SLP. 8. Estimated rehab length of stay to reach the above functional goals is: 12-18 days 9. Does the patient have adequate social supports and living environment to accommodate these discharge functional goals? Yes 10. Anticipated D/C setting: Home 11. Anticipated post D/C treatments: HH therapy and Outpatient therapy 12. Overall Rehab/Functional Prognosis: excellent  RECOMMENDATIONS: This patient's condition is appropriate for continued rehabilitative care in the following setting: CIR Patient has agreed to participate in recommended program. Yes Note that insurance prior authorization may be required for reimbursement for recommended  care.  Comment: Seems to have supportive family. Rehab Admissions Coordinator to follow up.  Thanks,  Ranelle Oyster, MD, [place]     06/22/2015

## 2015-06-22 NOTE — Progress Notes (Signed)
Inpatient Rehabilitation  I met with the patient and his wife at the bedside to discuss the recommendation of IP Rehab.  Pt. Was restless and not able to meaningfully participate in the conversation. I provided information to wife about the CIR program along with informational booklets.  She requests that I pursue insurance authorization for a rehab admission.  I have initiated and will follow up when a decision is available.  Please call if questions.  [place] Admissions Coordinator Cell [phone] Office [phone]

## 2015-06-23 LAB — COMPREHENSIVE METABOLIC PANEL
ALT: 11 U/L — ABNORMAL LOW (ref 17–63)
AST: 26 U/L (ref 15–41)
Albumin: 2.8 g/dL — ABNORMAL LOW (ref 3.5–5.0)
Alkaline Phosphatase: 49 U/L (ref 38–126)
Anion gap: 10 (ref 5–15)
BUN: 14 mg/dL (ref 6–20)
CO2: 20 mmol/L — ABNORMAL LOW (ref 22–32)
Calcium: 8.6 mg/dL — ABNORMAL LOW (ref 8.9–10.3)
Chloride: 110 mmol/L (ref 101–111)
Creatinine, Ser: 0.84 mg/dL (ref 0.61–1.24)
GFR calc Af Amer: >60 mL/min (ref >60)
GFR calc non Af Amer: >60 mL/min (ref >60)
Glucose, Bld: 189 mg/dL — ABNORMAL HIGH (ref 65–99)
Potassium: 3.8 mmol/L (ref 3.5–5.1)
Sodium: 140 mmol/L (ref 135–145)
Total Bilirubin: 0.5 mg/dL (ref 0.3–1.2)
Total Protein: 5.4 g/dL — ABNORMAL LOW (ref 6.5–8.1)

## 2015-06-23 LAB — GLUCOSE, CAPILLARY
Glucose-Capillary: 183 mg/dL — ABNORMAL HIGH (ref 65–99)
Glucose-Capillary: 155 mg/dL — ABNORMAL HIGH (ref 65–99)
Glucose-Capillary: 203 mg/dL — ABNORMAL HIGH (ref 65–99)
Glucose-Capillary: 95 mg/dL (ref 65–99)

## 2015-06-23 MED ORDER — MORPHINE SULFATE (PF) 2 MG/ML IV SOLN
1.0000 mg | INTRAVENOUS | Status: DC | PRN
  Administered 2015-06-23 – 2015-06-24 (×5): 2 mg via INTRAVENOUS
  Administered 2015-06-24: 1 mg via INTRAVENOUS
  Administered 2015-06-25 (×2): 2 mg via INTRAVENOUS
  Filled 2015-06-23 (×8): qty 1

## 2015-06-23 MED ORDER — POTASSIUM CHLORIDE CRYS ER 20 MEQ PO TBCR
20.0000 meq | EXTENDED_RELEASE_TABLET | Freq: Once | ORAL | Status: AC
  Administered 2015-06-23: 20 meq via ORAL
  Filled 2015-06-23: qty 1

## 2015-06-23 MED ORDER — LEVETIRACETAM 100 MG/ML PO SOLN
500.0000 mg | Freq: Two times a day (BID) | ORAL | Status: DC
  Filled 2015-06-23 (×3): qty 5

## 2015-06-23 NOTE — Progress Notes (Signed)
Patient made NPO per day nurse because of difficulty with swallowing. (coughing after sips). Patient is having pain at an 8. Notified Jones MD. Orders received, will continue to monitor.

## 2015-06-23 NOTE — Progress Notes (Signed)
06/23/15 1000  What Happened  Was fall witnessed? No  Was patient injured? No  Patient found on floor  Found by Staff-comment Leavy Cella)  Stated prior activity to/from bed, chair, or stretcher  Follow Up  MD notified Dr. Marikay Alar, neurosurgery  Time MD notified 64  Family notified Yes-comment (daughter and wife)  Time family notified 1537  Additional tests No  Progress note created (see row info) Yes  Adult Fall Risk Assessment  Risk Factor Category (scoring not indicated) Not Applicable  Age 65  Fall History: Fall within 6 months prior to admission 0  Elimination; Bowel and/or Urine Incontinence 2  Elimination; Bowel and/or Urine Urgency/Frequency 0  Medications: includes PCA/Opiates, Anti-convulsants, Anti-hypertensives, Diuretics, Hypnotics, Laxatives, Sedatives, and Psychotropics 3  Patient Care Equipment 1  Mobility-Assistance 2  Mobility-Gait 2  Mobility-Sensory Deficit 2  Cognition-Awareness 1  Cognition-Impulsiveness 2  Cognition-Limitations 4  Total Score 20  Patient's Fall Risk High Fall Risk (>13 points)  Adult Fall Risk Interventions  Required Bundle Interventions *See Row Information* High fall risk - low, moderate, and high requirements implemented  Additional Interventions Fall risk signage;Appropriate bed frame use with air overlay;Reorient/diversional activities with confused patients (requested sitter)  Fall with Injury Screening  Risk For Fall Injury- See Row Information  None identified  Vitals  Temp 98.3 F (36.8 C)  Temp Source Oral  BP (!) 133/97 mmHg  MAP (mmHg) 105  BP Location Left Arm  BP Method Automatic  Patient Position (if appropriate) Lying  Pulse Rate 73  Pulse Rate Source Dinamap  Resp 18  Oxygen Therapy  SpO2 100 %  O2 Device Room Air  Pain Assessment  Pain Assessment No/denies pain  PAINAD (Pain Assessment in Advanced Dementia)  Breathing 0  Negative Vocalization 0  Facial Expression 0  Body Language 0  Consolability  0  PAINAD Score 0  PCA/Epidural/Spinal Assessment  Respiratory Pattern Regular;Unlabored  Neurological  Neuro (WDL) X  Level of Consciousness Alert  Orientation Level Oriented to person  Cognition Follows commands;Poor judgement;Poor safety awareness;Poor attention/concentration  Speech Clear  Pupil Assessment  Yes  R Pupil Size (mm) 3  R Pupil Shape Round  R Pupil Reaction Brisk  L Pupil Size (mm) 3  L Pupil Shape Round  L Pupil Reaction Brisk  Additional Pupil Assessments No  Motor Function/Sensation Assessment Grip;Plantar flexion  Facial Symmetry Symmetrical  R Hand Grip Moderate  L Hand Grip Moderate   Right Pronator Drift Absent  Left Pronator Drift Absent  R Foot Dorsiflexion Moderate  L Foot Dorsiflexion Moderate  R Foot Plantar Flexion Moderate  L Foot Plantar Flexion Moderate  RUE Motor Response Purposeful movement  RUE Sensation Full sensation;No numbness;No pain;No tingling  RUE Motor Strength 4  LUE Motor Response Purposeful movement  LUE Sensation Full sensation;No numbness;No pain;No tingling  LUE Motor Strength 5  RLE Motor Response Purposeful movement  RLE Sensation Full sensation;No numbness;No pain;No tingling  RLE Motor Strength 4  LLE Motor Response Purposeful movement  LLE Sensation Full sensation;No numbness;No pain;No tingling  LLE Motor Strength 4  Neuro Symptoms Agitation  Neuro Additional Assessments Yes  Glasgow Coma Scale  Eye Opening 4  Best Verbal Response (NON-intubated) 4  Best Motor Response 6  Glasgow Coma Scale Score 14  NIH Stroke Scale (Modified Stroke Scale Criteria)   Interval Shift assessment  Level of Consciousness (1a. )  0  LOC Questions (1b. )  1  LOC Commands (1c. )  0  Best Gaze (2. ) 0  Visual (3. ) 0  Facial Palsy (4. )  0  Motor Arm, Left (5a. )  0  Motor Arm, Right (5b. )  0  Motor Leg, Left (6a. ) 0  Motor Leg, Right (6b. )  0  Limb Ataxia (7. ) 0  Sensory (8. ) 0  Best Language (9. ) 1  Dysarthria (10.  ) 0  Inattention/Extinction 0  Total 2  Musculoskeletal  Musculoskeletal (WDL) X  Assistive Device MaxiMove  Generalized Weakness Yes  Weight Bearing Restrictions No  Musculoskeletal Details  RUE Weakness  RLE Weakness  Integumentary  Integumentary (WDL) X  Skin Color Appropriate for ethnicity  Skin Condition Dry  Skin Integrity Surgical Incision (see LDA)  Skin Turgor Non-tenting   Was the fall witnessed: No  Patient condition before and after the fall: Unchanged neuro assessment, unchanged NIHHS, no new skin issues.  Patient's reaction to the fall: Stated he was trying to get out of bed, continued to attempt to get out of bed.  Name of the doctor that was notified including date and time: Dr. Marikay Alar  Any interventions and vital signs: VSS, see flowsheet. MD notified.

## 2015-06-23 NOTE — Progress Notes (Signed)
Filed Vitals:   06/22/15 1652 06/22/15 2102 06/23/15 0120 06/23/15 0502  BP: 165/99 152/89 139/81 148/95  Pulse: 78 67 73 84  Temp: 97.5 F (36.4 C) 98.4 F (36.9 C) 99.1 F (37.3 C) 99.5 F (37.5 C)  TempSrc: Oral Oral Oral Oral  Resp: Height:      Weight:      SpO2: 100% 100% 100% 100%    CBC  Recent Labs  06/21/15 0500 06/21/15 1152  WBC 22.5* 21.0*  HGB 13.3 14.4  HCT 37.2* 39.4  PLT 385 389   BMET  Recent Labs  06/22/15 1345 06/23/15 0243  NA 145 140  K 2.5* 3.8  CL 122* 110  CO2 16* 20*  GLUCOSE 154* 189*  BUN 14 14  CREATININE 0.58* 0.84  CALCIUM 6.8* 8.6*    Patient had restless night last night according to nurses, now fatigued and resting in bed. Found to be hypokalemic yesterday, given additional by mouth K+, and potassium level improved earlier this morning. We'll give 20 mg once by mouth now, and recheck BMET in a.m. Continuing PT and OT. Awaiting CIR.  Plan: Continue current care.  Hewitt Shorts, MD 06/23/2015, 9:31 AM

## 2015-06-24 ENCOUNTER — Inpatient Hospital Stay (HOSPITAL_COMMUNITY): Payer: BC Managed Care – PPO

## 2015-06-24 LAB — BASIC METABOLIC PANEL
Anion gap: 10 (ref 5–15)
BUN: 13 mg/dL (ref 6–20)
CO2: 21 mmol/L — ABNORMAL LOW (ref 22–32)
Calcium: 8.6 mg/dL — ABNORMAL LOW (ref 8.9–10.3)
Chloride: 109 mmol/L (ref 101–111)
Creatinine, Ser: 0.73 mg/dL (ref 0.61–1.24)
GFR calc Af Amer: >60 mL/min (ref >60)
GFR calc non Af Amer: >60 mL/min (ref >60)
Glucose, Bld: 149 mg/dL — ABNORMAL HIGH (ref 65–99)
Potassium: 3.4 mmol/L — ABNORMAL LOW (ref 3.5–5.1)
Sodium: 140 mmol/L (ref 135–145)

## 2015-06-24 LAB — GLUCOSE, CAPILLARY
Glucose-Capillary: 120 mg/dL — ABNORMAL HIGH (ref 65–99)
Glucose-Capillary: 135 mg/dL — ABNORMAL HIGH (ref 65–99)
Glucose-Capillary: 136 mg/dL — ABNORMAL HIGH (ref 65–99)
Glucose-Capillary: 151 mg/dL — ABNORMAL HIGH (ref 65–99)
Glucose-Capillary: 152 mg/dL — ABNORMAL HIGH (ref 65–99)

## 2015-06-24 MED ORDER — SODIUM CHLORIDE 0.9 % IV SOLN
500.0000 mg | Freq: Two times a day (BID) | INTRAVENOUS | Status: DC
  Administered 2015-06-24 – 2015-06-27 (×7): 500 mg via INTRAVENOUS
  Filled 2015-06-24 (×8): qty 5

## 2015-06-24 NOTE — Progress Notes (Addendum)
Noted pt with both knees on floor and both hand on bed when walked in to answer call light/bed alarm. Denies hitting head. Pt states rolled out of bed trying to turn off bed alarm. No bruises or no new skin tear noted. Assisted to chair then to bed with 2 Assist. Rated pain 10/10 on both knees. Morphine administered. VSS. Wife Talbert Forest notified). MD made aware.

## 2015-06-24 NOTE — Progress Notes (Signed)
Patient ID: Darryl [name], male   DOB: 1950-10-11, 65 y.o.   MRN: [id number] Denies headache today, neurologic exam unchanged, awake and pleasant and conversant and moving all extremities

## 2015-06-24 NOTE — Evaluation (Signed)
Clinical/Bedside Swallow Evaluation Patient Details  Name: Darryl [name] MRN: [id number] Date of Birth: 1951/03/23  Today's Date: 06/24/2015 Time: SLP Start Time (ACUTE ONLY): 1050 SLP Stop Time (ACUTE ONLY): 1110 SLP Time Calculation (min) (ACUTE ONLY): 20 min  Past Medical History:  Past Medical History  Diagnosis Date  . Hypertension   . Seizures (HCC)   . Diabetes mellitus without complication [facility])    Past Surgical History:  Past Surgical History  Procedure Laterality Date  . Knee surgery    . Cholecystectomy    . Radiology with anesthesia N/A 06/11/2015    Procedure: RADIOLOGY WITH ANESTHESIA;  Surgeon: Lisbeth Renshaw, MD;  Location: [facility] OR;  Service: Radiology;  Laterality: N/A;   HPI:  Pt is a 65 y.o. male PMH hypertension, seizures, and diabetes, admitted 1/8 with subarachnoid hemorrhage. Pt was intubated 1/8-1/10. Was on a regular diet and tolerating well, then made NPO after MS changes, then started back on full liquid diet. Was then made NPO agian d/t concerns with coughing following thin liquids on 1/20 and 1/21. Pt with persisting confusion. Bedside swallow eval re-ordered.    Assessment / Plan / Recommendation Clinical Impression  Bedside swallow evaluation was limited this a.m. as pt was experiencing severe head pain. When [facility] was elevated head pain increased. Able to have HOB elevated briefly in order to attempt PO trials, then needed to partially recline to give pt some pain relief. RN informed and provided pain meds. Pt did not show immediate s/s of aspiration following thin liquid and puree trials; however, pt had a delayed, weak, congested cough following all trials and also demonstrated this cough prior to providing any PO trials. Pt also demonstrating cognitive deficits in safety awareness and reasoning skills- repeatedly asked if he could get out of bed, kept asking for bed to be re-positioned different ways because he "couldn't see the water". Pt to have CT scan  today. Given current cognitive status, weak cough, and inability to remain upright for POs, recommend that pt remain NPO with meds via alternative means, provide frequent oral care. Pt would benefit from a MBS to further assess swallow function; however, given level of head pain when HOB elevated this does not seem like a possibility today. Will continue to follow with further recommendations pending objective evaluation- will check tomorrow for readiness.     Aspiration Risk  Severe aspiration risk    Diet Recommendation NPO   Medication Administration: Via alternative means    Other  Recommendations Oral Care Recommendations: Oral care QID   Follow up Recommendations  Inpatient Rehab    Frequency and Duration min 2x/week  2 weeks       Prognosis Prognosis for Safe Diet Advancement: Good Barriers to Reach Goals: Cognitive deficits      Swallow Study   General HPI: Pt is a 65 y.o. male PMH hypertension, seizures, and diabetes, admitted 1/8 with subarachnoid hemorrhage. Pt was intubated 1/8-1/10. Was on a regular diet and tolerating well, then made NPO after MS changes, then started back on full liquid diet. Was then made NPO agian d/t concerns with coughing following thin liquids on 1/20 and 1/21. Pt with persisting confusion. Bedside swallow eval re-ordered.  Type of Study: Bedside Swallow Evaluation Previous Swallow Assessment: had BSE post-intubation; was on reg diet for a few days then made NPO Diet Prior to this Study: NPO Temperature Spikes Noted: Yes (low grade 1/21) Respiratory Status: Room air History of Recent Intubation: Yes Length of Intubations (days): 2 days  Date extubated: 06/12/15 Behavior/Cognition: Cooperative;Distractible;Requires cueing;Confused Oral Cavity Assessment: Within Functional Limits Oral Cavity - Dentition: Adequate natural dentition Vision:  (stated "I can't see the water") Self-Feeding Abilities: Able to feed self Patient Positioning: Upright in  bed Baseline Vocal Quality: Breathy Volitional Cough: Weak Volitional Swallow: Able to elicit    Oral/Motor/Sensory Function Overall Oral Motor/Sensory Function: Within functional limits   Ice Chips Ice chips: Not tested   Thin Liquid Thin Liquid: Impaired Presentation: Cup;Straw Pharyngeal  Phase Impairments: Cough - Delayed    Nectar Thick Nectar Thick Liquid: Not tested   Honey Thick Honey Thick Liquid: Not tested   Puree Puree: Impaired Presentation: Spoon Pharyngeal Phase Impairments: Cough - Delayed   Solid   GO   Solid: Not tested        Rebecca Eaton, Amy K, MA, CCC-SLP 06/24/2015,11:20 AM

## 2015-06-25 ENCOUNTER — Inpatient Hospital Stay (HOSPITAL_COMMUNITY): Payer: BC Managed Care – PPO

## 2015-06-25 LAB — GLUCOSE, CAPILLARY
Glucose-Capillary: 139 mg/dL — ABNORMAL HIGH (ref 65–99)
Glucose-Capillary: 186 mg/dL — ABNORMAL HIGH (ref 65–99)
Glucose-Capillary: 139 mg/dL — ABNORMAL HIGH (ref 65–99)
Glucose-Capillary: 172 mg/dL — ABNORMAL HIGH (ref 65–99)

## 2015-06-25 MED ORDER — MAGNESIUM CITRATE PO SOLN
1.0000 | Freq: Once | ORAL | Status: AC
  Administered 2015-06-25: 1 via ORAL
  Filled 2015-06-25: qty 296

## 2015-06-25 MED ORDER — RESOURCE THICKENUP CLEAR PO POWD
ORAL | Status: DC | PRN
  Filled 2015-06-25: qty 125

## 2015-06-25 MED ORDER — NALOXEGOL OXALATE 25 MG PO TABS
25.0000 mg | ORAL_TABLET | Freq: Every day | ORAL | Status: DC
Start: 2015-06-25 — End: 2015-06-27
  Administered 2015-06-25 – 2015-06-26 (×2): 25 mg via ORAL
  Filled 2015-06-25 (×3): qty 1

## 2015-06-25 NOTE — Progress Notes (Signed)
Patient is complaining of light headedness and some dizziness HR irregular in low 60's will continue to monitor.

## 2015-06-25 NOTE — Progress Notes (Signed)
Inpatient Rehabilitation  I have received insurance approval for CIR admission however pt. is not medically ready today.  Upon exam from rehab PA, there is question of possible ileus.  I have discussed with pt's RN Jeannett Senior.  He is aware and reports pt. has been seen by Dr. Conchita Paris this am.  I will await medical readiness and bed availability for possible CIR admission.  Fabio Neighbors, RNCM has been updated.  Please call if questions.  Weldon Picking PT Inpatient Rehab Admissions Coordinator Cell [phone] Office [phone]

## 2015-06-25 NOTE — Progress Notes (Signed)
No issues overnight. Pt reports minimal abd pain this am, did have BM yesterday. Has mild HA this am. Noted per RN to have deeper respiratory secretions.  EXAM:  BP 127/97 mmHg  Pulse 84  Temp(Src) 97.5 F (36.4 C) (Axillary)  Resp 22  Ht  (1.803 m)  Wt 89.6 kg (197 lb 8.5 oz)  BMI 27.56 kg/m2  SpO2 100%  Awake, alert, oriented  Speech fluent, appropriate  CN grossly intact  Mild right hemiparesis stable Good left strength  IMPRESSION:  65 y.o. male SAH d# 16, stable mild right hemiparesis, impulsivity. - ? Mild ileus - Some deeper respiratory secretions but no fever, doesn't appear to have pneumonia.  PLAN: - Will add Mag citrate and naloxegol - Deep NT suctioning today.

## 2015-06-25 NOTE — Evaluation (Signed)
Clinical/Bedside Swallow Evaluation Patient Details  Name: Darryl [name] MRN: [id number] Date of Birth: 09/30/1950  Today's Date: 06/25/2015 Time: SLP Start Time (ACUTE ONLY): 0800 SLP Stop Time (ACUTE ONLY): 0815 SLP Time Calculation (min) (ACUTE ONLY): 15 min   MBSS was completed.  Please see imaging for full results.  Recommended dysphagia 1 with nectar thick liquids.  Patient is at risk for aspiration due to poor positioning secondary to pain and pharyngeal residue.  ST will follow closely.    Dimas Aguas, MA, CCC-SLP Acute Rehab SLP [phone] Dimas Aguas N 06/25/2015,8:37 AM

## 2015-06-25 NOTE — Progress Notes (Signed)
NTS performed on pt via Left nare.  Lubrication was used and pt tolerated procedure well.  A moderate amt of very thick yellow secretions were returned.  Site left intact without bleeding being noted.

## 2015-06-25 NOTE — Progress Notes (Signed)
PT Cancellation Note  Patient Details Name: Darryl [name] MRN: [id number] DOB: [birth year]   Cancelled Treatment:    Reason Eval/Treat Not Completed: Medical issues which prohibited therapy. Nursing requesting no PT today, will continue to follow.    Christiane Ha, PT, CSCS Pager [phone] Office [phone]  06/25/2015, 2:30 PM

## 2015-06-25 NOTE — Progress Notes (Signed)
Patient having abdominal pain bowel sounds are faint at best he is distended and bloated obtained order for mag citrate patient had bowel movement, additionally patient has no safety awareness, sitter is not being replaced so order for vail bed was given and patient is being placed in it, will continue to monitor.

## 2015-06-25 NOTE — H&P (Addendum)
  Physical Medicine and Rehabilitation Admission H&P    Chief Complaint  Patient presents with  . Lethargy, cognitive deficits, balance deficits.     HPI: Darryl [name] is a 64 y.o. male with history fo HTN, DM type 2, PVCs, seizures who was seen in ED 06/10/15 with severe HA but CT head without acute changes. Late that day he had worsening of HA followed by N/V later with unresponsiveness. Patient intubated and sedated in ED. 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 He was evaluated by Dr. Cram and ventriculostomy placed for management of hydrocephalus. 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 He tolerated extubation on 01/10 without difficulty and has been monitored closely for vasospasms. Headaches have improved but patient with balance deficits, back pain, poor safety awareness requiring restraints, fluctuating mental status with confusion and disorientation. EVD drain removed 01/19 as CT head showing improvement.  He is showing improvement in mentation and participation but noted to have wet voice with abdominal distension and pain. CIR recommended by MD and rehab team.    Review of Systems  Constitutional: Negative for diaphoresis.  HENT: Negative for hearing loss.   Eyes: Negative for blurred vision and double vision.  Respiratory: Negative for cough, sputum production and shortness of breath.   Cardiovascular: Negative for chest pain and palpitations.  Gastrointestinal: Negative for heartburn, nausea and abdominal pain.  Genitourinary: Negative for dysuria and urgency.  Musculoskeletal: Positive for myalgias, back pain (since hospitalization) and joint pain (chronic right knee pain).  Neurological: Positive for dizziness, speech change, weakness and headaches ("constant").  Psychiatric/Behavioral:  Positive for memory loss. Negative for substance abuse. The patient does not have insomnia.   All other systems reviewed and are negative.     Past Medical History  Diagnosis Date  . Hypertension   . Seizures (HCC)   . Diabetes mellitus without complication (HCC)     Past Surgical History  Procedure Laterality Date  . Knee surgery    . Cholecystectomy    . Radiology with anesthesia N/A 06/11/2015    Procedure: RADIOLOGY WITH ANESTHESIA;  Surgeon: Neelesh Nundkumar, MD;  Location: [facility] OR;  Service: Radiology;  Laterality: N/A;    Family History  Problem Relation Age of Onset  . Alzheimer's disease Mother   . Diabetes Father   . Aneurysm Brother   . Aneurysm Sister     Social History:  Married. 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 He reports that he quit smoking about 27 years ago. He does not have any smokeless tobacco history on file. He has alcoholic drink a couple of times a year. He reports that he does not use illicit drugs.    Allergies: No Known Allergies    Medications Prior to Admission  Medication Sig Dispense Refill  . diclofenac sodium (VOLTAREN) 1 % GEL Apply 1 application topically 2 (two) times daily as needed (pain).    . etodolac (LODINE) 400 MG tablet Take 400 mg by mouth 2 (two) times daily.  3  . FeFum-FePoly-FA-B Cmp-C-Biot (INTEGRA PLUS) CAPS Take 1 capsule by mouth 2 (two) times daily.     . ibuprofen (ADVIL,MOTRIN) 200 MG tablet Take 800 mg by mouth 2 (two) times daily as needed (pain).    . lamoTRIgine (LAMICTAL) 100 MG tablet Take 100 mg by mouth 2 (two) times daily.     .   LORazepam (ATIVAN) 0.5 MG tablet Take 1 mg by mouth See admin instructions. Take 2 tablets (1 mg) by mouth daily at bedtime, may also take 1 tablet (0.5 mg) in the morning as needed for anxiety    . losartan-hydrochlorothiazide (HYZAAR) 100-25 MG per tablet Take 1 tablet by mouth daily.    . metFORMIN (GLUCOPHAGE-XR) 500 MG 24 hr tablet Take 1,500 mg by mouth at bedtime.  2  . naproxen  (NAPROSYN) 500 MG tablet Take 500 mg by mouth 2 (two) times daily as needed for mild pain.   2  . potassium chloride SA (K-DUR,KLOR-CON) 20 MEQ tablet Take 20 mEq by mouth at bedtime.     . Vitamin D, Ergocalciferol, (DRISDOL) 50000 UNITS CAPS capsule Take 50,000 Units by mouth once a week. On Sundays  5    Home: Home Living Family/patient expects to be discharged to:: Inpatient rehab Living Arrangements: Spouse/significant other Available Help at Discharge: Family, Available 24 hours/day Type of Home: House Home Access: Stairs to enter Entrance Stairs-Number of Steps: 6 Entrance Stairs-Rails: Right, Left Home Layout: Two level, 1/2 bath on main level Alternate Level Stairs-Number of Steps: flight Alternate Level Stairs-Rails: Left Bathroom Shower/Tub: Walk-in shower Bathroom Toilet: Handicapped height Bathroom Accessibility: Yes Home Equipment: Walker - 2 wheels, Cane - single point, Crutches  Lives With: Spouse   Functional History: Prior Function Level of Independence: Independent Comments: Works in [place] at [employer].  Functional Status:  Mobility: Bed Mobility Overal bed mobility: Needs Assistance, +2 for physical assistance Bed Mobility: Supine to Sit, Sit to Supine Supine to sit: Mod assist Sit to supine: Min assist General bed mobility comments: Pt reaching hand out for assist to pull up to sitting.  Transfers Overall transfer level: Needs assistance Equipment used: 2 person hand held assist Transfers: Sit to/from Stand Sit to Stand: Mod assist, +2 physical assistance Stand pivot transfers: Mod assist, +2 physical assistance General transfer comment: On standing from 3n1 pt used RW to help himself, He stood about 1 minute with c/o leg pain      ADL: ADL Overall ADL's : Needs assistance/impaired Eating/Feeding: Moderate assistance, Bed level Eating/Feeding Details (indicate cue type and reason): positioned x 2 upright in bed for swallowing Grooming: Wash/dry  face, Bed level, Set up, Supervision/safety Grooming Details (indicate cue type and reason): decreased thoroughness Upper Body Bathing: Maximal assistance, Sitting Lower Body Bathing: Total assistance, Sit to/from stand Upper Body Dressing : Bed level, Maximal assistance Upper Body Dressing Details (indicate cue type and reason): changed soiled gown Lower Body Dressing: Total assistance, Sit to/from stand Toilet Transfer: Total assistance, +2 for physical assistance, Stand-pivot, BSC Toileting- Clothing Manipulation and Hygiene: Total assistance (with mod A +2 sit<>stand)  Cognition: Cognition Overall Cognitive Status: Impaired/Different from baseline Arousal/Alertness: Awake/alert Orientation Level: Oriented to person, Disoriented to time, Oriented to place, Oriented to situation Attention: Focused Focused Attention: Appears intact Memory: Impaired Memory Impairment: Storage deficit, Retrieval deficit Awareness: Impaired Awareness Impairment: Intellectual impairment Problem Solving: Impaired Problem Solving Impairment: Verbal basic Behaviors: Confabulation, Restless, Impulsive Safety/Judgment: Impaired Cognition Arousal/Alertness: Awake/alert Behavior During Therapy: Restless (impulsive to lay back down) Overall Cognitive Status: Impaired/Different from baseline Area of Impairment: Following commands, Safety/judgement, Problem solving Orientation Level: Disoriented to, Time Current Attention Level: Focused Memory: Decreased recall of precautions, Decreased short-term memory Following Commands: Follows one step commands with increased time Safety/Judgement: Decreased awareness of safety, Decreased awareness of deficits Awareness: Emergent Problem Solving: Slow processing, Decreased initiation, Difficulty sequencing, Requires verbal cues, Requires tactile cues General   Comments: Pt with random comments. Concerned about getting his car home.   Blood pressure 151/70, pulse 65,  temperature 97.7 F (36.5 C), temperature source Oral, resp. rate 16, height 5' 11" (1.803 m), weight 89.6 kg (197 lb 8.5 oz), SpO2 100 %. Physical Exam  Nursing note and vitals reviewed. Constitutional: He appears well-developed and well-nourished.  HENT:  Head: Normocephalic and atraumatic.  Small incision on scalp with a couple of sutures and staples--underling bogginess noted. Tender to touch.    Eyes: Conjunctivae are normal. Pupils are equal, round, and reactive to light.  Neck: Neck supple.  Limited ROM laterally and flexion limited due to pain.   Cardiovascular: Normal rate and regular rhythm.   Respiratory: Effort normal and breath sounds normal. No stridor. No respiratory distress. He has no wheezes.  GI: Soft. Bowel sounds are normal. He exhibits no distension. There is no tenderness.  Musculoskeletal: He exhibits no edema.  No tenderness in extremeties  Neurological: He is alert.  Tends to fall asleep easily when not engaged.   A&Ox2.  Some confusion Distracted and lacks insight and awareness of deficits. He was able to follow one and occasional two step motor commands.  Pain with SLR bilaterally.  Motor: B/l UE 4/5 B/l hip flexion 4-/5, ankle dorsi/plantar flexion 4+/5  Skin: Skin is warm and dry.  Psychiatric: Cognition and memory are impaired. He expresses inappropriate judgment. He is inattentive.    Results for orders placed or performed during the hospital encounter of 06/10/15 (from the past 48 hour(s))  Glucose, capillary     Status: Abnormal   Collection Time: 06/25/15  4:49 PM  Result Value Ref Range   Glucose-Capillary 139 (H) 65 - 99 mg/dL   Comment 1 Notify RN    Comment 2 Document in Chart   Glucose, capillary     Status: Abnormal   Collection Time: 06/25/15 10:50 PM  Result Value Ref Range   Glucose-Capillary 172 (H) 65 - 99 mg/dL   Comment 1 Notify RN    Comment 2 Document in Chart   Glucose, capillary     Status: Abnormal   Collection Time:  06/26/15  7:08 AM  Result Value Ref Range   Glucose-Capillary 125 (H) 65 - 99 mg/dL   Comment 1 Notify RN    Comment 2 Document in Chart   Glucose, capillary     Status: Abnormal   Collection Time: 06/26/15 11:59 AM  Result Value Ref Range   Glucose-Capillary 138 (H) 65 - 99 mg/dL   Comment 1 Notify RN    Comment 2 Document in Chart   Glucose, capillary     Status: Abnormal   Collection Time: 06/26/15  4:33 PM  Result Value Ref Range   Glucose-Capillary 143 (H) 65 - 99 mg/dL   Comment 1 Notify RN    Comment 2 Document in Chart   Glucose, capillary     Status: Abnormal   Collection Time: 06/26/15 11:09 PM  Result Value Ref Range   Glucose-Capillary 128 (H) 65 - 99 mg/dL   Comment 1 Notify RN    Comment 2 Document in Chart   Glucose, capillary     Status: Abnormal   Collection Time: 06/27/15  6:50 AM  Result Value Ref Range   Glucose-Capillary 121 (H) 65 - 99 mg/dL   Comment 1 Notify RN    Comment 2 Document in Chart   Glucose, capillary     Status: Abnormal   Collection Time: 06/27/15 11:30 AM    Result Value Ref Range   Glucose-Capillary 127 (H) 65 - 99 mg/dL   No results found.   Medical Problem List and Plan: 1.  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  2.  DVT Prophylaxis/Anticoagulation: Mechanical: Sequential compression devices, below knee Bilateral lower extremities Will check dopplers as complaining of BLE/thigh pain.  3. Pain Management: Hydrocodone providing minimal relief. Will try oxycodone prn for headaches and back pain.  4. Mood: LCSW to follow for evaluation and support.  5. Neuropsych: This patient is not fully capable of making decisions on his own behalf. 6. Skin/Wound Care: routine pressure relief measures.  7. Fluids/Electrolytes/Nutrition: Has been poor in part due to lethargy. Will add nutritional supplements. Monitor for now. Check lytes in am.  8. HTN: Monitor BP  tid and resume low dose Cozaar. Titrate medications as indicated  9. DM type 2: Monitor BS ac/hs basis. Po intake has been variable due to lethargy and cognitive issues. Wife encouraged to bring pureed foods from home. Will continue to hold metformin and use SSI for now.  10. Seizures disorder: Was on lamictal bid prior to admission. On  keppra bid additionally now. . Change keppra to po route. 11. ABLA: continue iron supplement. Check lytes in am. 12. Mild ileus:  Has resolved with resolution of abdominal pain and distension. He has been refusing amitiza as well as doses of sennokot.  Will modify and simplify bowel regimen to two Senna S bid. Use enema prn.  13. Hypokalemia: Supplement to avoid recurrence of ileus.  14. Leucocytosis: with complaints of back pain. Will check UA/UCS as has elevated white count.  15.  Chronic right knee pain: Off NSAIDs. Will resume Voltaren gel.  16. PVCs: Negative cardiac work up 05/2015 17. Safety: Continue enclosure bed for fall prevention.    Post Admission Physician Evaluation: 1. 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 . 2. Patient is admitted to receive collaborative, interdisciplinary care between the physiatrist, rehab nursing staff, and therapy team. 3. Patient's level of medical complexity and substantial therapy needs in context of that medical necessity cannot be provided at a lesser intensity of care such as a SNF. 4. Patient has experienced substantial functional loss from his/her baseline which was documented above under the "Functional History" and "Functional Status" headings.  Judging by the patient's diagnosis, physical exam, and functional history, the patient has potential for functional progress which will result in measurable gains while on inpatient rehab.  These gains will be of substantial and practical use upon discharge  in facilitating mobility and self-care at the household level. 5. Physiatrist  will provide 24 hour management of medical needs as well as oversight of the therapy plan/treatment and provide guidance as appropriate regarding the interaction of the two. 6. 24 hour rehab nursing will assist with and help integrate therapy concepts, techniques,education, etc. 7. PT will assess and treat for/with: Lower extremity strength, range of motion, stamina, balance, functional mobility, safety, adaptive techniques and equipment, woundcare, coping skills, pain control, stroke education.   Goals are: Supervision/Min A. 8. OT will assess and treat for/with: ADL's, functional mobility, safety, upper extremity strength, adaptive techniques and equipment, wound mgt, ego support, and community reintegration.   Goals are: Mod I/supervision. Therapy may not proceed with showering this patient. 9. SLP will assess and treat for/with: cognition, speech, swallowing, higher level processing.  Goals are: Min A/Supervision. 10. Case Management and Social Worker will assess   and treat for psychological issues and discharge planning. 11. Team conference will be held weekly to assess progress toward goals and to determine barriers to discharge. 12. Patient will receive at least 3 hours of therapy per day at least 5 days per week. 13. ELOS: 16-19 days.       14. Prognosis:  excellent  Ankit Patel, MD 06/27/2015 

## 2015-06-26 LAB — GLUCOSE, CAPILLARY
Glucose-Capillary: 125 mg/dL — ABNORMAL HIGH (ref 65–99)
Glucose-Capillary: 138 mg/dL — ABNORMAL HIGH (ref 65–99)
Glucose-Capillary: 143 mg/dL — ABNORMAL HIGH (ref 65–99)
Glucose-Capillary: 128 mg/dL — ABNORMAL HIGH (ref 65–99)

## 2015-06-26 MED ORDER — DEXAMETHASONE SODIUM PHOSPHATE 4 MG/ML IJ SOLN
1.0000 mg | Freq: Two times a day (BID) | INTRAMUSCULAR | Status: DC
  Administered 2015-06-26 – 2015-06-27 (×2): 1 mg via INTRAVENOUS
  Filled 2015-06-26 (×2): qty 1

## 2015-06-26 NOTE — Progress Notes (Signed)
I visited pt. at the bedside.  He appears much more comfortable today and denies abdominal pain. Did have results with mag citrate.   I do not have an available bed to offer pt. today.  I will tentatively plan to admit pt. tomorrow if he is medically ready and bed is available.. I have updated Derenda Fennel, CSW for need for SNF backup if pt. pt.  medically ready before I have bed availability.  Please call if questions.  Weldon Picking PT Inpatient Rehab Admissions Coordinator Cell [phone] Office [phone]

## 2015-06-26 NOTE — Progress Notes (Signed)
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  pt intubated 1/8 - 1/10.  pt with hx of HTN, Seizures, and DM.   OT comments  This 65 yo male admitted with above presents to acute OT making progress with orientation, following commands, basic ADLs, and mobility. He will continue to benefit from acute OT with follow up OT on CIR.  Follow Up Recommendations  CIR;Supervision/Assistance - 24 hour    Equipment Recommendations   (TBD next venue)       Precautions / Restrictions Precautions Precautions: Fall Precaution Comments: vail bed Restrictions Weight Bearing Restrictions: No       Mobility Bed Mobility Overal bed mobility: Needs Assistance;+2 for physical assistance Bed Mobility: Supine to Sit;Sit to Supine     Supine to sit: Mod assist Sit to supine: Min assist      Transfers Overall transfer level: Needs assistance Equipment used: 2 person hand held assist Transfers: Sit to/from Stand Sit to Stand: Mod assist;+2 physical assistance Stand pivot transfers: Mod assist;+2 physical assistance       General transfer comment: On standing from 3n1 pt used RW to help himself, He stood about 1 minute with c/o leg pain    Balance Overall balance assessment: Needs assistance Sitting-balance support: Feet supported;Single extremity supported Sitting balance-Leahy Scale: Poor Sitting balance - Comments: reliant on at least 1 UE, he will controlled posterior lean to lay back on bed   Standing balance support: Bilateral upper extremity supported;During functional activity Standing balance-Leahy Scale: Poor Standing balance comment: Reliant on RW and/or therapists                   ADL Overall ADL's : Needs assistance/impaired     Grooming: Wash/dry face;Bed level;Set up;Supervision/safety                   Toilet Transfer: Total assistance;+2 for physical assistance;Stand-pivot;BSC   Toileting- Clothing Manipulation and Hygiene: Total assistance (with mod A +2 sit<>stand)                          Cognition   Behavior During Therapy: Restless (impulsive to lay back down) Overall Cognitive Status: Impaired/Different from baseline Area of Impairment: Following commands;Safety/judgement;Problem solving Orientation Level:  (oriented to place, month, and off initally by one day for day (Wed))      Following Commands: Follows one step commands with increased time Safety/Judgement: Decreased awareness of safety;Decreased awareness of deficits   Problem Solving: Slow processing;Decreased initiation;Difficulty sequencing;Requires verbal cues;Requires tactile cues                   Pertinent Vitals/ Pain       Pain Assessment: Faces Faces Pain Scale: Hurts even more Pain Location: my legs Pain Descriptors / Indicators: Aching;Sore Pain Intervention(s): Monitored during session;Repositioned         Frequency Min 3X/week     Progress Toward Goals  OT Goals(current goals can now be found in the care plan section)  Progress towards OT goals: Progressing toward goals     Plan Discharge plan remains appropriate    Co-evaluation      Reason for Co-Treatment: Complexity of the patient's impairments (multi-system involvement);For patient/therapist safety;Necessary to address cognition/behavior during functional activity   OT goals addressed during session: ADL's and self-care;Strengthening/ROM  End of Session Equipment Utilized During Treatment: Gait belt;Rolling walker   Activity Tolerance Patient tolerated treatment well   Patient Left in bed;with call bell/phone within reach (vail bed zipped and snapped together)   Nurse Communication  (IV needs to be hooked back up)        Time: 1610-9604 OT Time Calculation (min): 34 min  Charges: OT  General Charges $OT Visit: 1 Procedure OT Treatments $Self Care/Home Management : 8-22 mins  Evette Georges [phone] 06/26/2015, 12:20 PM

## 2015-06-26 NOTE — Progress Notes (Signed)
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  pt intubated 1/8 - 1/10.  pt with hx of HTN, Seizures, and DM.    PT Comments    Pt progressing towards physical therapy goals. Showed improvement in following basic commands this session and was able to transition OOB to/from [facility] with +2 assist. Feel pt will thrive in the CIR setting for continued physical therapy.   Follow Up Recommendations  CIR     Equipment Recommendations  None recommended by PT    Recommendations for Other Services       Precautions / Restrictions Precautions Precautions: Fall Precaution Comments: vail bed Restrictions Weight Bearing Restrictions: No    Mobility  Bed Mobility Overal bed mobility: Needs Assistance;+2 for physical assistance Bed Mobility: Supine to Sit;Sit to Supine     Supine to sit: Mod assist Sit to supine: Min assist   General bed mobility comments: Pt reaching hand out for assist to pull up to sitting.   Transfers Overall transfer level: Needs assistance Equipment used: 2 person hand held assist Transfers: Sit to/from Stand Sit to Stand: Mod assist;+2 physical assistance Stand pivot transfers: Mod assist;+2 physical assistance       General transfer comment: On standing from 3n1 pt used RW to help himself, He stood about 1 minute with c/o leg pain  Ambulation/Gait                 Stairs            Wheelchair Mobility    Modified Rankin (Stroke Patients Only) Modified Rankin (Stroke Patients Only) Pre-Morbid Rankin Score: No symptoms Modified Rankin: Severe disability     Balance Overall balance assessment: Needs assistance Sitting-balance support: Feet supported;Single extremity supported Sitting balance-Leahy Scale: Poor Sitting balance - Comments: reliant on at least 1 UE, he will  controlled posterior lean to lay back on bed   Standing balance support: Bilateral upper extremity supported;During functional activity Standing balance-Leahy Scale: Poor Standing balance comment: Reliant on RW and therapists                    Cognition Arousal/Alertness: Awake/alert Behavior During Therapy: Restless (impulsive to lay back down) Overall Cognitive Status: Impaired/Different from baseline Area of Impairment: Following commands;Safety/judgement;Problem solving Orientation Level: Disoriented to;Time     Following Commands: Follows one step commands with increased time Safety/Judgement: Decreased awareness of safety;Decreased awareness of deficits Awareness: Emergent Problem Solving: Slow processing;Decreased initiation;Difficulty sequencing;Requires verbal cues;Requires tactile cues      Exercises      General Comments        Pertinent Vitals/Pain Pain Assessment: Faces Faces Pain Scale: Hurts even more Pain Location: legs, neck Pain Descriptors / Indicators: Aching;Sore Pain Intervention(s): Limited activity within patient's tolerance;Monitored during session;Repositioned    Home Living                      Prior Function            PT Goals (current goals can now be found in the care plan section) Acute Rehab PT Goals PT Goal Formulation: With patient Time For Goal Achievement: 06/29/15 Potential to Achieve Goals: Good Progress towards PT goals: Progressing toward goals    Frequency  Min 3X/week    PT Plan Frequency needs to be updated    Co-evaluation PT/OT/SLP  Co-Evaluation/Treatment: Yes Reason for Co-Treatment: Complexity of the patient's impairments (multi-system involvement);For patient/therapist safety;Necessary to address cognition/behavior during functional activity PT goals addressed during session: Mobility/safety with mobility;Balance;Proper use of DME OT goals addressed during session: ADL's and  self-care;Strengthening/ROM     End of Session Equipment Utilized During Treatment: Gait belt Activity Tolerance: Patient limited by fatigue;Patient limited by pain Patient left: in bed;with call bell/phone within reach (Veil bed secured)     Time: 1610-9604 PT Time Calculation (min) (ACUTE ONLY): 33 min  Charges:  $Therapeutic Activity: 8-22 mins                    G Codes:      Conni Slipper 07/06/2015, 1:36 PM   Conni Slipper, PT, DPT Acute Rehabilitation Services Pager: [phone]

## 2015-06-26 NOTE — Progress Notes (Signed)
No issues overnight. Pt has minimal abd pain today, had BM with mag citrate. Manageable HA.  EXAM:  BP 139/86 mmHg  Pulse 69  Temp(Src) 98.3 F (36.8 C) (Oral)  Resp 17  Ht  (1.803 m)  Wt 89.6 kg (197 lb 8.5 oz)  BMI 27.56 kg/m2  SpO2 98%  Awake, alert, oriented  Speech fluent, appropriate  CN grossly intact  Mild right weakness  IMPRESSION:  65 y.o. male s/p SAH, remains neurologically stable. Medically ready for d/c.  PLAN: - Decrease steroid to  BID. Can stop in 2 days - Transfer to CIR when bed available

## 2015-06-27 ENCOUNTER — Encounter (HOSPITAL_COMMUNITY): Payer: Self-pay | Admitting: Physical Medicine and Rehabilitation

## 2015-06-27 ENCOUNTER — Inpatient Hospital Stay (HOSPITAL_COMMUNITY)
Admission: AD | Admit: 2015-06-27 | Discharge: 2015-07-24 | DRG: 948 | Disposition: A | Discharge status: Home Health | Payer: BC Managed Care – PPO | Source: Intra-hospital | Attending: Physical Medicine & Rehabilitation | Admitting: Physical Medicine & Rehabilitation

## 2015-06-27 DIAGNOSIS — E119 Type 2 diabetes mellitus without complications: Secondary | ICD-10-CM | POA: Diagnosis present

## 2015-06-27 DIAGNOSIS — K567 Ileus, unspecified: Secondary | ICD-10-CM | POA: Insufficient documentation

## 2015-06-27 DIAGNOSIS — I493 Ventricular premature depolarization: Secondary | ICD-10-CM | POA: Insufficient documentation

## 2015-06-27 DIAGNOSIS — R5383 Other fatigue: Principal | ICD-10-CM | POA: Diagnosis present

## 2015-06-27 DIAGNOSIS — Z7984 Long term (current) use of oral hypoglycemic drugs: Secondary | ICD-10-CM | POA: Diagnosis not present

## 2015-06-27 DIAGNOSIS — R4587 Impulsiveness: Secondary | ICD-10-CM | POA: Insufficient documentation

## 2015-06-27 DIAGNOSIS — Z781 Physical restraint status: Secondary | ICD-10-CM

## 2015-06-27 DIAGNOSIS — G8929 Other chronic pain: Secondary | ICD-10-CM | POA: Diagnosis present

## 2015-06-27 DIAGNOSIS — E876 Hypokalemia: Secondary | ICD-10-CM | POA: Diagnosis present

## 2015-06-27 DIAGNOSIS — D62 Acute posthemorrhagic anemia: Secondary | ICD-10-CM | POA: Insufficient documentation

## 2015-06-27 DIAGNOSIS — I82402 Acute embolism and thrombosis of unspecified deep veins of left lower extremity: Secondary | ICD-10-CM | POA: Diagnosis present

## 2015-06-27 DIAGNOSIS — I10 Essential (primary) hypertension: Secondary | ICD-10-CM | POA: Insufficient documentation

## 2015-06-27 DIAGNOSIS — I82412 Acute embolism and thrombosis of left femoral vein: Secondary | ICD-10-CM | POA: Diagnosis not present

## 2015-06-27 DIAGNOSIS — E118 Type 2 diabetes mellitus with unspecified complications: Secondary | ICD-10-CM | POA: Diagnosis not present

## 2015-06-27 DIAGNOSIS — R410 Disorientation, unspecified: Secondary | ICD-10-CM | POA: Insufficient documentation

## 2015-06-27 DIAGNOSIS — I609 Nontraumatic subarachnoid hemorrhage, unspecified: Secondary | ICD-10-CM

## 2015-06-27 DIAGNOSIS — I69919 Unspecified symptoms and signs involving cognitive functions following unspecified cerebrovascular disease: Secondary | ICD-10-CM | POA: Diagnosis not present

## 2015-06-27 DIAGNOSIS — F919 Conduct disorder, unspecified: Secondary | ICD-10-CM | POA: Diagnosis present

## 2015-06-27 DIAGNOSIS — R52 Pain, unspecified: Secondary | ICD-10-CM | POA: Insufficient documentation

## 2015-06-27 DIAGNOSIS — M549 Dorsalgia, unspecified: Secondary | ICD-10-CM | POA: Diagnosis present

## 2015-06-27 DIAGNOSIS — R4189 Other symptoms and signs involving cognitive functions and awareness: Secondary | ICD-10-CM | POA: Diagnosis present

## 2015-06-27 DIAGNOSIS — G40909 Epilepsy, unspecified, not intractable, without status epilepticus: Secondary | ICD-10-CM | POA: Diagnosis present

## 2015-06-27 DIAGNOSIS — Z659 Problem related to unspecified psychosocial circumstances: Secondary | ICD-10-CM | POA: Diagnosis not present

## 2015-06-27 DIAGNOSIS — D72829 Elevated white blood cell count, unspecified: Secondary | ICD-10-CM | POA: Insufficient documentation

## 2015-06-27 DIAGNOSIS — R296 Repeated falls: Secondary | ICD-10-CM | POA: Diagnosis present

## 2015-06-27 DIAGNOSIS — Z79899 Other long term (current) drug therapy: Secondary | ICD-10-CM

## 2015-06-27 DIAGNOSIS — M25561 Pain in right knee: Secondary | ICD-10-CM | POA: Insufficient documentation

## 2015-06-27 DIAGNOSIS — I82432 Acute embolism and thrombosis of left popliteal vein: Secondary | ICD-10-CM | POA: Diagnosis not present

## 2015-06-27 DIAGNOSIS — Z87891 Personal history of nicotine dependence: Secondary | ICD-10-CM | POA: Diagnosis not present

## 2015-06-27 DIAGNOSIS — Z982 Presence of cerebrospinal fluid drainage device: Secondary | ICD-10-CM

## 2015-06-27 DIAGNOSIS — R419 Unspecified symptoms and signs involving cognitive functions and awareness: Secondary | ICD-10-CM | POA: Insufficient documentation

## 2015-06-27 DIAGNOSIS — W19XXXA Unspecified fall, initial encounter: Secondary | ICD-10-CM | POA: Insufficient documentation

## 2015-06-27 DIAGNOSIS — I6911 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: Secondary | ICD-10-CM

## 2015-06-27 DIAGNOSIS — R638 Other symptoms and signs concerning food and fluid intake: Secondary | ICD-10-CM | POA: Insufficient documentation

## 2015-06-27 DIAGNOSIS — R0781 Pleurodynia: Secondary | ICD-10-CM

## 2015-06-27 DIAGNOSIS — R569 Unspecified convulsions: Secondary | ICD-10-CM | POA: Insufficient documentation

## 2015-06-27 DIAGNOSIS — M79609 Pain in unspecified limb: Secondary | ICD-10-CM | POA: Diagnosis present

## 2015-06-27 HISTORY — DX: Acute embolism and thrombosis of left femoral vein: I82.412

## 2015-06-27 LAB — GLUCOSE, CAPILLARY
Glucose-Capillary: 121 mg/dL — ABNORMAL HIGH (ref 65–99)
Glucose-Capillary: 127 mg/dL — ABNORMAL HIGH (ref 65–99)
Glucose-Capillary: 138 mg/dL — ABNORMAL HIGH (ref 65–99)
Glucose-Capillary: 112 mg/dL — ABNORMAL HIGH (ref 65–99)

## 2015-06-27 MED ORDER — GUAIFENESIN-DM 100-10 MG/5ML PO SYRP: 5.0000 mL | ORAL_SOLUTION | Freq: Four times a day (QID) | ORAL | Status: DC | PRN

## 2015-06-27 MED ORDER — GLUCERNA 1.2 CAL PO LIQD
237.0000 mL | Freq: Two times a day (BID) | ORAL | Status: DC
  Administered 2015-06-28: 237 mL via ORAL
  Filled 2015-06-27 (×5): qty 237

## 2015-06-27 MED ORDER — PROCHLORPERAZINE 25 MG RE SUPP: 12.5000 mg | Freq: Four times a day (QID) | RECTAL | Status: DC | PRN

## 2015-06-27 MED ORDER — LOSARTAN POTASSIUM 50 MG PO TABS
25.0000 mg | ORAL_TABLET | Freq: Every day | ORAL | Status: DC
  Administered 2015-06-28 – 2015-07-24 (×27): 25 mg via ORAL
  Filled 2015-06-27 (×27): qty 1

## 2015-06-27 MED ORDER — SIMVASTATIN 20 MG PO TABS
20.0000 mg | ORAL_TABLET | Freq: Every day | ORAL | Status: DC
  Administered 2015-06-27 – 2015-07-23 (×27): 20 mg via ORAL
  Filled 2015-06-27 (×27): qty 1

## 2015-06-27 MED ORDER — SENNOSIDES-DOCUSATE SODIUM 8.6-50 MG PO TABS
2.0000 | ORAL_TABLET | Freq: Two times a day (BID) | ORAL | Status: DC
  Administered 2015-06-27 – 2015-07-12 (×29): 2 via ORAL
  Filled 2015-06-27 (×29): qty 2

## 2015-06-27 MED ORDER — INSULIN ASPART 100 UNIT/ML ~~LOC~~ SOLN
0.0000 [IU] | Freq: Every day | SUBCUTANEOUS | Status: DC
  Administered 2015-07-21: 3 [IU] via SUBCUTANEOUS

## 2015-06-27 MED ORDER — ACETAMINOPHEN 325 MG PO TABS
325.0000 mg | ORAL_TABLET | ORAL | Status: DC | PRN
  Administered 2015-06-29: 650 mg via ORAL
  Administered 2015-06-30: 325 mg via ORAL
  Administered 2015-07-03 – 2015-07-24 (×13): 650 mg via ORAL
  Filled 2015-06-27 (×10): qty 2
  Filled 2015-06-27: qty 1
  Filled 2015-06-27 (×4): qty 2

## 2015-06-27 MED ORDER — METHOCARBAMOL 500 MG PO TABS
500.0000 mg | ORAL_TABLET | Freq: Four times a day (QID) | ORAL | Status: DC | PRN
  Administered 2015-06-28 – 2015-07-13 (×8): 500 mg via ORAL
  Filled 2015-06-27 (×8): qty 1

## 2015-06-27 MED ORDER — DIPHENHYDRAMINE HCL 12.5 MG/5ML PO ELIX: 12.5000 mg | ORAL_SOLUTION | Freq: Four times a day (QID) | ORAL | Status: DC | PRN

## 2015-06-27 MED ORDER — DEXAMETHASONE SODIUM PHOSPHATE 4 MG/ML IJ SOLN
1.0000 mg | Freq: Two times a day (BID) | INTRAMUSCULAR | Status: AC
  Administered 2015-06-27 – 2015-06-28 (×2): 1 mg via INTRAVENOUS
  Filled 2015-06-27 (×2): qty 0.25

## 2015-06-27 MED ORDER — RESOURCE THICKENUP CLEAR PO POWD
ORAL | Status: DC | PRN
  Filled 2015-06-27: qty 125

## 2015-06-27 MED ORDER — FE FUMARATE-B12-VIT C-FA-IFC PO CAPS
1.0000 | ORAL_CAPSULE | Freq: Two times a day (BID) | ORAL | Status: DC
  Administered 2015-06-27 – 2015-07-24 (×54): 1 via ORAL
  Filled 2015-06-27 (×56): qty 1

## 2015-06-27 MED ORDER — INSULIN ASPART 100 UNIT/ML ~~LOC~~ SOLN: 0.0000 [IU] | Freq: Three times a day (TID) | SUBCUTANEOUS | Status: DC

## 2015-06-27 MED ORDER — DEXAMETHASONE SODIUM PHOSPHATE 4 MG/ML IJ SOLN: 1.0000 mg | Freq: Two times a day (BID) | INTRAMUSCULAR | Status: DC

## 2015-06-27 MED ORDER — ALUM & MAG HYDROXIDE-SIMETH 200-200-20 MG/5ML PO SUSP: 30.0000 mL | ORAL | Status: DC | PRN

## 2015-06-27 MED ORDER — PROCHLORPERAZINE MALEATE 5 MG PO TABS
5.0000 mg | ORAL_TABLET | Freq: Four times a day (QID) | ORAL | Status: DC | PRN
  Administered 2015-07-06: 10 mg via ORAL
  Filled 2015-06-27: qty 2

## 2015-06-27 MED ORDER — DICLOFENAC SODIUM 1 % TD GEL
2.0000 g | Freq: Four times a day (QID) | TRANSDERMAL | Status: DC
  Administered 2015-06-27 – 2015-07-24 (×49): 2 g via TOPICAL
  Filled 2015-06-27: qty 100

## 2015-06-27 MED ORDER — VITAMIN D (ERGOCALCIFEROL) 1.25 MG (50000 UNIT) PO CAPS
50000.0000 [IU] | ORAL_CAPSULE | ORAL | Status: DC
  Filled 2015-06-27: qty 1

## 2015-06-27 MED ORDER — INSULIN ASPART 100 UNIT/ML ~~LOC~~ SOLN: 0.0000 [IU] | Freq: Every day | SUBCUTANEOUS | Status: DC

## 2015-06-27 MED ORDER — PROCHLORPERAZINE EDISYLATE 5 MG/ML IJ SOLN: 5.0000 mg | Freq: Four times a day (QID) | INTRAMUSCULAR | Status: DC | PRN

## 2015-06-27 MED ORDER — POTASSIUM CHLORIDE CRYS ER 20 MEQ PO TBCR
20.0000 meq | EXTENDED_RELEASE_TABLET | Freq: Two times a day (BID) | ORAL | Status: AC
  Administered 2015-06-27 – 2015-06-28 (×2): 20 meq via ORAL
  Filled 2015-06-27 (×2): qty 1

## 2015-06-27 MED ORDER — HYDROCODONE-ACETAMINOPHEN 5-325 MG PO TABS: 2.0000 | ORAL_TABLET | ORAL | Status: DC | PRN

## 2015-06-27 MED ORDER — NALOXEGOL OXALATE 25 MG PO TABS: 25.0000 mg | ORAL_TABLET | Freq: Every day | ORAL | Status: DC

## 2015-06-27 MED ORDER — INSULIN ASPART 100 UNIT/ML ~~LOC~~ SOLN
0.0000 [IU] | Freq: Three times a day (TID) | SUBCUTANEOUS | Status: DC
Start: 2015-06-27 — End: 2015-07-24
  Administered 2015-06-28 – 2015-06-29 (×5): 2 [IU] via SUBCUTANEOUS
  Administered 2015-06-29: 3 [IU] via SUBCUTANEOUS
  Administered 2015-06-30 (×2): 2 [IU] via SUBCUTANEOUS
  Administered 2015-07-01 (×2): 3 [IU] via SUBCUTANEOUS
  Administered 2015-07-02 – 2015-07-04 (×8): 2 [IU] via SUBCUTANEOUS
  Administered 2015-07-04 – 2015-07-05 (×3): 3 [IU] via SUBCUTANEOUS
  Administered 2015-07-05: 2 [IU] via SUBCUTANEOUS
  Administered 2015-07-06: 3 [IU] via SUBCUTANEOUS
  Administered 2015-07-06 – 2015-07-07 (×3): 2 [IU] via SUBCUTANEOUS
  Administered 2015-07-08 – 2015-07-09 (×2): 3 [IU] via SUBCUTANEOUS
  Administered 2015-07-10 – 2015-07-11 (×3): 2 [IU] via SUBCUTANEOUS
  Administered 2015-07-12: 3 [IU] via SUBCUTANEOUS
  Administered 2015-07-14 – 2015-07-23 (×11): 2 [IU] via SUBCUTANEOUS

## 2015-06-27 MED ORDER — OXYCODONE HCL 5 MG PO TABS
5.0000 mg | ORAL_TABLET | ORAL | Status: DC | PRN
  Administered 2015-06-27 – 2015-07-15 (×56): 10 mg via ORAL
  Administered 2015-07-15: 5 mg via ORAL
  Administered 2015-07-15 – 2015-07-22 (×15): 10 mg via ORAL
  Administered 2015-07-22: 5 mg via ORAL
  Administered 2015-07-22 – 2015-07-23 (×3): 10 mg via ORAL
  Administered 2015-07-23: 5 mg via ORAL
  Administered 2015-07-24: 10 mg via ORAL
  Filled 2015-06-27 (×76): qty 2
  Filled 2015-06-27: qty 1

## 2015-06-27 MED ORDER — LEVETIRACETAM 500 MG PO TABS
500.0000 mg | ORAL_TABLET | Freq: Two times a day (BID) | ORAL | Status: DC
  Administered 2015-06-27 – 2015-07-02 (×10): 500 mg via ORAL
  Filled 2015-06-27 (×11): qty 1

## 2015-06-27 MED ORDER — FLEET ENEMA 7-19 GM/118ML RE ENEM
1.0000 | ENEMA | Freq: Once | RECTAL | Status: AC | PRN
  Administered 2015-07-12: 1 via RECTAL
  Filled 2015-06-27: qty 1

## 2015-06-27 MED ORDER — BISACODYL 10 MG RE SUPP
10.0000 mg | Freq: Every day | RECTAL | Status: DC | PRN
  Administered 2015-07-12: 10 mg via RECTAL
  Filled 2015-06-27: qty 1

## 2015-06-27 MED ORDER — TRAZODONE HCL 50 MG PO TABS
25.0000 mg | ORAL_TABLET | Freq: Every evening | ORAL | Status: DC | PRN
  Administered 2015-06-28: 25 mg via ORAL
  Administered 2015-07-02 – 2015-07-18 (×11): 50 mg via ORAL
  Filled 2015-06-27 (×12): qty 1

## 2015-06-27 MED ORDER — LAMOTRIGINE 100 MG PO TABS
100.0000 mg | ORAL_TABLET | Freq: Two times a day (BID) | ORAL | Status: DC
  Administered 2015-06-27 – 2015-07-24 (×54): 100 mg via ORAL
  Filled 2015-06-27 (×59): qty 1

## 2015-06-27 NOTE — Progress Notes (Signed)
Patient arrived to [address] from [address], report given by Jeannett Senior, Charity fundraiser. Patient with complaints of a headache. Patient and patient's wife at bedside given admission information, and verbalized understanding. Patient in vail bed, oriented to room, and call bell. Will continue to monitor patient.

## 2015-06-27 NOTE — H&P (View-Only) (Signed)
Physical Medicine and Rehabilitation Admission H&P    Chief Complaint  Patient presents with  . Lethargy, cognitive deficits, balance deficits.     HPI: Yoniel Arkwright is a 65 y.o. male with history fo HTN, DM type 2, PVCs, seizures who was seen in ED 06/10/15 with severe HA but CT head without acute changes. Late that day he had worsening of HA followed by N/V later with unresponsiveness. Patient intubated and sedated in ED. 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 He was evaluated by Dr. Wynetta Emery and ventriculostomy placed for management of hydrocephalus. 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 He tolerated extubation on 01/10 without difficulty and has been monitored closely for vasospasms. Headaches have improved but patient with balance deficits, back pain, poor safety awareness requiring restraints, fluctuating mental status with confusion and disorientation. EVD drain removed 01/19 as CT head showing improvement.  He is showing improvement in mentation and participation but noted to have wet voice with abdominal distension and pain. CIR recommended by MD and rehab team.    Review of Systems  Constitutional: Negative for diaphoresis.  HENT: Negative for hearing loss.   Eyes: Negative for blurred vision and double vision.  Respiratory: Negative for cough, sputum production and shortness of breath.   Cardiovascular: Negative for chest pain and palpitations.  Gastrointestinal: Negative for heartburn, nausea and abdominal pain.  Genitourinary: Negative for dysuria and urgency.  Musculoskeletal: Positive for myalgias, back pain (since hospitalization) and joint pain (chronic right knee pain).  Neurological: Positive for dizziness, speech change, weakness and headaches ("constant").  Psychiatric/Behavioral:  Positive for memory loss. Negative for substance abuse. The patient does not have insomnia.   All other systems reviewed and are negative.     Past Medical History  Diagnosis Date  . Hypertension   . Seizures (HCC)   . Diabetes mellitus without complication [facility])     Past Surgical History  Procedure Laterality Date  . Knee surgery    . Cholecystectomy    . Radiology with anesthesia N/A 06/11/2015    Procedure: RADIOLOGY WITH ANESTHESIA;  Surgeon: Lisbeth Renshaw, MD;  Location: [facility] OR;  Service: Radiology;  Laterality: N/A;    Family History  Problem Relation Age of Onset  . Alzheimer's disease Mother   . Diabetes Father   . Aneurysm Brother   . Aneurysm Sister     Social History:  Married. Works as an Airline pilot at [employer] He reports that he quit smoking about 27 years ago. He does not have any smokeless tobacco history on file. He has alcoholic drink a couple of times a year. He reports that he does not use illicit drugs.    Allergies: No Known Allergies    Medications Prior to Admission  Medication Sig Dispense Refill  . diclofenac sodium (VOLTAREN) 1 % GEL Apply 1 application topically 2 (two) times daily as needed (pain).    Marland Kitchen etodolac (LODINE) 400 MG tablet Take 400 mg by mouth 2 (two) times daily.  3  . FeFum-FePoly-FA-B Cmp-C-Biot (INTEGRA PLUS) CAPS Take 1 capsule by mouth 2 (two) times daily.     Marland Kitchen ibuprofen (ADVIL,MOTRIN) 200 MG tablet Take 800 mg by mouth 2 (two) times daily as needed (pain).    Marland Kitchen lamoTRIgine (LAMICTAL) 100 MG tablet Take 100 mg by mouth 2 (two) times daily.     Marland Kitchen  LORazepam (ATIVAN) 0.5 MG tablet Take 1 mg by mouth See admin instructions. Take 2 tablets (1 mg) by mouth daily at bedtime, may also take 1 tablet (0.5 mg) in the morning as needed for anxiety    . losartan-hydrochlorothiazide (HYZAAR) 100-25 MG per tablet Take 1 tablet by mouth daily.    . metFORMIN (GLUCOPHAGE-XR) 500 MG 24 hr tablet Take 1,500 mg by mouth at bedtime.  2  . naproxen  (NAPROSYN) 500 MG tablet Take 500 mg by mouth 2 (two) times daily as needed for mild pain.   2  . potassium chloride SA (K-DUR,KLOR-CON) 20 MEQ tablet Take 20 mEq by mouth at bedtime.     . Vitamin D, Ergocalciferol, (DRISDOL) 50000 UNITS CAPS capsule Take 50,000 Units by mouth once a week. On Sundays  5    Home: Home Living Family/patient expects to be discharged to:: Inpatient rehab Living Arrangements: Spouse/significant other Available Help at Discharge: Family, Available 24 hours/day Type of Home: House Home Access: Stairs to enter [employer] of Steps: 6 Entrance Stairs-Rails: Right, Left Home Layout: Two level, 1/2 bath on main level Alternate Level Stairs-Number of Steps: flight Alternate Level Stairs-Rails: Left Bathroom Shower/Tub: Health visitor: Handicapped height Bathroom Accessibility: Yes Home Equipment: Environmental consultant - 2 wheels, Cane - single point, Crutches  Lives With: Spouse   Functional History: Prior Function Level of Independence: Independent 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  Functional Status:  Mobility: Bed Mobility Overal bed mobility: Needs Assistance, +2 for physical assistance Bed Mobility: Supine to Sit, Sit to Supine Supine to sit: Mod assist Sit to supine: Min assist General bed mobility comments: Pt reaching hand out for assist to pull up to sitting.  Transfers Overall transfer level: Needs assistance Equipment used: 2 person hand held assist Transfers: Sit to/from Stand Sit to Stand: Mod assist, +2 physical assistance Stand pivot transfers: Mod assist, +2 physical assistance General transfer comment: On standing from 3n1 pt used RW to help himself, He stood about 1 minute with c/o leg pain      ADL: ADL Overall ADL's : Needs assistance/impaired Eating/Feeding: Moderate assistance, Bed level Eating/Feeding Details (indicate cue type and reason): positioned x 2 upright in bed for swallowing Grooming: Wash/dry  face, Bed level, Set up, Supervision/safety Grooming Details (indicate cue type and reason): decreased thoroughness Upper Body Bathing: Maximal assistance, Sitting Lower Body Bathing: Total assistance, Sit to/from stand Upper Body Dressing : Bed level, Maximal assistance Upper Body Dressing Details (indicate cue type and reason): changed soiled gown Lower Body Dressing: Total assistance, Sit to/from stand Toilet Transfer: Total assistance, +2 for physical assistance, Stand-pivot, BSC Toileting- Clothing Manipulation and Hygiene: Total assistance (with mod A +2 sit<>stand)  Cognition: Cognition Overall Cognitive Status: Impaired/Different from baseline Arousal/Alertness: Awake/alert Orientation Level: Oriented to person, Disoriented to time, Oriented to place, Oriented to situation Attention: Focused Focused Attention: Appears intact Memory: Impaired Memory Impairment: Storage deficit, Retrieval deficit Awareness: Impaired Awareness Impairment: Intellectual impairment Problem Solving: Impaired Problem Solving Impairment: Verbal basic Behaviors: Confabulation, Restless, Impulsive Safety/Judgment: Impaired Cognition Arousal/Alertness: Awake/alert Behavior During Therapy: Restless (impulsive to lay back down) Overall Cognitive Status: Impaired/Different from baseline Area of Impairment: Following commands, Safety/judgement, Problem solving Orientation Level: Disoriented to, Time Current Attention Level: Focused Memory: Decreased recall of precautions, Decreased short-term memory Following Commands: Follows one step commands with increased time Safety/Judgement: Decreased awareness of safety, Decreased awareness of deficits Awareness: Emergent Problem Solving: Slow processing, Decreased initiation, Difficulty sequencing, Requires verbal cues, Requires tactile cues General  Comments: Pt with random comments. Concerned about getting his car home.   Blood pressure 151/70, pulse 65,  temperature 97.7 F (36.5 C), temperature source Oral, resp. rate 16, height 5\' 11"  (1.803 m), weight 89.6 kg (197 lb 8.5 oz), SpO2 100 %. Physical Exam  Nursing note and vitals reviewed. Constitutional: He appears well-developed and well-nourished.  HENT:  Head: Normocephalic and atraumatic.  Small incision on scalp with a couple of sutures and staples--underling bogginess noted. Tender to touch.    Eyes: Conjunctivae are normal. Pupils are equal, round, and reactive to light.  Neck: Neck supple.  Limited ROM laterally and flexion limited due to pain.   Cardiovascular: Normal rate and regular rhythm.   Respiratory: Effort normal and breath sounds normal. No stridor. No respiratory distress. He has no wheezes.  GI: Soft. Bowel sounds are normal. He exhibits no distension. There is no tenderness.  Musculoskeletal: He exhibits no edema.  No tenderness in extremeties  Neurological: He is alert.  Tends to fall asleep easily when not engaged.   A&Ox2.  Some confusion Distracted and lacks insight and awareness of deficits. He was able to follow one and occasional two step motor commands.  Pain with SLR bilaterally.  Motor: B/l UE 4/5 B/l hip flexion 4-/5, ankle dorsi/plantar flexion 4+/5  Skin: Skin is warm and dry.  Psychiatric: Cognition and memory are impaired. He expresses inappropriate judgment. He is inattentive.    Results for orders placed or performed during the hospital encounter of 06/10/15 (from the past 48 hour(s))  Glucose, capillary     Status: Abnormal   Collection Time: 06/25/15  4:49 PM  Result Value Ref Range   Glucose-Capillary 139 (H) 65 - 99 mg/dL   Comment 1 Notify RN    Comment 2 Document in Chart   Glucose, capillary     Status: Abnormal   Collection Time: 06/25/15 10:50 PM  Result Value Ref Range   Glucose-Capillary 172 (H) 65 - 99 mg/dL   Comment 1 Notify RN    Comment 2 Document in Chart   Glucose, capillary     Status: Abnormal   Collection Time:  06/26/15  7:08 AM  Result Value Ref Range   Glucose-Capillary 125 (H) 65 - 99 mg/dL   Comment 1 Notify RN    Comment 2 Document in Chart   Glucose, capillary     Status: Abnormal   Collection Time: 06/26/15 11:59 AM  Result Value Ref Range   Glucose-Capillary 138 (H) 65 - 99 mg/dL   Comment 1 Notify RN    Comment 2 Document in Chart   Glucose, capillary     Status: Abnormal   Collection Time: 06/26/15  4:33 PM  Result Value Ref Range   Glucose-Capillary 143 (H) 65 - 99 mg/dL   Comment 1 Notify RN    Comment 2 Document in Chart   Glucose, capillary     Status: Abnormal   Collection Time: 06/26/15 11:09 PM  Result Value Ref Range   Glucose-Capillary 128 (H) 65 - 99 mg/dL   Comment 1 Notify RN    Comment 2 Document in Chart   Glucose, capillary     Status: Abnormal   Collection Time: 06/27/15  6:50 AM  Result Value Ref Range   Glucose-Capillary 121 (H) 65 - 99 mg/dL   Comment 1 Notify RN    Comment 2 Document in Chart   Glucose, capillary     Status: Abnormal   Collection Time: 06/27/15 11:30 AM  Result Value Ref Range   Glucose-Capillary 127 (H) 65 - 99 mg/dL   No results found.   Medical Problem List and Plan: 1.  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  2.  DVT Prophylaxis/Anticoagulation: Mechanical: Sequential compression devices, below knee Bilateral lower extremities Will check dopplers as complaining of BLE/thigh pain.  3. Pain Management: Hydrocodone providing minimal relief. Will try oxycodone prn for headaches and back pain.  4. Mood: LCSW to follow for evaluation and support.  5. Neuropsych: This patient is not fully capable of making decisions on his own behalf. 6. Skin/Wound Care: routine pressure relief measures.  7. Fluids/Electrolytes/Nutrition: Has been poor in part due to lethargy. Will add nutritional supplements. Monitor for now. Check lytes in am.  8. HTN: Monitor BP  tid and resume low dose Cozaar. Titrate medications as indicated  9. DM type 2: Monitor BS ac/hs basis. Po intake has been variable due to lethargy and cognitive issues. Wife encouraged to bring pureed foods from home. Will continue to hold metformin and use SSI for now.  10. Seizures disorder: Was on lamictal bid prior to admission. On  keppra bid additionally now. . Change keppra to po route. 11. ABLA: continue iron supplement. Check lytes in am. 12. Mild ileus:  Has resolved with resolution of abdominal pain and distension. He has been refusing amitiza as well as doses of sennokot.  Will modify and simplify bowel regimen to two Senna S bid. Use enema prn.  13. Hypokalemia: Supplement to avoid recurrence of ileus.  14. Leucocytosis: with complaints of back pain. Will check UA/UCS as has elevated white count.  15.  Chronic right knee pain: Off NSAIDs. Will resume Voltaren gel.  16. PVCs: Negative cardiac work up 05/2015 17. Safety: Continue enclosure bed for fall prevention.    Post Admission Physician Evaluation: 1. 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 . 2. Patient is admitted to receive collaborative, interdisciplinary care between the physiatrist, rehab nursing staff, and therapy team. 3. Patient's level of medical complexity and substantial therapy needs in context of that medical necessity cannot be provided at a lesser intensity of care such as a SNF. 4. Patient has experienced substantial functional loss from his/her baseline which was documented above under the "Functional History" and "Functional Status" headings.  Judging by the patient's diagnosis, physical exam, and functional history, the patient has potential for functional progress which will result in measurable gains while on inpatient rehab.  These gains will be of substantial and practical use upon discharge  in facilitating mobility and self-care at the household level. 5. Physiatrist  will provide 24 hour management of medical needs as well as oversight of the therapy plan/treatment and provide guidance as appropriate regarding the interaction of the two. 6. 24 hour rehab nursing will assist with and help integrate therapy concepts, techniques,education, etc. 7. PT will assess and treat for/with: Lower extremity strength, range of motion, stamina, balance, functional mobility, safety, adaptive techniques and equipment, woundcare, coping skills, pain control, stroke education.   Goals are: Supervision/Min A. 8. OT will assess and treat for/with: ADL's, functional mobility, safety, upper extremity strength, adaptive techniques and equipment, wound mgt, ego support, and community reintegration.   Goals are: Mod I/supervision. Therapy may not proceed with showering this patient. 9. SLP will assess and treat for/with: cognition, speech, swallowing, higher level processing.  Goals are: Min A/Supervision. 10. Case Management and Social Worker will assess  and treat for psychological issues and discharge planning. 11. Team conference will be held weekly to assess progress toward goals and to determine barriers to discharge. 12. Patient will receive at least 3 hours of therapy per day at least 5 days per week. 13. ELOS: 16-19 days.       14. Prognosis:  excellent  Maryla Morrow, MD 06/27/2015

## 2015-06-27 NOTE — Progress Notes (Signed)
Patient information reviewed and entered into eRehab system by Aqua Denslow, RN, CRRN, PPS Coordinator.  Information including medical coding and functional independence measure will be reviewed and updated through discharge.    

## 2015-06-27 NOTE — Progress Notes (Signed)
Weldon Picking Rehab Admission Coordinator Signed Physical Medicine and Rehabilitation PMR Pre-admission 06/27/2015 11:41 AM  Related encounter: ED to Hosp-Admission (Current) from 06/10/2015 in [facility]M NEURO MEDICAL    Expand All Collapse All   PMR Admission Coordinator Pre-Admission Assessment  Patient: Darryl [name] is an 65 y.o., male MRN: [id number] DOB: 19-Nov-1950 Height: 5\' 11"  (180.3 cm) Weight: 89.6 kg (197 lb 8.5 oz)  Insurance Information HMO: PPO: PCP: IPA: 80/20: OTHER: Blue Options PRIMARY: BCBS Policy#: [id number] Subscriber: self CM Name: Carolan Clines Phone#: [phone] Fax#: [phone] Pre-Cert#: [id number], approved for 06/27/15-07/04/15 with clinical updates due after team conference on 07/04/15 Employer: [employer] Benefits: Phone #: [phone], obtained online Name:  Eff. Date: 06/03/15 Deduct: $1250 Out of Pocket Max: $4350 Life Max: n/a CIR: $450 copay then 80%/20% SNF: 80%/20% Outpatient: 80% Co-Pay: 20% Home Health: 80% Co-Pay: 20% DME: 80% Co-Pay: 20% Providers: In network SECONDARY: n/a Policy#: Subscriber:  CM Name: Phone#: Fax#:  Pre-Cert#: Employer:  Benefits: Phone #: Name:  Eff. 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 male with history fo HTN, DM type 2, seizures who was seen in ED 06/10/15 with severe HA but CT head without acute changes. Late that day he had worsening of HA followed by N/V later with unresponsiveness. Patient intubated and sedated in ED. 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 He was evaluated by Dr. Wynetta Emery and ventriculostomy placed for management of hydrocephalus. 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 He tolerated extubation on 01/10 without difficulty and has been monitored closely for vasospasms. Headaches have improvedd but patient with balance deficits, back pain, poor safety awareness requiring restraints, fluctuating mental status with confusion and disorientation. EVD drain removed 01/19 as CT head showing improvement. He is showing improvement in mentation and participation but noted to have wet voice with abdominal distension and pain. On 06/25/15, pt. With noted deeper respiratory secretions and distended abdomen, thought to be related to small ileus. Pt. Had deep suctioning and was given mag citrate with resultant bowel movements. CIR recommended by MD and rehab team.  Total: 1 NIH    Past Medical History  Past Medical History  Diagnosis Date  . Hypertension   . Seizures (HCC)   . Diabetes mellitus without complication (HCC)     Family History  family history includes Alzheimer's disease in his mother; Aneurysm in his brother and sister; Diabetes in his father.  Prior Rehab/Hospitalizations:  Has the patient had major surgery during 100 days prior to admission? No; pt. Being prepped by  anesthesiologist recently for knee surgery but surgery abruptly cancelled due to decreased heart rate per wife.   Current Medications   Current facility-administered medications:  . 0.9 % sodium chloride infusion, , Intravenous, Continuous, Karl Ito, MD, Last Rate: 100 mL/hr at 06/27/15 0654,  100 mL/hr at 06/27/15 0654 . acetaminophen (TYLENOL) tablet 650 mg, 650 mg, Oral, Q4H PRN, 650 mg at 06/19/15 0229 **OR** acetaminophen (TYLENOL) suppository 650 mg, 650 mg, Rectal, Q4H PRN, Donalee Citrin, MD . antiseptic oral rinse (CPC / CETYLPYRIDINIUM CHLORIDE 0.05%) solution 7 mL, 7 mL, Mouth Rinse, BID, Donalee Citrin, MD, 7 mL at 06/27/15 1012 . dexamethasone (DECADRON) injection 1 mg, 1 mg, Intravenous, Q12H, Lisbeth Renshaw, MD, 1 mg at 06/27/15 1013 . feeding supplement (ENSURE ENLIVE) (ENSURE ENLIVE) liquid 237 mL, 237 mL, Oral, BID BM, Heather C Pitts, RD, 237 mL at 06/27/15 1014 . ferrous fumarate-b12-vitamic C-folic acid (TRINSICON / FOLTRIN) capsule 1 capsule, 1 capsule, Oral, BID, Donalee Citrin, MD, 1 capsule at 06/26/15 2153 . hydrALAZINE (APRESOLINE) injection 10-40 mg, 10-40 mg, Intravenous, Q4H PRN, Orville Govern, MD, 20 mg at 06/20/15 0510 . HYDROcodone-acetaminophen (NORCO/VICODIN) 5-325 MG per tablet 2 tablet, 2 tablet, Oral, Q4H PRN, Nelda Bucks, MD, 2 tablet at 06/27/15 1014 . insulin aspart (novoLOG) injection 0-15 Units, 0-15 Units, Subcutaneous, TID WC, Coralyn Helling, MD, 2 Units at 06/27/15 0654 . insulin aspart (novoLOG) injection 0-5 Units, 0-5 Units, Subcutaneous, QHS, Coralyn Helling, MD, 0 Units at 06/14/15 2200 . labetalol (NORMODYNE,TRANDATE) injection 10-40 mg, 10-40 mg, Intravenous, Q10 min PRN, Donalee Citrin, MD, 20 mg at 06/21/15 0308 . lamoTRIgine (LAMICTAL) tablet 100 mg, 100 mg, Oral, BID, Donalee Citrin, MD, 100 mg at 06/27/15 1015 . levETIRAcetam (KEPPRA) 500 mg in sodium chloride 0.9 % 100 mL IVPB, 500 mg, Intravenous, Q12H, Tia Alert, MD, 500 mg at  06/27/15 0247 . lubiprostone (AMITIZA) capsule 24 mcg, 24 mcg, Oral, BID WC, Alyson Reedy, MD, Stopped at 06/26/15 [phone] . morphine 2 MG/ML injection 1-2 mg, 1-2 mg, Intravenous, Q2H PRN, Tia Alert, MD, 2 mg at 06/25/15 2238 . naloxegol oxalate (MOVANTIK) tablet 25 mg, 25 mg, Oral, Daily, Lisbeth Renshaw, MD, 25 mg at 06/26/15 1032 . ondansetron (ZOFRAN) injection 4 mg, 4 mg, Intravenous, Q6H PRN, Hilda Lias, MD, 4 mg at 06/17/15 0018 . pantoprazole (PROTONIX) EC tablet 40 mg, 40 mg, Oral, Q1200, Coralyn Helling, MD, 40 mg at 06/26/15 1206 . RESOURCE THICKENUP CLEAR, , Oral, PRN, Donalee Citrin, MD . senna-docusate (Senokot-S) tablet 1 tablet, 1 tablet, Oral, BID, Donalee Citrin, MD, 1 tablet at 06/27/15 1016 . simvastatin (ZOCOR) tablet 20 mg, 20 mg, Oral, q1800, Nelda Bucks, MD, 20 mg at 06/26/15 1710 . Vitamin D (Ergocalciferol) (DRISDOL) capsule 50,000 Units, 50,000 Units, Oral, Q Sun, Donalee Citrin, MD, 50,000 Units at 06/17/15 1011  Patients Current Diet: DIET DYS 2 Room service appropriate?: Yes; Fluid consistency:: Nectar Thick  Precautions / Restrictions Precautions Precautions: Fall Precaution Comments: vail bed Restrictions Weight Bearing Restrictions: No   Has the patient had 2 or more falls or a fall with injury in the past year?No  Prior Activity Level Community (5-7x/wk): Pt. 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 She also reports pt. 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   Home Assistive Devices / Equipment Home Assistive Devices/Equipment: CBG Meter, Blood pressure cuff, Cane (specify quad or straight) Home Equipment: Walker - 2 wheels, Cane - single point, Crutches  Prior Device Use: Indicate devices/aids used by the patient prior to current illness, exacerbation or injury? None of the above No device used PTA  Prior Functional  Level Prior Function  Level of Independence: Independent 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  Self Care: Did the patient need help bathing, dressing, using the toilet or eating? Independent  Indoor Mobility: Did the patient need assistance with walking from room to room (with or without device)? Independent  Stairs: Did the patient need assistance with internal or external stairs (with or without device)? Independent  Functional Cognition: Did the patient need help planning regular tasks such as shopping or remembering to take medications? Independent  Current Functional Level Cognition  Arousal/Alertness: Awake/alert Overall Cognitive Status: Impaired/Different from baseline Current Attention Level: Focused Orientation Level: Oriented to person, Disoriented to time, Oriented to place, Oriented to situation Following Commands: Follows one step commands with increased time Safety/Judgement: Decreased awareness of safety, Decreased awareness of deficits General Comments: Pt with random comments. Concerned about getting his car home. Attention: Focused Focused Attention: Appears intact Memory: Impaired Memory Impairment: Storage deficit, Retrieval deficit Awareness: Impaired Awareness Impairment: Intellectual impairment Problem Solving: Impaired Problem Solving Impairment: Verbal basic Behaviors: Confabulation, Restless, Impulsive Safety/Judgment: Impaired   Extremity Assessment (includes Sensation/Coordination)  Upper Extremity Assessment: Defer to OT evaluation  Lower Extremity Assessment: Generalized weakness LLE Deficits / Details: Moving L LE, but groslly weaker than R LE. Sensation intact. LLE Coordination: decreased fine motor, decreased gross motor    ADLs  Overall ADL's : Needs assistance/impaired Eating/Feeding: Moderate assistance, Bed level Eating/Feeding Details (indicate cue type and reason): positioned x 2 upright in bed for swallowing Grooming:  Wash/dry face, Bed level, Set up, Supervision/safety Grooming Details (indicate cue type and reason): decreased thoroughness Upper Body Bathing: Maximal assistance, Sitting Lower Body Bathing: Total assistance, Sit to/from stand Upper Body Dressing : Bed level, Maximal assistance Upper Body Dressing Details (indicate cue type and reason): changed soiled gown Lower Body Dressing: Total assistance, Sit to/from stand Toilet Transfer: Total assistance, +2 for physical assistance, Stand-pivot, BSC Toileting- Clothing Manipulation and Hygiene: Total assistance (with mod A +2 sit<>stand)    Mobility  Overal bed mobility: Needs Assistance, +2 for physical assistance Bed Mobility: Supine to Sit, Sit to Supine Supine to sit: Mod assist Sit to supine: Min assist General bed mobility comments: Pt reaching hand out for assist to pull up to sitting.     Transfers  Overall transfer level: Needs assistance Equipment used: 2 person hand held assist Transfers: Sit to/from Stand Sit to Stand: Mod assist, +2 physical assistance Stand pivot transfers: Mod assist, +2 physical assistance General transfer comment: On standing from 3n1 pt used RW to help himself, He stood about 1 minute with c/o leg pain    Ambulation / Gait / Stairs / Engineer, drilling / Balance Dynamic Sitting Balance Sitting balance - Comments: reliant on at least 1 UE, he will controlled posterior lean to lay back on bed Balance Overall balance assessment: Needs assistance Sitting-balance support: Feet supported, Single extremity supported Sitting balance-Leahy Scale: Poor Sitting balance - Comments: reliant on at least 1 UE, he will controlled posterior lean to lay back on bed Postural control: Right lateral lean Standing balance support: Bilateral upper extremity supported, During functional activity Standing balance-Leahy Scale: Poor Standing balance comment: Reliant on RW and therapists    Special  needs/care consideration BiPAP/CPAP no CPM no Continuous Drip IV 0.9 NaCl at 100 ml/hr. Dialysis no  Life Vest no Oxygen no Special Bed Yes, Vail bed Trach Size no Wound Vac (area) Location Skin Per RN Stephen on 06/27/15, pt with healed R groin incision; 2 staples  and 2 sutures on top of head Location Bowel mgmt: last BM 06/26/15 has had stool incontinence per Bennetta Laos Bladder mgmt: Urinary incontinence Diabetic mgmt yes     Previous Home Environment Living Arrangements: Spouse/significant other Lives With: Spouse Available Help at Discharge: Family, Available 24 hours/day Type of Home: House Home Layout: Two level, 1/2 bath on main level Alternate Level Stairs-Rails: Left Alternate Level Stairs-Number of Steps: flight Home Access: Stairs to enter Entrance Stairs-Rails: Right, Left Entrance Stairs-Number of Steps: 6 Bathroom Shower/Tub: Health visitor: Handicapped height Bathroom Accessibility: Yes Home Care Services: No  Discharge Living Setting Plans for Discharge Living Setting: Patient's home Type of Home at Discharge: House Discharge Home Layout: Two level, 1/2 bath on main level, Bed/bath upstairs Alternate Level Stairs-Rails: Left Alternate Level Stairs-Number of Steps: flight Discharge Home Access: Stairs to enter Entrance Stairs-Rails: Right, Left Entrance Stairs-Number of Steps: 6 Discharge Bathroom Shower/Tub: Walk-in shower Discharge Bathroom Toilet: Handicapped height Discharge Bathroom Accessibility: Yes How Accessible: Accessible via walker Does the patient have any problems obtaining your medications?: No  Social/Family/Support Systems Patient Roles: Spouse, Parent (grandparent of 7; parent of 4) Anticipated Caregiver: Ayinde Swim, wife Anticipated Caregiver's Contact Information: cell [phone]; home [phone] Ability/Limitations of Caregiver: Less [place] statess she has  fibromyalgia and will not be able to do any physical assistance of her patient Caregiver Availability: 24/7 Discharge Plan Discussed with Primary Caregiver: Yes Is Caregiver In Agreement with Plan?: Yes Does Caregiver/Family have Issues with Lodging/Transportation while Pt is in Rehab?: No   Goals/Additional Needs Patient/Family Goal for Rehab: modified independent and supervision PT/OT; supervision SLP Expected length of stay: 12-18 days Cultural Considerations: "Baptist" ; wife states his faith is very important to him Dietary Needs: dysphagia 2, nectar thick liquids, no straws, feed slowly Equipment Needs: TBA Special Service Needs: currently in a vail bed for safety Pt/Family Agrees to Admission and willing to participate: Yes Program Orientation Provided & Reviewed with Pt/Caregiver Including Roles & Responsibilities: Yes   Decrease burden of Care through IP rehab admission: n/a   Possible need for SNF placement upon discharge: Not anticipated   Patient Condition: This patient's medical and functional status has changed since the consult dated: 06/22/15 in which the Rehabilitation Physician determined and documented that the patient's condition is appropriate for intensive rehabilitative care in an inpatient rehabilitation facility. See "History of Present Illness" (above) for medical update. Functional changes are: +2 mod assist RW transfers, poor sitting balance, +2 total assist for toilet transfer, dysphagia 1 diet with nectar thick liquids . Patient's medical and functional status update has been discussed with the Rehabilitation physician and patient remains appropriate for inpatient rehabilitation. Will admit to inpatient rehab today.  Preadmission Screen Completed By: Weldon Picking, 06/27/2015 12:19 PM ______________________________________________________________________  Discussed status with Dr. Allena Katz on 06/27/15 at 1219 and received telephone approval for  admission today.  Admission Coordinator: Weldon Picking, time 1219 Dorna Bloom 06/27/15          Cosigned by: Ankit Karis Juba, MD at 06/27/2015 12:24 PM  Revision History     Date/Time User Provider Type Action   06/27/2015 12:24 PM Ankit Karis Juba, MD Physician Cosign   06/27/2015 12:20 PM Weldon Picking Rehab Admission Coordinator Sign          Weldon Picking Rehab Admission Coordinator Signed Physical Medicine and Rehabilitation PMR Pre-admission 06/27/2015 11:41 AM  Related encounter: ED to Hosp-Admission (Current) from 06/10/2015 in [facility] 74M NEURO MEDICAL    Expand All  Collapse All   PMR Admission Coordinator Pre-Admission Assessment  Patient: Darryl [name] is an 65 y.o., male MRN: [id number] DOB: 02/02/1951 Height:  (180.3 cm) Weight: 89.6 kg (197 lb 8.5 oz)  Insurance Information HMO: PPO: PCP: IPA: 80/20: OTHER: Blue Options PRIMARY: BCBS Policy#: [id number] Subscriber: self CM Name: Carolan Clines Phone#: [phone] Fax#: [phone] Pre-Cert#: [id number], approved for 06/27/15-07/04/15 with clinical updates due after team conference on 07/04/15 Employer: [employer] Benefits: Phone #: [phone], obtained online Name:  Eff. Date: 06/03/15 Deduct: $1250 Out of Pocket Max: $4350 Life Max: n/a CIR: $450 copay then 80%/20% SNF: 80%/20% Outpatient: 80% Co-Pay: 20% Home Health: 80% Co-Pay: 20% DME: 80% Co-Pay: 20% Providers: In network SECONDARY: n/a Policy#: Subscriber:  CM Name: Phone#: Fax#:  Pre-Cert#: Employer:  Benefits: Phone #: Name:  Eff. 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 male with history fo HTN, DM type 2, seizures who was seen in ED 06/10/15 with severe HA but CT head without acute changes. Late that day he had worsening of HA followed by N/V later with unresponsiveness. Patient intubated and sedated in ED. 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 He was evaluated by Dr. Wynetta Emery and ventriculostomy placed for management of hydrocephalus. 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 He tolerated extubation on 01/10 without difficulty and has been monitored closely for vasospasms. Headaches have improvedd but patient with balance deficits, back pain, poor safety awareness requiring restraints, fluctuating mental status with confusion and disorientation. EVD drain removed 01/19 as CT head showing improvement. He is showing improvement in mentation and participation but noted to have wet voice with abdominal distension and pain. On 06/25/15, pt. With noted deeper respiratory secretions and distended abdomen, thought to be related to small ileus. Pt. Had deep suctioning and was given mag citrate with resultant bowel movements. CIR recommended by MD and rehab team.  Total: 1 NIH    Past Medical History  Past Medical History  Diagnosis  Date  . Hypertension   . Seizures (HCC)   . Diabetes mellitus without complication (HCC)     Family History  family history includes Alzheimer's disease in his mother; Aneurysm in his brother and sister; Diabetes in his father.  Prior Rehab/Hospitalizations:  Has the patient had major surgery during 100 days prior to admission? No; pt. Being prepped by anesthesiologist recently for knee surgery but surgery abruptly cancelled due to decreased heart rate per wife.   Current Medications   Current facility-administered medications:  . 0.9 % sodium chloride infusion, , Intravenous, Continuous, Karl Ito, MD, Last Rate: 100 mL/hr at 06/27/15 0654, 100 mL/hr at 06/27/15 0654 . acetaminophen (TYLENOL) tablet 650 mg, 650 mg, Oral, Q4H PRN, 650 mg at 06/19/15 0229 **OR** acetaminophen (TYLENOL) suppository 650 mg, 650 mg, Rectal, Q4H PRN, Donalee Citrin, MD . antiseptic  oral rinse (CPC / CETYLPYRIDINIUM CHLORIDE 0.05%) solution 7 mL, 7 mL, Mouth Rinse, BID, Donalee Citrin, MD, 7 mL at 06/27/15 1012 . dexamethasone (DECADRON) injection 1 mg, 1 mg, Intravenous, Q12H, Lisbeth Renshaw, MD, 1 mg at 06/27/15 1013 . feeding supplement (ENSURE ENLIVE) (ENSURE ENLIVE) liquid 237 mL, 237 mL, Oral, BID BM, Heather C Pitts, RD, 237 mL at 06/27/15 1014 . ferrous fumarate-b12-vitamic C-folic acid (TRINSICON / FOLTRIN) capsule 1 capsule, 1 capsule, Oral, BID, Donalee Citrin, MD, 1 capsule at 06/26/15 2153 . hydrALAZINE (APRESOLINE) injection 10-40 mg, 10-40 mg, Intravenous, Q4H PRN, Orville Govern, MD, 20 mg at 06/20/15 0510 . HYDROcodone-acetaminophen (NORCO/VICODIN) 5-325 MG per tablet 2 tablet, 2 tablet, Oral, Q4H PRN, Nelda Bucks, MD, 2 tablet at 06/27/15 1014 . insulin aspart (novoLOG) injection 0-15 Units, 0-15 Units, Subcutaneous, TID WC, Coralyn Helling, MD, 2 Units at 06/27/15 0654 . insulin aspart (novoLOG) injection 0-5 Units, 0-5 Units, Subcutaneous, QHS, Coralyn Helling, MD, 0  Units at 06/14/15 2200 . labetalol (NORMODYNE,TRANDATE) injection 10-40 mg, 10-40 mg, Intravenous, Q10 min PRN, Donalee Citrin, MD, 20 mg at 06/21/15 0308 . lamoTRIgine (LAMICTAL) tablet 100 mg, 100 mg, Oral, BID, Donalee Citrin, MD, 100 mg at 06/27/15 1015 . levETIRAcetam (KEPPRA) 500 mg in sodium chloride 0.9 % 100 mL IVPB, 500 mg, Intravenous, Q12H, Tia Alert, MD, 500 mg at 06/27/15 0247 . lubiprostone (AMITIZA) capsule 24 mcg, 24 mcg, Oral, BID WC, Alyson Reedy, MD, Stopped at 06/26/15 [phone] . morphine 2 MG/ML injection 1-2 mg, 1-2 mg, Intravenous, Q2H PRN, Tia Alert, MD, 2 mg at 06/25/15 2238 . naloxegol oxalate (MOVANTIK) tablet 25 mg, 25 mg, Oral, Daily, Lisbeth Renshaw, MD, 25 mg at 06/26/15 1032 . ondansetron (ZOFRAN) injection 4 mg, 4 mg, Intravenous, Q6H PRN, Hilda Lias, MD, 4 mg at 06/17/15 0018 . pantoprazole (PROTONIX) EC tablet 40 mg, 40 mg, Oral, Q1200, Coralyn Helling, MD, 40 mg at 06/26/15 1206 . RESOURCE THICKENUP CLEAR, , Oral, PRN, Donalee Citrin, MD . senna-docusate (Senokot-S) tablet 1 tablet, 1 tablet, Oral, BID, Donalee Citrin, MD, 1 tablet at 06/27/15 1016 . simvastatin (ZOCOR) tablet 20 mg, 20 mg, Oral, q1800, Nelda Bucks, MD, 20 mg at 06/26/15 1710 . Vitamin D (Ergocalciferol) (DRISDOL) capsule 50,000 Units, 50,000 Units, Oral, Q Sun, Donalee Citrin, MD, 50,000 Units at 06/17/15 1011  Patients Current Diet: DIET DYS 2 Room service appropriate?: Yes; Fluid consistency:: Nectar Thick  Precautions / Restrictions Precautions Precautions: Fall Precaution Comments: vail bed Restrictions Weight Bearing Restrictions: No   Has the patient had 2 or more falls or a fall with injury in the past year?No  Prior Activity Level Community (5-7x/wk): Pt. 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 She also reports pt. 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   Home Assistive Devices / Equipment Home Assistive Devices/Equipment: CBG Meter, Blood pressure cuff, Cane (specify quad or straight) Home Equipment: Walker - 2 wheels, Cane - single point, Crutches  Prior Device Use: Indicate devices/aids used by the patient prior to current illness, exacerbation or injury? None of the above No device used PTA  Prior Functional Level Prior Function Level of Independence: Independent 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  Self Care: Did the patient need help bathing, dressing, using the toilet or eating? Independent  Indoor Mobility: Did the patient need assistance with walking from  room to room (with or without device)? Independent  Stairs: Did the patient need assistance with internal or external stairs (with or without device)? Independent  Functional Cognition: Did the patient need help planning regular tasks such as shopping or remembering to take medications? Independent  Current Functional Level Cognition  Arousal/Alertness: Awake/alert Overall Cognitive Status: Impaired/Different from baseline Current Attention Level: Focused Orientation Level: Oriented to person, Disoriented to time, Oriented to place, Oriented to situation Following Commands: Follows one step commands with increased time Safety/Judgement: Decreased awareness of safety, Decreased awareness of deficits General Comments: Pt with random comments. Concerned about getting his car home. Attention: Focused Focused Attention: Appears intact Memory: Impaired Memory Impairment: Storage deficit, Retrieval deficit Awareness: Impaired Awareness Impairment: Intellectual impairment Problem Solving: Impaired Problem Solving Impairment: Verbal basic Behaviors: Confabulation, Restless, Impulsive Safety/Judgment: Impaired   Extremity Assessment (includes Sensation/Coordination)  Upper Extremity Assessment: Defer to OT evaluation  Lower Extremity Assessment:  Generalized weakness LLE Deficits / Details: Moving L LE, but groslly weaker than R LE. Sensation intact. LLE Coordination: decreased fine motor, decreased gross motor    ADLs  Overall ADL's : Needs assistance/impaired Eating/Feeding: Moderate assistance, Bed level Eating/Feeding Details (indicate cue type and reason): positioned x 2 upright in bed for swallowing Grooming: Wash/dry face, Bed level, Set up, Supervision/safety Grooming Details (indicate cue type and reason): decreased thoroughness Upper Body Bathing: Maximal assistance, Sitting Lower Body Bathing: Total assistance, Sit to/from stand Upper Body Dressing : Bed level, Maximal assistance Upper Body Dressing Details (indicate cue type and reason): changed soiled gown Lower Body Dressing: Total assistance, Sit to/from stand Toilet Transfer: Total assistance, +2 for physical assistance, Stand-pivot, BSC Toileting- Clothing Manipulation and Hygiene: Total assistance (with mod A +2 sit<>stand)    Mobility  Overal bed mobility: Needs Assistance, +2 for physical assistance Bed Mobility: Supine to Sit, Sit to Supine Supine to sit: Mod assist Sit to supine: Min assist General bed mobility comments: Pt reaching hand out for assist to pull up to sitting.     Transfers  Overall transfer level: Needs assistance Equipment used: 2 person hand held assist Transfers: Sit to/from Stand Sit to Stand: Mod assist, +2 physical assistance Stand pivot transfers: Mod assist, +2 physical assistance General transfer comment: On standing from 3n1 pt used RW to help himself, He stood about 1 minute with c/o leg pain    Ambulation / Gait / Stairs / Engineer, drilling / Balance Dynamic Sitting Balance Sitting balance - Comments: reliant on at least 1 UE, he will controlled posterior lean to lay back on bed Balance Overall balance assessment: Needs assistance Sitting-balance support: Feet supported, Single extremity  supported Sitting balance-Leahy Scale: Poor Sitting balance - Comments: reliant on at least 1 UE, he will controlled posterior lean to lay back on bed Postural control: Right lateral lean Standing balance support: Bilateral upper extremity supported, During functional activity Standing balance-Leahy Scale: Poor Standing balance comment: Reliant on RW and therapists    Special needs/care consideration BiPAP/CPAP no CPM no Continuous Drip IV 0.9 NaCl at 100 ml/hr. Dialysis no  Life Vest no Oxygen no Special Bed Yes, Vail bed Trach Size no Wound Vac (area) Location Skin Per RN Jeannett Senior on 06/27/15, pt with healed R groin incision; 2 staples and 2 sutures on top of head Location Bowel mgmt: last BM 06/26/15 has had stool incontinence per Bennetta Laos Bladder mgmt: Urinary incontinence Diabetic mgmt yes     Previous Home Environment Living Arrangements: Spouse/significant  other Lives With: Spouse Available Help at Discharge: Family, Available 24 hours/day Type of Home: House Home Layout: Two level, 1/2 bath on main level Alternate Level Stairs-Rails: Left Alternate Level Stairs-Number of Steps: flight Home Access: Stairs to enter Entrance Stairs-Rails: Right, Left Entrance Stairs-Number of Steps: 6 Bathroom Shower/Tub: Health visitor: Handicapped height Bathroom Accessibility: Yes Home Care Services: No  Discharge Living Setting Plans for Discharge Living Setting: Patient's home Type of Home at Discharge: House Discharge Home Layout: Two level, 1/2 bath on main level, Bed/bath upstairs Alternate Level Stairs-Rails: Left Alternate Level Stairs-Number of Steps: flight Discharge Home Access: Stairs to enter Entrance Stairs-Rails: Right, Left Entrance Stairs-Number of Steps: 6 Discharge Bathroom Shower/Tub: Walk-in shower Discharge Bathroom Toilet: Handicapped height Discharge Bathroom Accessibility: Yes How  Accessible: Accessible via walker Does the patient have any problems obtaining your medications?: No  Social/Family/Support Systems Patient Roles: Spouse, Parent (grandparent of 7; parent of 4) Anticipated Caregiver: Jerimie Mancuso, wife Anticipated Caregiver's Contact Information: cell [phone]; home [phone] Ability/Limitations of Caregiver: Shahiem Bedwell statess she has fibromyalgia and will not be able to do any physical assistance of her patient Caregiver Availability: 24/7 Discharge Plan Discussed with Primary Caregiver: Yes Is Caregiver In Agreement with Plan?: Yes Does Caregiver/Family have Issues with Lodging/Transportation while Pt is in Rehab?: No   Goals/Additional Needs Patient/Family Goal for Rehab: modified independent and supervision PT/OT; supervision SLP Expected length of stay: 12-18 days Cultural Considerations: "Baptist" ; wife states his faith is very important to him Dietary Needs: dysphagia 2, nectar thick liquids, no straws, feed slowly Equipment Needs: TBA Special Service Needs: currently in a vail bed for safety Pt/Family Agrees to Admission and willing to participate: Yes Program Orientation Provided & Reviewed with Pt/Caregiver Including Roles & Responsibilities: Yes   Decrease burden of Care through IP rehab admission: n/a   Possible need for SNF placement upon discharge: Not anticipated   Patient Condition: This patient's medical and functional status has changed since the consult dated: 06/22/15 in which the Rehabilitation Physician determined and documented that the patient's condition is appropriate for intensive rehabilitative care in an inpatient rehabilitation facility. See "History of Present Illness" (above) for medical update. Functional changes are: +2 mod assist RW transfers, poor sitting balance, +2 total assist for toilet transfer, dysphagia 1 diet with nectar thick liquids . Patient's medical and functional status update has been  discussed with the Rehabilitation physician and patient remains appropriate for inpatient rehabilitation. Will admit to inpatient rehab today.  Preadmission Screen Completed By: Weldon Picking, 06/27/2015 12:19 PM ______________________________________________________________________  Discussed status with Dr. Allena Katz on 06/27/15 at 1219 and received telephone approval for admission today.  Admission Coordinator: Weldon Picking, time 1219 Dorna Bloom 06/27/15          Cosigned by: Ankit Karis Juba, MD at 06/27/2015 12:24 PM  Revision History     Date/Time User Provider Type Action   06/27/2015 12:24 PM Ankit Karis Juba, MD Physician Cosign   06/27/2015 12:20 PM Weldon Picking Rehab Admission Coordinator Sign          Weldon Picking Rehab Admission Coordinator Signed Physical Medicine and Rehabilitation PMR Pre-admission 06/27/2015 11:41 AM  Related encounter: ED to Hosp-Admission (Current) from 06/10/2015 in [facility] 10M NEURO MEDICAL    Expand All Collapse All   PMR Admission Coordinator Pre-Admission Assessment  Patient: Darryl [name] is an 65 y.o., male MRN: [id number] DOB: Sep 16, 1950 Height: 5\' 11"  (180.3 cm) Weight: 89.6 kg (197 lb 8.5 oz)  Insurance Information  HMO: PPO: PCP: IPA: 80/20: OTHER: Blue Options PRIMARY: BCBS Policy#: [id number] Subscriber: self CM Name: Carolan Clines Phone#: [phone] Fax#: [phone] Pre-Cert#: [id number], approved for 06/27/15-07/04/15 with clinical updates due after team conference on 07/04/15 Employer: [employer] Benefits: Phone #: [phone], obtained online Name:  Eff. Date: 06/03/15 Deduct: $1250 Out of Pocket Max: $4350 Life Max: n/a CIR: $450 copay then 80%/20% SNF:  80%/20% Outpatient: 80% Co-Pay: 20% Home Health: 80% Co-Pay: 20% DME: 80% Co-Pay: 20% Providers: In network SECONDARY: n/a Policy#: Subscriber:  CM Name: Phone#: Fax#:  Pre-Cert#: Employer:  Benefits: Phone #: Name:  Eff. 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 male with history fo HTN, DM type 2, seizures who was seen in ED 06/10/15 with severe HA but CT head without acute changes. Late that day he had worsening of HA followed by N/V later with unresponsiveness. Patient intubated and sedated in ED. 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 He was evaluated by Dr. Wynetta Emery and ventriculostomy placed for management of hydrocephalus. 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 He tolerated extubation on 01/10 without difficulty and has been monitored closely for vasospasms. Headaches have improvedd but patient with balance deficits, back pain, poor safety awareness requiring restraints, fluctuating mental status with confusion and disorientation. EVD drain removed 01/19 as CT head showing improvement. He is showing improvement in mentation and  participation but noted to have wet voice with abdominal distension and pain. On 06/25/15, pt. With noted deeper respiratory secretions and distended abdomen, thought to be related to small ileus. Pt. Had deep suctioning and was given mag citrate with resultant bowel movements. CIR recommended by MD and rehab team.  Total: 1 NIH    Past Medical History  Past Medical History  Diagnosis Date  . Hypertension   . Seizures (HCC)   . Diabetes mellitus without complication (HCC)     Family History  family history includes Alzheimer's disease in his mother; Aneurysm in his brother and sister; Diabetes in his father.  Prior Rehab/Hospitalizations:  Has the patient had major surgery during 100 days prior to admission? No; pt. Being prepped by anesthesiologist recently for knee surgery but surgery abruptly cancelled due to decreased heart rate per wife.   Current Medications   Current facility-administered medications:  . 0.9 % sodium chloride infusion, , Intravenous, Continuous, Karl Ito, MD, Last Rate: 100 mL/hr at 06/27/15 0654, 100 mL/hr at 06/27/15 0654 . acetaminophen (TYLENOL) tablet 650 mg, 650 mg, Oral, Q4H PRN, 650 mg at 06/19/15 0229 **OR** acetaminophen (TYLENOL) suppository 650 mg, 650 mg, Rectal, Q4H PRN, Donalee Citrin, MD . antiseptic oral rinse (CPC / CETYLPYRIDINIUM CHLORIDE 0.05%) solution 7 mL, 7 mL, Mouth Rinse, BID, Donalee Citrin, MD, 7 mL at 06/27/15 1012 . dexamethasone (DECADRON) injection 1 mg, 1 mg, Intravenous, Q12H, Lisbeth Renshaw, MD, 1  mg at 06/27/15 1013 . feeding supplement (ENSURE ENLIVE) (ENSURE ENLIVE) liquid 237 mL, 237 mL, Oral, BID BM, Heather C Pitts, RD, 237 mL at 06/27/15 1014 . ferrous fumarate-b12-vitamic C-folic acid (TRINSICON / FOLTRIN) capsule 1 capsule, 1 capsule, Oral, BID, Donalee Citrin, MD, 1 capsule at 06/26/15 2153 . hydrALAZINE (APRESOLINE) injection 10-40 mg, 10-40 mg, Intravenous, Q4H PRN, Orville Govern, MD, 20 mg  at 06/20/15 0510 . HYDROcodone-acetaminophen (NORCO/VICODIN) 5-325 MG per tablet 2 tablet, 2 tablet, Oral, Q4H PRN, Nelda Bucks, MD, 2 tablet at 06/27/15 1014 . insulin aspart (novoLOG) injection 0-15 Units, 0-15 Units, Subcutaneous, TID WC, Coralyn Helling, MD, 2 Units at 06/27/15 0654 . insulin aspart (novoLOG) injection 0-5 Units, 0-5 Units, Subcutaneous, QHS, Coralyn Helling, MD, 0 Units at 06/14/15 2200 . labetalol (NORMODYNE,TRANDATE) injection 10-40 mg, 10-40 mg, Intravenous, Q10 min PRN, Donalee Citrin, MD, 20 mg at 06/21/15 0308 . lamoTRIgine (LAMICTAL) tablet 100 mg, 100 mg, Oral, BID, Donalee Citrin, MD, 100 mg at 06/27/15 1015 . levETIRAcetam (KEPPRA) 500 mg in sodium chloride 0.9 % 100 mL IVPB, 500 mg, Intravenous, Q12H, Tia Alert, MD, 500 mg at 06/27/15 0247 . lubiprostone (AMITIZA) capsule 24 mcg, 24 mcg, Oral, BID WC, Alyson Reedy, MD, Stopped at 06/26/15 [phone] . morphine 2 MG/ML injection 1-2 mg, 1-2 mg, Intravenous, Q2H PRN, Tia Alert, MD, 2 mg at 06/25/15 2238 . naloxegol oxalate (MOVANTIK) tablet 25 mg, 25 mg, Oral, Daily, Lisbeth Renshaw, MD, 25 mg at 06/26/15 1032 . ondansetron (ZOFRAN) injection 4 mg, 4 mg, Intravenous, Q6H PRN, Hilda Lias, MD, 4 mg at 06/17/15 0018 . pantoprazole (PROTONIX) EC tablet 40 mg, 40 mg, Oral, Q1200, Coralyn Helling, MD, 40 mg at 06/26/15 1206 . RESOURCE THICKENUP CLEAR, , Oral, PRN, Donalee Citrin, MD . senna-docusate (Senokot-S) tablet 1 tablet, 1 tablet, Oral, BID, Donalee Citrin, MD, 1 tablet at 06/27/15 1016 . simvastatin (ZOCOR) tablet 20 mg, 20 mg, Oral, q1800, Nelda Bucks, MD, 20 mg at 06/26/15 1710 . Vitamin D (Ergocalciferol) (DRISDOL) capsule 50,000 Units, 50,000 Units, Oral, Q Sun, Donalee Citrin, MD, 50,000 Units at 06/17/15 1011  Patients Current Diet: DIET DYS 2 Room service appropriate?: Yes; Fluid consistency:: Nectar Thick  Precautions / Restrictions Precautions Precautions: Fall Precaution Comments: vail  bed Restrictions Weight Bearing Restrictions: No   Has the patient had 2 or more falls or a fall with injury in the past year?No  Prior Activity Level Community (5-7x/wk): Pt. 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 She also reports pt. 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   Home Assistive Devices / Equipment Home Assistive Devices/Equipment: CBG Meter, Blood pressure cuff, Cane (specify quad or straight) Home Equipment: Walker - 2 wheels, Cane - single point, Crutches  Prior Device Use: Indicate devices/aids used by the patient prior to current illness, exacerbation or injury? None of the above No device used PTA  Prior Functional Level Prior Function Level of Independence: Independent 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  Self Care: Did the patient need help bathing, dressing, using the toilet or eating? Independent  Indoor Mobility: Did the patient need assistance with walking from room to room (with or without device)? Independent  Stairs: Did the patient need assistance with internal or external stairs (with or without device)? Independent  Functional Cognition: Did the patient need help planning regular tasks such  as shopping or remembering to take medications? Independent  Current Functional Level Cognition  Arousal/Alertness: Awake/alert Overall Cognitive Status: Impaired/Different from baseline Current Attention Level: Focused Orientation Level: Oriented to person, Disoriented to time, Oriented to place, Oriented to situation Following Commands: Follows one step commands with increased time Safety/Judgement: Decreased awareness of safety, Decreased awareness of deficits General Comments: Pt with random comments. Concerned about getting his car home. Attention: Focused Focused Attention: Appears intact Memory: Impaired Memory  Impairment: Storage deficit, Retrieval deficit Awareness: Impaired Awareness Impairment: Intellectual impairment Problem Solving: Impaired Problem Solving Impairment: Verbal basic Behaviors: Confabulation, Restless, Impulsive Safety/Judgment: Impaired   Extremity Assessment (includes Sensation/Coordination)  Upper Extremity Assessment: Defer to OT evaluation  Lower Extremity Assessment: Generalized weakness LLE Deficits / Details: Moving L LE, but groslly weaker than R LE. Sensation intact. LLE Coordination: decreased fine motor, decreased gross motor    ADLs  Overall ADL's : Needs assistance/impaired Eating/Feeding: Moderate assistance, Bed level Eating/Feeding Details (indicate cue type and reason): positioned x 2 upright in bed for swallowing Grooming: Wash/dry face, Bed level, Set up, Supervision/safety Grooming Details (indicate cue type and reason): decreased thoroughness Upper Body Bathing: Maximal assistance, Sitting Lower Body Bathing: Total assistance, Sit to/from stand Upper Body Dressing : Bed level, Maximal assistance Upper Body Dressing Details (indicate cue type and reason): changed soiled gown Lower Body Dressing: Total assistance, Sit to/from stand Toilet Transfer: Total assistance, +2 for physical assistance, Stand-pivot, BSC Toileting- Clothing Manipulation and Hygiene: Total assistance (with mod A +2 sit<>stand)    Mobility  Overal bed mobility: Needs Assistance, +2 for physical assistance Bed Mobility: Supine to Sit, Sit to Supine Supine to sit: Mod assist Sit to supine: Min assist General bed mobility comments: Pt reaching hand out for assist to pull up to sitting.     Transfers  Overall transfer level: Needs assistance Equipment used: 2 person hand held assist Transfers: Sit to/from Stand Sit to Stand: Mod assist, +2 physical assistance Stand pivot transfers: Mod assist, +2 physical assistance General transfer comment: On standing from  3n1 pt used RW to help himself, He stood about 1 minute with c/o leg pain    Ambulation / Gait / Stairs / Engineer, drilling / Balance Dynamic Sitting Balance Sitting balance - Comments: reliant on at least 1 UE, he will controlled posterior lean to lay back on bed Balance Overall balance assessment: Needs assistance Sitting-balance support: Feet supported, Single extremity supported Sitting balance-Leahy Scale: Poor Sitting balance - Comments: reliant on at least 1 UE, he will controlled posterior lean to lay back on bed Postural control: Right lateral lean Standing balance support: Bilateral upper extremity supported, During functional activity Standing balance-Leahy Scale: Poor Standing balance comment: Reliant on RW and therapists    Special needs/care consideration BiPAP/CPAP no CPM no Continuous Drip IV 0.9 NaCl at 100 ml/hr. Dialysis no  Life Vest no Oxygen no Special Bed Yes, V[place] bed Trach Size no Wound Vac (area) Location Skin Per RN Jeannett Senior on 06/27/15, pt with healed R groin incision; 2 staples and 2 sutures on top of head Location Bowel mgmt: last BM 06/26/15 has had stool incontinence per Bennetta Laos Bladder mgmt: Urinary incontinence Diabetic mgmt yes     Previous Home Environment Living Arrangements: Spouse/significant other Lives With: Spouse Available Help at Discharge: Family, Available 24 hours/day Type of Home: House Home Layout: Two level, 1/2 bath on main level Alternate Level Stairs-Rails: Left Alternate Level Stairs-Number of Steps: flight Home Access:  Stairs to enter Entrance Stairs-Rails: Right, Left Entrance Stairs-Number of Steps: 6 Bathroom Shower/Tub: Health visitor: Handicapped height Bathroom Accessibility: Yes Home Care Services: No  Discharge Living Setting Plans for Discharge Living Setting: Patient's home Type of Home at Discharge:  House Discharge Home Layout: Two level, 1/2 bath on main level, Bed/bath upstairs Alternate Level Stairs-Rails: Left Alternate Level Stairs-Number of Steps: flight Discharge Home Access: Stairs to enter Entrance Stairs-Rails: Right, Left Entrance Stairs-Number of Steps: 6 Discharge Bathroom Shower/Tub: Walk-in shower Discharge Bathroom Toilet: Handicapped height Discharge Bathroom Accessibility: Yes How Accessible: Accessible via walker Does the patient have any problems obtaining your medications?: No  Social/Family/Support Systems Patient Roles: Spouse, Parent (grandparent of 7; parent of 4) Anticipated Caregiver: Florian Chauca, wife Anticipated Caregiver's Contact Information: cell [phone]; home [phone] Ability/Limitations of Caregiver: Trevone Prestwood statess she has fibromyalgia and will not be able to do any physical assistance of her patient Caregiver Availability: 24/7 Discharge Plan Discussed with Primary Caregiver: Yes Is Caregiver In Agreement with Plan?: Yes Does Caregiver/Family have Issues with Lodging/Transportation while Pt is in Rehab?: No   Goals/Additional Needs Patient/Family Goal for Rehab: modified independent and supervision PT/OT; supervision SLP Expected length of stay: 12-18 days Cultural Considerations: "Baptist" ; wife states his faith is very important to him Dietary Needs: dysphagia 2, nectar thick liquids, no straws, feed slowly Equipment Needs: TBA Special Service Needs: currently in a vail bed for safety Pt/Family Agrees to Admission and willing to participate: Yes Program Orientation Provided & Reviewed with Pt/Caregiver Including Roles & Responsibilities: Yes   Decrease burden of Care through IP rehab admission: n/a   Possible need for SNF placement upon discharge: Not anticipated   Patient Condition: This patient's medical and functional status has changed since the consult dated: 06/22/15 in which the Rehabilitation Physician  determined and documented that the patient's condition is appropriate for intensive rehabilitative care in an inpatient rehabilitation facility. See "History of Present Illness" (above) for medical update. Functional changes are: +2 mod assist RW transfers, poor sitting balance, +2 total assist for toilet transfer, dysphagia 1 diet with nectar thick liquids . Patient's medical and functional status update has been discussed with the Rehabilitation physician and patient remains appropriate for inpatient rehabilitation. Will admit to inpatient rehab today.  Preadmission Screen Completed By: Weldon Picking, 06/27/2015 12:19 PM ______________________________________________________________________  Discussed status with Dr. Allena Katz on 06/27/15 at 1219 and received telephone approval for admission today.  Admission Coordinator: Weldon Picking, time 1219 Dorna Bloom 06/27/15          Cosigned by: Ankit Karis Juba, MD at 06/27/2015 12:24 PM  Revision History     Date/Time User Provider Type Action   06/27/2015 12:24 PM Ankit Karis Juba, MD Physician Cosign   06/27/2015 12:20 PM Weldon Picking Rehab Admission Coordinator Sign     Weldon Picking Rehab Admission Coordinator Signed Physical Medicine and Rehabilitation PMR Pre-admission 06/27/2015 11:41 AM  Related encounter: ED to Hosp-Admission (Current) from 06/10/2015 in [facility] 29M NEURO MEDICAL    Expand All Collapse All   PMR Admission Coordinator Pre-Admission Assessment  Patient: Darryl [name] is an 65 y.o., male MRN: [id number] DOB: [birth year] Height: 5\' 11"  (180.3 cm) Weight: 89.6 kg (197 lb 8.5 oz)  Insurance Information HMO: PPO: PCP: IPA: 80/20: OTHER: Blue Options PRIMARY: BCBS Policy#: [id number] Subscriber: self CM  Name: Carolan Clines Phone#: [phone] Fax#: [phone] Pre-Cert#: [id number], approved for 06/27/15-07/04/15 with clinical updates due after team conference on 07/04/15 Employer: [employer]  Benefits: Phone #: [phone], obtained online Name:  Eff. Date: 06/03/15 Deduct: $1250 Out of Pocket Max: $4350 Life Max: n/a CIR: $450 copay then 80%/20% SNF: 80%/20% Outpatient: 80% Co-Pay: 20% Home Health: 80% Co-Pay: 20% DME: 80% Co-Pay: 20% Providers: In network SECONDARY: n/a Policy#: Subscriber:  CM Name: Phone#: Fax#:  Pre-Cert#: Employer:  Benefits: Phone #: Name:  Eff. 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 male with history fo HTN, DM type 2, seizures who was seen in ED 06/10/15 with severe HA but CT head without acute changes. Late that day he had worsening of HA followed by N/V later with unresponsiveness. Patient intubated and sedated in ED. 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 He was evaluated by Dr. Wynetta Emery and ventriculostomy placed for management of hydrocephalus. 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 Patient underwent cerebral angio with coiling of fusiform right V4 aneurysm with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

## 2015-06-27 NOTE — Progress Notes (Signed)
Inpatient Rehabilitation  I have received updated insurance approval from [facility] to admit pt. to CIR today.  Per RN Jeannett Senior, Dr. Conchita Paris has cleared pt. for admission today and I note   DC summary and DC orders in chart.  I have updated RNCM and CSW of the plan. Pt's wife is in agreement and pleased that he will be coming to CIR.  Please call if questions.  Weldon Picking PT Inpatient Rehab Admissions Coordinator Cell [phone] Office [phone]

## 2015-06-27 NOTE — PMR Pre-admission (Signed)
PMR Admission Coordinator Pre-Admission Assessment  Patient: Darryl [name] is an 65 y.o., male MRN: [id number] DOB: 08-21-1950 Height: 5\' 11"  (180.3 cm) Weight: 89.6 kg (197 lb 8.5 oz)              Insurance Information HMO:    PPO:      PCP:      IPA:      80/20:      OTHER: [employer] PRIMARY:  BCBS      Policy#:  [id number]      Subscriber:  self CM Name:  Carolan Clines      Phone#: [phone]     Fax#:  [phone] Pre-Cert#:  [id number], approved for 06/27/15-07/04/15 with clinical updates due after team conference on 07/04/15      Employer: [employer]  [employer] Benefits:  Phone #:  [phone], obtained online     Name:   Eff. Date: 06/03/15     Deduct:  $1250      Out of Pocket Max:  $4350      Life Max:  n/a CIR:  $450 copay then 80%/20%      SNF:  80%/20% Outpatient:  80%     Co-Pay:  20% Home Health:  80%       Co-Pay:  20% DME:  80%     Co-Pay:  20% Providers:  In network SECONDARY:  n/a      Policy#:       Subscriber:  CM Name:       Phone#:      Fax#:  Pre-Cert#:       Employer:  Benefits:  Phone #:      Name:  Eff. 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 male with history fo HTN, DM type 2, seizures who was seen in ED 06/10/15 with severe HA but CT head without acute changes. Late that day he had worsening of HA followed by N/V later with unresponsiveness. Patient intubated and sedated in ED. 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 He was evaluated by Dr. Wynetta Emery and ventriculostomy placed for management of hydrocephalus. 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 He tolerated extubation on 01/10 without difficulty and has been monitored closely for vasospasms. Headaches have improvedd but patient with balance deficits, back pain, poor safety awareness requiring restraints, fluctuating mental status with confusion and disorientation. EVD drain removed 01/19 as CT head showing improvement. He is showing improvement in mentation and participation but noted to have wet voice with abdominal distension and pain.  On 06/25/15, pt. With noted deeper respiratory secretions and distended abdomen, thought to be related to small ileus.  Pt. Had deep suctioning and was given mag citrate with resultant bowel movements.  CIR recommended by MD and rehab team.  Total: 1 NIH    Past Medical History  Past Medical History  Diagnosis Date  . Hypertension   . Seizures (HCC)   . Diabetes mellitus without complication (HCC)     Family History  family history includes Alzheimer's disease in his mother; Aneurysm in his brother and sister; Diabetes in his father.  Prior Rehab/Hospitalizations:  Has the patient had major surgery during 100 days prior to admission? No; pt. Being prepped by anesthesiologist recently for knee surgery but surgery abruptly cancelled due to decreased heart rate per wife.    Current Medications   Current facility-administered medications:  .  0.9 %  sodium chloride infusion, , Intravenous, Continuous, Karl Ito, MD, Last Rate: 100 mL/hr at 06/27/15 0654, 100 mL/hr at 06/27/15 0654 .  acetaminophen (TYLENOL) tablet 650 mg, 650 mg, Oral, Q4H PRN, 650 mg at 06/19/15 0229 **OR** acetaminophen  (TYLENOL) suppository 650 mg, 650 mg, Rectal, Q4H PRN, Donalee Citrin, MD .  antiseptic oral rinse (CPC / CETYLPYRIDINIUM CHLORIDE 0.05%) solution 7 mL, 7 mL, Mouth Rinse, BID, Donalee Citrin, MD, 7 mL at 06/27/15 1012 .  dexamethasone (DECADRON) injection 1 mg, 1 mg, Intravenous, Q12H, Lisbeth Renshaw, MD, 1 mg at 06/27/15 1013 .  feeding supplement (ENSURE ENLIVE) (ENSURE ENLIVE) liquid 237 mL, 237 mL, Oral, BID BM, Heather C Pitts, RD, 237 mL at 06/27/15 1014 .  ferrous fumarate-b12-vitamic C-folic acid (TRINSICON / FOLTRIN) capsule 1 capsule, 1 capsule, Oral, BID, Donalee Citrin, MD, 1 capsule at 06/26/15 2153 .  hydrALAZINE (APRESOLINE) injection 10-40 mg, 10-40 mg, Intravenous, Q4H PRN, Orville Govern, MD, 20 mg at 06/20/15 0510 .  HYDROcodone-acetaminophen (NORCO/VICODIN) 5-325 MG per tablet 2 tablet, 2 tablet, Oral, Q4H PRN, Nelda Bucks, MD, 2 tablet at 06/27/15 1014 .  insulin aspart (novoLOG) injection 0-15 Units, 0-15 Units, Subcutaneous, TID WC, Coralyn Helling, MD, 2 Units at 06/27/15 0654 .  insulin aspart (novoLOG) injection 0-5 Units, 0-5 Units, Subcutaneous, QHS, Coralyn Helling, MD, 0 Units at 06/14/15 2200 .  labetalol (NORMODYNE,TRANDATE) injection 10-40 mg, 10-40 mg, Intravenous, Q10 min PRN, Donalee Citrin, MD, 20 mg at 06/21/15 0308 .  lamoTRIgine (LAMICTAL) tablet 100 mg, 100 mg, Oral, BID, Donalee Citrin, MD, 100 mg at 06/27/15 1015 .  levETIRAcetam (KEPPRA) 500 mg in sodium chloride 0.9 % 100 mL IVPB, 500 mg, Intravenous, Q12H, Tia Alert, MD, 500 mg at 06/27/15 0247 .  lubiprostone (AMITIZA) capsule 24 mcg, 24 mcg, Oral, BID WC, Alyson Reedy, MD, Stopped at 06/26/15 [phone] .  morphine 2 MG/ML injection 1-2 mg, 1-2 mg, Intravenous, Q2H PRN, Tia Alert, MD, 2 mg at 06/25/15 2238 .  naloxegol oxalate (MOVANTIK) tablet 25 mg, 25 mg, Oral, Daily, Lisbeth Renshaw, MD, 25 mg at 06/26/15 1032 .  ondansetron (ZOFRAN) injection 4 mg, 4 mg, Intravenous, Q6H PRN, Hilda Lias, MD, 4 mg at  06/17/15 0018 .  pantoprazole (PROTONIX) EC tablet 40 mg, 40 mg, Oral, Q1200, Coralyn Helling, MD, 40 mg at 06/26/15 1206 .  RESOURCE THICKENUP CLEAR, , Oral, PRN, Donalee Citrin, MD .  senna-docusate (Senokot-S) tablet 1 tablet, 1 tablet, Oral, BID, Donalee Citrin, MD, 1 tablet at 06/27/15 1016 .  simvastatin (ZOCOR) tablet 20 mg, 20 mg, Oral, q1800, Nelda Bucks, MD,  20 mg at 06/26/15 1710 .  Vitamin D (Ergocalciferol) (DRISDOL) capsule 50,000 Units, 50,000 Units, Oral, Q Sun, Donalee Citrin, MD, 50,000 Units at 06/17/15 1011  Patients Current Diet: DIET DYS 2 Room service appropriate?: Yes; Fluid consistency:: Nectar Thick  Precautions / Restrictions Precautions Precautions: Fall Precaution Comments: vail bed Restrictions Weight Bearing Restrictions: No   Has the patient had 2 or more falls or a fall with injury in the past year?No  Prior Activity Level Community (5-7x/wk): Pt. 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  She also reports pt. 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    Home Assistive Devices / Equipment Home Assistive Devices/Equipment: CBG Meter, Blood pressure cuff, Cane (specify quad or straight) Home Equipment: Walker - 2 wheels, Cane - single point, Crutches  Prior Device Use: Indicate devices/aids used by the patient prior to current illness, exacerbation or injury? None of the above  No device used PTA  Prior Functional Level Prior Function Level of Independence: Independent 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  Self Care: Did the patient need help bathing, dressing, using the toilet or eating?  Independent  Indoor Mobility: Did the patient need assistance with walking from room to room (with or without device)? Independent  Stairs: Did the patient need assistance with internal or external stairs (with or without device)?  Independent  Functional Cognition: Did the patient need help planning regular tasks such as shopping or remembering to take medications? Independent  Current Functional Level Cognition  Arousal/Alertness: Awake/alert Overall Cognitive Status: Impaired/Different from baseline Current Attention Level: Focused Orientation Level: Oriented to person, Disoriented to time, Oriented to place, Oriented to situation Following Commands: Follows one step commands with increased time Safety/Judgement: Decreased awareness of safety, Decreased awareness of deficits General Comments: Pt with random comments. Concerned about getting his car home. Attention: Focused Focused Attention: Appears intact Memory: Impaired Memory Impairment: Storage deficit, Retrieval deficit Awareness: Impaired Awareness Impairment: Intellectual impairment Problem Solving: Impaired Problem Solving Impairment: Verbal basic Behaviors: Confabulation, Restless, Impulsive Safety/Judgment: Impaired    Extremity Assessment (includes Sensation/Coordination)  Upper Extremity Assessment: Defer to OT evaluation  Lower Extremity Assessment: Generalized weakness LLE Deficits / Details: Moving L LE, but groslly weaker than R LE.  Sensation intact. LLE Coordination: decreased fine motor, decreased gross motor    ADLs  Overall ADL's : Needs assistance/impaired Eating/Feeding: Moderate assistance, Bed level Eating/Feeding Details (indicate cue type and reason): positioned x 2 upright in bed for swallowing Grooming: Wash/dry face, Bed level, Set up, Supervision/safety Grooming Details (indicate cue type and reason): decreased thoroughness Upper Body Bathing: Maximal assistance, Sitting Lower Body Bathing: Total assistance, Sit to/from stand Upper Body Dressing : Bed level, Maximal assistance Upper Body Dressing Details (indicate cue type and reason): changed soiled gown Lower Body Dressing: Total assistance, Sit to/from stand Toilet  Transfer: Total assistance, +2 for physical assistance, Stand-pivot, BSC Toileting- Clothing Manipulation and Hygiene: Total assistance (with mod A +2 sit<>stand)    Mobility  Overal bed mobility: Needs Assistance, +2 for physical assistance Bed Mobility: Supine to Sit, Sit to Supine Supine to sit: Mod assist Sit to supine: Min assist General bed mobility comments: Pt reaching hand out for assist to pull up to sitting.     Transfers  Overall transfer level: Needs assistance Equipment used: 2 person hand held assist Transfers: Sit to/from Stand Sit  to Stand: Mod assist, +2 physical assistance Stand pivot transfers: Mod assist, +2 physical assistance General transfer comment: On standing from 3n1 pt used RW to help himself, He stood about 1 minute with c/o leg pain    Ambulation / Gait / Stairs / Engineer, drilling / Balance Dynamic Sitting Balance Sitting balance - Comments: reliant on at least 1 UE, he will controlled posterior lean to lay back on bed Balance Overall balance assessment: Needs assistance Sitting-balance support: Feet supported, Single extremity supported Sitting balance-Leahy Scale: Poor Sitting balance - Comments: reliant on at least 1 UE, he will controlled posterior lean to lay back on bed Postural control: Right lateral lean Standing balance support: Bilateral upper extremity supported, During functional activity Standing balance-Leahy Scale: Poor Standing balance comment: Reliant on RW and therapists    Special needs/care consideration BiPAP/CPAP  no CPM  no Continuous Drip IV  0.9 NaCl at 100 ml/hr. Dialysis no        Life Vest   no Oxygen   no Special Bed   Yes, Vail bed Trach Size no Wound Vac (area)      Location Skin   Per RN Stephen on 06/27/15, pt with healed R groin incision; 2 staples and 2 sutures on top of head                           Location Bowel mgmt: last BM 06/26/15 has had stool incontinence per Bennetta Laos Bladder mgmt:   Urinary incontinence Diabetic mgmt yes     Previous Home Environment Living Arrangements: Spouse/significant other  Lives With: Spouse Available Help at Discharge: Family, Available 24 hours/day Type of Home: House Home Layout: Two level, 1/2 bath on main level Alternate Level Stairs-Rails: Left Alternate Level Stairs-Number of Steps: flight Home Access: Stairs to enter Entrance Stairs-Rails: Right, Left Entrance Stairs-Number of Steps: 6 Bathroom Shower/Tub: Health visitor: Handicapped height Bathroom Accessibility: Yes Home Care Services: No  Discharge Living Setting Plans for Discharge Living Setting: Patient's home Type of Home at Discharge: House Discharge Home Layout: Two level, 1/2 bath on main level, Bed/bath upstairs Alternate Level Stairs-Rails: Left Alternate Level Stairs-Number of Steps: flight Discharge Home Access: Stairs to enter Entrance Stairs-Rails: Right, Left Entrance Stairs-Number of Steps: 6 Discharge Bathroom Shower/Tub: Walk-in shower Discharge Bathroom Toilet: Handicapped height Discharge Bathroom Accessibility: Yes How Accessible: Accessible via walker Does the patient have any problems obtaining your medications?: No  Social/Family/Support Systems Patient Roles: Spouse, Parent (grandparent of 7; parent of 4) Anticipated Caregiver: Windle Huebert, wife Anticipated Caregiver's Contact Information: cell [phone]; home [phone] Ability/Limitations of Caregiver: Donyae Kilner statess she has fibromyalgia and will not be able to do any physical assistance of her patient Caregiver Availability: 24/7 Discharge Plan Discussed with Primary Caregiver: Yes Is Caregiver In Agreement with Plan?: Yes Does Caregiver/Family have Issues with Lodging/Transportation while Pt is in Rehab?: No   Goals/Additional Needs Patient/Family Goal for Rehab: modified independent and supervision PT/OT; supervision SLP Expected length of stay: 12-18  days Cultural Considerations: "Baptist" ; wife states his faith is very important to him Dietary Needs: dysphagia 2, nectar thick liquids, no straws, feed slowly Equipment Needs: TBA Special Service Needs: currently in a vail bed for safety Pt/Family Agrees to Admission and willing to participate: Yes Program Orientation Provided & Reviewed with Pt/Caregiver Including Roles  & Responsibilities: Yes   Decrease burden of Care through IP rehab admission: n/a  Possible need for SNF placement upon discharge:  Not anticipated   Patient Condition: This patient's medical and functional status has changed since the consult dated: 06/22/15  in which the Rehabilitation Physician determined and documented that the patient's condition is appropriate for intensive rehabilitative care in an inpatient rehabilitation facility. See "History of Present Illness" (above) for medical update. Functional changes are:  +2 mod assist RW transfers, poor sitting balance, +2 total assist for toilet transfer, dysphagia 1 diet with nectar thick liquids . Patient's medical and functional status update has been discussed with the Rehabilitation physician and patient remains appropriate for inpatient rehabilitation. Will admit to inpatient rehab today.  Preadmission Screen Completed By:  Weldon Picking, 06/27/2015 12:19 PM ______________________________________________________________________   Discussed status with Dr.  Allena Katz on 06/27/15 at  1219  and received telephone approval for admission today.  Admission Coordinator:  Weldon Picking, time 1219 Dorna Bloom 06/27/15

## 2015-06-27 NOTE — Discharge Summary (Signed)
Physician Discharge Summary  Patient ID: Darryl [name] MRN: [id number] DOB/AGE: 1950-10-27 65 y.o.  Admit date: 06/10/2015 Discharge date: 06/27/2015  Admission Diagnoses: Subarachnoid hemorrhage  Discharge Diagnoses: Same Active Problems:   Subarachnoid hemorrhage (HCC)   SAH (subarachnoid hemorrhage) (HCC)   Emesis   Abdominal discomfort   Discharged Condition: Stable  Hospital Course:  Mrs. Darryl [name] is a 65 y.o. male admitted after presenting with sudden HA. CT demonstrated SAH, and he underwent angiogram and coil sacrifice of a distal RVA aneurysm. He was monitored closely in the ICU without any evidence of clinical vasospasm. He did have stable, mild right-sided weakness. His ventriculostomy was weaned and discontinued. He was transferred to the floor. Evaluation by PT/OT and PMR found him to be a good candidate for CIR. He did have a mild ileus which resolved.  Treatments: Surgery - Angiogram, coiling of RVA aneurysm  Discharge Exam: Blood pressure 151/70, pulse 65, temperature 97.7 F (36.5 C), temperature source Oral, resp. rate 16, height  (1.803 m), weight 89.6 kg (197 lb 8.5 oz), SpO2 100 %. Awake, alert, oriented Speech fluent, appropriate CN grossly intact 5/5 LUE/LLE 4/5 RUE/RLE Wound c/d/i  Disposition: CIR     Medication List    TAKE these medications        dexamethasone 4 MG/ML injection  Commonly known as:  DECADRON  Inject 0.25 mLs (1 mg total) into the vein every 12 (twelve) hours.     diclofenac sodium 1 % Gel  Commonly known as:  VOLTAREN  Apply 1 application topically 2 (two) times daily as needed (pain).     etodolac 400 MG tablet  Commonly known as:  LODINE  Take 400 mg by mouth 2 (two) times daily.     HYDROcodone-acetaminophen 5-325 MG tablet  Commonly known as:  NORCO/VICODIN  Take 2 tablets by mouth every 4 (four) hours as needed for moderate pain.     ibuprofen 200 MG tablet  Commonly known as:  ADVIL,MOTRIN  Take 800  mg by mouth 2 (two) times daily as needed (pain).     insulin aspart 100 UNIT/ML injection  Commonly known as:  novoLOG  Inject 0-15 Units into the skin 3 (three) times daily with meals.     insulin aspart 100 UNIT/ML injection  Commonly known as:  novoLOG  Inject 0-5 Units into the skin at bedtime.     INTEGRA PLUS Caps  Take 1 capsule by mouth 2 (two) times daily.     lamoTRIgine 100 MG tablet  Commonly known as:  LAMICTAL  Take 100 mg by mouth 2 (two) times daily.     LORazepam 0.5 MG tablet  Commonly known as:  ATIVAN  Take 1 mg by mouth See admin instructions. Take 2 tablets (1 mg) by mouth daily at bedtime, may also take 1 tablet (0.5 mg) in the morning as needed for anxiety     losartan-hydrochlorothiazide 100-25 MG tablet  Commonly known as:  HYZAAR  Take 1 tablet by mouth daily.     metFORMIN 500 MG 24 hr tablet  Commonly known as:  GLUCOPHAGE-XR  Take 1,500 mg by mouth at bedtime.     naloxegol oxalate 25 MG Tabs tablet  Commonly known as:  MOVANTIK  Take 1 tablet (25 mg total) by mouth daily.     naproxen 500 MG tablet  Commonly known as:  NAPROSYN  Take 500 mg by mouth 2 (two) times daily as needed for mild pain.     potassium chloride  SA 20 MEQ tablet  Commonly known as:  K-DUR,KLOR-CON  Take 20 mEq by mouth at bedtime.     Vitamin D (Ergocalciferol) 50000 units Caps capsule  Commonly known as:  DRISDOL  Take 50,000 Units by mouth once a week. On Sundays           Follow-up Information    Follow up with [facility], Alexio Sroka, C, MD In 1 month.   Specialty:  Neurosurgery   Contact information:   [address] [place] [address] [phone]       Signed: Jackelyn Hoehn 06/27/2015, 8:51 AM

## 2015-06-27 NOTE — Progress Notes (Signed)
Darryl Oyster, MD Physician Signed Physical Medicine and Rehabilitation Consult Note 06/22/2015 8:12 AM  Related encounter: ED to Hosp-Admission (Current) from 06/10/2015 in [facility] NEURO MEDICAL    Expand All Collapse All        Physical Medicine and Rehabilitation Consult   Reason for Consult: Poor safety, cognitive deficits,  Referring Physician: Dr. Conchita Paris.    HPI: Darryl [name] is a 65 y.o. male with history fo HTN, DM type 2, seizures who was seen in ED 06/10/15 with severe HA but CT head without acute changes. Late that day he had worsening of HA followed by N/V later with unresponsiveness. Patient intubated and sedated in ED. 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 He was evaluated by Dr. Wynetta Emery and ventriculostomy placed for management of hydrocephalus. 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 He tolerated extubation on 01/10 without difficulty and has been monitored closely for vasospasms. Headaches have improvedd but patient with balance deficits, back pain, poor safety awareness requiring restraints, fluctuating mental status with confusion and disorientation. EVD drain removed yesterday as CT head showing improvement. He was started on liquid diet yesterday and therapy has been ongoing. He is showing improvement in mentation and participation. CIR recommended by MD and rehab team.    Review of Systems  Unable to perform ROS: mental acuity  Respiratory: Positive for cough and shortness of breath.  Cardiovascular: Positive for chest pain and palpitations.  Gastrointestinal: Positive for abdominal pain.  Musculoskeletal: Positive for myalgias, back pain and joint pain.  Neurological: Positive for headaches.  Psychiatric/Behavioral: The patient is  nervous/anxious.      Past Medical History  Diagnosis Date  . Hypertension   . Seizures (HCC)   . Diabetes mellitus without complication [facility])     Past Surgical History  Procedure Laterality Date  . Knee surgery    . Cholecystectomy    . Radiology with anesthesia N/A 06/11/2015    Procedure: RADIOLOGY WITH ANESTHESIA; Surgeon: Lisbeth Renshaw, MD; Location: [facility] OR; Service: Radiology; Laterality: N/A;    Family History  Problem Relation Age of Onset  . Alzheimer's disease Mother   . Diabetes Father   . Diabetes Paternal Grandmother   . Diabetes Paternal Grandfather      Social History:  reports that he quit smoking about 27 years ago. He does not have any smokeless tobacco history on file. He reports that he does not drink alcohol or use illicit drugs.    Allergies: No Known Allergies    Medications Prior to Admission  Medication Sig Dispense Refill  . diclofenac sodium (VOLTAREN) 1 % GEL Apply 1 application topically 2 (two) times daily as needed (pain).    Marland Kitchen etodolac (LODINE) 400 MG tablet Take 400 mg by mouth 2 (two) times daily.  3  . FeFum-FePoly-FA-B Cmp-C-Biot (INTEGRA PLUS) CAPS Take 1 capsule by mouth 2 (two) times daily.     Marland Kitchen ibuprofen (ADVIL,MOTRIN) 200 MG tablet Take 800 mg by mouth 2 (two) times daily as needed (pain).    Marland Kitchen lamoTRIgine (LAMICTAL) 100 MG tablet Take 100 mg by mouth 2 (two) times daily.     Marland Kitchen LORazepam (ATIVAN) 0.5 MG tablet Take 1 mg by mouth See admin instructions. Take 2 tablets (1 mg) by mouth daily at bedtime, may also take 1 tablet (0.5 mg) in  the morning as needed for anxiety    . losartan-hydrochlorothiazide (HYZAAR) 100-25 MG per tablet Take 1 tablet by mouth daily.    . metFORMIN (GLUCOPHAGE-XR) 500 MG 24 hr tablet Take 1,500 mg by mouth at bedtime.  2  . naproxen (NAPROSYN) 500 MG tablet Take 500 mg by mouth 2 (two) times  daily as needed for mild pain.   2  . potassium chloride SA (K-DUR,KLOR-CON) 20 MEQ tablet Take 20 mEq by mouth at bedtime.     . Vitamin D, Ergocalciferol, (DRISDOL) 50000 UNITS CAPS capsule Take 50,000 Units by mouth once a week. On Sundays  5    Home: Home Living Family/patient expects to be discharged to:: Inpatient rehab Living Arrangements: Spouse/significant other Available Help at Discharge: Family, Available 24 hours/day Type of Home: House Home Access: Stairs to enter [employer] of Steps: 6 Entrance Stairs-Rails: Right, Left Home Layout: Two level, 1/2 bath on main level Alternate Level Stairs-Number of Steps: flight Alternate Level Stairs-Rails: Left Bathroom Shower/Tub: Health visitor: Handicapped height Bathroom Accessibility: Yes Home Equipment: Environmental consultant - 2 wheels, Cane - single point, Crutches Lives With: Spouse  Functional History: Prior Function Level of Independence: Independent 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 Functional Status:  Mobility: Bed Mobility Overal bed mobility: Needs Assistance, +2 for physical assistance Bed Mobility: Supine to Sit, Sit to Supine Supine to sit: Max assist, +2 for physical assistance Sit to supine: Max assist, +2 for physical assistance General bed mobility comments: pt needs cues and facilitation for technique, but better participation today.  Transfers Overall transfer level: Needs assistance Equipment used: 2 person hand held assist Transfers: Sit to/from Stand Sit to Stand: Max assist, +2 physical assistance Stand pivot transfers: Mod assist, +2 physical assistance General transfer comment: pt agreeable to attempt standing today and did participate with coming to standing. pt able to stand for ~21mins prior to needing to sit. Bil LEs blocked.       ADL: ADL Overall ADL's : Needs assistance/impaired Eating/Feeding: Minimal assistance, Sitting Grooming: Wash/dry hands,  Wash/dry face, Minimal assistance, Sitting Upper Body Bathing: Maximal assistance, Sitting Lower Body Bathing: Total assistance, Sit to/from stand Upper Body Dressing : Maximal assistance, Sitting Lower Body Dressing: Total assistance, Sit to/from stand Toilet Transfer: +2 for physical assistance, Stand-pivot, BSC, Moderate assistance Toileting- Clothing Manipulation and Hygiene: Total assistance, Sit to/from stand, +2 for physical assistance  Cognition: Cognition Overall Cognitive Status: Impaired/Different from baseline Arousal/Alertness: Awake/alert Orientation Level: Oriented to person, Disoriented to time, Disoriented to situation, Disoriented to place Attention: Focused Focused Attention: Appears intact Memory: Impaired Memory Impairment: Storage deficit, Retrieval deficit Awareness: Impaired Awareness Impairment: Intellectual impairment Problem Solving: Impaired Problem Solving Impairment: Verbal basic Behaviors: Confabulation, Restless, Impulsive Safety/Judgment: Impaired Cognition Arousal/Alertness: Awake/alert Behavior During Therapy: Impulsive Overall Cognitive Status: Impaired/Different from baseline Area of Impairment: Orientation, Attention, Memory, Following commands, Safety/judgement, Awareness, Problem solving Orientation Level: Disoriented to, Place, Time, Situation Current Attention Level: Focused Memory: Decreased recall of precautions, Decreased short-term memory Following Commands: Follows one step commands inconsistently, Follows one step commands with increased time Safety/Judgement: Decreased awareness of safety, Decreased awareness of deficits Awareness: Intellectual Problem Solving: Slow processing, Decreased initiation, Difficulty sequencing, Requires verbal cues, Requires tactile cues General Comments: pt does have some improvement in ability to follow directions today. When pt was told he was in a hospital in [place], he was able to come up with  the name [facility], but pror to PT providing cues [place]   Blood pressure 158/90, pulse 78, temperature 98.2 F (36.8 C), temperature source Axillary, resp. rate 24, height  (1.803 m), weight 89.6 kg (197 lb 8.5 oz), SpO2 100 %. Physical Exam  Nursing note and vitals reviewed. Constitutional: He appears well-developed and well-nourished. He appears lethargic. He is easily aroused. No distress.  Wincing and moaning thorough out the exam.  HENT:  Head: Normocephalic and atraumatic.  Few Staples and sutures noted on right scalp.  Eyes: Conjunctivae are normal. Pupils are equal, round, and reactive to light.  Neck:  Limited  Cardiovascular: Normal rate and regular rhythm.  Respiratory: Effort normal. No tachypnea. He has decreased breath sounds in the right lower field and the left lower field.  GI: He exhibits distension. Bowel sounds are decreased. There is tenderness.  Tender to palpation.  Musculoskeletal:  Back and knee pain with ROM BLE.  Neurological: He is easily aroused. He appears lethargic.  Distracted by pain and hard to redirect. complains of HA and light sensitivity. Oriented to self and place ([facility]) only and was unable to state month, age or DOB. Told me where he lives. Perseverative speech. He was able to follow two step motor commands with cues. Thought processing tangential and disassociated at times. LUE with pronator drift, tremors with persistent use/intentional movements. Strength 4/5 RUE 4-/5 LUE. RLE 3/5 prox to 4/5 distally. LLE: 3- prox to 3/5 distally. Senses pain in all 4's.  Skin: Skin is warm and dry. He is not diaphoretic.  Psychiatric: He is withdrawn. Cognition and memory are impaired. He expresses impulsivity and inappropriate judgment. He is inattentive.     Lab Results Last 24 Hours    Results for orders placed or performed during the hospital encounter of 06/10/15 (from the past 24 hour(s))  Glucose,  capillary Status: Abnormal   Collection Time: 06/21/15 8:38 AM  Result Value Ref Range   Glucose-Capillary 175 (H) 65 - 99 mg/dL  Glucose, capillary Status: Abnormal   Collection Time: 06/21/15 11:19 AM  Result Value Ref Range   Glucose-Capillary 185 (H) 65 - 99 mg/dL   Comment 1 Notify RN    Comment 2 Document in Chart   Urinalysis, Routine w reflex microscopic (not at [facility]) Status: Abnormal   Collection Time: 06/21/15 11:38 AM  Result Value Ref Range   Color, Urine YELLOW YELLOW   APPearance CLEAR CLEAR   Specific Gravity, Urine 1.020 1.005 - 1.030   pH 6.0 5.0 - 8.0   Glucose, UA NEGATIVE NEGATIVE mg/dL   Hgb urine dipstick LARGE (A) NEGATIVE   Bilirubin Urine NEGATIVE NEGATIVE   Ketones, ur NEGATIVE NEGATIVE mg/dL   Protein, ur 045 (A) NEGATIVE mg/dL   Nitrite POSITIVE (A) NEGATIVE   Leukocytes, UA NEGATIVE NEGATIVE  Urine microscopic-add on Status: Abnormal   Collection Time: 06/21/15 11:38 AM  Result Value Ref Range   Squamous Epithelial / LPF 0-5 (A) NONE SEEN   WBC, UA 0-5 0 - 5 WBC/hpf   RBC / HPF 6-30 0 - 5 RBC/hpf   Bacteria, UA FEW (A) NONE SEEN   Casts GRANULAR CAST (A) NEGATIVE  CBC with Differential/Platelet Status: Abnormal   Collection Time: 06/21/15 11:52 AM  Result Value Ref Range   WBC 21.0 (H) 4.0 - 10.5 K/uL   RBC 5.34 4.22 - 5.81 MIL/uL   Hemoglobin 14.4 13.0 - 17.0 g/dL   HCT 40.9 81.1 - 91.4 %   MCV 73.8 (L) 78.0 - 100.0 fL   MCH 27.0 26.0 -  34.0 pg   MCHC 36.5 (H) 30.0 - 36.0 g/dL   RDW 81.1 91.4 - 78.2 %   Platelets 389 150 - 400 K/uL   Neutrophils Relative % 91 %   Lymphocytes Relative 7 %   Monocytes Relative 2 %   Eosinophils Relative 0 %   Basophils Relative 0 %   Neutro Abs 19.1 (H) 1.7 - 7.7 K/uL   Lymphs Abs 1.5 0.7 - 4.0 K/uL   Monocytes  Absolute 0.4 0.1 - 1.0 K/uL   Eosinophils Absolute 0.0 0.0 - 0.7 K/uL   Basophils Absolute 0.0 0.0 - 0.1 K/uL   WBC Morphology TOXIC GRANULATION    Smear Review LARGE PLATELETS PRESENT   Glucose, capillary Status: Abnormal   Collection Time: 06/21/15 3:33 PM  Result Value Ref Range   Glucose-Capillary 192 (H) 65 - 99 mg/dL   Comment 1 Notify RN    Comment 2 Document in Chart   Glucose, capillary Status: Abnormal   Collection Time: 06/21/15 9:54 PM  Result Value Ref Range   Glucose-Capillary 147 (H) 65 - 99 mg/dL      Imaging Results (Last 48 hours)    Ct Head Wo Contrast  06/21/2015 CLINICAL DATA: Follow-up. 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 History of hypertension, seizures, diabetes. EXAM: CT HEAD WITHOUT CONTRAST TECHNIQUE: Contiguous axial images were obtained from the base of the skull through the vertex without intravenous contrast. COMPARISON: CT head June 09, 2014 FINDINGS: Interval coil embolization of RIGHT V4 vertebral artery, which results in streak artifact at the skullbase. Small amount of residual subarachnoid hemorrhage. Trace posterior falcine and cerebellar tentorium subdural hematomas. Status post placement of RIGHT frontal ventriculostomy catheter with distal tip at the level of the foramen of Monro. Mild to moderate ventriculomegaly, predominately on the basis of global parenchymal brain volume loss. Trace dependent redistributed blood products in the occipital horns. Gliosis about the catheter tract. No intraparenchymal hemorrhage, mass effect, midline shift or acute large vascular territory infarcts. Basal cisterns are patent. Fluid-filled empty sella. Ocular globes and orbital contents are unremarkable. RIGHT maxillary mucosal retention cyst, small LEFT sphenoid mucosal retention cyst, LEFT anterior ethmoid mucosal thickening. Mastoid air cells are well aerated. No skull fracture.  RIGHT frontal burr hole. IMPRESSION: Interval coil embolization of RIGHT V4 segment without acute large vessel occlusion nor acute intracranial process. Small amount of residual subarachnoid hemorrhage with trace falcotentorial subdural hematoma. Trace re- distributed intraventricular blood products. RIGHT frontal ventriculostomy catheter terminates at foramen of Monro. No hydrocephalus. Electronically Signed By: Awilda Metro M.D. On: 06/21/2015 04:50     Assessment/Plan: Diagnosis: subarachnoid hemorrhage with hydrocephalus resulting in cognitive and mobility deficits 1. Does the need for close, 24 hr/day medical supervision in concert with the patient's rehab needs make it unreasonable for this patient to be served in a less intensive setting? Yes 2. Co-Morbidities requiring supervision/potential complications: DM, HTN 3. Due to bladder management, bowel management, safety, skin/wound care, disease management, medication administration, pain management and patient education, does the patient require 24 hr/day rehab nursing? Yes 4. Does the patient require coordinated care of a physician, rehab nurse, PT (1-2 hrs/day, 5 days/week), OT (1-2 hrs/day, 5 days/week) and SLP (1-2 hrs/day, 5 days/week) to address physical and functional deficits in the context of the above medical diagnosis(es)? Yes Addressing deficits in the following areas: balance, endurance, locomotion, strength, transferring, bowel/bladder control, bathing, dressing, feeding, grooming, toileting, cognition, speech, language and psychosocial support 5. Can the patient actively participate in an intensive therapy  program of at least 3 hrs of therapy per day at least 5 days per week? Yes 6. The potential for patient to make measurable gains while on inpatient rehab is excellent 7. Anticipated functional outcomes upon discharge from inpatient rehab are modified independent and supervision with PT, modified independent and  supervision with OT, supervision with SLP. 8. Estimated rehab length of stay to reach the above functional goals is: 12-18 days 9. Does the patient have adequate social supports and living environment to accommodate these discharge functional goals? Yes 10. Anticipated D/C setting: Home 11. Anticipated post D/C treatments: HH therapy and Outpatient therapy 12. Overall Rehab/Functional Prognosis: excellent  RECOMMENDATIONS: This patient's condition is appropriate for continued rehabilitative care in the following setting: CIR Patient has agreed to participate in recommended program. Yes Note that insurance prior authorization may be required for reimbursement for recommended care.  Comment: Seems to have supportive family. Rehab Admissions Coordinator to follow up.  Thanks,  Darryl Oyster, MD, [place] [facility]     06/22/2015       Revision History     Date/Time User Provider Type Action   06/22/2015 1:26 PM Darryl Oyster, MD Physician Sign   06/22/2015 8:56 AM Jacquelynn Cree, PA-C Physician Assistant [facility] Details Report       Routing History

## 2015-06-27 NOTE — Progress Notes (Signed)
Report given to 4W patient will be transferred to that unit for inpatient rehab.

## 2015-06-27 NOTE — Interval H&P Note (Signed)
Darryl [name] was admitted today to Inpatient Rehabilitation with the diagnosis of SAH with hydrocephalus due to ruptured aneurysm.  The patient's history has been reviewed, patient examined, and there is no change in status.  Patient continues to be appropriate for intensive inpatient rehabilitation.  I have reviewed the patient's chart and labs.  Questions were answered to the patient's satisfaction. The PAPE has been reviewed and assessment remains appropriate.  Darryl [name] Juba 06/27/2015, 6:19 PM

## 2015-06-28 ENCOUNTER — Ambulatory Visit (HOSPITAL_COMMUNITY): Payer: BC Managed Care – PPO

## 2015-06-28 ENCOUNTER — Inpatient Hospital Stay (HOSPITAL_COMMUNITY): Payer: BC Managed Care – PPO | Admitting: Speech Pathology

## 2015-06-28 ENCOUNTER — Inpatient Hospital Stay (HOSPITAL_COMMUNITY): Payer: BC Managed Care – PPO | Admitting: Occupational Therapy

## 2015-06-28 ENCOUNTER — Inpatient Hospital Stay (HOSPITAL_COMMUNITY): Payer: BC Managed Care – PPO | Admitting: Physical Therapy

## 2015-06-28 DIAGNOSIS — I69919 Unspecified symptoms and signs involving cognitive functions following unspecified cerebrovascular disease: Secondary | ICD-10-CM

## 2015-06-28 LAB — CBC WITH DIFFERENTIAL/PLATELET
Basophils Absolute: 0 10*3/uL (ref 0.0–0.1)
Basophils Relative: 0 %
Eosinophils Absolute: 0.1 10*3/uL (ref 0.0–0.7)
Eosinophils Relative: 1 %
HCT: 32.8 % — ABNORMAL LOW (ref 39.0–52.0)
Hemoglobin: 11.6 g/dL — ABNORMAL LOW (ref 13.0–17.0)
Lymphocytes Relative: 12 %
Lymphs Abs: 1.3 10*3/uL (ref 0.7–4.0)
MCH: 26.4 pg (ref 26.0–34.0)
MCHC: 35.4 g/dL (ref 30.0–36.0)
MCV: 74.5 fL — ABNORMAL LOW (ref 78.0–100.0)
Monocytes Absolute: 0.7 10*3/uL (ref 0.1–1.0)
Monocytes Relative: 7 %
Neutro Abs: 8.4 10*3/uL — ABNORMAL HIGH (ref 1.7–7.7)
Neutrophils Relative %: 80 %
Platelets: 393 10*3/uL (ref 150–400)
RBC: 4.4 MIL/uL (ref 4.22–5.81)
RDW: 13.7 % (ref 11.5–15.5)
WBC: 10.5 10*3/uL (ref 4.0–10.5)

## 2015-06-28 LAB — BASIC METABOLIC PANEL
Anion gap: 7 (ref 5–15)
BUN: 7 mg/dL (ref 6–20)
CO2: 26 mmol/L (ref 22–32)
Calcium: 8.7 mg/dL — ABNORMAL LOW (ref 8.9–10.3)
Chloride: 104 mmol/L (ref 101–111)
Creatinine, Ser: 0.94 mg/dL (ref 0.61–1.24)
GFR calc Af Amer: >60 mL/min (ref >60)
GFR calc non Af Amer: >60 mL/min (ref >60)
Glucose, Bld: 179 mg/dL — ABNORMAL HIGH (ref 65–99)
Potassium: 3.3 mmol/L — ABNORMAL LOW (ref 3.5–5.1)
Sodium: 137 mmol/L (ref 135–145)

## 2015-06-28 LAB — URINE MICROSCOPIC-ADD ON

## 2015-06-28 LAB — URINALYSIS, ROUTINE W REFLEX MICROSCOPIC
Bilirubin Urine: NEGATIVE
Glucose, UA: NEGATIVE mg/dL
Ketones, ur: 15 mg/dL — AB
Leukocytes, UA: NEGATIVE
Nitrite: NEGATIVE
Protein, ur: 100 mg/dL — AB
Specific Gravity, Urine: 1.015 (ref 1.005–1.030)
pH: 7.5 (ref 5.0–8.0)

## 2015-06-28 LAB — COMPREHENSIVE METABOLIC PANEL
ALT: 17 U/L (ref 17–63)
AST: 20 U/L (ref 15–41)
Albumin: 2.6 g/dL — ABNORMAL LOW (ref 3.5–5.0)
Alkaline Phosphatase: 46 U/L (ref 38–126)
Anion gap: 11 (ref 5–15)
BUN: 7 mg/dL (ref 6–20)
CO2: 25 mmol/L (ref 22–32)
Calcium: 8.6 mg/dL — ABNORMAL LOW (ref 8.9–10.3)
Chloride: 102 mmol/L (ref 101–111)
Creatinine, Ser: 0.88 mg/dL (ref 0.61–1.24)
GFR calc Af Amer: >60 mL/min (ref >60)
GFR calc non Af Amer: >60 mL/min (ref >60)
Glucose, Bld: 176 mg/dL — ABNORMAL HIGH (ref 65–99)
Potassium: 2.6 mmol/L — CL (ref 3.5–5.1)
Sodium: 138 mmol/L (ref 135–145)
Total Bilirubin: 0.8 mg/dL (ref 0.3–1.2)
Total Protein: 5.2 g/dL — ABNORMAL LOW (ref 6.5–8.1)

## 2015-06-28 LAB — GLUCOSE, CAPILLARY
Glucose-Capillary: 133 mg/dL — ABNORMAL HIGH (ref 65–99)
Glucose-Capillary: 134 mg/dL — ABNORMAL HIGH (ref 65–99)
Glucose-Capillary: 123 mg/dL — ABNORMAL HIGH (ref 65–99)
Glucose-Capillary: 101 mg/dL — ABNORMAL HIGH (ref 65–99)

## 2015-06-28 MED ORDER — POTASSIUM CHLORIDE 10 MEQ/50ML IV SOLN
10.0000 meq | INTRAVENOUS | Status: DC | PRN
  Filled 2015-06-28: qty 50

## 2015-06-28 MED ORDER — POTASSIUM CHLORIDE CRYS ER 20 MEQ PO TBCR
40.0000 meq | EXTENDED_RELEASE_TABLET | Freq: Once | ORAL | Status: AC
  Administered 2015-06-28: 40 meq via ORAL

## 2015-06-28 MED ORDER — GLUCERNA SHAKE PO LIQD
237.0000 mL | Freq: Two times a day (BID) | ORAL | Status: DC
  Administered 2015-06-28 – 2015-07-02 (×4): 237 mL via ORAL

## 2015-06-28 MED ORDER — POTASSIUM CHLORIDE 10 MEQ/100ML IV SOLN
10.0000 meq | INTRAVENOUS | Status: AC
  Administered 2015-06-28 (×3): 10 meq via INTRAVENOUS
  Filled 2015-06-28 (×3): qty 100

## 2015-06-28 MED ORDER — POTASSIUM CHLORIDE CRYS ER 20 MEQ PO TBCR
20.0000 meq | EXTENDED_RELEASE_TABLET | Freq: Two times a day (BID) | ORAL | Status: DC
  Administered 2015-06-28 – 2015-06-29 (×3): 20 meq via ORAL
  Filled 2015-06-28 (×3): qty 1

## 2015-06-28 NOTE — Evaluation (Signed)
Occupational Therapy Assessment and Plan  Patient Details  Name: Darryl [name] MRN: [id number] Date of Birth: 05-17-1951  OT Diagnosis: abnormal posture, acute pain, altered mental status, cognitive deficits, muscle weakness (generalized) and pain in joint Rehab Potential: Rehab Potential (ACUTE ONLY): Good ELOS: 17-19 days   Today's Date: 06/28/2015 OT Individual Time: 1400-1530 OT Individual Time Calculation (min): 90 min     Problem List:  Patient Active Problem List   Diagnosis Date Noted  . Pain   . Poor fluid intake   . Essential hypertension   . Type 2 diabetes mellitus with complication, without long-term current use of insulin ([place])   . Disorientation   . Convulsions ([place])   . Acute blood loss anemia   . Hypokalemia   . Ileus ([place])   . Leukocytosis   . Right knee pain   . Cognitive safety issue   . Impulsiveness   . PVC (premature ventricular contraction)   . Abdominal discomfort   . SAH (subarachnoid hemorrhage) ([place]) 06/11/2015  . Emesis   . Subarachnoid hemorrhage ([place]) 06/10/2015  . Hypertension   . Diabetes mellitus without complication [facility])     Past Medical History:  Past Medical History  Diagnosis Date  . Hypertension   . Seizures ([place])   . Diabetes mellitus without complication [facility])    Past Surgical History:  Past Surgical History  Procedure Laterality Date  . Knee surgery    . Cholecystectomy    . Radiology with anesthesia N/A 06/11/2015    Procedure: RADIOLOGY WITH ANESTHESIA;  Surgeon: [name], MD;  Location: [place];  Service: Radiology;  Laterality: N/A;    Assessment & Plan Clinical Impression: Patient is a 65 y.o. year old male with recent admission to the hospital on 06/10/15 with severe HA but CT head without acute changes. Late that day he had worsening of HA followed by N/V later with unresponsiveness. Patient intubated and sedated in ED. 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  He was evaluated by Dr. Saintclair Halsted and ventriculostomy placed for management of hydrocephalus. 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  Patient transferred to CIR on 06/27/2015 .    Patient currently requires total with basic self-care skills secondary to muscle weakness, impaired timing and sequencing and ataxia, decreased attention, decreased awareness, decreased problem solving, decreased safety awareness and decreased memory and decreased sitting balance, decreased standing balance, decreased postural control and decreased balance strategies.  Prior to hospitalization, patient could complete ADLs with independent .  Patient will benefit from skilled intervention to decrease level of assist with basic self-care skills, increase independence with basic self-care skills and increase level of independence with iADL prior to discharge home with care partner.  Anticipate patient will require 24 hour supervision and follow up home health.  OT - End of Session Activity Tolerance: Decreased this session Endurance Deficit: Yes Endurance Deficit Description: Pt with limited participation secondary to pain in hips and fatigue after completion of 10 mins of toileting and bathing tasks.  OT Assessment Rehab Potential (ACUTE ONLY): Good OT Patient demonstrates impairments in the following area(s): Cognition;Balance;Endurance;Motor;Safety;Pain OT Basic ADL's Functional Problem(s): Grooming;Bathing;Dressing;Toileting;Eating OT Advanced ADL's Functional Problem(s): Simple Meal Preparation OT Transfers Functional Problem(s): Toilet;Tub/Shower OT Additional Impairment(s): None OT Plan OT Intensity: Minimum of 1-2 x/day, 45 to 90 minutes OT Frequency: 5 out of 7  days OT Duration/Estimated Length of Stay: 17-19 days OT Treatment/Interventions: Balance/vestibular training;Cognitive  remediation/compensation;Discharge planning;Therapeutic Activities;Splinting/orthotics;Patient/family education;Neuromuscular re-education;DME/adaptive equipment instruction;Pain management;Community reintegration;Functional mobility training;Psychosocial support;UE/LE Coordination activities;UE/LE Strength taining/ROM;Therapeutic Exercise;Self Care/advanced ADL retraining OT Self Feeding Anticipated Outcome(s): independent OT Basic Self-Care Anticipated Outcome(s): supervision OT Toileting Anticipated Outcome(s): supervision OT Bathroom Transfers Anticipated Outcome(s): supervision OT Recommendation Patient destination: Home Follow Up Recommendations: Home health OT Equipment Recommended: 3 in 1 bedside comode;Tub/shower seat   Skilled Therapeutic Intervention Pt limited to bedside therapies secondary to having low Potassium.  Pt with increased pain in the right arm IV site during session.  Continually asking to take IV out of arm even though therapist re-directed on several occasions that he was getting potassium and it could not be removed.  Upon sitting EOB pt reporting increased pain in BLEs in the hip regions and continued to lay back down.  Max coaxing to maintain upright sitting to complete bathing and dressing tasks.  Pt with increased LOB backwards in sitting on one occasion when washing front peri area.  With standing pt maintains bilateral LE knee flexion.  During pivot from [facility] pt with increased ataxia noted and decreased ability to smoothly take steps.  Pt with difficulty reaching his feet while flexion both his trunk and hip when attempting to donn pants.  May need AE depending on progress.  Pt left in [place] bed with call button and phone within reach.  Discussed OT eval with pt's spouse as well.    OT Evaluation Precautions/Restrictions  Precautions Precautions: Fall Restrictions Weight Bearing Restrictions: No  Vital Signs Therapy Vitals Temp: 98.6 F (37 C) Temp Source:  Oral Pulse Rate: 68 Resp: 18 BP: 128/80 mmHg Patient Position (if appropriate): Sitting Oxygen Therapy SpO2: 99 % O2 Device: Not Delivered Pain Pain Assessment Pain Assessment: Faces Faces Pain Scale: Hurts whole lot Pain Type: Acute pain Pain Location: Leg Pain Orientation: Upper;Left;Right Pain Intervention(s): RN made aware;Repositioned;Emotional support Home Living/Prior Functioning Home Living Available Help at Discharge: Family, Available 24 hours/day Type of Home: House Home Access: Stairs to enter [employer] of Steps: 10 Entrance Stairs-Rails: Right, Left Home Layout: Two level, 1/2 bath on main level Alternate Level Stairs-Number of Steps: flight Alternate Level Stairs-Rails: Left Bathroom Shower/Tub: Multimedia programmer: Handicapped height Bathroom Accessibility: Yes  Lives With: Spouse IADL History Education: Theatre manager from [employer] per pt Prior Function Level of Independence: Independent with basic ADLs, Independent with gait, Independent with transfers  Able to Take Stairs?: Yes Driving: Yes Vocation: Full time employment Vocation Requirements: Optometrist at [employer] Comments: sports ADL  See Function Section of chart for details  Vision/Perception  Vision- History Baseline Vision/History: Wears glasses Wears Glasses: At all times Patient Visual Report: Blurring of vision Vision- Assessment Vision Assessment?: Yes Eye Alignment: Impaired (comment) (left eye sits inferior compared to the right eye) Ocular Range of Motion: Within Functional Limits Tracking/Visual Pursuits: Able to track stimulus in all quads without difficulty Visual Fields: No apparent deficits  Cognition Overall Cognitive Status: Impaired/Different from baseline Arousal/Alertness: Awake/alert Orientation Level: Person;Place;Situation Person: Oriented Place: Oriented Situation: Oriented Year: 2018 Month: February Day of Week: Incorrect Memory:  Impaired Memory Impairment: Decreased short term memory;Decreased recall of new information;Retrieval deficit Decreased Long Term Memory: Functional basic;Verbal basic Decreased Short Term Memory: Functional basic;Verbal basic Immediate Memory Recall: Sock;Blue;Bed Memory Recall: Sock;Blue;Bed Memory Recall Sock: Without Cue Memory Recall Blue: Without Cue Memory Recall Bed: Without Cue Attention: Sustained Focused Attention: Appears intact Sustained Attention: Impaired Sustained Attention  Impairment: Functional basic;Functional complex Awareness: Impaired Awareness Impairment: Intellectual impairment Problem Solving: Impaired Problem Solving Impairment: Verbal basic;Functional basic Executive Function:  (all impaired due to lower level deficits ) Behaviors: Impulsive;Perseveration;Restless Safety/Judgment: Impaired Comments: Pt perseverating on getting IV out of the right arm secondary to burning from potassium.  Pt told on multiple occasions why he needed to have th potassium and let ist  Sensation Sensation Light Touch: Appears Intact Stereognosis: Appears Intact Hot/Cold: Appears Intact Proprioception: Appears Intact Coordination Gross Motor Movements are Fluid and Coordinated: Yes Fine Motor Movements are Fluid and Coordinated: Yes (In BUEs with functional selfcare tasks) Motor  Motor Motor: Ataxia Motor - Skilled Clinical Observations: Pt with slight ataxiic like movements in the LEs when standing with knees remained in flexion.  will continiue to further assess in treatment to see if it is neurological vs weakness. Mobility  Bed Mobility Bed Mobility: Supine to Sit;Sit to Supine Supine to Sit: 3: Mod assist;HOB flat Supine to Sit Details: Manual facilitation for weight shifting;Verbal cues for precautions/safety Sit to Supine: 3: Mod assist Sit to Supine - Details: Manual facilitation for weight shifting;Verbal cues for precautions/safety  Trunk/Postural Assessment   Cervical Assessment Cervical Assessment: Within Functional Limits Thoracic Assessment Thoracic Assessment: Exceptions to [facility] (Pt with rounded back in sitting) Lumbar Assessment Lumbar Assessment: Exceptions to [facility] (pt demonstrates posterior lean in stitng with posterior pelvic tlilt noted) Postural Control Postural Control: Deficits on evaluation Trunk Control: Pt needed assistance bringing trunk into sitting from supine position secondary to abdominal weakness.  Balance Balance Balance Assessed: Yes Static Sitting Balance Static Sitting - Balance Support: Bilateral upper extremity supported Static Sitting - Level of Assistance: 5: Stand by assistance Dynamic Sitting Balance Dynamic Sitting - Balance Support: No upper extremity supported Dynamic Sitting - Level of Assistance: 3: Mod assist Sitting balance - Comments: LOB posteriorly when attempting to bring LEs up into sitting for removal of gripper socks or washing his feet. Static Standing Balance Static Standing - Balance Support: Right upper extremity supported;Left upper extremity supported Static Standing - Level of Assistance: 2: Max assist Dynamic Standing Balance Dynamic Standing - Balance Support: During functional activity Dynamic Standing - Level of Assistance: 1: +2 Total assist Extremity/Trunk Assessment RUE Assessment RUE Assessment: Within Functional Limits (At least 4/5 with gross testing throughout the UE) LUE Assessment LUE Assessment: Within Functional Limits (AROM and strength at least 4/5 throughout, slight tremor noted in the hand)   See Function Navigator for Current Functional Status.   Refer to Care Plan for Long Term Goals  Recommendations for other services: None  Discharge Criteria: Patient will be discharged from OT if patient refuses treatment 3 consecutive times without medical reason, if treatment goals not met, if there is a change in medical status, if patient makes no progress towards goals  or if patient is discharged from hospital.  The above assessment, treatment plan, treatment alternatives and goals were discussed and mutually agreed upon: by patient and by family  Unknown Schleyer OTR/L 06/28/2015, 4:59 PM

## 2015-06-28 NOTE — Progress Notes (Signed)
[facility] PHYSICAL MEDICINE & REHABILITATION     PROGRESS NOTE    Subjective/Complaints: Has headache. Apparently was up most of night. Doesn't like being in enclosure bed.  ROS limited by cognition and behavior.  Objective: Vital Signs: Blood pressure 135/67, pulse 67, temperature 97.5 F (36.4 C), temperature source Oral, resp. rate 18, height  (1.803 m), SpO2 99 %. No results found. No results for input(s): WBC, HGB, HCT, PLT in the last 72 hours. No results for input(s): NA, K, CL, GLUCOSE, BUN, CREATININE, CALCIUM in the last 72 hours.  Invalid input(s): CO CBG (last 3)   Recent Labs  06/27/15 1836 06/27/15 2047 06/28/15 0636  GLUCAP 138* 112* 133*    Wt Readings from Last 3 Encounters:  06/13/15 89.6 kg (197 lb 8.5 oz)  06/08/15 91.627 kg (202 lb)  05/17/15 86.456 kg (190 lb 9.6 oz)    Physical Exam:  Constitutional: He appears well-developed and well-nourished.  HENT:  Head: Normocephalic and atraumatic.  Small incision on scalp with a couple of sutures and staples--+/- soft. Tender to touch.  Eyes: Conjunctivae are normal. Pupils are equal, round, and reactive to light.  Neck: Neck supple.  Limited ROM laterally and flexion limited due to pain.  Cardiovascular: Normal rate and regular rhythm.  Respiratory: Effort normal and breath sounds normal. No stridor. No respiratory distress. He has no wheezes.  GI: Soft. Bowel sounds are normal. He exhibits no distension. There is no tenderness.  Musculoskeletal: He exhibits no edema.  No tenderness in extremeties  Neurological: He is alert.  Keeps eyes closed.  A&Ox2, name and place.  Distracted and lacks insight and awareness of deficits. Irritable/impulsive He was able to follow one and occasional two step motor commands.  Pain with SLR bilaterally.  Motor: B/l UE 4/5 B/l hip flexion 4-/5, ankle dorsi/plantar flexion 4+/5 ---moves all 4's without obvious preference this am. Still too distractive to  perform full MMT  Skin: Skin is warm and dry.  Psychiatric: Cognition and memory are impaired. He expresses inappropriate judgment. He is inattentive.     Assessment/Plan:  1. Cognitive, swallowing, and mobility deficits secondary to [facility] which require 3+ hours per day of interdisciplinary therapy in a comprehensive inpatient rehab setting. Physiatrist is providing close team supervision and 24 hour management of active medical problems listed below. Physiatrist and rehab team continue to assess barriers to discharge/monitor patient progress toward functional and medical goals.  Function:  Bathing Bathing position      Bathing parts      Bathing assist        Upper Body Dressing/Undressing Upper body dressing                    Upper body assist        Lower Body Dressing/Undressing Lower body dressing                                  Lower body assist        Toileting Toileting          Toileting assist     Transfers Chair/bed transfer             Locomotion Ambulation           Wheelchair          Cognition Comprehension    Expression    Social Interaction    Problem Solving  Memory     Medical Problem List and Plan: 1. 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  2. DVT Prophylaxis/Anticoagulation: Mechanical: Sequential compression devices, below knee Bilateral lower extremities   -dopplers pending today 3. Pain Management: Hydrocodone providing minimal relief.   oxycodone prn for headaches and back pain.  4. Mood: LCSW to follow for evaluation and support.  5. Neuropsych: This patient is not  capable of making decisions on his own behalf.  -continue to provide appropriate environment for sleep and communication  -avoid overstimulating  -check sleep chart 6. Skin/Wound Care: routine pressure relief  measures.  7. Fluids/Electrolytes/Nutrition: Has been poor in part due to lethargy. Added nutritional supplements. Monitor for now.  .  -follow up labs today as available 8. HTN: Monitor BP tid   -resumed low dose Cozaar. Titrate medications as indicated. Fair control at present  9. DM type 2: Monitor BS ac/hs basis. Po intake has been variable due to lethargy and cognitive issues. Wife encouraged to bring pureed foods from home.   -continue to hold metformin and use SSI for now. sugars under reasonable control at present 10. Seizures disorder: Was on lamictal bid prior to admission. On keppra bid which has been changed to PO 11. ABLA: continue iron supplement.. Labs all pending today 12. Mild ileus: Has resolved with resolution of abdominal pain and distension. He has been refusing amitiza as well as doses of sennokot. Will modify and simplify bowel regimen to two Senna S bid. Use enema prn.  13. Hypokalemia: Supplement to avoid recurrence of ileus.  14. Leucocytosis: with complaints of back pain. Blood work pending as is UA/CX.  -he is afebrile  15. Chronic right knee pain: Off NSAIDs. resumed Voltaren gel.  16. PVCs: Negative cardiac work up 05/2015 17. Safety:   enclosure bed for fall prevention/safety is still required   -order renewed today LOS (Days) 1 A FACE TO FACE EVALUATION WAS PERFORMED  [place] Youngblood T 06/28/2015 9:18 AM

## 2015-06-28 NOTE — Evaluation (Signed)
Speech Language Pathology Assessment and Plan  Patient Details  Name: Darryl [name] MRN: [id number] Date of Birth: [birth year]  SLP Diagnosis: Dysphagia;Cognitive Impairments  Rehab Potential: Excellent ELOS: 12-14 days     Today's Date: 06/28/2015 SLP Individual Time: 0800-0900 SLP Individual Time Calculation (min): 60 min   Problem List:  Patient Active Problem List   Diagnosis Date Noted  . Pain   . Poor fluid intake   . Essential hypertension   . Type 2 diabetes mellitus with complication, without long-term current use of insulin ([place])   . Disorientation   . Convulsions ([place])   . Acute blood loss anemia   . Hypokalemia   . Ileus ([place])   . Leukocytosis   . Right knee pain   . Cognitive safety issue   . Impulsiveness   . PVC (premature ventricular contraction)   . Abdominal discomfort   . SAH (subarachnoid hemorrhage) ([place]) 06/11/2015  . Emesis   . Subarachnoid hemorrhage ([place]) 06/10/2015  . Hypertension   . Diabetes mellitus without complication [facility])    Past Medical History:  Past Medical History  Diagnosis Date  . Hypertension   . Seizures ([place])   . Diabetes mellitus without complication [facility])    Past Surgical History:  Past Surgical History  Procedure Laterality Date  . Knee surgery    . Cholecystectomy    . Radiology with anesthesia N/A 06/11/2015    Procedure: RADIOLOGY WITH ANESTHESIA;  Surgeon: [name], MD;  Location: [facility];  Service: Radiology;  Laterality: N/A;    Assessment / Plan / Recommendation Clinical Impression Patient is a 65 y.o. male with history of HTN, DM type 2, PVCs, seizures who was seen in ED 06/10/15 with severe HA but CT head without acute changes. Late that day he had worsening of HA followed by N/V later with unresponsiveness.  Patient intubated and sedated in ED. 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 He was evaluated by Dr. Saintclair Halsted and ventriculostomy placed for management  of hydrocephalus.  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  He tolerated extubation on 01/10 without difficulty and has been monitored closely for vasospasms. Headaches have improved but patient with balance deficits, back pain, poor safety awareness requiring restraints, fluctuating mental status with confusion and disorientation. EVD drain removed 01/19 as CT head showing improvement.   He is showing improvement in mentation and participation but noted to have wet voice with abdominal distension and pain. On1/23/17, patient with noted deeper respiratory secretions and distended abdomen, thought to be related to small ileus.  Patient had deep suctioning and was given mag citrate with resultant bowel movements.  CIR recommended by MD and rehab team and patient admitted 06/27/15. Patient administered a cognitive-linguistic evaluation and demonstrates moderate-severe cognitive impairments impacting sustained attention, functional problem solving, recall, intellectual awareness and overall safety. Patient's cognitive function is also exacerbated by perseveration on constant leg pain. Patient was also administered a limited BSE due to poor PO intake. Patient consumed nectar-thick liquids via cup and demonstrated what appeared to be a delayed swallow initiation, however, he did not demonstrate any overt s/s of aspiration.  Patient also consumed Dys. 2 textures with prolonged mastication and moderate-severe oral residue which required Max verbal cues for patient to self-monitor and correct with multiple liquid washes and eventual oral care. Therefore, recommend patient downgrade to  Dys. 1 textures and continue nectar-thick liquids via cup with full supervision to maximize oral clearance and overall safety with meals. Patient would benefit from skilled SLP intervention to maximize cognitive  and swallowing function and overall functional independence prior to discharge home.   Skilled Therapeutic Interventions          Administered a cognitive-linguistic evaluation and BSE. Please see above for details.   SLP Assessment  Patient will need skilled Speech Lanaguage Pathology Services during CIR admission    Recommendations  SLP Diet Recommendations: Dysphagia 1 (Puree);Nectar Liquid Administration via: Cup;No straw Medication Administration: Crushed with puree Supervision: Patient able to self feed;Full supervision/cueing for compensatory strategies Compensations: Minimize environmental distractions;Slow rate;Small sips/bites;Follow solids with liquid Postural Changes and/or Swallow Maneuvers: Seated upright 90 degrees Oral Care Recommendations: Oral care QID Recommendations for Other Services: Neuropsych consult Patient destination: Home Follow up Recommendations: Home Health SLP;Outpatient SLP;24 hour supervision/assistance Equipment Recommended: To be determined    SLP Frequency 3 to 5 out of 7 days   SLP Duration  SLP Intensity  SLP Treatment/Interventions 12-14 days   Minumum of 1-2 x/day, 30 to 90 minutes  Cognitive remediation/compensation;Cueing hierarchy;Functional tasks;Patient/family education;Therapeutic Activities;Internal/external aids;Environmental controls;Dysphagia/aspiration precaution training    Pain Pain Assessment Pain Assessment: Faces Faces Pain Scale: Hurts whole lot Pain Type: Acute pain Pain Location: Leg Pain Orientation: Upper;Left;Right Pain Intervention(s): RN made aware and administered medications;Repositioned;Emotional support  Prior Functioning Type of Home: House  Lives With: Spouse Available Help at Discharge: Family;Available 24 hours/day Education: Theatre manager from [employer] per pt Vocation: Full time employment  Function:  Eating Eating   Modified Consistency Diet: Yes Eating Assist Level: Set up assist  for;More than reasonable amount of time;Supervision or verbal cues;Helper checks for pocketed food           Cognition Comprehension Comprehension assist level: Understands basic 50 - 74% of the time/ requires cueing 25 - 49% of the time  Expression   Expression assist level: Expresses basic 75 - 89% of the time/requires cueing 10 - 24% of the time. Needs helper to occlude trach/needs to repeat words.  Social Interaction Social Interaction assist level: Interacts appropriately 50 - 74% of the time - May be physically or verbally inappropriate.  Problem Solving Problem solving assist level: Solves basic 50 - 74% of the time/requires cueing 25 - 49% of the time  Memory Memory assist level: Recognizes or recalls 25 - 49% of the time/requires cueing 50 - 75% of the time   Short Term Goals: Week 1: SLP Short Term Goal 1 (Week 1): Patient will orient to time, place and situation with Mod A question and verbal cues.  SLP Short Term Goal 2 (Week 1): Patient will demonstrate sustained attention to functional tasks for 5 minutes with Mod A verbal cues for redirection. SLP Short Term Goal 3 (Week 1): Patient will identify 1 physical and 1 cognitive deficit with Mod A verbal and question cues.  SLP Short Term Goal 4 (Week 1): Patient will consume current diet with minimal overt s/s of aspiration with Mod A verbal cues for use of swallowing compensatory strategies.  SLP Short Term Goal 5 (Week 1): Patient will consume trials of Dys. 2 textures and demonstrate efficent mastication with minimal oral residue with Min A verbal cues to self-monitor and correct oral residue over 2 consecutive sessions prior to upgrade.  SLP Short Term Goal 6 (Week 1): Patient will consume trials of thin liquids via cup with minimal overt s/s  of aspiration with Min A verbal cues for use of swallowing compensatory strategies over 2 consecutive sessions prior to repeat MBS for possible upgrade.   Refer to Care Plan for Long Term  Goals  Recommendations for other services: Neuropsych  Discharge Criteria: Patient will be discharged from SLP if patient refuses treatment 3 consecutive times without medical reason, if treatment goals not met, if there is a change in medical status, if patient makes no progress towards goals or if patient is discharged from hospital.  The above assessment, treatment plan, treatment alternatives and goals were discussed and mutually agreed upon: No family available/patient unable  [place], [place] 06/28/2015, 5:15 PM

## 2015-06-28 NOTE — Progress Notes (Signed)
Initial Nutrition Assessment  DOCUMENTATION CODES:   Not applicable  INTERVENTION:  Provide Glucerna Shake po BID (thickened to appropriate consistency), each supplement provides 220 kcal and 10 grams of protein.  Encourage adequate PO intake.   NUTRITION DIAGNOSIS:   Inadequate oral intake related to lethargy/confusion as evidenced by meal completion < 50%.  GOAL:   Patient will meet greater than or equal to 90% of their needs  MONITOR:   PO intake, Supplement acceptance, Diet advancement, Weight trends, Labs, I & O's  REASON FOR ASSESSMENT:    (Supplements)    ASSESSMENT:   65 y.o. male with history fo HTN, DM type 2, PVCs, seizures who was seen in ED 06/10/15 with severe HA but CT head without acute changes. Late that day he had worsening of HA followed by N/V later with unresponsiveness. Patient intubated and sedated in ED. 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 Extubated  1/10  No meal completion percent recorded, however per RN pt has been distracted and has cognitive deficits thus intake has been mostly poor. Noted no new weight recorded. Recommend obtaining new weight to fully assess weight trends. Pt reports not liking his food at meals. Of note, pt's diet has been downgraded to a dysphagia 1 diet with nectar thick liquids. Noted, pt has Glucerna 1.2 ordered. RD to change orders to Glucerna shake. Pt was in an enclosed bed during time of visit. RD unable to perform nutrition focused physical exam, however per RD exam on 1/19, pt with no observed significant fat or muscle mass loss.   Labs and medications reviewed.   Diet Order:  DIET - DYS 1 Room service appropriate?: Yes; Fluid consistency:: Nectar Thick  Skin:   (Incision on R head)  Last BM:  1/25  Height:   Ht Readings from Last 1 Encounters:  06/27/15  (1.803 m)    Weight:   Wt Readings from Last 1 Encounters:  06/13/15 197 lb 8.5 oz (89.6 kg)     Ideal Body Weight:  78 kg  BMI:  There is no weight on file to calculate BMI.  Estimated Nutritional Needs:   Kcal:  2000-2200  Protein:  90-110 grams  Fluid:  2- 2.2 L/day  EDUCATION NEEDS:   No education needs identified at this time  Roslyn Smiling, MS, RD, LDN Pager # [phone] After hours/ weekend pager # [phone]

## 2015-06-28 NOTE — Evaluation (Signed)
Physical Therapy Assessment and Plan  Patient Details  Name: Darryl [name] MRN: [id number] Date of Birth: 08/10/1950  PT Diagnosis: Abnormal posture, Abnormality of gait, Cognitive deficits, Difficulty walking, Muscle weakness and Pain in bilateral hips Rehab Potential: Good ELOS: 12-14 days   Today's Date: 06/28/2015 PT Individual Time:900-955   55 min    Problem List:  Patient Active Problem List   Diagnosis Date Noted  . Pain   . Poor fluid intake   . Essential hypertension   . Type 2 diabetes mellitus with complication, without long-term current use of insulin ([place])   . Disorientation   . Convulsions ([place])   . Acute blood loss anemia   . Hypokalemia   . Ileus ([place])   . Leukocytosis   . Right knee pain   . Cognitive safety issue   . Impulsiveness   . PVC (premature ventricular contraction)   . Abdominal discomfort   . SAH (subarachnoid hemorrhage) ([place]) 06/11/2015  . Emesis   . Subarachnoid hemorrhage ([place]) 06/10/2015  . Hypertension   . Diabetes mellitus without complication [facility])     Past Medical History:  Past Medical History  Diagnosis Date  . Hypertension   . Seizures ([place])   . Diabetes mellitus without complication [facility])    Past Surgical History:  Past Surgical History  Procedure Laterality Date  . Knee surgery    . Cholecystectomy    . Radiology with anesthesia N/A 06/11/2015    Procedure: RADIOLOGY WITH ANESTHESIA;  Surgeon: [name], MD;  Location: [place];  Service: Radiology;  Laterality: N/A;    Assessment & Plan Clinical Impression: Darryl [name] is a 65 y.o. male with history fo HTN, DM type 2, PVCs, seizures who was seen in ED 06/10/15 with severe HA but CT head without acute changes. Late that day he had worsening of HA followed by N/V later with unresponsiveness. Patient intubated and sedated in ED. 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 He was evaluated by Dr. Saintclair Halsted and ventriculostomy  placed for management of hydrocephalus. 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 He tolerated extubation on 01/10 without difficulty and has been monitored closely for vasospasms. Headaches have improved but patient with balance deficits, back pain, poor safety awareness requiring restraints, fluctuating mental status with confusion and disorientation. EVD drain removed 01/19 as CT head showing improvement. He is showing improvement in mentation and participation but noted to have wet voice with abdominal distension and pain. Patient transferred to CIR on 06/27/2015.   Patient currently requires total with mobility secondary to muscle weakness and muscle joint tightness, decreased cardiorespiratoy endurance, decreased coordination, decreased attention, decreased awareness, decreased problem solving, decreased safety awareness, decreased memory and delayed processing and decreased sitting balance, decreased standing balance, decreased postural control and decreased balance strategies.  Prior to hospitalization, patient was independent  with mobility and lived with Spouse in a House home.  Home access is 10Stairs to enter.  Patient will benefit from skilled PT intervention to maximize safe functional mobility, minimize fall risk and decrease caregiver burden for planned discharge home with 24 hour supervision.  Anticipate patient will benefit from follow up [place] at discharge.  PT - End of Session Activity Tolerance: Decreased this session;Tolerates 30+ min activity with multiple rests Endurance Deficit: Yes Endurance Deficit Description: patient greatly limited by pain in bilateral hips and  fatigue PT Assessment Rehab Potential (ACUTE/IP ONLY): Good Barriers to Discharge: Inaccessible home environment PT Patient demonstrates impairments in the following area(s):  Balance;Behavior;Endurance;Motor;Nutrition;Pain;Safety PT Transfers Functional Problem(s): Bed Mobility;Bed to Chair;Car;Furniture PT Locomotion Functional Problem(s): Ambulation;Wheelchair Mobility;Stairs PT Plan PT Intensity: Minimum of 1-2 x/day ,45 to 90 minutes PT Frequency: 5 out of 7 days PT Duration Estimated Length of Stay: 12-14 days PT Treatment/Interventions: Ambulation/gait training;Balance/vestibular training;Cognitive remediation/compensation;Community reintegration;Discharge planning;Disease management/prevention;DME/adaptive equipment instruction;Functional mobility training;Neuromuscular re-education;Pain management;Patient/family education;Psychosocial support;Stair training;Therapeutic Activities;Therapeutic Exercise;UE/LE Strength taining/ROM;UE/LE Coordination activities;Wheelchair propulsion/positioning PT Transfers Anticipated Outcome(s): supervision PT Locomotion Anticipated Outcome(s): supervision PT Recommendation Recommendations for Other Services: Neuropsych consult Follow Up Recommendations: Home health PT;24 hour supervision/assistance Patient destination: Home Equipment Recommended: To be determined  Skilled Therapeutic Intervention Skilled therapeutic intervention initiated after completion of evaluation. Discussed with patient falls risk, safety within room, and focus of therapy during stay. Patient requesting to return to bed upon arrival following SLP evaluation, provided rest break in bed while reviewing home setup/PLOF until patient requested to toilet. Patient oriented to self, city, and month. Patient limited throughout session due to bilateral hip/lower extremity pain and fatigue. Patient required encouragement to participate due to constantly asking to return to bed/perform tasks tomorrow. Patient required +2A for all mobility via 3 musketeers technique, see below for details. Patient returned to enclosure bed at end of session with call bell within reach  and patient appropriately demonstrated use of call bell.   PT Evaluation Precautions/Restrictions Precautions Precautions: Fall Restrictions Weight Bearing Restrictions: No General Chart Reviewed: Yes Family/Caregiver Present: No  Pain Pain Assessment Pain Assessment: Faces Faces Pain Scale: Hurts whole lot Pain Type: Acute pain Pain Location: Leg Pain Orientation: Upper;Left;Right Pain Intervention(s): RN made aware;Repositioned;Emotional support Home Living/Prior Functioning Home Living Available Help at Discharge: Family;Available 24 hours/day Type of Home: House Home Access: Stairs to enter [employer] of Steps: 10 Home Layout: Two level;1/2 bath on main level Alternate Level Stairs-Number of Steps: flight Alternate Level Stairs-Rails: Left Bathroom Shower/Tub: Multimedia programmer: Handicapped height Bathroom Accessibility: Yes  Lives With: Spouse Prior Function Level of Independence: Independent with basic ADLs;Independent with gait;Independent with transfers  Able to Take Stairs?: Yes Driving: Yes Vocation: Full time employment Vocation Requirements: Accountant at [employer] Comments: sports Vision/Perception  Vision - Assessment Eye Alignment: Impaired (comment) (left eye sits inferior compared to the right eye) Ocular Range of Motion: Within Functional Limits Tracking/Visual Pursuits: Able to track stimulus in all quads without difficulty  Cognition Overall Cognitive Status: Impaired/Different from baseline Arousal/Alertness: Awake/alert Orientation Level: Oriented to person;Disoriented to time;Disoriented to place;Disoriented to situation Attention: Sustained Focused Attention: Appears intact Sustained Attention: Impaired Sustained Attention Impairment: Functional basic;Functional complex Memory: Impaired Memory Impairment: Decreased short term memory;Decreased recall of new information;Retrieval deficit Decreased Long Term Memory:  Functional basic;Verbal basic Decreased Short Term Memory: Functional basic;Verbal basic Awareness: Impaired Awareness Impairment: Intellectual impairment Problem Solving: Impaired Problem Solving Impairment: Verbal basic;Functional basic Executive Function:  (all impaired due to lower level deficits ) Behaviors: Impulsive;Perseveration;Restless Safety/Judgment: Impaired Comments: Pt perseverating on getting IV out of the right arm secondary to burning from potassium.  Pt told on multiple occasions why he needed to have th potassium and let ist  Sensation Sensation Light Touch: Appears Intact Stereognosis: Appears Intact Hot/Cold: Appears Intact Proprioception: Appears Intact Coordination Gross Motor Movements are Fluid and Coordinated: No Fine Motor Movements are Fluid and Coordinated: Yes Coordination and Movement Description: Limited by BLE pain Motor  Motor Motor: Ataxia Motor - Skilled Clinical Observations: Decreased  coordination BLE, possibly due to pain   Mobility Bed Mobility Bed Mobility: Supine to Sit;Sit to Supine Supine to Sit: 5: Supervision;HOB flat Supine to Sit Details: Manual facilitation for weight shifting;Verbal cues for precautions/safety Sit to Supine: 5: Supervision;HOB flat Sit to Supine - Details: Manual facilitation for weight shifting;Verbal cues for precautions/safety Transfers Transfers: Yes Sit to Stand: 1: +2 Total assist Sit to Stand Details: Verbal cues for technique Stand to Sit: 1: +2 Total assist Stand to Sit Details (indicate cue type and reason): Verbal cues for technique Locomotion  Ambulation Ambulation: Yes Ambulation/Gait Assistance: 1: +2 Total assist Ambulation Distance (Feet): 15 Feet Assistive device: Other (Comment) (3 musketeers) Ambulation/Gait Assistance Details: Verbal cues for precautions/safety;Tactile cues for posture Gait Gait: Yes Gait Pattern: Impaired Gait Pattern: Step-through  pattern;Shuffle;Antalgic;Ataxic;Trunk flexed;Decreased trunk rotation;Left flexed knee in stance;Right flexed knee in stance Stairs / Additional Locomotion Stairs: Yes Stairs Assistance: 1: +2 Total assist Stair Management Technique: Backwards;Forwards;Step to pattern (3 musketeers) Number of Stairs: 1 Height of Stairs: 3 Ramp: Not tested (comment) Curb: Not tested (comment) Product manager Mobility: Yes Wheelchair Assistance: 4: Advertising account executive Details: Verbal cues for sequencing;Verbal cues for technique;Verbal cues for Information systems manager: Both upper extremities Wheelchair Parts Management: Needs assistance  Trunk/Postural Assessment  Cervical Assessment Cervical Assessment: Within Functional Limits Thoracic Assessment Thoracic Assessment: Exceptions to [facility] (kyphotic) Lumbar Assessment Lumbar Assessment: Exceptions to [facility] (posterior pelvic tilt) Postural Control Postural Control: Deficits on evaluation Trunk Control: Pt needed assistance bringing trunk into sitting from supine position secondary to abdominal weakness. Protective Responses: impaired/delayed  Balance Balance Balance Assessed: Yes Static Sitting Balance Static Sitting - Balance Support: Bilateral upper extremity supported Static Sitting - Level of Assistance: 5: Stand by assistance Dynamic Sitting Balance Dynamic Sitting - Balance Support: No upper extremity supported Dynamic Sitting - Level of Assistance: 3: Mod assist Sitting balance - Comments: LOB posteriorly when attempting to bring LEs up into sitting for removal of gripper socks or washing his feet. Static Standing Balance Static Standing - Balance Support: Right upper extremity supported;Left upper extremity supported Static Standing - Level of Assistance: 2: Max assist Dynamic Standing Balance Dynamic Standing - Balance Support: During functional activity Dynamic Standing - Level of Assistance: 1:  +2 Total assist Extremity Assessment  RUE Assessment RUE Assessment: Within Functional Limits (At least 4/5 with gross testing throughout the UE) LUE Assessment LUE Assessment: Within Functional Limits (AROM and strength at least 4/5 throughout, slight tremor noted in the hand) RLE Assessment RLE Assessment: Exceptions to [facility] RLE Strength RLE Overall Strength: Deficits RLE Overall Strength Comments: grossly 3-/5 throughout LLE Assessment LLE Assessment: Exceptions to [facility] LLE Strength LLE Overall Strength: Deficits LLE Overall Strength Comments: grossly 3-/5 throughout   See Function Navigator for Current Functional Status.   Refer to Care Plan for Long Term Goals  Recommendations for other services: Neuropsych  Discharge Criteria: Patient will be discharged from PT if patient refuses treatment 3 consecutive times without medical reason, if treatment goals not met, if there is a change in medical status, if patient makes no progress towards goals or if patient is discharged from hospital.  The above assessment, treatment plan, treatment alternatives and goals were discussed and mutually agreed upon: by patient  Laretta Alstrom 06/28/2015, 5:09 PM

## 2015-06-29 ENCOUNTER — Inpatient Hospital Stay (HOSPITAL_COMMUNITY): Payer: BC Managed Care – PPO | Admitting: Speech Pathology

## 2015-06-29 ENCOUNTER — Ambulatory Visit (HOSPITAL_COMMUNITY): Payer: BC Managed Care – PPO | Attending: Physical Medicine and Rehabilitation

## 2015-06-29 ENCOUNTER — Inpatient Hospital Stay (HOSPITAL_COMMUNITY): Payer: BC Managed Care – PPO | Admitting: Physical Therapy

## 2015-06-29 ENCOUNTER — Inpatient Hospital Stay (HOSPITAL_COMMUNITY): Payer: BC Managed Care – PPO

## 2015-06-29 DIAGNOSIS — M79609 Pain in unspecified limb: Secondary | ICD-10-CM | POA: Diagnosis not present

## 2015-06-29 DIAGNOSIS — R52 Pain, unspecified: Secondary | ICD-10-CM | POA: Diagnosis not present

## 2015-06-29 DIAGNOSIS — I82432 Acute embolism and thrombosis of left popliteal vein: Secondary | ICD-10-CM | POA: Insufficient documentation

## 2015-06-29 LAB — BASIC METABOLIC PANEL
Anion gap: 10 (ref 5–15)
BUN: 6 mg/dL (ref 6–20)
CO2: 22 mmol/L (ref 22–32)
Calcium: 8.7 mg/dL — ABNORMAL LOW (ref 8.9–10.3)
Chloride: 106 mmol/L (ref 101–111)
Creatinine, Ser: 0.87 mg/dL (ref 0.61–1.24)
GFR calc Af Amer: >60 mL/min (ref >60)
GFR calc non Af Amer: >60 mL/min (ref >60)
Glucose, Bld: 124 mg/dL — ABNORMAL HIGH (ref 65–99)
Potassium: 3.3 mmol/L — ABNORMAL LOW (ref 3.5–5.1)
Sodium: 138 mmol/L (ref 135–145)

## 2015-06-29 LAB — GLUCOSE, CAPILLARY
Glucose-Capillary: 128 mg/dL — ABNORMAL HIGH (ref 65–99)
Glucose-Capillary: 134 mg/dL — ABNORMAL HIGH (ref 65–99)
Glucose-Capillary: 166 mg/dL — ABNORMAL HIGH (ref 65–99)
Glucose-Capillary: 162 mg/dL — ABNORMAL HIGH (ref 65–99)

## 2015-06-29 LAB — URINE CULTURE

## 2015-06-29 MED ORDER — RIVAROXABAN 15 MG PO TABS
15.0000 mg | ORAL_TABLET | Freq: Two times a day (BID) | ORAL | Status: AC
  Administered 2015-06-29 – 2015-07-19 (×42): 15 mg via ORAL
  Filled 2015-06-29 (×43): qty 1

## 2015-06-29 MED ORDER — POTASSIUM CHLORIDE CRYS ER 20 MEQ PO TBCR
20.0000 meq | EXTENDED_RELEASE_TABLET | Freq: Three times a day (TID) | ORAL | Status: DC
  Administered 2015-06-29 – 2015-07-24 (×75): 20 meq via ORAL
  Filled 2015-06-29 (×75): qty 1

## 2015-06-29 NOTE — Progress Notes (Addendum)
Contacted Dr. Conchita Paris to get input on DVT treatment. As patient almost 3 weeks out from bleed and s/p coiling, he felt that patient could be anticoagulated for treated for DVT.   Addendum: large femoral DVT extending to the popliteal vein. Initiating xarelto. Patient placed on bedrest today. Can mobilize the patient Sunday morning.   Ranelle Oyster, MD, [facility] Physical Medicine & Rehabilitation 06/29/2015

## 2015-06-29 NOTE — Progress Notes (Signed)
[place] PHYSICAL MEDICINE & REHABILITATION     PROGRESS NOTE    Subjective/Complaints: Has headache. Apparently was up most of night. Doesn't like being in enclosure bed.  ROS limited by cognition and behavior.  Objective: Vital Signs: Blood pressure 131/72, pulse 67, temperature 97.7 F (36.5 C), temperature source Oral, resp. rate 20, height  (1.803 m), SpO2 100 %. No results found.  Recent Labs  06/28/15 1030  WBC 10.5  HGB 11.6*  HCT 32.8*  PLT 393    Recent Labs  06/28/15 1620 06/29/15 0553  NA 137 138  K 3.3* 3.3*  CL 104 106  GLUCOSE 179* 124*  BUN 7 6  CREATININE 0.94 0.87  CALCIUM 8.7* 8.7*   CBG (last 3)   Recent Labs  06/28/15 1730 06/28/15 2128 06/29/15 0635  GLUCAP 123* 101* 128*    Wt Readings from Last 3 Encounters:  06/13/15 89.6 kg (197 lb 8.5 oz)  06/08/15 91.627 kg (202 lb)  05/17/15 86.456 kg (190 lb 9.6 oz)    Physical Exam:  Constitutional: He appears well-developed and well-nourished.  HENT:  Head: Normocephalic and atraumatic.  Small incision on scalp with a couple of sutures and staples--+/- soft. Tender to touch.  Eyes: Conjunctivae are normal. Pupils are equal, round, and reactive to light.  Neck: Neck supple.  Limited ROM laterally and flexion limited due to pain.  Cardiovascular: Normal rate and regular rhythm.  Respiratory: Effort normal and breath sounds normal. No stridor. No respiratory distress. He has no wheezes.  GI: Soft. Bowel sounds are normal. He exhibits no distension. There is no tenderness.  Musculoskeletal: He exhibits no edema.  No tenderness in extremeties  Neurological: He is alert.  Keeps eyes closed.  A&Ox2, name and place.  Distracted and lacks insight and awareness of deficits. Irritable/impulsive He was able to follow one and occasional two step motor commands.  Pain with SLR bilaterally.  Motor: B/l UE 4/5 B/l hip flexion 4-/5, ankle dorsi/plantar flexion 4+/5 ---moves all 4's  without obvious preference this am. Still too distractive to perform full MMT  Skin: Skin is warm and dry.  Psychiatric: Cognition and memory are impaired. He expresses inappropriate judgment. He is inattentive.     Assessment/Plan:  1. Cognitive, swallowing, and mobility deficits secondary to [facility] which require 3+ hours per day of interdisciplinary therapy in a comprehensive inpatient rehab setting. Physiatrist is providing close team supervision and 24 hour management of active medical problems listed below. Physiatrist and rehab team continue to assess barriers to discharge/monitor patient progress toward functional and medical goals.  Function:  Bathing Bathing position   Position: Wheelchair/chair at sink  Bathing parts Body parts bathed by patient: Right arm, Left arm, Chest, Abdomen, Front perineal area, Right upper leg, Left upper leg Body parts bathed by helper: Buttocks, Right lower leg, Left lower leg  Bathing assist        Upper Body Dressing/Undressing Upper body dressing   What is the patient wearing?: Pull over shirt/dress     Pull over shirt/dress - Perfomed by patient: Thread/unthread right sleeve, Thread/unthread left sleeve, Put head through opening, Pull shirt over trunk          Upper body assist Assist Level: Supervision or verbal cues, Set up   Set up : To obtain clothing/put away  Lower Body Dressing/Undressing Lower body dressing   What is the patient wearing?: Pants, Non-skid slipper socks   Underwear - Performed by helper: Thread/unthread right underwear leg, Thread/unthread left  underwear leg, Pull underwear up/down Pants- Performed by patient: Pull pants up/down, Thread/unthread left pants leg Pants- Performed by helper: Thread/unthread right pants leg, Thread/unthread left pants leg   Non-skid slipper socks- Performed by helper: Don/doff right sock, Don/doff left sock                  Lower body assist Assist for lower body dressing: 2  Helpers      Toileting Toileting   Toileting steps completed by patient: Performs perineal hygiene Toileting steps completed by helper: Adjust clothing prior to toileting, Adjust clothing after toileting    Toileting assist Assist level: Two helpers   Transfers Chair/bed transfer   Chair/bed transfer method: Ambulatory Chair/bed transfer assist level: 2 helpers       Science writer     Max distance: 15 ft Assist level: 2 helpers   Wheelchair   Type: Manual Max wheelchair distance: 50 Assist Level: Touching or steadying assistance (Pt > 75%)  Cognition Comprehension Comprehension assist level: Understands basic 50 - 74% of the time/ requires cueing 25 - 49% of the time  Expression Expression assist level: Expresses basic 50 - 74% of the time/requires cueing 25 - 49% of the time. Needs to repeat parts of sentences.  Social Interaction Social Interaction assist level: Interacts appropriately 75 - 89% of the time - Needs redirection for appropriate language or to initiate interaction.  Problem Solving Problem solving assist level: Solves basic 25 - 49% of the time - needs direction more than half the time to initiate, plan or complete simple activities  Memory Memory assist level: Recognizes or recalls less than 25% of the time/requires cueing greater than 75% of the time   Medical Problem List and Plan: 1. Problems with mobility, swallow function and cognitive deficits secondary to 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 Appears a little less distracted today  2. DVT Prophylaxis/Anticoagulation: Mechanical: Sequential compression devices, below knee Bilateral lower extremities   -dopplers STILL pending today 3. Pain Management:  oxycodone prn for headaches and back/leg pain.   -see #15 below 4. Mood: LCSW to follow for evaluation and support.  5. Neuropsych: This patient is not  capable of making decisions on his own  behalf.  -continue to provide appropriate environment for sleep and communication  -avoid overstimulating  -check sleep chart 6. Skin/Wound Care: routine pressure relief measures.  7. Fluids/Electrolytes/Nutrition: Has been poor in part due to lethargy. Added nutritional supplements. Monitor for now.  .  -follow up labs today as available 8. HTN: Monitor BP tid   -resumed low dose Cozaar. Titrate medications as indicated. Fair control at present  9. DM type 2: Monitor BS ac/hs basis. Po intake has been variable due to lethargy and cognitive issues. Wife encouraged to bring pureed foods from home.   -continue to hold metformin and use SSI for now. sugars remain under reasonable control at present 10. Seizures disorder: Was on lamictal bid prior to admission. On keppra bid which has been changed to PO 11. ABLA: continue iron supplement.. Labs all pending today 12. Mild ileus:   two Senna S bid. Use enema prn.    13. Hypokalemia: Supplement to avoid recurrence of ileus.  14. Leucocytosis: with complaints of back pain. Blood work pending as is UA/CX.  -he is afebrile  15. Chronic right knee pain/leg pain: Off NSAIDs.   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 Pain remains  vague/inconsistent due to cognitive status. Will continue to follow 16. PVCs: Negative cardiac work up 05/2015 17. Safety:   enclosure bed for fall prevention/safety is still required   -order renewed today LOS (Days) 2 A FACE TO FACE EVALUATION WAS PERFORMED  Darryl [name] T 06/29/2015 10:55 AM

## 2015-06-29 NOTE — IPOC Note (Addendum)
Overall Plan of Care [facility]) Patient Details Name: Darryl [name] MRN: [id number] DOB: 07-30-1950  Admitting Diagnosis: SAH with cognitive deficits  Hospital Problems: Active Problems:   SAH (subarachnoid hemorrhage) (HCC)   Pain   Poor fluid intake   Essential hypertension   Type 2 diabetes mellitus with complication, without long-term current use of insulin (HCC)   Disorientation   Convulsions (HCC)   Acute blood loss anemia   Hypokalemia   Ileus (HCC)   Leukocytosis   Right knee pain   Cognitive safety issue   Impulsiveness   PVC (premature ventricular contraction)     Functional Problem List: Nursing Bladder, Bowel, Medication Management, Pain, Safety, Skin Integrity  PT Balance, Behavior, Endurance, Motor, Nutrition, Pain, Safety  OT Cognition, Balance, Endurance, Motor, Safety, Pain  SLP Cognition  TR   Activity tolerance, functional mobility, balance, cognition, safety, pain, anxiety/stress        Basic ADL's: OT Grooming, Bathing, Dressing, Toileting, Eating     Advanced  ADL's: OT Simple Meal Preparation     Transfers: PT Bed Mobility, Bed to Chair, Car, Occupational psychologist, Research scientist (life sciences): PT Ambulation, Psychologist, prison and probation services, Stairs     Additional Impairments: OT None  SLP Swallowing, Social Cognition   Social Interaction, Memory, Problem Solving, Attention, Awareness  TR  community integration    Anticipated Outcomes Item Anticipated Outcome  Self Feeding independent  Swallowing  Supervision with least restrictive diet    Basic self-care  supervision  Toileting  supervision   Bathroom Transfers supervision  Bowel/Bladder  Continent of bowel and bladder minimal assistance  Transfers  supervision  Locomotion  supervision  Communication     Cognition  Min A-Supervision   Pain  <4  Safety/Judgment  supervision   Therapy Plan: PT Intensity: Minimum of 1-2 x/day ,45 to 90 minutes PT Frequency: 5 out of 7 days PT Duration  Estimated Length of Stay: 12-14 days OT Intensity: Minimum of 1-2 x/day, 45 to 90 minutes OT Frequency: 5 out of 7 days OT Duration/Estimated Length of Stay: 17-19 days SLP Intensity: Minumum of 1-2 x/day, 30 to 90 minutes SLP Frequency: 3 to 5 out of 7 days SLP Duration/Estimated Length of Stay: 12-14 days   TR Duration/ELOS:  2 weeks TR Frequency:  Min 1 time per week >20 minutes        Team Interventions: Nursing Interventions Patient/Family Education, Bladder Management, Bowel Management, Disease Management/Prevention, Pain Management, Medication Management, Skin Care/Wound Management  PT interventions Ambulation/gait training, Balance/vestibular training, Cognitive remediation/compensation, Community reintegration, Discharge planning, Disease management/prevention, DME/adaptive equipment instruction, Functional mobility training, Neuromuscular re-education, Pain management, Patient/family education, Psychosocial support, Stair training, Therapeutic Activities, Therapeutic Exercise, UE/LE Strength taining/ROM, UE/LE Coordination activities, Wheelchair propulsion/positioning  OT Interventions Warden/ranger, Cognitive remediation/compensation, Discharge planning, Therapeutic Activities, Splinting/orthotics, Patient/family education, Neuromuscular re-education, DME/adaptive equipment instruction, Pain management, Community reintegration, Functional mobility training, Psychosocial support, UE/LE Coordination activities, UE/LE Strength taining/ROM, Therapeutic Exercise, Self Care/advanced ADL retraining  SLP Interventions Cognitive remediation/compensation, Cueing hierarchy, Functional tasks, Patient/family education, Therapeutic Activities, Internal/external aids, Environmental controls, Dysphagia/aspiration precaution training  TR Interventions   Recreation/leisure participation, Balance/Vestibular training, functional mobility, therapeutic activities, cognitive  retraining/compensation,  UE/LE strength/coordination, w/c mobility, community reintegration, pt/family education, adaptive equipment instruction/use, discharge planning, psychosocial support  SW/CM Interventions Discharge Planning, Psychosocial Support, Patient/Family Education    Team Discharge Planning: Destination: PT-Home ,OT- Home , SLP-Home Projected Follow-up: PT-Home health PT, 24 hour supervision/assistance, OT-  Home health OT, SLP-Home Health SLP, Outpatient SLP,  24 hour supervision/assistance Projected Equipment Needs: PT-To be determined, OT- 3 in 1 bedside comode, Tub/shower seat, SLP-To be determined Equipment Details: PT- , OT-  Patient/family involved in discharge planning: PT- Patient,  OT-Patient, Family member/caregiver, SLP-Patient unable/family or caregive not available  MD ELOS: 13-16 days Medical Rehab Prognosis:  Excellent Assessment: The patient has been admitted for CIR therapies with the diagnosis of SAH. The team will be addressing functional mobility, strength, stamina, balance, safety, adaptive techniques and equipment, self-care, bowel and bladder mgt, patient and caregiver education, pain mgt, NMR, cognitive remediation, visual-spatial awareness, ego support, community reintegration. Goals have been set at supervision for mobility, self-care, cognition/communication. Progress delayed slightly by left lower ext DVT dx'ed on admit.     Ranelle Oyster, MD, FAAPMR      See Team Conference Notes for weekly updates to the plan of care

## 2015-06-29 NOTE — Progress Notes (Signed)
Physical Therapy Note  Patient Details  Name: Darryl [name] MRN: [id number] Date of Birth: 01-05-1951 Today's Date: 06/29/2015  Patient missed 60 min skilled physical therapy, RN reported administering pain medication prior to session and patient asleep, unable to be aroused and on bed rest. Will f/u per POC.    Kerney Elbe 06/29/2015, 2:39 PM

## 2015-06-29 NOTE — Care Management Note (Signed)
Inpatient Rehabilitation Center Individual Statement of Services  Patient Name:  Darryl [name]  Date:  06/29/2015  Welcome to the Inpatient Rehabilitation Center.  Our goal is to provide you with an individualized program based on your diagnosis and situation, designed to meet your specific needs.  With this comprehensive rehabilitation program, you will be expected to participate in at least 3 hours of rehabilitation therapies Monday-Friday, with modified therapy programming on the weekends.  Your rehabilitation program will include the following services:  Physical Therapy (PT), Occupational Therapy (OT), Speech Therapy (ST), 24 hour per day rehabilitation nursing, Therapeutic Recreaction (TR), Neuropsychology, Case Management (Social Worker), Rehabilitation Medicine, Nutrition Services and Pharmacy Services  Weekly team conferences will be held on Tuesdays to discuss your progress.  Your Social Worker will talk with you frequently to get your input and to update you on team discussions.  Team conferences with you and your family in attendance may also be held.  Expected length of stay: 12-16 days  Overall anticipated outcome: supervision  Depending on your progress and recovery, your program may change. Your Social Worker will coordinate services and will keep you informed of any changes. Your Social Worker's name and contact numbers are listed  below.  The following services may also be recommended but are not provided by the Inpatient Rehabilitation Center:   Driving Evaluations  Home Health Rehabiltiation Services  Outpatient Rehabilitation Services  Vocational Rehabilitation   Arrangements will be made to provide these services after discharge if needed.  Arrangements include referral to agencies that provide these services.  Your insurance has been verified to be:  BCBS Your primary doctor is:  Dr. Parke Simmers  Pertinent information will be shared with your doctor and your insurance  company.  Social Worker:  [place], [place] [phone] or ([phone]   Information discussed with and copy given to patient by: Amada Jupiter, 06/29/2015, 4:01 PM

## 2015-06-29 NOTE — Progress Notes (Signed)
Occupational Therapy Session Note  Patient Details  Name: Darryl [name] MRN: [id number] Date of Birth: August 31, 1950  Today's Date: 06/29/2015 OT Individual Time: 0900-1012 OT Individual Time Calculation (min): 72 min    Short Term Goals: Week 1:  OT Short Term Goal 1 (Week 1): Pt will complete all bathing with min assist sit to stand. OT Short Term Goal 2 (Week 1): Pt will complete LB dressing with min assist sit to stand with AE PRN. OT Short Term Goal 3 (Week 1): Pt will transfer to the walk-in shower with min assist using the RW and shower seat.  OT Short Term Goal 4 (Week 1): Pt will complete toilet transfer with min assist to 3:1 OT Short Term Goal 5 (Week 1): Pt will demonstrate anticipatory awareness by waiting for assistance prior to completing any sit to stand transtions or transfers.   Skilled Therapeutic Interventions/Progress Updates:    Pt resting in enclosure bed upon arrival.  Pt required min encouragement to participate.  Pt states that his "legs hurt" whenever he moves and especially when standing or transferring.  Pt required tot A + 2 (pt=50% for squat pivot transfers) bed<>w/c.  Pt required mod A/min A for sit<>stand and standing balance at sink for hygiene and pulling up pants.  Pt able to stand for approx 20 seconds.  Pt attempted to bathe feet and don socks but was unable to complete.  Pt completed stand pivot transfers to toilet X 2 with mod A/min A.  Pt required multiple rest breaks and min encouragement throughout session.  Focus on activity tolerance, sit<>stand, functional transfers, standing balance, BADL retraining, and safety awareness to increase independence with BADLs.   Therapy Documentation Precautions:  Precautions Precautions: Fall Restrictions Weight Bearing Restrictions: No Pain: Pain Assessment Pain Assessment: Faces Faces Pain Scale: Hurts whole lot Pain Location: Leg Pain Orientation: Right;Left;Posterior;Upper Pain Descriptors / Indicators:  Aching Pain Onset: With Activity Pain Intervention(s): RN made aware;Repositioned  See Function Navigator for Current Functional Status.   Therapy/Group: Individual Therapy  Rich Brave 06/29/2015, 10:14 AM

## 2015-06-29 NOTE — Progress Notes (Signed)
ANTICOAGULATION CONSULT NOTE - Initial Consult  Pharmacy Consult for Xarelto Indication: DVT  No Known Allergies  Patient Measurements: Height:  (180.3 cm) IBW/kg (Calculated) : 75.3 Heparin Dosing Weight:   Vital Signs: Temp: 98.5 F (36.9 C) (01/27 1355) Temp Source: Oral (01/27 1355) BP: 146/74 mmHg (01/27 1355) Pulse Rate: 75 (01/27 1355)  Labs:  Recent Labs  06/28/15 1030 06/28/15 1620 06/29/15 0553  HGB 11.6*  --   --   HCT 32.8*  --   --   PLT 393  --   --   CREATININE 0.88 0.94 0.87    CrCl cannot be calculated (Unknown ideal weight.).   Medical History: Past Medical History  Diagnosis Date  . Hypertension   . Seizures (HCC)   . Diabetes mellitus without complication (HCC)     Medications:  Scheduled:  . diclofenac sodium  2 g Topical QID  . feeding supplement (GLUCERNA SHAKE)  237 mL Oral BID BM  . ferrous fumarate-b12-vitamic C-folic acid  1 capsule Oral BID  . insulin aspart  0-15 Units Subcutaneous TID WC  . insulin aspart  0-5 Units Subcutaneous QHS  . lamoTRIgine  100 mg Oral BID  . levETIRAcetam  500 mg Oral BID  . losartan  25 mg Oral Daily  . potassium chloride  20 mEq Oral TID  . Rivaroxaban  15 mg Oral BID WC  . senna-docusate  2 tablet Oral BID  . simvastatin  20 mg Oral q1800  . [START ON 07/01/2015] Vitamin D (Ergocalciferol)  50,000 Units Oral Q Sun    Assessment: 64yo admitted to rehab on 1/26 after admission 1/8-1/25 for [facility], s/p coiling.  Pt found to have LLE acute DVT (femoral prox/mid/distal and prox popliteal).   I have discussed with Marissa Nestle PA, and neurosurgery has okayed treatment dose with Xarelto.    Cr 0.87 Hg 11.6 and pltc wnl  Goal of Therapy:  Treatment of DVT Monitor platelets by anticoagulation protocol: Yes   Plan:  Xarelto  bid x 21 days, then  daily Watch closely for s/s of bleeding Education of patient with family present  Marisue Humble, PharmD Clinical Pharmacist [facility]

## 2015-06-29 NOTE — Progress Notes (Signed)
Speech Language Pathology Daily Session Note  Patient Details  Name: Darryl [name] MRN: [id number] Date of Birth: 01-04-1951  Today's Date: 06/29/2015 SLP Individual Time: 4098-1191 SLP Individual Time Calculation (min): 60 min  Short Term Goals: Week 1: SLP Short Term Goal 1 (Week 1): Patient will orient to time, place and situation with Mod A question and verbal cues.  SLP Short Term Goal 2 (Week 1): Patient will demonstrate sustained attention to functional tasks for 5 minutes with Mod A verbal cues for redirection. SLP Short Term Goal 3 (Week 1): Patient will identify 1 physical and 1 cognitive deficit with Mod A verbal and question cues.  SLP Short Term Goal 4 (Week 1): Patient will consume current diet with minimal overt s/s of aspiration with Mod A verbal cues for use of swallowing compensatory strategies.  SLP Short Term Goal 5 (Week 1): Patient will consume trials of Dys. 2 textures and demonstrate efficent mastication with minimal oral residue with Min A verbal cues to self-monitor and correct oral residue over 2 consecutive sessions prior to upgrade.  SLP Short Term Goal 6 (Week 1): Patient will consume trials of thin liquids via cup with minimal overt s/s of aspiration with Min A verbal cues for use of swallowing compensatory strategies over 2 consecutive sessions prior to repeat MBS for possible upgrade.   Skilled Therapeutic Interventions: Skilled treatment session focused on cognitive goals. Upon arrival, patient was awake while supine in enclosure bed.  Patient requested to get out of bed, however, patient was unable to sit-up for more than 30-60 second increments due to reports of dizziness and thigh pain. RN and physician made aware.  Patient with initial language of confusion, however, was easily redirected and reported "I must be talking about a dream." SLP facilitated session by administering the MoCA. Patient scored 11/22 points with a score of 18 or above considered normal.  Patient demonstrated deficits in the areas of attention, recall and orientation. Patient left supine in enclosure bed with all needs within reach. Continue with current plan of care.    Function:  Cognition Comprehension Comprehension assist level: Understands basic 50 - 74% of the time/ requires cueing 25 - 49% of the time  Expression   Expression assist level: Expresses basic 50 - 74% of the time/requires cueing 25 - 49% of the time. Needs to repeat parts of sentences.  Social Interaction Social Interaction assist level: Interacts appropriately 50 - 74% of the time - May be physically or verbally inappropriate.  Problem Solving Problem solving assist level: Solves basic 25 - 49% of the time - needs direction more than half the time to initiate, plan or complete simple activities  Memory Memory assist level: Recognizes or recalls less than 25% of the time/requires cueing greater than 75% of the time    Pain Pain Assessment Pain Assessment: Faces Faces Pain Scale: Hurts whole lot Pain Type: Acute pain Pain Location: Leg (DVT lt. thigh) Pain Orientation: Left Pain Descriptors / Indicators: Aching;Tender;Sharp Pain Onset: On-going Patients Stated Pain Goal: 2 Pain Intervention(s): Medication (See eMAR);Repositioned;Rest Multiple Pain Sites: No  Therapy/Group: Individual Therapy  Latajah Thuman 06/29/2015, 4:14 PM

## 2015-06-29 NOTE — Progress Notes (Signed)
VASCULAR LAB PRELIMINARY  PRELIMINARY  PRELIMINARY  PRELIMINARY  Bilateral lower extremity venous Doppler completed.     Left: DVT noted in the femoral proximal,mid,distal and proximal popliteal vein.  No evidence of superficial thrombosis.  No Baker's cyst.  Right:  No evidence of DVT, superficial thrombosis, or Baker's cyst.   Result given to Chales Abrahams, RN at 11:33 AM.  Jenetta Loges, RVT, RDMS 06/29/2015, 11:36 AM

## 2015-06-30 ENCOUNTER — Inpatient Hospital Stay (HOSPITAL_COMMUNITY): Payer: BC Managed Care – PPO | Admitting: Occupational Therapy

## 2015-06-30 ENCOUNTER — Inpatient Hospital Stay (HOSPITAL_COMMUNITY): Payer: BC Managed Care – PPO | Admitting: Speech Pathology

## 2015-06-30 LAB — GLUCOSE, CAPILLARY
Glucose-Capillary: 148 mg/dL — ABNORMAL HIGH (ref 65–99)
Glucose-Capillary: 110 mg/dL — ABNORMAL HIGH (ref 65–99)
Glucose-Capillary: 148 mg/dL — ABNORMAL HIGH (ref 65–99)
Glucose-Capillary: 154 mg/dL — ABNORMAL HIGH (ref 65–99)

## 2015-06-30 NOTE — Discharge Instructions (Addendum)
Inpatient Rehab Discharge Instructions  [facility] Discharge date and time:    Activities/Precautions/ Functional Status: Activity: no lifting, driving, or strenuous exercise till cleared by MD.  Diet:   Finely chopped foods. Diabetic diet. Use supplements three times a day if intake poor.  Wound Care: keep wound clean and dry   Functional status:  ___ No restrictions     ___ Walk up steps independently _X__ 24/7 supervision/assistance   ___ Walk up steps with assistance ___ Intermittent supervision/assistance  ___ Bathe/dress independently ___ Walk with walker     _X__ Bathe/dress with assistance ___ Walk Independently    ___ Shower independently ___ Walk with assistance    ___ Shower with assistance _X__ No alcohol     ___ Return to work/school ________   COMMUNITY REFERRALS UPON DISCHARGE:    Home Health:   PT    OT     ST    RN                       Agency:  At [facility] (of [place])    Phone: [phone]   Medical Equipment/Items Ordered:  Wheelchair, cushion, walker, 3n1 commode and tub seat                                                      Agency/Supplier:  [facility] @ [phone]     Special Instructions: 1. Check blood sugars twice a day for now. Contact your doctor if blood  Sugars are running over 150 consistently.  2. Toilet every 4 hours while awake.    My questions have been answered and I understand these instructions. I will adhere to these goals and the provided educational materials after my discharge from the hospital.  Patient/Caregiver Signature _______________________________ Date __________  Clinician Signature _______________________________________ Date __________  Please bring this form and your medication list with you to all your follow-up doctor's appointments.    -----------------------------------------------    Information on my medicine - XARELTO (rivaroxaban)  This medication education was reviewed with me  or my healthcare representative as part of my discharge preparation.    WHY WAS XARELTO PRESCRIBED FOR YOU? Xarelto was prescribed to treat blood clots that may have been found in the veins of your legs (deep vein thrombosis) or in your lungs (pulmonary embolism) and to reduce the risk of them occurring again.  What do you need to know about Xarelto? The starting dose is one 15 mg tablet taken TWICE daily with food for the FIRST 21 DAYS then on 07/20/2015 the dose is changed to one 20 mg tablet taken ONCE A DAY with your evening meal.  DO NOT stop taking Xarelto without talking to the health care provider who prescribed the medication.  Refill your prescription for 20 mg tablets before you run out.  After discharge, you should have regular check-up appointments with your healthcare provider that is prescribing your Xarelto.  In the future your dose may need to be changed if your kidney function changes by a significant amount.  What do you do if you miss a dose? If you are taking Xarelto TWICE DAILY and you miss a dose, take it as soon as you remember. You may take two 15 mg tablets (total 30 mg) at the same time then resume your  regularly scheduled 15 mg twice daily the next day.  If you are taking Xarelto ONCE DAILY and you miss a dose, take it as soon as you remember on the same day then continue your regularly scheduled once daily regimen the next day. Do not take two doses of Xarelto at the same time.   Important Safety Information Xarelto is a blood thinner medicine that can cause bleeding. You should call your healthcare provider right away if you experience any of the following: ? Bleeding from an injury or your nose that does not stop. ? Unusual colored urine (red or dark brown) or unusual colored stools (red or black). ? Unusual bruising for unknown reasons. ? A serious fall or if you hit your head (even if there is no bleeding).  Some medicines may interact with Xarelto  and might increase your risk of bleeding while on Xarelto. To help avoid this, consult your healthcare provider or pharmacist prior to using any new prescription or non-prescription medications, including herbals, vitamins, non-steroidal anti-inflammatory drugs (NSAIDs) and supplements.  This website has more information on Xarelto: [email or web address].br.

## 2015-06-30 NOTE — Progress Notes (Signed)
Darryl [name] is a 65 y.o. male [birth year] [id number]  Subjective: No new complaints. No new problems. Calm this AM within enclosed bed.  Objective: Vital signs in last 24 hours: Temp:  [98.4 F (36.9 C)-98.5 F (36.9 C)] 98.4 F (36.9 C) (01/28 0509) Pulse Rate:  [73-75] 73 (01/28 0509) Resp:  [16] 16 (01/28 0509) BP: (133-146)/(72-74) 133/72 mmHg (01/28 0509) SpO2:  [99 %-100 %] 99 % (01/28 0509) Weight change:  Last BM Date: 06/29/15  Intake/Output from previous day: 01/27 0701 - 01/28 0700 In: 600 [P.O.:600] Out: 200 [Urine:200]  Physical Exam General: No apparent distress    Lungs: Normal effort.  Neurological: No new neurological deficits   Lab Results: BMET    Component Value Date/Time   NA 138 06/29/2015 0553   K 3.3* 06/29/2015 0553   CL 106 06/29/2015 0553   CO2 22 06/29/2015 0553   GLUCOSE 124* 06/29/2015 0553   BUN 6 06/29/2015 0553   CREATININE 0.87 06/29/2015 0553   CALCIUM 8.7* 06/29/2015 0553   GFRNONAA >60 06/29/2015 0553   GFRAA >60 06/29/2015 0553   CBC    Component Value Date/Time   WBC 10.5 06/28/2015 1030   WBC 7.1 04/24/2009 1501   RBC 4.40 06/28/2015 1030   RBC 4.75 04/24/2009 1501   HGB 11.6* 06/28/2015 1030   HGB 12.3* 04/24/2009 1501   HCT 32.8* 06/28/2015 1030   HCT 36.7* 04/24/2009 1501   PLT 393 06/28/2015 1030   PLT 242 04/24/2009 1501   MCV 74.5* 06/28/2015 1030   MCV 77.3* 04/24/2009 1501   MCH 26.4 06/28/2015 1030   MCH 25.9* 04/24/2009 1501   MCHC 35.4 06/28/2015 1030   MCHC 33.5 04/24/2009 1501   RDW 13.7 06/28/2015 1030   RDW 14.6 04/24/2009 1501   LYMPHSABS 1.3 06/28/2015 1030   LYMPHSABS 2.1 04/24/2009 1501   MONOABS 0.7 06/28/2015 1030   MONOABS 0.4 04/24/2009 1501   EOSABS 0.1 06/28/2015 1030   EOSABS 0.2 04/24/2009 1501   BASOSABS 0.0 06/28/2015 1030   BASOSABS 0.0 04/24/2009 1501   CBG's (last 3):   Recent Labs  06/29/15 2100 06/30/15 0642 06/30/15 1138  GLUCAP 162* 148* 110*   LFT's Lab  Results  Component Value Date   ALT 17 06/28/2015   AST 20 06/28/2015   ALKPHOS 46 06/28/2015   BILITOT 0.8 06/28/2015    Studies/Results: No results found.  Medications:  I have reviewed the patient's current medications. Scheduled Medications: . diclofenac sodium  2 g Topical QID  . feeding supplement (GLUCERNA SHAKE)  237 mL Oral BID BM  . ferrous fumarate-b12-vitamic C-folic acid  1 capsule Oral BID  . insulin aspart  0-15 Units Subcutaneous TID WC  . insulin aspart  0-5 Units Subcutaneous QHS  . lamoTRIgine  100 mg Oral BID  . levETIRAcetam  500 mg Oral BID  . losartan  25 mg Oral Daily  . potassium chloride  20 mEq Oral TID  . Rivaroxaban  15 mg Oral BID WC  . senna-docusate  2 tablet Oral BID  . simvastatin  20 mg Oral q1800  . [START ON 07/01/2015] Vitamin D (Ergocalciferol)  50,000 Units Oral Q Sun   PRN Medications: acetaminophen, alum & mag hydroxide-simeth, bisacodyl, diphenhydrAMINE, guaiFENesin-dextromethorphan, methocarbamol, oxyCODONE, prochlorperazine **OR** prochlorperazine **OR** prochlorperazine, RESOURCE THICKENUP CLEAR, sodium phosphate, traZODone  Assessment/Plan: Active Problems:   SAH (subarachnoid hemorrhage) (HCC)   Pain   Poor fluid intake   Essential hypertension   Type 2 diabetes mellitus with complication, without  long-term current use of insulin (HCC)   Disorientation   Convulsions (HCC)   Acute blood loss anemia   Hypokalemia   Ileus (HCC)   Leukocytosis   Right knee pain   Cognitive safety issue   Impulsiveness   PVC (premature ventricular contraction)  1. 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 Appears a little less distracted today  2. DVT L fem dx on doppler 06/29/15 - after d/w NSurg, full dose Xarelto initiated for same (3 weeks out from [facility]) 3. Pain Management: oxycodone prn for headaches and back/leg  pain.  -see #15 below 4. Mood: LCSW to follow for evaluation and support.  5. Neuropsych: This patient is not capable of making decisions on his own behalf. -continue to provide appropriate environment for sleep and communication -avoid overstimulating -check sleep chart 6. Skin/Wound Care: routine pressure relief measures.  7. Fluids/Electrolytes/Nutrition: Has been poor in part due to lethargy. Added nutritional supplements. Monitor for now. . -follow up labs prn 8. HTN: Monitor BP tid  -resumed low dose Cozaar. Titrate medications as indicated. Fair control at present  9. DM type 2: Monitor BS ac/hs basis. Po intake has been variable due to lethargy and cognitive issues. Wife encouraged to bring pureed foods from home.  -continue to hold metformin and use SSI for now. sugars remain under reasonable control at present 10. Seizures disorder: Was on lamictal bid prior to admission. On keppra bid which has been changed to PO 11. ABLA: continue iron supplement.. Labs all pending today 12. Mild ileus: two Senna S bid. Use enema prn.  13. Hypokalemia: Supplement to avoid recurrence of ileus.  14.  Chronic right knee pain/leg pain: Off NSAIDs.  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 Pain remains vague/inconsistent due to cognitive status. Will continue to follow 16. PVCs: Negative cardiac work up 05/2015 17. Safety: enclosure bed for fall prevention/safety is still required -order renewed today  Length of stay, days: 3   Avrum Kimball A. Felicity Coyer, MD 06/30/2015, 12:01 PM

## 2015-06-30 NOTE — Progress Notes (Signed)
Speech Language Pathology Daily Session Note  Patient Details  Name: Darryl [name] MRN: [id number] Date of Birth: 05-21-1951  Today's Date: 06/30/2015 SLP Individual Time: 1345-1425 SLP Individual Time Calculation (min): 40 min  Short Term Goals: Week 1: SLP Short Term Goal 1 (Week 1): Patient will orient to time, place and situation with Mod A question and verbal cues.  SLP Short Term Goal 2 (Week 1): Patient will demonstrate sustained attention to functional tasks for 5 minutes with Mod A verbal cues for redirection. SLP Short Term Goal 3 (Week 1): Patient will identify 1 physical and 1 cognitive deficit with Mod A verbal and question cues.  SLP Short Term Goal 4 (Week 1): Patient will consume current diet with minimal overt s/s of aspiration with Mod A verbal cues for use of swallowing compensatory strategies.  SLP Short Term Goal 5 (Week 1): Patient will consume trials of Dys. 2 textures and demonstrate efficent mastication with minimal oral residue with Min A verbal cues to self-monitor and correct oral residue over 2 consecutive sessions prior to upgrade.  SLP Short Term Goal 6 (Week 1): Patient will consume trials of thin liquids via cup with minimal overt s/s of aspiration with Min A verbal cues for use of swallowing compensatory strategies over 2 consecutive sessions prior to repeat MBS for possible upgrade.   Skilled Therapeutic Interventions: Skilled treatment session focused on cognitive and dysphagia goals. Patient is currently on bedrest and upon arrival patient was attempting to sit EOB with family to consume meal. Patient required total A to sit upright but instantly returned to supine due to reports of dizziness. Patient was repositioned to sitting upright within the enclosure bed. Patient consumed Dys. 1 textures with nectar-thick liquids without overt s/s of aspiration but demonstrated what appeared to be a delayed swallow initiation and required Mod verbal cues for use of multiple  swallows. Patient required total A for orientation to time, place and situation and demonstrated increased language of confusion today. Patient left in enclosure bed with all needs within reach. Continue with current plan of care.    Function:  Eating Eating     Eating Assist Level: Set up assist for;More than reasonable amount of time;Supervision or verbal cues;Helper checks for pocketed food;Helper feeds patient   Eating Set Up Assist For: Opening containers       Cognition Comprehension Comprehension assist level: Understands basic 50 - 74% of the time/ requires cueing 25 - 49% of the time  Expression   Expression assist level: Expresses basic 50 - 74% of the time/requires cueing 25 - 49% of the time. Needs to repeat parts of sentences.  Social Interaction Social Interaction assist level: Interacts appropriately 50 - 74% of the time - May be physically or verbally inappropriate.  Problem Solving Problem solving assist level: Solves basic 25 - 49% of the time - needs direction more than half the time to initiate, plan or complete simple activities  Memory Memory assist level: Recognizes or recalls less than 25% of the time/requires cueing greater than 75% of the time    Pain Intermittent grimacing and reports of pain in legs when repositioning himself  Therapy/Group: Individual Therapy  Olayinka Gathers 06/30/2015, 4:29 PM

## 2015-07-01 ENCOUNTER — Encounter (HOSPITAL_COMMUNITY): Payer: Self-pay | Admitting: Internal Medicine

## 2015-07-01 ENCOUNTER — Inpatient Hospital Stay (HOSPITAL_COMMUNITY): Payer: BC Managed Care – PPO

## 2015-07-01 ENCOUNTER — Inpatient Hospital Stay (HOSPITAL_COMMUNITY): Payer: BC Managed Care – PPO | Admitting: Occupational Therapy

## 2015-07-01 DIAGNOSIS — I82412 Acute embolism and thrombosis of left femoral vein: Secondary | ICD-10-CM

## 2015-07-01 HISTORY — DX: Acute embolism and thrombosis of left femoral vein: I82.412

## 2015-07-01 LAB — GLUCOSE, CAPILLARY
Glucose-Capillary: 144 mg/dL — ABNORMAL HIGH (ref 65–99)
Glucose-Capillary: 111 mg/dL — ABNORMAL HIGH (ref 65–99)
Glucose-Capillary: 163 mg/dL — ABNORMAL HIGH (ref 65–99)
Glucose-Capillary: 115 mg/dL — ABNORMAL HIGH (ref 65–99)

## 2015-07-01 MED ORDER — ERGOCALCIFEROL 8000 UNIT/ML PO SOLN
50000.0000 [IU] | ORAL | Status: DC
  Administered 2015-07-01 – 2015-07-22 (×4): 50000 [IU] via ORAL
  Filled 2015-07-01 (×4): qty 6.25

## 2015-07-01 NOTE — Progress Notes (Signed)
Darryl [name] is a 65 y.o. male [birth year] [id number]  Subjective: No new complaints. No new problems. Calm this AM within enclosed bed. Admits to being "confused sometimes"  Objective: Vital signs in last 24 hours: Temp:  [97.9 F (36.6 C)-98.2 F (36.8 C)] 98.2 F (36.8 C) (01/29 0500) Pulse Rate:  [64-80] 64 (01/29 0500) Resp:  [20] 20 (01/29 0500) BP: (130-139)/(90-91) 139/91 mmHg (01/29 0500) SpO2:  [100 %] 100 % (01/29 0500) Weight change:  Last BM Date: 06/29/15  Intake/Output from previous day: 01/28 0701 - 01/29 0700 In: 240 [P.O.:240] Out: 200 [Urine:200]  Physical Exam General: No apparent distress   Appropriate verbal interaction this AM Lungs: Normal effort.  Neurological: No new neurological deficits   Lab Results: BMET    Component Value Date/Time   NA 138 06/29/2015 0553   K 3.3* 06/29/2015 0553   CL 106 06/29/2015 0553   CO2 22 06/29/2015 0553   GLUCOSE 124* 06/29/2015 0553   BUN 6 06/29/2015 0553   CREATININE 0.87 06/29/2015 0553   CALCIUM 8.7* 06/29/2015 0553   GFRNONAA >60 06/29/2015 0553   GFRAA >60 06/29/2015 0553   CBC    Component Value Date/Time   WBC 10.5 06/28/2015 1030   WBC 7.1 04/24/2009 1501   RBC 4.40 06/28/2015 1030   RBC 4.75 04/24/2009 1501   HGB 11.6* 06/28/2015 1030   HGB 12.3* 04/24/2009 1501   HCT 32.8* 06/28/2015 1030   HCT 36.7* 04/24/2009 1501   PLT 393 06/28/2015 1030   PLT 242 04/24/2009 1501   MCV 74.5* 06/28/2015 1030   MCV 77.3* 04/24/2009 1501   MCH 26.4 06/28/2015 1030   MCH 25.9* 04/24/2009 1501   MCHC 35.4 06/28/2015 1030   MCHC 33.5 04/24/2009 1501   RDW 13.7 06/28/2015 1030   RDW 14.6 04/24/2009 1501   LYMPHSABS 1.3 06/28/2015 1030   LYMPHSABS 2.1 04/24/2009 1501   MONOABS 0.7 06/28/2015 1030   MONOABS 0.4 04/24/2009 1501   EOSABS 0.1 06/28/2015 1030   EOSABS 0.2 04/24/2009 1501   BASOSABS 0.0 06/28/2015 1030   BASOSABS 0.0 04/24/2009 1501   CBG's (last 3):    Recent Labs  06/30/15 1634  06/30/15 2044 07/01/15 0711  GLUCAP 148* 154* 144*   LFT's Lab Results  Component Value Date   ALT 17 06/28/2015   AST 20 06/28/2015   ALKPHOS 46 06/28/2015   BILITOT 0.8 06/28/2015    Studies/Results: No results found.  Medications:  I have reviewed the patient's current medications. Scheduled Medications: . diclofenac sodium  2 g Topical QID  . ergocalciferol  50,000 Units Oral Once per day on Sun  . feeding supplement (GLUCERNA SHAKE)  237 mL Oral BID BM  . ferrous fumarate-b12-vitamic C-folic acid  1 capsule Oral BID  . insulin aspart  0-15 Units Subcutaneous TID WC  . insulin aspart  0-5 Units Subcutaneous QHS  . lamoTRIgine  100 mg Oral BID  . levETIRAcetam  500 mg Oral BID  . losartan  25 mg Oral Daily  . potassium chloride  20 mEq Oral TID  . Rivaroxaban  15 mg Oral BID WC  . senna-docusate  2 tablet Oral BID  . simvastatin  20 mg Oral q1800   PRN Medications: acetaminophen, alum & mag hydroxide-simeth, bisacodyl, diphenhydrAMINE, guaiFENesin-dextromethorphan, methocarbamol, oxyCODONE, prochlorperazine **OR** prochlorperazine **OR** prochlorperazine, RESOURCE THICKENUP CLEAR, sodium phosphate, traZODone  Assessment/Plan: Active Problems:   SAH (subarachnoid hemorrhage) (HCC)   Pain   Poor fluid intake   Essential hypertension  Type 2 diabetes mellitus with complication, without long-term current use of insulin (HCC)   Disorientation   Convulsions (HCC)   Acute blood loss anemia   Hypokalemia   Ileus (HCC)   Leukocytosis   Right knee pain   Cognitive safety issue   Impulsiveness   PVC (premature ventricular contraction)  1. 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 Appears a little less distracted today  2. DVT L fem dx on doppler 06/29/15 - after d/w NSurg, full dose Xarelto initiated for same (3 weeks out from [facility]) 3. Pain  Management: oxycodone prn for headaches and back/leg pain.  -see #15 below 4. Mood: LCSW to follow for evaluation and support.  5. Neuropsych: This patient is not capable of making decisions on his own behalf. -continue to provide appropriate environment for sleep and communication -avoid overstimulating -check sleep chart 6. Skin/Wound Care: routine pressure relief measures.  7. Fluids/Electrolytes/Nutrition: Has been poor in part due to lethargy. Added nutritional supplements. Monitor for now.  -follow up labs prn 8. HTN: Monitor BP tid  -resumed low dose Cozaar. Titrate medications as indicated. Fair control at present  9. DM type 2: Monitor BS ac/hs basis. Po intake has been variable due to lethargy and cognitive issues. Wife encouraged to bring pureed foods from home.  -continue to hold metformin and use SSI for now. sugars remain under reasonable control at present 10. Seizures disorder: Was on lamictal bid prior to admission. On keppra bid which has been changed to PO 11. ABLA: continue iron supplement.. Labs all pending today 12. Mild ileus:two Senna S bid. Use enema prn.  13. Hypokalemia: Supplement to avoid recurrence of ileus.  14.  Chronic right knee pain/leg pain: Off NSAIDs.  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 Pain remains vague/inconsistent due to cognitive status. Will continue to follow 16. PVCs: Negative cardiac work up 05/2015 17. Safety: enclosure bed for fall prevention/safety is still required -order renewed today  Length of stay, days: 4   Mccormick Macon A. Felicity Coyer, MD 07/01/2015, 10:05 AM

## 2015-07-01 NOTE — Plan of Care (Signed)
Problem: RH BLADDER ELIMINATION Goal: RH STG MANAGE BLADDER WITH ASSISTANCE STG Manage Bladder With moderate Assistance  Outcome: Not Progressing Patient incontinent at HS.  Removed brief and voided in his shirt and all over his bed.

## 2015-07-01 NOTE — Plan of Care (Signed)
Problem: RH BLADDER ELIMINATION Goal: RH STG MANAGE BLADDER WITH ASSISTANCE STG Manage Bladder With moderate Assistance  Outcome: Not Progressing Patient incontinent of bladder.

## 2015-07-01 NOTE — Progress Notes (Signed)
Social Work  Social Work Assessment and Plan  Patient Details  Name: Darryl [name] MRN: [id number] Date of Birth: 1951/05/18  Today's Date: 06/29/2015  Problem List:  Patient Active Problem List   Diagnosis Date Noted  . Acute deep vein thrombosis (DVT) of left femoral vein (HCC) 07/01/2015  . Pain   . Poor fluid intake   . Essential hypertension   . Type 2 diabetes mellitus with complication, without long-term current use of insulin (HCC)   . Disorientation   . Convulsions (HCC)   . Acute blood loss anemia   . Hypokalemia   . Ileus (HCC)   . Leukocytosis   . Right knee pain   . Cognitive safety issue   . Impulsiveness   . PVC (premature ventricular contraction)   . Abdominal discomfort   . SAH (subarachnoid hemorrhage) (HCC) 06/11/2015  . Emesis   . Subarachnoid hemorrhage (HCC) 06/10/2015  . Hypertension   . Diabetes mellitus without complication [facility])    Past Medical History:  Past Medical History  Diagnosis Date  . Hypertension   . Seizures (HCC)   . Diabetes mellitus without complication (HCC)   . Acute deep vein thrombosis (DVT) of left femoral vein (HCC) 07/01/2015   Past Surgical History:  Past Surgical History  Procedure Laterality Date  . Knee surgery    . Cholecystectomy    . Radiology with anesthesia N/A 06/11/2015    Procedure: RADIOLOGY WITH ANESTHESIA;  Surgeon: Lisbeth Renshaw, MD;  Location: [facility] OR;  Service: Radiology;  Laterality: N/A;   Social History:  reports that he quit smoking about 27 years ago. He does not have any smokeless tobacco history on file. He reports that he does not drink alcohol or use illicit drugs.  Family / Support Systems Marital Status: Married How Long?: 37 yrs Patient Roles: Spouse, Parent, Other (Comment) (employee) Spouse/Significant Other: wife, [name] @ [phone] or [phone] Children: daughter, [name] @ (C) [phone];  daughter, [name] ([place]);  daughter, [name]);   son, [name]) Other Supports: wife's brother, nephew and neice [name]) are living locally and also very supportive. Anticipated Caregiver: [name], wife Ability/Limitations of Caregiver: [name] statess she has fibromyalgia and will not be able to do any physical assistance of her patient Caregiver Availability: 24/7 Family Dynamics: Wife and family very supportive and engaged.  Wife a little tearful during assessment interview as she has to assist with info pt is attempting to provide.  Visibly concerned about husband's confusion.  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 Legal Hisotry/Current Legal Issues: None Guardian/Conservator: None - per MD, pt not capable of making decisions on his own behalf - defer to wife.   Abuse/Neglect Physical Abuse: Denies Verbal Abuse: Denies Sexual Abuse: Denies Exploitation of patient/patient's resources: Denies Self-Neglect: Denies  Emotional Status Pt's affect, behavior adn adjustment status: Pt lying in enclosure bed.  Attempts to answer questions posed, however, not oriented to place or situation.  Reports he is in [place] (wife notes he grew up in [place]) and states his wife is across the hall (not).  He does not appear to be in any distress as he is laying in the bed.  Wife notes that he has become " a little worked up" at times during  hospitalization. Will monitor mood as cognition and communication improve.  Will refer to neuropsychology when appropriate. Recent Psychosocial Issues: None Pyschiatric History: Wife reports that pt has had periods of "anxiety over the years...usually because of his job..." Has taken medication, however, never any  counseling. Substance Abuse History: None  Patient / Family Perceptions, Expectations & Goals Pt/Family understanding of illness & functional limitations: Wife with good understanding of pt's hemorrhage and currently cognitive presentation.  Able to verbalize that she is aware recovery "will be a long road."  Pt not oriented to situation. Premorbid pt/family roles/activities: Pt and wife very active with work, church and family - completely independent. Anticipated changes in roles/activities/participation: Pt will require 24/7 supervision at a minimum.  Wife to assume primary caregiver role with support of children. Pt/family expectations/goals: Wfie hopeful pt will reach functional level that she can manage at home.  Community Resources [employer]: None Premorbid Home Care/DME Agencies: None Transportation available at discharge: yes Resource referrals recommended: Neuropsychology, Support group (specify)  Discharge Planning Living Arrangements: Spouse/significant other Support Systems: Spouse/significant other, Children, Other relatives, Friends/neighbors, Psychologist, clinical community Type of Residence: Private residence Insurance Resources: Media planner (specify) Herbalist) Financial Resources: Employment Surveyor, quantity Screen Referred: No Living Expenses: Database administrator Management: Patient Does the patient have any problems obtaining your medications?: No Home Management: pt and wife Patient/Family Preliminary Plans: Pt to return home with wife as Pharmacologist caregiver.  Other family to provde assist as they are able. 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  Completely independent and working full-time PTA.  Supportive wife and family, however, with concerns about how much assist pt may require upon CIR d/c.  Wife visibly concerned for his recovery  and of newly found DVT.  Pt with significant cognitive impairments and currently requiring enclosure bed.  Primary focus at this point is family support.  Will follow for d/c planning and support needs.  Brennan Litzinger 06/29/2015, 1:48 PM

## 2015-07-01 NOTE — Progress Notes (Signed)
Occupational Therapy Session Note  Patient Details  Name: Darryl [name] MRN: [id number] Date of Birth: 1951/04/05  Today's Date: 07/01/2015 OT Individual Time: 0800-0900 OT Individual Time Calculation (min): 60 min    Short Term Goals: Week 1:  OT Short Term Goal 1 (Week 1): Pt will complete all bathing with min assist sit to stand. OT Short Term Goal 2 (Week 1): Pt will complete LB dressing with min assist sit to stand with AE PRN. OT Short Term Goal 3 (Week 1): Pt will transfer to the walk-in shower with min assist using the RW and shower seat.  OT Short Term Goal 4 (Week 1): Pt will complete toilet transfer with min assist to 3:1 OT Short Term Goal 5 (Week 1): Pt will demonstrate anticipatory awareness by waiting for assistance prior to completing any sit to stand transtions or transfers.   Skilled Therapeutic Interventions/Progress Updates:    Pt seen for OT ADL bathing and dressing task and cognitive re-training. Pt in supine upon arrival. Spoke with RN prior to tx session and she stated that pt was still on bed rest. Pt voiced 6/10 headache upon arrival, RN made aware and medication administered. Pt's brief soiled upon arrival. Bathing/ dressing completed in supine with pt requiring min-mod VCs to wash entire body as he would perseverate on one area. He dressed in supine, rolling to pull up pants/ underwear. Pt orientated to place, situation, and month, however did not know year or day of the week. When asked later in session, pt incorrectly identified year and day again.  Cognitive task completed with pt being shown picture cards and required to distinguish differences btwn pictures. He was able to do this with none-min cuing. Then had pt describe what was on picture card for therapist to guess object. He required max cuing to complete this, with some descriptions given by pt having no relation to what was on picture card, though he could correctly identify picture on card. Pt left in supine  at end of session, all needs in reach, vail bed closed. Pt with varying levels of orientation throughout session. He was able to ask appropriate questions regarding bedrest orders however, also reported that he saw a bird flying in the room the other day, a toothbrush was in his way of rolling in bed, and that "the Indians were coming, but he would make it through". Pt re-oriented when appropriate  Therapy Documentation Precautions:  Precautions Precautions: Fall Restrictions Weight Bearing Restrictions: No Pain: Pain Assessment Pain Score: 6  Pain Descriptors / Indicators: Headache Pain Intervention(s): Medication (See eMAR);RN made aware;Ambulation/increased activity  See Function Navigator for Current Functional Status.   Therapy/Group: Individual Therapy  Lewis, Yamato Kopf C 07/01/2015, 6:50 AM

## 2015-07-01 NOTE — Progress Notes (Signed)
Physical Therapy Session Note  Patient Details  Name: Darryl [name] MRN: [id number] Date of Birth: 16-Jul-1950  Today's Date: 07/01/2015 PT Individual Time: 1300-1420 PT Individual Time Calculation (min): 80 min   Short Term Goals: Week 1:  PT Short Term Goal 1 (Week 1): Patient will ambulate 50 ft using LRAD with assist of 1 person.  PT Short Term Goal 2 (Week 1): Patient will transfer bed <> wheelchair with mod A x 1.  PT Short Term Goal 3 (Week 1): Patient will negotiate up/down 12 stairs with assist of 1 person.  PT Short Term Goal 4 (Week 1): Patient will propel wheelchair x 150 ft with supervision for strengthening/endurance.  PT Short Term Goal 5 (Week 1): Patient will maintain standing balance during functional task x 3 min with assist of 1 person.   Skilled Therapeutic Interventions/Progress Updates:    session focused on addressing overall functional mobility while addressing awareness, safety, attention, and memory.   Pt performed multiple transfers with max A +2 stand and squat pivot techniques with cues for hand placement and technique and extra time for initiation. Pt perseverating throughout session on wanting to lay down and rest, so took therapeutic rest breaks in supine on mat between therapeutic and neuro re-ed activities. Pt ranged from supervision to mod assist for bed mobility on flat mat surface. Nustep for reciprocal movement pattern retraining, attention, and overall strengthening and endurance x 5 min on level 4. Neuro re-ed for trunk/postural control and dynamic sitting balance activities including matching playing cards to promote anterior weightshift and shooting/catching basketball with supervision to total assist for balance due to pt wanting to lean back to "go to sleep for a min." Gait with +2 assist x 10-15' x 2 trials with cues for upright posture, increased step length, and postural control with visual targets to help motivate patient to continue. W/c mobility  training part of the way back to his room at supervision level with cues for technique and obstacle negotiation. End of session returned back to secured enclosure bed for safety with call bell in reach. Pt continues to demonstrate poor insight and awareness of deficits. Education and reorientation provided throughout session as appropriate.   Therapy Documentation Precautions:  Precautions Precautions: Fall Restrictions Weight Bearing Restrictions: No   Vital Signs: BP in supine 136/86 mmHg BP in sitting 125/94 mmHg Pain: C/o headache and hip pain - RN already aware    See Function Navigator for Current Functional Status.   Therapy/Group: Individual Therapy  Karolee Stamps Darrol Poke, PT, DPT  07/01/2015, 2:47 PM

## 2015-07-01 NOTE — Progress Notes (Signed)
Physical Therapy Session Note  Patient Details  Name: Darryl [name] MRN: [id number] Date of Birth: 06/24/1950  Today's Date: 07/01/2015 PT Individual Time: 0915-1000 PT Individual Time Calculation (min): 45 min   Short Term Goals: Week 1:  PT Short Term Goal 1 (Week 1): Patient will ambulate 50 ft using LRAD with assist of 1 person.  PT Short Term Goal 2 (Week 1): Patient will transfer bed <> wheelchair with mod A x 1.  PT Short Term Goal 3 (Week 1): Patient will negotiate up/down 12 stairs with assist of 1 person.  PT Short Term Goal 4 (Week 1): Patient will propel wheelchair x 150 ft with supervision for strengthening/endurance.  PT Short Term Goal 5 (Week 1): Patient will maintain standing balance during functional task x 3 min with assist of 1 person.   Skilled Therapeutic Interventions/Progress Updates:   Bedrest orders have been lifted per MD.  Focused on functional bed mobility, sitting balance, attention, orientation, and overall functional participation. Pt able to state that he was on a neuro unit and that is was January. Pt tending to perseverate on a subject requiring frequent redirection by therapist to task at hand. Sitting balance EOB with supervision to min assist but continues to attempt to lay back down stating he needed to rest and his head hurt. Attempted to educate on importance of increasing OOB tolerance but pt with poor carryover. Using pictures in room of family, had patient attempt to engage in identifying family members and location of pictures taken (it was written on the frame but pt required cues utilize this information effectively). Pt unwilling and refusing to get OOB at this time stating he needed to rest and would do it in his next session. Missed 45 min of skilled PT due to refusal to participate.   Therapy Documentation Precautions:  Precautions Precautions: Fall Restrictions Weight Bearing Restrictions: No  Pain:  c/o headache and dizziness. RN  aware.   See Function Navigator for Current Functional Status.   Therapy/Group: Individual Therapy  Karolee Stamps Darrol Poke, PT, DPT  07/01/2015, 10:39 AM

## 2015-07-02 ENCOUNTER — Inpatient Hospital Stay (HOSPITAL_COMMUNITY): Payer: BC Managed Care – PPO | Admitting: *Deleted

## 2015-07-02 ENCOUNTER — Inpatient Hospital Stay (HOSPITAL_COMMUNITY): Payer: BC Managed Care – PPO

## 2015-07-02 ENCOUNTER — Inpatient Hospital Stay (HOSPITAL_COMMUNITY): Payer: BC Managed Care – PPO | Admitting: Speech Pathology

## 2015-07-02 ENCOUNTER — Inpatient Hospital Stay (HOSPITAL_COMMUNITY): Payer: BC Managed Care – PPO | Admitting: Physical Therapy

## 2015-07-02 LAB — GLUCOSE, CAPILLARY
Glucose-Capillary: 130 mg/dL — ABNORMAL HIGH (ref 65–99)
Glucose-Capillary: 146 mg/dL — ABNORMAL HIGH (ref 65–99)
Glucose-Capillary: 125 mg/dL — ABNORMAL HIGH (ref 65–99)
Glucose-Capillary: 147 mg/dL — ABNORMAL HIGH (ref 65–99)

## 2015-07-02 MED ORDER — LEVETIRACETAM 100 MG/ML PO SOLN
500.0000 mg | Freq: Two times a day (BID) | ORAL | Status: DC
  Administered 2015-07-02 – 2015-07-24 (×44): 500 mg via ORAL
  Filled 2015-07-02 (×44): qty 5

## 2015-07-02 NOTE — Progress Notes (Signed)
Occupational Therapy Session Note  Patient Details  Name: Keyshawn Hellwig MRN: [id number] Date of Birth: 02-Jan-1951  Today's Date: 07/02/2015 OT Individual Time: 0700-0828 OT Individual Time Calculation (min): 88 min    Short Term Goals: Week 1:  OT Short Term Goal 1 (Week 1): Pt will complete all bathing with min assist sit to stand. OT Short Term Goal 2 (Week 1): Pt will complete LB dressing with min assist sit to stand with AE PRN. OT Short Term Goal 3 (Week 1): Pt will transfer to the walk-in shower with min assist using the RW and shower seat.  OT Short Term Goal 4 (Week 1): Pt will complete toilet transfer with min assist to 3:1 OT Short Term Goal 5 (Week 1): Pt will demonstrate anticipatory awareness by waiting for assistance prior to completing any sit to stand transtions or transfers.   Skilled Therapeutic Interventions/Progress Updates:    Pt resting in enclosure bed upon arrival.  Pt required mod multimodal cues to participate in BADL retraining including bathing and dressing with sit<>stand from w/c at sink.  Pt perform stand pivot transfer with mod A and sit<>stand at sink with min A.  Pt required steady A for standing balance at sink.  Pt oriented to place but not situation.  Pt oriented to month but not year.  Pt required max verbal cues to initiate all bathing tasks and perseverated on washing hands and arms.  Pt initiated dressing tasks when presented with clothing.  Pt constantly requested to return to bed and required max verbal cues to continue to participate.  Pt declined to eat breakfast.  Pt required more than a reasonable amount of time to complete tasks with multiple rest breaks.  Pt returned to enclosure bed at end of session.  Focus on activity tolerance, active participation, functional transfers, sit<>stand, standing balance, task initiation, sequencing, attention to task, and safety awareness to increase independence with BADLs.  Therapy Documentation Precautions:   Precautions Precautions: Fall Restrictions Weight Bearing Restrictions: No Pain: Pain Assessment Pain Score: Asleep Faces Pain Scale: No hurt  See Function Navigator for Current Functional Status.   Therapy/Group: Individual Therapy  Rich Brave 07/02/2015, 8:31 AM

## 2015-07-02 NOTE — Progress Notes (Addendum)
Occupational Therapy Session Note  Patient Details  Name: Darryl [name] MRN: [id number] Date of Birth: 1950-06-16  Today's Date: 07/02/2015 OT Individual Time: [phone] (make-up time) OT Individual Time Calculation (min): 42 min    Short Term Goals: Week 1:  OT Short Term Goal 1 (Week 1): Pt will complete all bathing with min assist sit to stand. OT Short Term Goal 2 (Week 1): Pt will complete LB dressing with min assist sit to stand with AE PRN. OT Short Term Goal 3 (Week 1): Pt will transfer to the walk-in shower with min assist using the RW and shower seat.  OT Short Term Goal 4 (Week 1): Pt will complete toilet transfer with min assist to 3:1 OT Short Term Goal 5 (Week 1): Pt will demonstrate anticipatory awareness by waiting for assistance prior to completing any sit to stand transtions or transfers.   Skilled Therapeutic Interventions/Progress Updates:    Pt seen for OT session focusing on ADL re-training and cognitive re-training. Pt in supine upon arrival with RN present to administer medications. He voiced complaints of headache, however, agreeable to tx session. He donned pants in supine with assist to thread R LE and cues for maintaining attention to task. He came to EOB with min A and multimodal cues for technique. Seated EOB, he donned shoes with steadying assist, he leaned forward to tie shoes, however, voiced increased dizziness and headache when leaning forward, assist provided. Sweatshirt donned with significantly increased time Sitting EOB, worked on pt sorting playing cards by suit. He required directions for task x3, however, once started he was able to sequence cards correctly. He voiced increased neck strain when seated EOB, and laid back into supine from EOB for support. While laying, pt correctly sequenced entire suit of playing cards with increased time.  Pt oriented to year but not month, re-oriented at beginning of session, however, at end of session incorrectly  identified month again. He voiced a "problem with bees" during session, however could not elaborate on the problem.  Pt left in supine in vail bed at end of session, all needs in reach.  Therapy Documentation Precautions:  Precautions Precautions: Fall Restrictions Weight Bearing Restrictions: No Pain: Pain Assessment Pain Assessment: 0-10 Pain Score: 8  Pain Type: Acute pain Pain Location: Head Pain Orientation: Anterior Pain Descriptors / Indicators: Headache Pain Frequency: Constant Pain Onset: On-going Patients Stated Pain Goal: 5 Pain Intervention(s): Medication (See eMAR) (oxycodone 10 mg po), RN aware, repositioned, rest  See Function Navigator for Current Functional Status.   Therapy/Group: Individual Therapy  Lewis, Deandrea Rion C 07/02/2015, 3:12 PM
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
## 2015-07-02 NOTE — Progress Notes (Signed)
Recreational Therapy Assessment and Plan  Patient Details  Name: Darryl [name] MRN: [id number] Date of Birth: Jun 29, 1950 Today's Date: 07/02/2015  Rehab Potential: Good ELOS: 2 weeks   Assessment Clinical Impression:  Problem List:  Patient Active Problem List   Diagnosis Date Noted  . Pain   . Poor fluid intake   . Essential hypertension   . Type 2 diabetes mellitus with complication, without long-term current use of insulin ([place])   . Disorientation   . Convulsions ([place])   . Acute blood loss anemia   . Hypokalemia   . Ileus ([place])   . Leukocytosis   . Right knee pain   . Cognitive safety issue   . Impulsiveness   . PVC (premature ventricular contraction)   . Abdominal discomfort   . SAH (subarachnoid hemorrhage) ([place]) 06/11/2015  . Emesis   . Subarachnoid hemorrhage ([place] 06/10/2015  . Hypertension   . Diabetes mellitus without complication [facility])     Past Medical History:  Past Medical History  Diagnosis Date  . Hypertension   . Seizures ([place])   . Diabetes mellitus without complication [facility])    Past Surgical History:  Past Surgical History  Procedure Laterality Date  . Knee surgery    . Cholecystectomy    . Radiology with anesthesia N/A 06/11/2015    Procedure: RADIOLOGY WITH ANESTHESIA; Surgeon: [name], MD; Location: [place]; Service: Radiology; Laterality: N/A;    Assessment & Plan Clinical Impression: Patient is a 65 y.o. year old male with recent admission to the hospital on 06/10/15 with severe HA but CT head without acute changes. Late that day he had worsening of HA followed by N/V later with unresponsiveness. Patient intubated and sedated in ED. 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 He was evaluated by Dr. Saintclair Halsted and ventriculostomy placed for management of hydrocephalus. CTA with dissection or  vasospasm in 4 mm aneurysm distal right V4 segment. 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 Patient transferred to CIR on 06/27/2015.      Pt presents with decreased activity tolerance, decreased functional mobility, decreased balance, decreased attention, decreased safety decreased problem solving & decreased memory Limiting pt's independence with leisure/community pursuits.   Leisure History/Participation Premorbid leisure interest/current participation: Games - Other (Comment);Games - Cards (scrabble, tonk, Programmer, systems, whisk) Expression Interests:  (jazz, gospel, r &b) Other Leisure Interests: Television;Movies;Reading;Computer (time magazine, sports illustrated) Leisure Participation Style: Alone;With Family/Friends Awareness of Community Resources: Good-identify 3 post discharge leisure resources Psychosocial / Spiritual Spiritual Interests: Church;Womens'Men's Groups Social interaction - Mood/Behavior: Cooperative Engineer, drilling for Education?: Yes Recreational Therapy Orientation Orientation -Reviewed with patient: Available activity resources Strengths/Weaknesses Patient Strengths/Abilities: Willingness to participate;Active premorbidly Patient weaknesses: Physical limitations TR Patient demonstrates impairments in the following area(s): Behavior;Endurance;Motor;Pain;Perception;Safety  Plan Rec Therapy Plan Is patient appropriate for Therapeutic Recreation?: Yes Rehab Potential: Good Treatment times per week: Min 1 time per week >20 minutes Estimated Length of Stay: 2 weeks TR Treatment/Interventions: Adaptive equipment instruction;1:1 session;Balance/vestibular training;Functional mobility training;Community reintegration;Cognitive remediation/compensation;Leisure education;Patient/family education;Therapeutic activities;Recreation/leisure participation;Therapeutic exercise;Provide activity resources  in room;UE/LE Coordination activities;Wheelchair propulsion/positioning Recommendations for other services: Neuropsych  Recommendations for other services: Neuropsych  Discharge Criteria: Patient will be discharged from TR if patient refuses treatment 3 consecutive times without medical reason.  If treatment goals not met, if there is a change in medical status, if patient makes no progress towards goals or if patient is discharged from hospital.  The above assessment, treatment plan, treatment alternatives and goals were discussed and mutually agreed upon: by patient  [place] 07/02/2015, 3:40 PM

## 2015-07-02 NOTE — Progress Notes (Signed)
Speech Language Pathology Daily Session Note  Patient Details  Name: Kailash Hinze MRN: [id number] Date of Birth: 01-31-1951  Today's Date: 07/02/2015 SLP Individual Time: 1015-1045 SLP Individual Time Calculation (min): 30 min  Missed Time: 30 minutes due to pain/unwillingness to participate   Short Term Goals: Week 1: SLP Short Term Goal 1 (Week 1): Patient will orient to time, place and situation with Mod A question and verbal cues.  SLP Short Term Goal 2 (Week 1): Patient will demonstrate sustained attention to functional tasks for 5 minutes with Mod A verbal cues for redirection. SLP Short Term Goal 3 (Week 1): Patient will identify 1 physical and 1 cognitive deficit with Mod A verbal and question cues.  SLP Short Term Goal 4 (Week 1): Patient will consume current diet with minimal overt s/s of aspiration with Mod A verbal cues for use of swallowing compensatory strategies.  SLP Short Term Goal 5 (Week 1): Patient will consume trials of Dys. 2 textures and demonstrate efficent mastication with minimal oral residue with Min A verbal cues to self-monitor and correct oral residue over 2 consecutive sessions prior to upgrade.  SLP Short Term Goal 6 (Week 1): Patient will consume trials of thin liquids via cup with minimal overt s/s of aspiration with Min A verbal cues for use of swallowing compensatory strategies over 2 consecutive sessions prior to repeat MBS for possible upgrade.   Skilled Therapeutic Interventions: Skilled treatment session focused on cognitive goals. Upon arrival, patient was awake while in bed and reporting a headache in which he had already received medication. SLP facilitated session by providing total A for orientation and Max encouragement and multimodal cues in order for patient to participate in session. Patient declined to reposition himself in bed or transfer out of bed and was perseverative on not feeling well and this clincian "coming back in the morning." Patient's  wife also present and attempted to encourage patient without success. Patient missed remaining 30 minutes of session due to pain and declining to participate. Patient left supine in enclosure bed with all needs within reach and wife present. Continue with current plan of care.   Function:  Cognition Comprehension Comprehension assist level: Understands basic 50 - 74% of the time/ requires cueing 25 - 49% of the time  Expression   Expression assist level: Expresses basic 50 - 74% of the time/requires cueing 25 - 49% of the time. Needs to repeat parts of sentences.  Social Interaction Social Interaction assist level: Interacts appropriately 50 - 74% of the time - May be physically or verbally inappropriate.  Problem Solving Problem solving assist level: Solves basic 25 - 49% of the time - needs direction more than half the time to initiate, plan or complete simple activities  Memory Memory assist level: Recognizes or recalls 25 - 49% of the time/requires cueing 50 - 75% of the time    Pain Pain Assessment Pain Assessment: 0-10 Pain Score: 7  Pain Type: Acute pain Pain Location: Head Pain Descriptors / Indicators: Headache Pain Intervention(s): Patient pre-medicated, RN made aware.   Therapy/Group: Individual Therapy  Kiyaan Haq 07/02/2015, 11:09 AM

## 2015-07-02 NOTE — Progress Notes (Signed)
[place] PHYSICAL MEDICINE & REHABILITATION     PROGRESS NOTE    Subjective/Complaints: Up with OT. Concerned that he can't remember coming to hospital and the events surrounding his admission. Leg pain better  .  ROS limited by cognition and behavior.  Objective: Vital Signs: Blood pressure 127/85, pulse 87, temperature 98.2 F (36.8 C), temperature source Oral, resp. rate 18, height  (1.803 m), SpO2 98 %. No results found. No results for input(s): WBC, HGB, HCT, PLT in the last 72 hours. No results for input(s): NA, K, CL, GLUCOSE, BUN, CREATININE, CALCIUM in the last 72 hours.  Invalid input(s): CO CBG (last 3)   Recent Labs  07/01/15 1646 07/01/15 2108 07/02/15 0708  GLUCAP 163* 115* 130*    Wt Readings from Last 3 Encounters:  06/13/15 89.6 kg (197 lb 8.5 oz)  06/08/15 91.627 kg (202 lb)  05/17/15 86.456 kg (190 lb 9.6 oz)    Physical Exam:  Constitutional: He appears well-developed and well-nourished.  HENT:  Head: Normocephalic and atraumatic.  Small incision on scalp with a couple of sutures and staples--+/- soft. Tender to touch.  Eyes: Conjunctivae are normal. Pupils are equal, round, and reactive to light.  Neck: Neck supple.  Limited ROM laterally and flexion limited due to pain.  Cardiovascular: Normal rate and regular rhythm.  Respiratory: Effort normal and breath sounds normal. No stridor. No respiratory distress. He has no wheezes.  GI: Soft. Bowel sounds are normal. He exhibits no distension. There is no tenderness.  Musculoskeletal: He exhibits no edema.  No tenderness in extremeties  Neurological: He is alert.  Keeps eyes closed.  A&Ox2, name and place.  Less distracted and lacks insight and awareness of deficits. perseverative He was able to follow one and occasional two step motor commands.  Pain with SLR bilaterally.  Motor: B/l UE 4/5 B/l hip flexion 4-/5, ankle dorsi/plantar flexion 4+/5 ---moves all 4's without obvious  preference this am. Still too distractive to perform full MMT  Skin: Skin is warm and dry.  Psychiatric: Cognition and memory are impaired. He expresses inappropriate judgment. He is inattentive.     Assessment/Plan:  1. Cognitive, swallowing, and mobility deficits secondary to [facility] which require 3+ hours per day of interdisciplinary therapy in a comprehensive inpatient rehab setting. Physiatrist is providing close team supervision and 24 hour management of active medical problems listed below. Physiatrist and rehab team continue to assess barriers to discharge/monitor patient progress toward functional and medical goals.  Function:  Bathing Bathing position   Position: Bed  Bathing parts Body parts bathed by patient: Right arm, Left arm, Chest, Abdomen, Front perineal area, Right upper leg, Left upper leg Body parts bathed by helper: Buttocks, Right lower leg, Left lower leg  Bathing assist        Upper Body Dressing/Undressing Upper body dressing   What is the patient wearing?: Pull over shirt/dress     Pull over shirt/dress - Perfomed by patient: Thread/unthread right sleeve, Thread/unthread left sleeve, Put head through opening, Pull shirt over trunk          Upper body assist Assist Level: Supervision or verbal cues   Set up : To obtain clothing/put away  Lower Body Dressing/Undressing Lower body dressing   What is the patient wearing?: Underwear, Pants, Non-skid slipper socks, Ted Hose Underwear - Performed by patient: Thread/unthread right underwear leg, Thread/unthread left underwear leg, Pull underwear up/down Underwear - Performed by helper: Thread/unthread right underwear leg, Thread/unthread left underwear leg, Pull  underwear up/down Pants- Performed by patient: Pull pants up/down, Thread/unthread right pants leg Pants- Performed by helper: Thread/unthread left pants leg Non-skid slipper socks- Performed by patient: Don/doff right sock Non-skid slipper socks-  Performed by helper: Don/doff right sock, Don/doff left sock               TED Hose - Performed by helper: Don/doff right TED hose, Don/doff left TED hose  Lower body assist Assist for lower body dressing: Supervision or verbal cues (Bed level)      Toileting Toileting   Toileting steps completed by patient: Performs perineal hygiene Toileting steps completed by helper: Adjust clothing prior to toileting, Performs perineal hygiene, Adjust clothing after toileting    Toileting assist Assist level: Two helpers   Transfers Chair/bed transfer   Chair/bed transfer method: Squat pivot Chair/bed transfer assist level: 2 helpers       Locomotion Ambulation     Max distance: 15 ft Assist level: 2 helpers   Wheelchair   Type: Manual Max wheelchair distance: 50 Assist Level: Supervision or verbal cues  Cognition Comprehension Comprehension assist level: Understands basic 50 - 74% of the time/ requires cueing 25 - 49% of the time  Expression Expression assist level: Expresses basic 50 - 74% of the time/requires cueing 25 - 49% of the time. Needs to repeat parts of sentences.  Social Interaction Social Interaction assist level: Interacts appropriately 50 - 74% of the time - May be physically or verbally inappropriate.  Problem Solving Problem solving assist level: Solves basic 25 - 49% of the time - needs direction more than half the time to initiate, plan or complete simple activities  Memory Memory assist level: Recognizes or recalls 50 - 74% of the time/requires cueing 25 - 49% of the time   Medical Problem List and Plan: 1. 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 Appears a little less distracted today  2. LLE DVT: eliquis. No bleeding sequelae 3. Pain Management:  oxycodone prn for headaches and back/leg pain.   -see #15 below 4. Mood: LCSW to follow for  evaluation and support.  5. Neuropsych: This patient is not  capable of making decisions on his own behalf.  -continue to provide appropriate environment for sleep and communication  -avoid overstimulating  -  sleep chart 6. Skin/Wound Care: routine pressure relief measures.  7. Fluids/Electrolytes/Nutrition: Has been poor in part due to lethargy. Added nutritional supplements. Monitor for now.  .  -follow up labs today as available 8. HTN: Monitor BP tid   -resumed low dose Cozaar. Titrate medications as indicated. Fair control at present  9. DM type 2: Monitor BS ac/hs basis. Po intake has been variable due to lethargy and cognitive issues. Wife encouraged to bring pureed foods from home.   -continue to hold metformin and use SSI for now. sugars remain under control at present 10. Seizures disorder: Was on lamictal bid prior to admission. On keppra bid which has been changed to PO 11. ABLA: continue iron supplement..  12. Mild ileus:   two Senna S bid. Use enema prn.    13. Hypokalemia: Supplement to avoid recurrence of ileus.  14. Leucocytosis: with complaints of back pain. Blood work pending as is UA/CX.  -he is afebrile  15. Chronic right knee pain/leg pain: Off NSAIDs.   -voltaren gel, prn ICE 16. PVCs: Negative cardiac work up 05/2015 17. Safety:   enclosure bed for  fall prevention/safety is still required   -order renewed again today LOS (Days) 5 A FACE TO FACE EVALUATION WAS PERFORMED  Rylie Limburg T 07/02/2015 9:12 AM

## 2015-07-02 NOTE — Progress Notes (Signed)
Physical Therapy Session Note  Patient Details  Name: Darryl [name] MRN: [id number] Date of Birth: 1951-03-30  Today's Date: 07/02/2015 PT Individual Time: 1130-1205 PT Individual Time Calculation (min): 35 min   Short Term Goals: Week 1:  PT Short Term Goal 1 (Week 1): Patient will ambulate 50 ft using LRAD with assist of 1 person.  PT Short Term Goal 2 (Week 1): Patient will transfer bed <> wheelchair with mod A x 1.  PT Short Term Goal 3 (Week 1): Patient will negotiate up/down 12 stairs with assist of 1 person.  PT Short Term Goal 4 (Week 1): Patient will propel wheelchair x 150 ft with supervision for strengthening/endurance.  PT Short Term Goal 5 (Week 1): Patient will maintain standing balance during functional task x 3 min with assist of 1 person.   Skilled Therapeutic Interventions/Progress Updates:   neuromuscular re-education via forced use, VCs, manual cues for alternating reciprocal movement, LLE loading, = LE wt bearing during:  w/c propulsion using bil LEs; passive stretch L hamstrings in sitting, x 30 seconds x 2; full bil knee ext in standing with forearm support on sturdy table in front; sidestepping L><R x 4' with bil forearm support  Therapeutic activity/dual task of standing with bil forearm><bil flat hand support on sturdy table, while playing "war" card game with wife across table. Pt attended to game well with min cues, but was internally distracted due to fatigue and bil LE pain with extension.    Pt participated during entire session with moderate encouragement, and agreed to stay up in w/c for lunch.  Quick release belt donned; all needs within reach and wife in room.    Therapy Documentation Precautions:  Precautions Precautions: Fall Restrictions Weight Bearing Restrictions: No  Pain Assessment Pain Assessment: 0-10 Pain Score: 7  Pain Type: Acute pain Pain Location: Head Pain Descriptors / Indicators: Headache Pain Intervention(s): Medication (See  eMAR) (oxycodone 10 mg)          See Function Navigator for Current Functional Status.   Therapy/Group: Individual Therapy  Almetta Liddicoat 07/02/2015, 12:17 PM

## 2015-07-03 ENCOUNTER — Inpatient Hospital Stay (HOSPITAL_COMMUNITY): Payer: BC Managed Care – PPO | Admitting: Speech Pathology

## 2015-07-03 ENCOUNTER — Inpatient Hospital Stay (HOSPITAL_COMMUNITY): Payer: BC Managed Care – PPO | Admitting: *Deleted

## 2015-07-03 ENCOUNTER — Inpatient Hospital Stay (HOSPITAL_COMMUNITY): Payer: BC Managed Care – PPO | Admitting: Occupational Therapy

## 2015-07-03 ENCOUNTER — Inpatient Hospital Stay (HOSPITAL_COMMUNITY): Payer: BC Managed Care – PPO | Admitting: Physical Therapy

## 2015-07-03 DIAGNOSIS — I82412 Acute embolism and thrombosis of left femoral vein: Secondary | ICD-10-CM

## 2015-07-03 LAB — GLUCOSE, CAPILLARY
Glucose-Capillary: 143 mg/dL — ABNORMAL HIGH (ref 65–99)
Glucose-Capillary: 135 mg/dL — ABNORMAL HIGH (ref 65–99)
Glucose-Capillary: 142 mg/dL — ABNORMAL HIGH (ref 65–99)

## 2015-07-03 MED ORDER — METHYLPHENIDATE HCL 5 MG PO TABS
5.0000 mg | ORAL_TABLET | Freq: Two times a day (BID) | ORAL | Status: DC
  Administered 2015-07-03 – 2015-07-05 (×4): 5 mg via ORAL
  Filled 2015-07-03 (×4): qty 1

## 2015-07-03 NOTE — Patient Care Conference (Signed)
Inpatient RehabilitationTeam Conference and Plan of Care Update Date: 07/03/2015   Time: 2:40 PM    Patient Name: Darryl [name]      Medical Record Number: [id number]  Date of Birth: January 17, 1951 Sex: Male         Room/Bed: 4W23C/4W23C-01 Payor Info: Payor: BLUE [employer] BLUE [employer] / Plan: [facility] PPO / Product Type: *No Product type* /    Admitting Diagnosis: SAH with cognitive deficits  Admit Date/Time:  06/27/2015  4:13 PM Admission Comments: No comment available   Primary Diagnosis:  SAH (subarachnoid hemorrhage) ([facility]) Principal Problem: SAH (subarachnoid hemorrhage) ([facility])  Patient Active Problem List   Diagnosis Date Noted  . Acute deep vein thrombosis (DVT) of left femoral vein ([facility]) 07/01/2015  . Pain   . Poor fluid intake   . Essential hypertension   . Type 2 diabetes mellitus with complication, without long-term current use of insulin ([facility])   . Disorientation   . Convulsions ([facility])   . Acute blood loss anemia   . Hypokalemia   . Ileus ([facility])   . Leukocytosis   . Right knee pain   . Cognitive safety issue   . Impulsiveness   . PVC (premature ventricular contraction)   . Abdominal discomfort   . SAH (subarachnoid hemorrhage) ([facility]) 06/11/2015  . Emesis   . Subarachnoid hemorrhage ([facility]) 06/10/2015  . Hypertension   . Diabetes mellitus without complication [facility])     Expected Discharge Date: Expected Discharge Date: 07/17/15  Team Members Present: Physician leading conference: Dr. Faith Rogue Social Worker Present: Amada Jupiter, LCSW Nurse Present: Other (comment) Cheri Guppy, RN) PT Present: Bayard Hugger, PT OT Present: Callie Fielding, OT;Ardis Rowan, COTA SLP Present: Feliberto Gottron, SLP PPS Coordinator present : Tora Duck, RN, CRRN     Current Status/Progress Goal Weekly Team Focus  Medical   [facility], cognitive and behavioral deficits, large LLE DVT.   improve attention/focus  sleep, attention/focus, ?bp control, safety   Bowel/Bladder   incontinent  b/b LBM 1-29 . occasional continent eipsode with stafff toileting using urinal.  manage b/b  continue on toileting schedule   Swallow/Nutrition/ Hydration   Dys. 1 textures with nectar-thick liquids, Mod A for use of swallowing compensatory strategies   Supervision with least restrictive diet  trials of upgraded textures/liquids    ADL's   max encouragement to actively participate, max verbal cues for task initiation and sequencing, functional tranfsers-mod/max A, sit<>stand-min A, BADLs-min/mod A, limited awareness  supervision overall  activity tolerance, funcitonal transfers, BADL retraining, cognitive remediation   Mobility   mod-max A transfers, sit <> stand min-mod A, gait up to 35 ft using RW with max A and +2 for w/c follow, max encouragement to participate  supervision overall  attention, orientation, initiation, awareness, functional mobility, standing tolerance, activity tolerance, pt/family education   Communication             Safety/Cognition/ Behavioral Observations  Mod-Max A   min assist   attention, initiation, awareness, problem solving    Pain   constant headache on anterior head. oxycodone 10 mg po every 4 hours with minimal relief  less than 5  monitor effectiveness of pain meds   Skin   intact, dry eucerin cream applied   no new breakdown   educate wife on skin care     Rehab Goals Patient on target to meet rehab goals: Yes *See Care Plan and progress notes for long and short-term goals.  Barriers to Discharge: poor attention,focus/cognition waxes and  wanes    Possible Resolutions to Barriers:  ongoing cognitive remediation, activity,     Discharge Planning/Teaching Needs:  Home with wife to provide 24/7 assistance if she feels capable.    To be scheduled.   Team Discussion:  Memory still very impaired.  Oriented to person and place. Cognition is variable with fleeting moments of very clear thoughts.  Poor initiation.  C/o dizziness with PT - ?visual issues.   Had hoped he would have shown better cognition by this time.  Supervision goals overall.   Revisions to Treatment Plan:  NA   Continued Need for Acute Rehabilitation Level of Care: The patient requires daily medical management by a physician with specialized training in physical medicine and rehabilitation for the following conditions: Daily direction of a multidisciplinary physical rehabilitation program to ensure safe treatment while eliciting the highest outcome that is of practical value to the patient.: Yes Daily medical management of patient stability for increased activity during participation in an intensive rehabilitation regime.: Yes Daily analysis of laboratory values and/or radiology reports with any subsequent need for medication adjustment of medical intervention for : Diabetes problems;Neurological problems;Blood pressure problems  Darryl [name] 07/04/2015, 9:56 AM

## 2015-07-03 NOTE — Progress Notes (Signed)
Physical Therapy Session Note  Patient Details  Name: Darryl [name] MRN: [id number] Date of Birth: [birth year]  Today's Date: 07/03/2015 PT Co-Treatment Time: 0930-1000 PT Co-Treatment Time Calculation (min): 30 min  Short Term Goals: Week 1:  PT Short Term Goal 1 (Week 1): Patient will ambulate 50 ft using LRAD with assist of 1 person.  PT Short Term Goal 2 (Week 1): Patient will transfer bed <> wheelchair with mod A x 1.  PT Short Term Goal 3 (Week 1): Patient will negotiate up/down 12 stairs with assist of 1 person.  PT Short Term Goal 4 (Week 1): Patient will propel wheelchair x 150 ft with supervision for strengthening/endurance.  PT Short Term Goal 5 (Week 1): Patient will maintain standing balance during functional task x 3 min with assist of 1 person.   Skilled Therapeutic Interventions/Progress Updates:   Skilled co-treatment with SLP to address cognitive goals and functional mobility including stand pivot transfer with mod A, propelling wheelchair using BUE 2 x 150 ft with supervision and increased time and cues for technique, sit <> stand x 4 from wheelchair with mod A overall, and ambulation back to bed x 15 ft with +2A via 3 musketeers technique for safety. Patient able to maintain standing with BUE support on table while engaging in cognitive table top tasks x 2 min, 1.5 min, 45 sec, and 1 min with max encouragement and min-mod A overall, patient limited by BLE weakness/pain, lying down over table with c/o dizziness, vitals assessed, standing BP 156/105 and seated BP 133/91. Patient left supine in enclosure bed with call bell within reach. See SLP note for further details.   Therapy Documentation Precautions:  Precautions Precautions: Fall Restrictions Weight Bearing Restrictions: No Pain: Pain Assessment Pain Assessment: Faces Faces Pain Scale: Hurts even more Pain Type: Acute pain Pain Location: Head Pain Orientation: Anterior Pain Intervention(s): Medication (See  eMAR);RN made aware;Emotional support Multiple Pain Sites: No   See Function Navigator for Current Functional Status.   Therapy/Group: Co-Treatment  Bayard Hugger A 07/03/2015, 12:40 PM

## 2015-07-03 NOTE — Progress Notes (Signed)
Physical Therapy Session Note  Patient Details  Name: Darryl [name] MRN: [id number] Date of Birth: Mar 08, 1951  Today's Date: 07/03/2015 PT Individual Time: 1500-1530 PT Individual Time Calculation (min): 30 min   Short Term Goals: Week 1:  PT Short Term Goal 1 (Week 1): Patient will ambulate 50 ft using LRAD with assist of 1 person.  PT Short Term Goal 2 (Week 1): Patient will transfer bed <> wheelchair with mod A x 1.  PT Short Term Goal 3 (Week 1): Patient will negotiate up/down 12 stairs with assist of 1 person.  PT Short Term Goal 4 (Week 1): Patient will propel wheelchair x 150 ft with supervision for strengthening/endurance.  PT Short Term Goal 5 (Week 1): Patient will maintain standing balance during functional task x 3 min with assist of 1 person.   Skilled Therapeutic Interventions/Progress Updates:   Focus on command following, safety awareness, standing balance, progression of functional ambulation, postural control, and activity tolerance. Patient received in enclosure bed, required multiple attempts at supine > sit due to patient attempting to sit straight up in enclosure bed with min A and multiple attempts at sit > stand from bed to don pants. Patient telling PT to "give me space or get punched in the stomach," when PT assisting patient with standing balance, therefore support removed in planned failure and patient sat back on bed and then agreeable to PT assisting patient. Gait training using RW x 35 ft + 20 ft + 5 ft with mod A overall and max multimodal cues for upright posture due to crouched, forward flexed posture and safe management of RW, +2A for wheelchair follow for safety. Patient propelled wheelchair using BUE with supervision and agreeable to staying up, patient left sitting in wheelchair with quick release belt on at RN station.    Therapy Documentation Precautions:  Precautions Precautions: Fall Restrictions Weight Bearing Restrictions: No Pain: Denied  pain  See Function Navigator for Current Functional Status.   Therapy/Group: Individual Therapy  Kerney Elbe 07/03/2015, 3:36 PM

## 2015-07-03 NOTE — Progress Notes (Signed)
Occupational Therapy Session Note  Patient Details  Name: Darryl [name] MRN: [id number] Date of Birth: [birth year]  Today's Date: 07/03/2015 OT Individual Time: 4132-4401 OT Individual Time Calculation (min): 44 min    Short Term Goals: Week 1:  OT Short Term Goal 1 (Week 1): Pt will complete all bathing with min assist sit to stand. OT Short Term Goal 2 (Week 1): Pt will complete LB dressing with min assist sit to stand with AE PRN. OT Short Term Goal 3 (Week 1): Pt will transfer to the walk-in shower with min assist using the RW and shower seat.  OT Short Term Goal 4 (Week 1): Pt will complete toilet transfer with min assist to 3:1 OT Short Term Goal 5 (Week 1): Pt will demonstrate anticipatory awareness by waiting for assistance prior to completing any sit to stand transtions or transfers.   Skilled Therapeutic Interventions/Progress Updates:    Pt in vail bed at start of session.  Mod coaxing and min assist to transition to sitting.  Had pt work on dressing as he had taken clothing off from earlier OT session.  Therapist applied new brief with max assist while he stood with mod assist to pull it over his hips.  With return to sitting pt was presented with sweat pants, which he was able to donn over his legs with increased time.  Mod assist for sit to stand in order for him to pull up over his hips.  Once sitting pt returned to supine without asking therapist.  Throughout treatment pt would get fatigued per his report and just lay down.  Max coaxing to sit back up to complete donning shirt and shoes.  Pt not oriented to place, time, or situation.  Pt with answers to some questions that did not make any sense, related to the question being asked.  At times pt talking out loud with statements not related to situation as well.  Pt donned pullover and his shoes but would not tie them.  Coaxed pt to transfer to the wheelchair where he needed max demonstrational cueing to finally attempt tying his shoes.   Mod assist needed for tying shoes.  Ended session with ambulation to the door of his room and back with +2 assist.  Pt maintaining flexed trunk with scissoring gait.  Pt left in wheelchair with safety belt in place and wife present in preparation for lunch.  Nursing also aware that he is up in the wheelchair.   Therapy Documentation Precautions:  Precautions Precautions: Fall Restrictions Weight Bearing Restrictions: No  Pain: Pain Assessment Pain Assessment: Faces Faces Pain Scale: Hurts even more Pain Type: Acute pain Pain Location: Head Pain Orientation: Anterior Pain Intervention(s): Medication (See eMAR);RN made aware;Emotional support Multiple Pain Sites: No ADL: See Function Navigator for Current Functional Status.   Therapy/Group: Individual Therapy  Levante Simones OTR/L 07/03/2015, 12:32 PM

## 2015-07-03 NOTE — Progress Notes (Signed)
Occupational Therapy Session Note  Patient Details  Name: Darryl [name] MRN: [id number] Date of Birth: December 12, 1950  Today's Date: 07/03/2015 OT Individual Time: 0700-0758 OT Individual Time Calculation (min): 58 min    Short Term Goals: Week 1:  OT Short Term Goal 1 (Week 1): Pt will complete all bathing with min assist sit to stand. OT Short Term Goal 2 (Week 1): Pt will complete LB dressing with min assist sit to stand with AE PRN. OT Short Term Goal 3 (Week 1): Pt will transfer to the walk-in shower with min assist using the RW and shower seat.  OT Short Term Goal 4 (Week 1): Pt will complete toilet transfer with min assist to 3:1 OT Short Term Goal 5 (Week 1): Pt will demonstrate anticipatory awareness by waiting for assistance prior to completing any sit to stand transtions or transfers.   Skilled Therapeutic Interventions/Progress Updates:    Pt resting in bed upon arrival and agreeable to getting OOB and engaging in BADL retraining including bathing and dressing with sit<>stand from w/c at sink.  Pt required min A for supine->sit EOB in preparation for transfer to w/c.  Pt initiated sit->stand from EOB with min A and completed stand pivot transfer with min A. Pt required min verbal cues to initiate bathing tasks and min verbal cues for sequencing.  Pt perseverated on washing face and hands and required min verbal cues to proceed to next task.  Pt was able to bathe buttocks and front periarea while standing this morning. Pt required min A for sit<>stand and steady A for standing balance at sink.  Pt's conversation was tangential this morning and pt required min verbal cues to remain on subject.  Pt was oriented to place but not month or year this morning. Focus on activity tolerance, sit<>stand, standing balance, functional transfers, task initiation, sequencing, attention to task, orientation, and safety awareness to increase independence with BADLs.  Pt returned to enclosure bed at end of  session.   Therapy Documentation Precautions:  Precautions Precautions: Fall Restrictions Weight Bearing Restrictions: No Pain: Pain Assessment Pain Assessment: Faces Faces Pain Scale: Hurts whole lot Pain Location: Head Pain Orientation: Posterior Pain Descriptors / Indicators: Headache Pain Onset: On-going Pain Intervention(s): RN made aware  See Function Navigator for Current Functional Status.   Therapy/Group: Individual Therapy  Rich Brave 07/03/2015, 8:01 AM

## 2015-07-03 NOTE — Progress Notes (Signed)
[place] PHYSICAL MEDICINE & REHABILITATION     PROGRESS NOTE    Subjective/Complaints: Rested fairly well. Intermittent headaches. Denies leg pain today.   .  ROS limited by cognition and behavior.  Objective: Vital Signs: Blood pressure 128/88, pulse 88, temperature 97.3 F (36.3 C), temperature source Oral, resp. rate 19, height  (1.803 m), SpO2 100 %. No results found. No results for input(s): WBC, HGB, HCT, PLT in the last 72 hours. No results for input(s): NA, K, CL, GLUCOSE, BUN, CREATININE, CALCIUM in the last 72 hours.  Invalid input(s): CO CBG (last 3)   Recent Labs  07/02/15 1703 07/02/15 2129 07/03/15 0636  GLUCAP 125* 147* 143*    Wt Readings from Last 3 Encounters:  06/13/15 89.6 kg (197 lb 8.5 oz)  06/08/15 91.627 kg (202 lb)  05/17/15 86.456 kg (190 lb 9.6 oz)    Physical Exam:  Constitutional: He appears well-developed and well-nourished.  HENT:  Head: Normocephalic and atraumatic.  Small incision on scalp with a couple of sutures and staples--+/- soft. Tender to touch.  Eyes: Conjunctivae are normal. Pupils are equal, round, and reactive to light.  Neck: Neck supple.  Limited ROM laterally and flexion limited due to pain.  Cardiovascular: Normal rate and regular rhythm.  Respiratory: Effort normal and breath sounds normal. No stridor. No respiratory distress. He has no wheezes.  GI: Soft. Bowel sounds are normal. He exhibits no distension. There is no tenderness.  Musculoskeletal: He exhibits no edema.  No tenderness in extremeties  Neurological: He is alert.  A&Ox2, name and place.  Remains distracted and lacks insight and awareness of deficits. perseverative He was able to follow one and occasional two step motor commands.   Motor: B/L UE 4/5 B/L hip flexion 4-/5, ankle dorsi/plantar flexion 4+/5 ---moves all 4's without obvious preference this am. Still too distractive to perform full MMT  Skin: Skin is warm and dry.   Psychiatric: Cognition and memory are impaired. He expresses inappropriate judgment. He is inattentive.     Assessment/Plan:  1. Cognitive, swallowing, and mobility deficits secondary to [facility] which require 3+ hours per day of interdisciplinary therapy in a comprehensive inpatient rehab setting. Physiatrist is providing close team supervision and 24 hour management of active medical problems listed below. Physiatrist and rehab team continue to assess barriers to discharge/monitor patient progress toward functional and medical goals.  Function:  Bathing Bathing position   Position: Wheelchair/chair at sink  Bathing parts Body parts bathed by patient: Right arm, Left arm, Chest, Abdomen, Front perineal area, Right upper leg, Left upper leg, Buttocks Body parts bathed by helper: Right lower leg, Left lower leg  Bathing assist Assist Level: Touching or steadying assistance(Pt > 75%)      Upper Body Dressing/Undressing Upper body dressing   What is the patient wearing?: Pull over shirt/dress     Pull over shirt/dress - Perfomed by patient: Thread/unthread right sleeve, Thread/unthread left sleeve, Put head through opening, Pull shirt over trunk          Upper body assist Assist Level: Set up, Supervision or verbal cues   Set up : To obtain clothing/put away  Lower Body Dressing/Undressing Lower body dressing   What is the patient wearing?: Underwear, Pants, Non-skid slipper socks Underwear - Performed by patient: Thread/unthread right underwear leg, Thread/unthread left underwear leg Underwear - Performed by helper: Thread/unthread right underwear leg, Thread/unthread left underwear leg, Pull underwear up/down Pants- Performed by patient: Pull pants up/down, Thread/unthread right pants leg  Pants- Performed by helper: Thread/unthread right pants leg, Thread/unthread left pants leg, Pull pants up/down Non-skid slipper socks- Performed by patient: Don/doff right sock Non-skid  slipper socks- Performed by helper: Don/doff right sock, Don/doff left sock               TED Hose - Performed by helper: Don/doff right TED hose, Don/doff left TED hose  Lower body assist Assist for lower body dressing: Supervision or verbal cues (Bed level)      Toileting Toileting   Toileting steps completed by patient: Performs perineal hygiene Toileting steps completed by helper: Adjust clothing prior to toileting, Performs perineal hygiene, Adjust clothing after toileting    Toileting assist Assist level: Two helpers   Transfers Chair/bed transfer   Chair/bed transfer method: Squat pivot Chair/bed transfer assist level: Moderate assist (Pt 50 - 74%/lift or lower) Chair/bed transfer assistive device: Armrests     Locomotion Ambulation Ambulation activity did not occur: Refused   Max distance: 15 ft Assist level: 2 helpers   Wheelchair   Type: Manual Max wheelchair distance: 150 Assist Level: Supervision or verbal cues  Cognition Comprehension Comprehension assist level: Understands basic 50 - 74% of the time/ requires cueing 25 - 49% of the time  Expression Expression assist level: Expresses basic 50 - 74% of the time/requires cueing 25 - 49% of the time. Needs to repeat parts of sentences.  Social Interaction Social Interaction assist level: Interacts appropriately 50 - 74% of the time - May be physically or verbally inappropriate.  Problem Solving Problem solving assist level: Solves basic 25 - 49% of the time - needs direction more than half the time to initiate, plan or complete simple activities  Memory Memory assist level: Recognizes or recalls 25 - 49% of the time/requires cueing 50 - 75% of the time   Medical Problem List and Plan: 1. 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 Team conference  2. LLE DVT: eliquis. No bleeding sequelae 3. Pain  Management:  oxycodone prn for headaches and back/leg pain.   -see #15 below 4. Mood: LCSW to follow for evaluation and support.  5. Neuropsych: This patient is not  capable of making decisions on his own behalf.  -continue to provide appropriate environment for sleep and communication  -avoid overstimulating  -  sleep chart  -introduce ritalin for attention 6. Skin/Wound Care: routine pressure relief measures.  7. Fluids/Electrolytes/Nutrition: Has been poor in part due to lethargy. Added nutritional supplements. Monitor for now.  .  -follow up labs today as available 8. HTN: Monitor BP tid   -resumed low dose Cozaar.   -control had been fair to reasonable until yesterday---follow for pattern today  9. DM type 2: Monitor BS ac/hs basis. Po intake has been variable due to lethargy and cognitive issues. Wife encouraged to bring pureed foods from home.   -continue to hold metformin and use SSI for now. sugars remain under control at present 10. Seizures disorder: Was on lamictal bid prior to admission. On keppra bid which has been changed to PO 11. ABLA: continue iron supplement..  12. Mild ileus:   two Senna S bid. Use enema prn.    13. Hypokalemia: Supplement to avoid recurrence of ileus.  14. Leucocytosis: with complaints of back pain. Blood work pending as is UA/CX.  -he is afebrile  15. Chronic right knee pain/lower ext pain: Off NSAIDs.   -voltaren gel, prn ICE  -  rx DVT 16. PVCs: Negative cardiac work up 05/2015 17. Safety:   enclosure bed for fall prevention/safety is still required   -order renewed again today   LOS (Days) 6 A FACE TO FACE EVALUATION WAS PERFORMED  Benoit Meech T 07/03/2015 9:11 AM

## 2015-07-03 NOTE — Progress Notes (Signed)
Speech Language Pathology Daily Session Note  Patient Details  Name: Darryl [name] MRN: [id number] Date of Birth: May 08, 1951  Today's Date: 07/03/2015 SLP Individual Time: 7829-5621 SLP Individual Time Calculation (min): 15 min and Today's Date: 07/03/2015 SLP Co-Treatment Time: 0915 (co-tx with PT (RV) 0915-1000)-0930 SLP Co-Treatment Time Calculation (min): 15 min  Short Term Goals: Week 1: SLP Short Term Goal 1 (Week 1): Patient will orient to time, place and situation with Mod A question and verbal cues.  SLP Short Term Goal 2 (Week 1): Patient will demonstrate sustained attention to functional tasks for 5 minutes with Mod A verbal cues for redirection. SLP Short Term Goal 3 (Week 1): Patient will identify 1 physical and 1 cognitive deficit with Mod A verbal and question cues.  SLP Short Term Goal 4 (Week 1): Patient will consume current diet with minimal overt s/s of aspiration with Mod A verbal cues for use of swallowing compensatory strategies.  SLP Short Term Goal 5 (Week 1): Patient will consume trials of Dys. 2 textures and demonstrate efficent mastication with minimal oral residue with Min A verbal cues to self-monitor and correct oral residue over 2 consecutive sessions prior to upgrade.  SLP Short Term Goal 6 (Week 1): Patient will consume trials of thin liquids via cup with minimal overt s/s of aspiration with Min A verbal cues for use of swallowing compensatory strategies over 2 consecutive sessions prior to repeat MBS for possible upgrade.   Skilled Therapeutic Interventions: Skilled co-treatment with PT to address cognitive goals and functional mobility. Upon arrival, patient was supine while awake in enclosure bed. SLP facilitated session by providing Max A verbal and question cues for orientation to place and situation. Patient donned pants with Mod A verbal cues and extra time and transferred to the wheelchair with assistance from PT. Patient propelled his wheelchair to and from  his room with extra time and Mod A verbal cues for technique and to attend to right field of environment. Patient participated in a basic table top task and required Mod-Max A verbal and visual cues for sustained attention, problem solving and organization with task while standing. Patient's standing limited by BLE weakness/pain with lying down over table with c/o dizziness, vitals assessed, standing BP 156/105 and seated BP 133/91. Patient left supine in enclosure bed with call bell within reach.    Function:  Cognition Comprehension Comprehension assist level: Understands basic 50 - 74% of the time/ requires cueing 25 - 49% of the time  Expression   Expression assist level: Expresses basic 50 - 74% of the time/requires cueing 25 - 49% of the time. Needs to repeat parts of sentences.  Social Interaction Social Interaction assist level: Interacts appropriately 50 - 74% of the time - May be physically or verbally inappropriate.  Problem Solving Problem solving assist level: Solves basic 25 - 49% of the time - needs direction more than half the time to initiate, plan or complete simple activities  Memory Memory assist level: Recognizes or recalls 25 - 49% of the time/requires cueing 50 - 75% of the time    Pain Pain Assessment Pain Assessment: Faces Faces Pain Scale: Hurts even more Pain Type: Acute pain Pain Location: Head Pain Orientation: Anterior Pain Intervention(s): Medication (See eMAR);RN made aware;Emotional support Multiple Pain Sites: No  Therapy/Group: Individual Therapy  Darryl [name] 07/03/2015, 1:10 PM

## 2015-07-04 ENCOUNTER — Inpatient Hospital Stay (HOSPITAL_COMMUNITY): Payer: BC Managed Care – PPO | Admitting: *Deleted

## 2015-07-04 ENCOUNTER — Inpatient Hospital Stay (HOSPITAL_COMMUNITY): Payer: BC Managed Care – PPO | Admitting: Occupational Therapy

## 2015-07-04 ENCOUNTER — Inpatient Hospital Stay (HOSPITAL_COMMUNITY): Payer: BC Managed Care – PPO

## 2015-07-04 ENCOUNTER — Inpatient Hospital Stay (HOSPITAL_COMMUNITY): Payer: BC Managed Care – PPO | Admitting: Speech Pathology

## 2015-07-04 LAB — GLUCOSE, CAPILLARY
Glucose-Capillary: 138 mg/dL — ABNORMAL HIGH (ref 65–99)
Glucose-Capillary: 159 mg/dL — ABNORMAL HIGH (ref 65–99)
Glucose-Capillary: 142 mg/dL — ABNORMAL HIGH (ref 65–99)
Glucose-Capillary: 135 mg/dL — ABNORMAL HIGH (ref 65–99)
Glucose-Capillary: 167 mg/dL — ABNORMAL HIGH (ref 65–99)
Glucose-Capillary: 92 mg/dL (ref 65–99)

## 2015-07-04 MED ORDER — PRO-STAT SUGAR FREE PO LIQD
30.0000 mL | Freq: Three times a day (TID) | ORAL | Status: DC
  Administered 2015-07-04 – 2015-07-22 (×48): 30 mL via ORAL
  Filled 2015-07-04 (×48): qty 30

## 2015-07-04 NOTE — Progress Notes (Signed)
Occupational Therapy Weekly Progress Note  Patient Details  Name: Darryl [name] MRN: [id number] Date of Birth: [birth year]  Beginning of progress report period: June 28, 2015 End of progress report period: July 04, 2015  Today's Date: 07/04/2015 OT Individual Time: 1100-1200 OT Individual Time Calculation (min): 60 min    Patient has met 1 of 5 short term goals.  Pt's STGs were set at min A. Although there have been some OT sessions in which pt has been able to sit to stand with min A or even stand to wash his bottom, today pt's R hip pain was a major factor in inhibiting patient. Pt needed max A to stand up from w/c even with using B arms on w/c and with a good forward lean. On the 6th sit to stand of the session, pt finally was able to push through his discomfort and stand to sink with guiding A. Will continue to address STGs to work towards [place] with hopes that hip/back pain decreases.  Patient continues to demonstrate the following deficits: decreased functional mobility skills of sit to stand, standing balance, decreased orientation to year/date/season, decreased initiation, decreased probleme solving and low activity tolerance and therefore will continue to benefit from skilled OT intervention to enhance overall performance with BADL.  Patient progressing toward long term goals. for some of the OT sessions. His level of function fluctuates. Continue plan of care to achieve LTGs of S.   OT Short Term Goals Week 1:  OT Short Term Goal 1 (Week 1): Pt will complete all bathing with min assist sit to stand. OT Short Term Goal 1 - Progress (Week 1): Progressing toward goal OT Short Term Goal 2 (Week 1): Pt will complete LB dressing with min assist sit to stand with AE PRN. OT Short Term Goal 2 - Progress (Week 1): Progressing toward goal OT Short Term Goal 3 (Week 1): Pt will transfer to the walk-in shower with min assist using the RW and shower seat.  OT Short Term Goal 3 - Progress (Week  1): Progressing toward goal OT Short Term Goal 4 (Week 1): Pt will complete toilet transfer with min assist to 3:1 OT Short Term Goal 4 - Progress (Week 1): Progressing toward goal OT Short Term Goal 5 (Week 1): Pt will demonstrate anticipatory awareness by waiting for assistance prior to completing any sit to stand transtions or transfers.  OT Short Term Goal 5 - Progress (Week 1): Met Week 2:  OT Short Term Goal 1 (Week 2): Pt will transfer to 3 in 1 over toilet with min A. OT Short Term Goal 2 (Week 2): Pt will transfer to shower seat with min A. OT Short Term Goal 3 (Week 2): Pt will sit to stand with min A to prepare for LB self care. OT Short Term Goal 4 (Week 2): Pt will manage clothing over hips for toileting tasks.  Skilled Therapeutic Interventions/Progress Updates:    Pt seen for skilled OT to facilitate cognition, activity tolerance, and functional mobility. Pt received at nursing station and agreeable to shower. Reviewed orientation to current season/ year. Pt unable to repeat but did state he is in a [facility] hospital for a "brain problem".  Attempted 3x to have pt stand to his RW to ambulate into shower. Pt grimacing in severe pain with each attempt to stand. Could not fully push up even with max A. Pt stated "I just have a bad hip".  Transfer plan changed to stand pivot, w/c taken into shower.  Pt pulled up with bars with max A to pivot to seat.  In shower pt maintained forward lean and needed cues to fully bathe areas he could reach.  Transferred out max A.  Continued dressing from w/c with extra time given for processing to allow pt to initiate. On last stand to pull pants up. Pt stood with steadying A and maintained standing for 3 minutes.  Shoes donned for pt has he was having too much pain to lean forward.  Pt in w/c with quick release belt on and taken to nursing station.  Therapy Documentation Precautions:  Precautions Precautions: Fall Restrictions Weight Bearing Restrictions:  No     Pain: Pain Assessment Pain Assessment: Faces Pain Score: 8  Faces Pain Scale: Hurts worst Pain Type: Acute pain Pain Location: Hip Pain Orientation: Right Pain Descriptors / Indicators: Aching Pain Onset: On-going Pain Intervention(s): Rest ADL:   See Function Navigator for Current Functional Status.   Therapy/Group: Individual Therapy  [place] 07/04/2015, 12:19 PM

## 2015-07-04 NOTE — Progress Notes (Signed)
Recreational Therapy Session Note  Patient Details  Name: Tevyn Codd MRN: [id number] Date of Birth: [birth year] Today's Date: 07/04/2015  Skilled Therapeutic Interventions/Progress Updates:Session focused on activity tolerance, orientation, attention, standing balance during co-treat with PT.  Pt awake in enclosure bed upon entry.  Pt oriented to place & approximate month (stating January)- reoriented.  Pt required max cues/encouragement & time to participate in therapy tasks.  Pt perseverative on "getting my head straight" & refusing to move.  Pt performed sit-stand with min assist with RW and performed squat pivot transfer bed-W/c with mod assist and verbal cues for safety.   Therapy/Group: Co-Treatment   Inza Mikrut 07/04/2015, 11:19 AM

## 2015-07-04 NOTE — Progress Notes (Addendum)
Occupational Therapy Note Makeup Session  Patient Details  Name: Darryl [name] MRN: [id number] Date of Birth: [birth year]  Today's Date: 07/04/2015 OT Individual Time: 1345-1430 OT Individual Time Calculation (min): 45 min  Makeup Session  Pt denied pain Individual therapy  Pt resting in bed upon arrival with wife present.  Pt's wife noted that pt had not eaten lunch.  No lunch available and RN had lunch delivered.  Pt agreed to eat a orange magic cup while seated EOB until lunch delivered.  Pt declined eating lunch after looking at plate (D1 diet with nectar thick liquids).  Pt oriented to location but not situation year (2021) or month (16). Pt stated that he just wanted to return home.  Reeducated patient on purpose and goals of rehab (supervision). Pt stated that he could take care of himself when he returned home. OT reminded pt of assistance provided previous day during BADLs.  Pt changed subject.  Pt's conversation was significantly tangential throughout session.  Focus on self feeding, orientation, attention to task, family education, and activity tolerance.   [name] [facility] 07/04/2015, 2:44 PM

## 2015-07-04 NOTE — Plan of Care (Signed)
Problem: RH SAFETY Goal: RH STG ADHERE TO SAFETY PRECAUTIONS W/ASSISTANCE/DEVICE STG Adhere to Safety Precautions With moderate Assistance/Device.  Outcome: Not Progressing Poor safety awareness..Called patient. Advised patient of provider's approval for requested procedure, as well as any comments/instructions from provider.   Provided patient w/ verbal instructions concerning pre-, intra- and post-procedure preparation and instructions.  Patient verbalized understanding of the above.   Patient has also been mailed a letter containing the following instructions :{AMB

## 2015-07-04 NOTE — Plan of Care (Signed)
Problem: RH PAIN MANAGEMENT Goal: RH STG PAIN MANAGED AT OR BELOW PT'S PAIN GOAL <6  Outcome: Not Progressing Pain 6/10

## 2015-07-04 NOTE — Progress Notes (Signed)
Nutrition Follow-up  DOCUMENTATION CODES:   Not applicable  INTERVENTION:  Discontinue Glucerna Shake.   Provide 30 ml Prostat po TID, each supplement provides 100 kcal and 15 grams of protein.   Provide nourishment snacks. (Magic cup, puddings)  Encourage adequate PO intake.   NUTRITION DIAGNOSIS:   Inadequate oral intake related to lethargy/confusion as evidenced by meal completion < 50%; ongoing  GOAL:   Patient will meet greater than or equal to 90% of their needs; not met  MONITOR:   PO intake, Supplement acceptance, Diet advancement, Weight trends, Labs, I & O's  REASON FOR ASSESSMENT:    (Supplements)    ASSESSMENT:   65 y.o. male with history fo HTN, DM type 2, PVCs, seizures who was seen in ED 06/10/15 with severe HA but CT head without acute changes. Late that day he had worsening of HA followed by N/V later with unresponsiveness. Patient intubated and sedated in ED. 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 Extubated  1/10  Meal completion has been varied from 10-100%, however most recent po intake has been 10-15%. Per RN, pt with poor po intake and has been refusing his Glucerna Shakes. Pt however has been consuming some puddings. Recommend providing puddings and Magic cups between meals. RD to additionally order Prostat to aid in caloric and protein needs. RD to continue to monitor.   Diet Order:  DIET - DYS 1 Room service appropriate?: Yes; Fluid consistency:: Nectar Thick  Skin:   (Incision on R head)  Last BM:  2/1  Height:   Ht Readings from Last 1 Encounters:  06/27/15 '5\' 11"'  (1.803 m)    Weight:   Wt Readings from Last 1 Encounters:  07/04/15 162 lb 11.2 oz (73.8 kg)    Ideal Body Weight:  78 kg  BMI:  Body mass index is 22.7 kg/(m^2).  Estimated Nutritional Needs:   Kcal:  2000-2200  Protein:  90-110 grams  Fluid:  2- 2.2 L/day  EDUCATION NEEDS:   No education needs identified  at this time  Corrin Parker, MS, RD, LDN Pager # [phone] After hours/ weekend pager # [phone]

## 2015-07-04 NOTE — Progress Notes (Signed)
Speech Language Pathology Daily Session Note  Patient Details  Name: Darryl [name] MRN: [id number] Date of Birth: [birth year]  Today's Date: 07/04/2015 SLP Individual Time: 4098-1191 SLP Individual Time Calculation (min): 59 min  Short Term Goals: Week 1: SLP Short Term Goal 1 (Week 1): Patient will orient to time, place and situation with Mod A question and verbal cues.  SLP Short Term Goal 2 (Week 1): Patient will demonstrate sustained attention to functional tasks for 5 minutes with Mod A verbal cues for redirection. SLP Short Term Goal 3 (Week 1): Patient will identify 1 physical and 1 cognitive deficit with Mod A verbal and question cues.  SLP Short Term Goal 4 (Week 1): Patient will consume current diet with minimal overt s/s of aspiration with Mod A verbal cues for use of swallowing compensatory strategies.  SLP Short Term Goal 5 (Week 1): Patient will consume trials of Dys. 2 textures and demonstrate efficent mastication with minimal oral residue with Min A verbal cues to self-monitor and correct oral residue over 2 consecutive sessions prior to upgrade.  SLP Short Term Goal 6 (Week 1): Patient will consume trials of thin liquids via cup with minimal overt s/s of aspiration with Min A verbal cues for use of swallowing compensatory strategies over 2 consecutive sessions prior to repeat MBS for possible upgrade.   Skilled Therapeutic Interventions: Pt seen upright in wheelchair. Pleasant. Pain became a detraction from therapy and RN was made aware and pt received meds with "some" relief. Mild- mod agitation noted with acceleration of pain. Pt with intermittent periods of language of confusion, but was generally able to reorient with structure. Pt Ox self and stroke. He knew he had been to the hospital, but thought he was currently at [employer]. Pt had significant difficulty recalling the number of children he has as well as their names. Unable to confirm accuracy of received information. Able to count  4-5 coins accurately, but with an increased number of coins, accuracy decreased to 0% unless mod A by therapist of managing the activity at a step by step level. Pt was trialed with thin water following oral care and was able to demonstrate small sips with consistent verbal cues. No s/s aspiration. Of note, RN trialed his meds whole with pudding. The pt was able to pocket the meds and then expectorate them after the RN left the room.    Function:    Cognition Comprehension Comprehension assist level: Understands basic 50 - 74% of the time/ requires cueing 25 - 49% of the time  Expression   Expression assist level: Expresses basic 50 - 74% of the time/requires cueing 25 - 49% of the time. Needs to repeat parts of sentences.  Social Interaction Social Interaction assist level: Interacts appropriately 50 - 74% of the time - May be physically or verbally inappropriate.  Problem Solving Problem solving assist level: Solves basic less than 25% of the time - needs direction nearly all the time or does not effectively solve problems and may need a restraint for safety  Memory Memory assist level: Recognizes or recalls less than 25% of the time/requires cueing greater than 75% of the time    Pain Pain Assessment Pain Assessment: 0-10 Pain Score: 8  Pain Type: Acute pain Pain Location:  (head, back and neck- pt unable to differentiate pain levels between sites) Pain Descriptors / Indicators: Aching Pain Onset: On-going Pain Intervention(s): RN made aware Multiple Pain Sites: Yes  Therapy/Group: Individual Therapy  [place] 07/04/2015, 3:20  PM   

## 2015-07-04 NOTE — Progress Notes (Signed)
Physical Therapy Weekly Progress Note  Patient Details  Name: Darryl [name] MRN: [id number] Date of Birth: 1950-11-17  Beginning of progress report period: June 28, 2015 End of progress report period: July 04, 2015  Today's Date: 07/04/2015 PT Individual Time: 0800-0900 PT Individual Time Calculation (min): 60 min   Patient has met 2 of 5 short term goals. Patient's progress has been inconsistent this reporting period with fluctuating cognition and limited by hip and knee pain and decreased standing/OOB tolerance.   Patient continues to demonstrate the following deficits: muscle weakness, muscle tightness, decreased endurance, decreased orientation, decreased attention, decreased awareness, decreased problem solving, decreased safety awareness, decreased memory, decreased sitting balance, decreased standing balance, decreased postural control, and decreased balance strategies and therefore will continue to benefit from skilled PT intervention to enhance overall performance with activity tolerance, balance, postural control, ability to compensate for deficits, functional use of  right upper extremity, right lower extremity, left upper extremity and left lower extremity, attention, awareness and coordination.  Patient progressing toward long term goals.  Continue plan of care.  PT Short Term Goals Week 1:  PT Short Term Goal 1 (Week 1): Patient will ambulate 50 ft using LRAD with assist of 1 person.  PT Short Term Goal 1 - Progress (Week 1): Not met PT Short Term Goal 2 (Week 1): Patient will transfer bed <> wheelchair with mod A x 1.  PT Short Term Goal 2 - Progress (Week 1): Met PT Short Term Goal 3 (Week 1): Patient will negotiate up/down 12 stairs with assist of 1 person.  PT Short Term Goal 3 - Progress (Week 1): Not met PT Short Term Goal 4 (Week 1): Patient will propel wheelchair x 150 ft with supervision for strengthening/endurance.  PT Short Term Goal 4 - Progress (Week 1):  Met PT Short Term Goal 5 (Week 1): Patient will maintain standing balance during functional task x 3 min with assist of 1 person.  PT Short Term Goal 5 - Progress (Week 1): Not met Week 2:  PT Short Term Goal 1 (Week 2): Patient will ambulate 50 ft using LRAD with assist of one person.  PT Short Term Goal 2 (Week 2): Patient will transfer bed <> wheelchair with min A.  PT Short Term Goal 3 (Week 2): Patient will negotiate up/down 12 stairs with assist of 1 person.  PT Short Term Goal 4 (Week 2): Patient will maintain standing balance during functional task x 3 min.  Skilled Therapeutic Interventions/Progress Updates:   Co-treatment with recreational therapist to improve participation with functional tasks and provide max encouragement. Patient awake in enclosure bed upon arrival with no clothing donned. Patient oriented to place and approximate month, re-oriented to situation and time. Patient agreeable to donning clothes, required max encouragement and increased time with setup assist for UB dressing with min A for supine > sit and total A for LB dressing with multiple attempts at sit <> stand to pull up brief/pants, patient refusing to attempt any movement until he "gets my head straight." Eventually patient performed sit > stand with min A using RW to pull up clothing. Performed squat pivot transfer with mod A and cues for safety and technique and patient propelled wheelchair from room to and from day room with supervision and hand over hand assist for safe hand placement as patient attempting to propel wheelchair from spokes. Patient consumed 10% of breakfast meal of Dys 2 textures and nectar thick liquid, patient perseverating on UE tremors while eating and  needing to "get my mind right," educated patient on progress to date and benefits of functional mobility. Patient declined ambulation stating, "What good will it do to walk if my mind isn't straight?" Patient left sitting in wheelchair with quick  release belt on at RN station.   Therapy Documentation Precautions:  Precautions Precautions: Fall Restrictions Weight Bearing Restrictions: No Pain: Pain Assessment Faces Pain Scale: Hurts even more Pain Type: Acute pain Pain Location: Head Pain Descriptors / Indicators: Aching;Headache Pain Intervention(s): Medication (See eMAR)   See Function Navigator for Current Functional Status.  Therapy/Group: Individual Therapy  Laretta Alstrom 07/04/2015, 9:02 AM

## 2015-07-04 NOTE — Progress Notes (Signed)
[place] PHYSICAL MEDICINE & REHABILITATION     PROGRESS NOTE    Subjective/Complaints: Slept the majority of the night. In enclosure bed "i'm getting a nap". .   .  ROS limited by cognition and behavior.  Objective: Vital Signs: Blood pressure 131/95, pulse 90, temperature 98.1 F (36.7 C), temperature source Oral, resp. rate 18, height  (1.803 m), SpO2 98 %. No results found. No results for input(s): WBC, HGB, HCT, PLT in the last 72 hours. No results for input(s): NA, K, CL, GLUCOSE, BUN, CREATININE, CALCIUM in the last 72 hours.  Invalid input(s): CO CBG (last 3)   Recent Labs  07/03/15 2051 07/04/15 0642 07/04/15 0834  GLUCAP 138* 159* 142*    Wt Readings from Last 3 Encounters:  06/13/15 89.6 kg (197 lb 8.5 oz)  06/08/15 91.627 kg (202 lb)  05/17/15 86.456 kg (190 lb 9.6 oz)    Physical Exam:  Constitutional: He appears well-developed and well-nourished.  HENT:  Head: Normocephalic and atraumatic.  Small incision on scalp with a couple of sutures and staples--+/- soft. Tender to touch.  Eyes: Conjunctivae are normal. Pupils are equal, round, and reactive to light.  Neck: Neck supple.  Limited ROM laterally and flexion limited due to pain.  Cardiovascular: Normal rate and regular rhythm.  Respiratory: Effort normal and breath sounds normal. No stridor. No respiratory distress. He has no wheezes.  GI: Soft. Bowel sounds are normal. He exhibits no distension. There is no tenderness.  Musculoskeletal: He exhibits no edema.  No tenderness in extremeties  Neurological: He is alert.  A&Ox2, name and city. Needed cues for hospital today  Remains distracted and lacks insight and awareness of deficits. perseverative He was able to follow one and occasional two step motor commands.   Motor: B/L UE 4/5 B/L hip flexion 4-/5, ankle dorsi/plantar flexion 4+/5 ---moves all 4's without obvious preference this am. Still too distractive to perform full MMT   Skin: Skin is warm and dry.  Psychiatric: Cognition and memory are impaired. He expresses inappropriate judgment. He is inattentive.     Assessment/Plan:  1. Cognitive, swallowing, and mobility deficits secondary to [facility] which require 3+ hours per day of interdisciplinary therapy in a comprehensive inpatient rehab setting. Physiatrist is providing close team supervision and 24 hour management of active medical problems listed below. Physiatrist and rehab team continue to assess barriers to discharge/monitor patient progress toward functional and medical goals.  Function:  Bathing Bathing position   Position: Wheelchair/chair at sink  Bathing parts Body parts bathed by patient: Right arm, Left arm, Chest, Abdomen, Front perineal area, Right upper leg, Left upper leg, Buttocks Body parts bathed by helper: Right lower leg, Left lower leg  Bathing assist Assist Level: Touching or steadying assistance(Pt > 75%)      Upper Body Dressing/Undressing Upper body dressing   What is the patient wearing?: Pull over shirt/dress     Pull over shirt/dress - Perfomed by patient: Thread/unthread right sleeve, Thread/unthread left sleeve, Put head through opening Pull over shirt/dress - Perfomed by helper: Pull shirt over trunk        Upper body assist Assist Level: Set up, Supervision or verbal cues   Set up : To obtain clothing/put away  Lower Body Dressing/Undressing Lower body dressing   What is the patient wearing?: Pants, Shoes Underwear - Performed by patient: Thread/unthread right underwear leg, Thread/unthread left underwear leg Underwear - Performed by helper: Thread/unthread right underwear leg, Thread/unthread left underwear leg, Pull underwear  up/down Pants- Performed by patient: Thread/unthread right pants leg, Thread/unthread left pants leg Pants- Performed by helper: Pull pants up/down Non-skid slipper socks- Performed by patient: Don/doff right sock Non-skid slipper socks-  Performed by helper: Don/doff right sock, Don/doff left sock     Shoes - Performed by patient: Don/doff right shoe, Don/doff left shoe, Fasten right Shoes - Performed by helper: Fasten left       TED Hose - Performed by helper: Don/doff right TED hose, Don/doff left TED hose  Lower body assist Assist for lower body dressing: Supervision or verbal cues (Bed level)      Toileting Toileting   Toileting steps completed by patient: Performs perineal hygiene Toileting steps completed by helper: Adjust clothing prior to toileting, Performs perineal hygiene, Adjust clothing after toileting    Toileting assist Assist level: Two helpers   Transfers Chair/bed transfer   Chair/bed transfer method: Squat pivot Chair/bed transfer assist level: Moderate assist (Pt 50 - 74%/lift or lower) Chair/bed transfer assistive device: Armrests     Locomotion Ambulation Ambulation activity did not occur: Refused   Max distance: 35 Assist level: 2 helpers (mod-max A overall, +2 for w/c follow)   Wheelchair   Type: Manual Max wheelchair distance: 150 Assist Level: Supervision or verbal cues  Cognition Comprehension Comprehension assist level: Understands basic 50 - 74% of the time/ requires cueing 25 - 49% of the time  Expression Expression assist level: Expresses basic 50 - 74% of the time/requires cueing 25 - 49% of the time. Needs to repeat parts of sentences.  Social Interaction Social Interaction assist level: Interacts appropriately 50 - 74% of the time - May be physically or verbally inappropriate.  Problem Solving Problem solving assist level: Solves basic 25 - 49% of the time - needs direction more than half the time to initiate, plan or complete simple activities  Memory Memory assist level: Recognizes or recalls 25 - 49% of the time/requires cueing 50 - 75% of the time   Medical Problem List and Plan: 1. 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 Cognitive and behavioral issues remain in forefront   2. LLE DVT: eliquis. No bleeding sequelae 3. Pain Management:  oxycodone prn for headaches and back/leg pain.   -see #15 below 4. Mood: LCSW to follow for evaluation and support.  5. Neuropsych: This patient is not  capable of making decisions on his own behalf.  -continue to provide appropriate environment for sleep and communication  -avoid overstimulating  -  sleep chart  -introduced ritalin for attention 5mg  bid 6. Skin/Wound Care: routine pressure relief measures.  7. Fluids/Electrolytes/Nutrition: Has been poor in part due to lethargy. Added nutritional supplements. Monitor for now.  .  -encourage PO 8. HTN: Monitor BP tid   -resumed low dose Cozaar.   -control had been fair---obsv for now 9. DM type 2: Monitor BS ac/hs basis. Po intake has been variable due to lethargy and cognitive issues. Wife encouraged to bring pureed foods from home.   -continue to hold metformin and use SSI for now. sugars remain under control at present 10. Seizures disorder: Was on lamictal bid prior to admission. On keppra bid which has been changed to PO 11. ABLA: continue iron supplement..  12. Mild ileus:   two Senna S bid. Use enema prn.    13. Hypokalemia: Supplement to avoid recurrence of ileus.  14. Leucocytosis: with complaints of back pain. Blood  work pending as is UA/CX.  -he is afebrile  15. Chronic right knee pain/lower ext pain: Off NSAIDs.   -voltaren gel, prn ICE  -rx DVT 16. PVCs: Negative cardiac work up 05/2015 17. Safety:   enclosure bed for fall prevention/safety is still required   -order renewed again today   LOS (Days) 7 A FACE TO FACE EVALUATION WAS PERFORMED  SWARTZ,ZACHARY T 07/04/2015 9:10 AM

## 2015-07-04 NOTE — Progress Notes (Addendum)
Speech Language Pathology Make Up Session Note  Patient Details  Name: Darryl [name] MRN: [id number] Date of Birth: December 23, 1950  Today's Date: 07/04/2015 SLP Individual Time: 1500-1530 SLP Individual Time Calculation (min): 30 min  Short Term Goals: Week 1: SLP Short Term Goal 1 (Week 1): Patient will orient to time, place and situation with Mod A question and verbal cues.  SLP Short Term Goal 2 (Week 1): Patient will demonstrate sustained attention to functional tasks for 5 minutes with Mod A verbal cues for redirection. SLP Short Term Goal 3 (Week 1): Patient will identify 1 physical and 1 cognitive deficit with Mod A verbal and question cues.  SLP Short Term Goal 4 (Week 1): Patient will consume current diet with minimal overt s/s of aspiration with Mod A verbal cues for use of swallowing compensatory strategies.  SLP Short Term Goal 5 (Week 1): Patient will consume trials of Dys. 2 textures and demonstrate efficent mastication with minimal oral residue with Min A verbal cues to self-monitor and correct oral residue over 2 consecutive sessions prior to upgrade.  SLP Short Term Goal 6 (Week 1): Patient will consume trials of thin liquids via cup with minimal overt s/s of aspiration with Min A verbal cues for use of swallowing compensatory strategies over 2 consecutive sessions prior to repeat MBS for possible upgrade.   Skilled Therapeutic Interventions: Pt was seen for skilled ST make up session targeting cognitive and dysphagia goals.  SLP facilitated the session with trials of dys 2 textures and nectar thick liquids to continue working towards diet progression.  Pt consumed the abovementioned trials with overall mod assist verbal cues for redirection to task.  No overt s/s of aspiration were evident with solids or liquids and pt demonstrated effective clearance of residual solids from the oral cavity with increased time.  Pt required max assist verbal cues to recall orientation information for  date and situation after a delay of ~1-2 minutes and multiple repetitions of information via spaced retrieval training.  Pt was returned to room and left in enclosure bed with call bell left within reach.  Enclosure bed left open as pt's room has now been moved closer to the nursing station to trial removal of restraints.  Continue per current plan of care.    Function:  Eating Eating   Modified Consistency Diet: Yes Eating Assist Level: Set up assist for;Supervision or verbal cues   Eating Set Up Assist For: Opening containers       Cognition Comprehension Comprehension assist level: Understands basic 50 - 74% of the time/ requires cueing 25 - 49% of the time  Expression   Expression assist level: Expresses basic 50 - 74% of the time/requires cueing 25 - 49% of the time. Needs to repeat parts of sentences.  Social Interaction Social Interaction assist level: Interacts appropriately 50 - 74% of the time - May be physically or verbally inappropriate.  Problem Solving Problem solving assist level: Solves basic less than 25% of the time - needs direction nearly all the time or does not effectively solve problems and may need a restraint for safety  Memory Memory assist level: Recognizes or recalls less than 25% of the time/requires cueing greater than 75% of the time    Pain Pain Assessment Pain Assessment: No/denies pain  Therapy/Group: Individual Therapy  Mayline Dragon, Melanee Spry 07/04/2015, 3:43 PM

## 2015-07-05 ENCOUNTER — Inpatient Hospital Stay (HOSPITAL_COMMUNITY): Payer: BC Managed Care – PPO | Admitting: Physical Therapy

## 2015-07-05 ENCOUNTER — Inpatient Hospital Stay (HOSPITAL_COMMUNITY): Payer: BC Managed Care – PPO | Admitting: Speech Pathology

## 2015-07-05 ENCOUNTER — Inpatient Hospital Stay (HOSPITAL_COMMUNITY): Payer: BC Managed Care – PPO | Admitting: *Deleted

## 2015-07-05 LAB — URINALYSIS, ROUTINE W REFLEX MICROSCOPIC
Bilirubin Urine: NEGATIVE
Glucose, UA: NEGATIVE mg/dL
Ketones, ur: NEGATIVE mg/dL
Nitrite: NEGATIVE
Protein, ur: 100 mg/dL — AB
Specific Gravity, Urine: 1.023 (ref 1.005–1.030)
pH: 6 (ref 5.0–8.0)

## 2015-07-05 LAB — BASIC METABOLIC PANEL
Anion gap: 10 (ref 5–15)
BUN: 9 mg/dL (ref 6–20)
CO2: 26 mmol/L (ref 22–32)
Calcium: 9.7 mg/dL (ref 8.9–10.3)
Chloride: 104 mmol/L (ref 101–111)
Creatinine, Ser: 1.12 mg/dL (ref 0.61–1.24)
GFR calc Af Amer: >60 mL/min (ref >60)
GFR calc non Af Amer: >60 mL/min (ref >60)
Glucose, Bld: 163 mg/dL — ABNORMAL HIGH (ref 65–99)
Potassium: 4.6 mmol/L (ref 3.5–5.1)
Sodium: 140 mmol/L (ref 135–145)

## 2015-07-05 LAB — GLUCOSE, CAPILLARY
Glucose-Capillary: 156 mg/dL — ABNORMAL HIGH (ref 65–99)
Glucose-Capillary: 128 mg/dL — ABNORMAL HIGH (ref 65–99)
Glucose-Capillary: 157 mg/dL — ABNORMAL HIGH (ref 65–99)
Glucose-Capillary: 139 mg/dL — ABNORMAL HIGH (ref 65–99)

## 2015-07-05 LAB — CBC
HCT: 38.7 % — ABNORMAL LOW (ref 39.0–52.0)
Hemoglobin: 13.2 g/dL (ref 13.0–17.0)
MCH: 26.3 pg (ref 26.0–34.0)
MCHC: 34.1 g/dL (ref 30.0–36.0)
MCV: 77.1 fL — ABNORMAL LOW (ref 78.0–100.0)
Platelets: 336 10*3/uL (ref 150–400)
RBC: 5.02 MIL/uL (ref 4.22–5.81)
RDW: 14.8 % (ref 11.5–15.5)
WBC: 7 10*3/uL (ref 4.0–10.5)

## 2015-07-05 LAB — URINE MICROSCOPIC-ADD ON

## 2015-07-05 MED ORDER — METHYLPHENIDATE HCL 5 MG PO TABS
10.0000 mg | ORAL_TABLET | Freq: Two times a day (BID) | ORAL | Status: DC
  Administered 2015-07-05 – 2015-07-11 (×12): 10 mg via ORAL
  Filled 2015-07-05 (×12): qty 2

## 2015-07-05 NOTE — Progress Notes (Signed)
Occupational Therapy Session Note  Patient Details  Name: Darryl [name] MRN: [id number] Date of Birth: December 27, 1950  Today's Date: 07/05/2015 OT Individual Time: 7829-5621 OT Individual Time Calculation (min): 75 min    Short Term Goals: Week 2:  OT Short Term Goal 1 (Week 2): Pt will transfer to 3 in 1 over toilet with min A. OT Short Term Goal 2 (Week 2): Pt will transfer to shower seat with min A. OT Short Term Goal 3 (Week 2): Pt will sit to stand with min A to prepare for LB self care. OT Short Term Goal 4 (Week 2): Pt will manage clothing over hips for toileting tasks.  Skilled Therapeutic Interventions/Progress Updates:    Pt resting in recliner upon arrival and agreeable to participating in therapy.  Pt engaged in BADL retraining including bathing at shower level, dressing with sit<>stand from w/c and self feeding.  Pt performed initial two transfers with mod A and min verbal cues for sequencing.  Pt required max verbal cues for task initiation during bathing at shower level.  Pt required max A and max verbal cues for sequencing and motor planning during stand pivot from shower to w/c.  Pt initiated dressing tasks when presented with clothing items.  Pt's conversation throughout session was tangential and at times incoherent.  When challenged that his conversation did not make any sense, pt would attempt to make a joke.  Pt engaged in self feeding tasks with assistance to open containers.  Pt remained in recliner with QRB in place and all needs within reach.  RN notified to assist patient with self feeding.   Therapy Documentation Precautions:  Precautions Precautions: Fall Restrictions Weight Bearing Restrictions: No Pain: Pain Assessment Pain Assessment: Faces Pain Score: Asleep Faces Pain Scale: Hurts even more Pain Location: Generalized Pain Descriptors / Indicators: Aching Pain Onset: On-going Pain Intervention(s): Repositioned  See Function Navigator for Current Functional  Status.   Therapy/Group: Individual Therapy  Rich Brave 07/05/2015, 8:18 AM

## 2015-07-05 NOTE — Progress Notes (Signed)
Social Work Patient ID: Jennye Moccasin, male   DOB: December 27, 1950, 65 y.o.   MRN: [id number]   Met with pt's wife yesterday afternoon to review team conference.  Wife very concerned that further progress has not been made with his cognition.  Attempted to provide support and encouragement, however, explained that team shares these concerns and are hopeful that this next week will show more improvement.  Wife is concerned about her ability to care for him at home if he does not progress much further.   Contacted today by insurance CM who also states concern of limited progress.  Was agreeable to extend coverage for another week and await next team conference for update.  She also notes that it is NOT likely that insurance would consider SNF coverage as he has been on CIR.  Given this new information, I will speak with wife about planning a family conference next week as their children might be available, in order to make plans for "worst case scenario" on recovery.  Will keep team posted.  Dontrail Blackwell, LCSW

## 2015-07-05 NOTE — Progress Notes (Signed)
[place] PHYSICAL MEDICINE & REHABILITATION     PROGRESS NOTE    Subjective/Complaints: Up in chair. Trying to eat breakfast. Incontinent in bed.   .  ROS limited by cognition and behavior.   Objective: Vital Signs: Blood pressure 120/95, pulse 98, temperature 98 F (36.7 C), temperature source Oral, resp. rate 19, height  (1.803 m), weight 73.8 kg (162 lb 11.2 oz), SpO2 100 %. No results found.  Recent Labs  07/05/15 0639  WBC 7.0  HGB 13.2  HCT 38.7*  PLT 336   No results for input(s): NA, K, CL, GLUCOSE, BUN, CREATININE, CALCIUM in the last 72 hours.  Invalid input(s): CO CBG (last 3)   Recent Labs  07/04/15 1627 07/04/15 2112 07/05/15 0637  GLUCAP 167* 92 156*    Wt Readings from Last 3 Encounters:  07/04/15 73.8 kg (162 lb 11.2 oz)  06/13/15 89.6 kg (197 lb 8.5 oz)  06/08/15 91.627 kg (202 lb)    Physical Exam:  Constitutional: He appears well-developed and well-nourished.  HENT:  Head: Normocephalic and atraumatic.  Small incision on scalp with a couple of sutures and staples--+/- soft. Tender to touch.  Eyes: Conjunctivae are normal. Pupils are equal, round, and reactive to light.  Neck: Neck supple.  Limited ROM laterally and flexion limited due to pain.  Cardiovascular: Normal rate and regular rhythm.  Respiratory: Effort normal and breath sounds normal. No stridor. No respiratory distress. He has no wheezes.  GI: Soft. Bowel sounds are normal. He exhibits no distension. There is no tenderness.  Musculoskeletal: He exhibits no edema.  No tenderness in extremeties  Neurological: He is alert.  A&Ox2, name and hospital Remains distracted and lacks insight and awareness of deficits. Perseverative stil He was able to follow one and occasional two step motor commands.   Motor: B/L UE 4/5 B/L hip flexion 4-/5, ankle dorsi/plantar flexion 4+/5 ---moves all 4's without obvious preference this am.  Skin: Skin is warm and dry.  Psychiatric:  Cognition and memory are impaired. He expresses inappropriate judgment. He is inattentive.     Assessment/Plan:  1. Cognitive, swallowing, and mobility deficits secondary to [facility] which require 3+ hours per day of interdisciplinary therapy in a comprehensive inpatient rehab setting. Physiatrist is providing close team supervision and 24 hour management of active medical problems listed below. Physiatrist and rehab team continue to assess barriers to discharge/monitor patient progress toward functional and medical goals.  Function:  Bathing Bathing position   Position: Shower  Bathing parts Body parts bathed by patient: Right arm, Left arm, Chest, Abdomen, Front perineal area, Right upper leg, Left upper leg, Buttocks Body parts bathed by helper: Right lower leg, Left lower leg  Bathing assist Assist Level: Touching or steadying assistance(Pt > 75%)      Upper Body Dressing/Undressing Upper body dressing   What is the patient wearing?: Pull over shirt/dress     Pull over shirt/dress - Perfomed by patient: Thread/unthread right sleeve, Thread/unthread left sleeve, Put head through opening Pull over shirt/dress - Perfomed by helper: Pull shirt over trunk        Upper body assist Assist Level: Set up, Supervision or verbal cues   Set up : To obtain clothing/put away  Lower Body Dressing/Undressing Lower body dressing   What is the patient wearing?: Pants, Ted Hose, Shoes Underwear - Performed by patient: Thread/unthread right underwear leg, Thread/unthread left underwear leg Underwear - Performed by helper: Thread/unthread right underwear leg, Thread/unthread left underwear leg, Pull underwear up/down  Pants- Performed by patient: Thread/unthread right pants leg, Thread/unthread left pants leg, Pull pants up/down Pants- Performed by helper: Pull pants up/down Non-skid slipper socks- Performed by patient: Don/doff right sock Non-skid slipper socks- Performed by helper: Don/doff  right sock, Don/doff left sock     Shoes - Performed by patient: Don/doff right shoe, Don/doff left shoe Shoes - Performed by helper: Fasten right, Fasten left       TED Hose - Performed by helper: Don/doff right TED hose, Don/doff left TED hose  Lower body assist Assist for lower body dressing: Supervision or verbal cues (Bed level)      Toileting Toileting   Toileting steps completed by patient: Performs perineal hygiene Toileting steps completed by helper: Adjust clothing prior to toileting, Performs perineal hygiene, Adjust clothing after toileting Toileting Assistive Devices: Grab bar or rail  Toileting assist Assist level: Two helpers   Transfers Chair/bed transfer   Chair/bed transfer method: Squat pivot Chair/bed transfer assist level: Moderate assist (Pt 50 - 74%/lift or lower) Chair/bed transfer assistive device: Armrests     Locomotion Ambulation Ambulation activity did not occur: Refused   Max distance: 35 Assist level: 2 helpers (mod-max A overall, +2 for w/c follow)   Wheelchair   Type: Manual Max wheelchair distance: 150 Assist Level: Supervision or verbal cues  Cognition Comprehension Comprehension assist level: Understands basic 50 - 74% of the time/ requires cueing 25 - 49% of the time  Expression Expression assist level: Expresses basic 50 - 74% of the time/requires cueing 25 - 49% of the time. Needs to repeat parts of sentences.  Social Interaction Social Interaction assist level: Interacts appropriately 50 - 74% of the time - May be physically or verbally inappropriate.  Problem Solving Problem solving assist level: Solves basic less than 25% of the time - needs direction nearly all the time or does not effectively solve problems and may need a restraint for safety  Memory Memory assist level: Recognizes or recalls less than 25% of the time/requires cueing greater than 75% of the time   Medical Problem List and Plan: 1. 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 Cognitive and behavioral issues remain in forefront   2. LLE DVT: eliquis. No bleeding sequelae 3. Pain Management:  oxycodone prn for headaches and back/leg pain.   -see #15 below 4. Mood: LCSW to follow for evaluation and support.  5. Neuropsych: This patient is not  capable of making decisions on his own behalf.  -continue to provide appropriate environment for sleep and communication  -avoid overstimulating  -  sleep chart  -increae ritalin to  bid 6. Skin/Wound Care: routine pressure relief measures.  7. Fluids/Electrolytes/Nutrition: Has been poor in part due to lethargy. Added nutritional supplements. Monitor for now.  .  -encourage PO 8. HTN: Monitor BP tid   -resumed low dose Cozaar.   -control had been fair---obsv for now 9. DM type 2: Monitor BS ac/hs basis. Po intake has been variable due to lethargy and cognitive issues. Wife encouraged to bring pureed foods from home.   -continue to hold metformin and use SSI for now. sugars remain under control at present 10. Seizures disorder: Was on lamictal bid prior to admission. On keppra bid which has been changed to PO 11. ABLA: continue iron supplement..  12. Mild ileus:   two Senna S bid. Use enema prn.    13. Hypokalemia: Supplement to avoid recurrence  of ileus.  14. Leucocytosis: re-check ua/cx  -he is afebrile  15. Chronic right knee pain/lower ext pain: Off NSAIDs.   -voltaren gel, prn ICE  -rx DVT 16. PVCs: Negative cardiac work up 05/2015 17. Safety:   enclosure bed for fall prevention/safety is still required   -order renewed again today--would continue   LOS (Days) 8 A FACE TO FACE EVALUATION WAS PERFORMED  Rena Sweeden T 07/05/2015 9:07 AM

## 2015-07-05 NOTE — Progress Notes (Signed)
Speech Language Pathology Daily Session Note  Patient Details  Name: Darryl [name] MRN: [id number] Date of Birth: December 29, 1950  Today's Date: 07/05/2015 SLP Individual Time: 0900-1000 SLP Individual Time Calculation (min): 60 min  Short Term Goals: Week 1: SLP Short Term Goal 1 (Week 1): Patient will orient to time, place and situation with Mod A question and verbal cues.  SLP Short Term Goal 2 (Week 1): Patient will demonstrate sustained attention to functional tasks for 5 minutes with Mod A verbal cues for redirection. SLP Short Term Goal 3 (Week 1): Patient will identify 1 physical and 1 cognitive deficit with Mod A verbal and question cues.  SLP Short Term Goal 4 (Week 1): Patient will consume current diet with minimal overt s/s of aspiration with Mod A verbal cues for use of swallowing compensatory strategies.  SLP Short Term Goal 5 (Week 1): Patient will consume trials of Dys. 2 textures and demonstrate efficent mastication with minimal oral residue with Min A verbal cues to self-monitor and correct oral residue over 2 consecutive sessions prior to upgrade.  SLP Short Term Goal 6 (Week 1): Patient will consume trials of thin liquids via cup with minimal overt s/s of aspiration with Min A verbal cues for use of swallowing compensatory strategies over 2 consecutive sessions prior to repeat MBS for possible upgrade.   Skilled Therapeutic Interventions: Skilled treatment session focused on dysphagia and cognitive goals. SLP facilitated session by providing Min A verbal cues for initiation with oral care task via the suction toothbrush. Patient consumed trials of thin liquids via cup and demonstrated what appeared to be a timely swallow initiation without overt s/s of aspiration. Patient was also able to follow commands in regards to a cued cough/throat clear with 100% accuracy, therefore, recommend repeat MBS tomorrow to assess for possible upgrade. SLP also facilitated session by providing Max A  verbal cues for orientation to time, place and situation and overall Mod A verbal cues for redirection to functional tasks in 2 minute intervals in a mildly distracting environment and Min A verbal and question cues to identify 1 physical and 1 cognitive deficit. Patient with increased verbosity and language of confusion today. Patient left upright in recliner at RN station with quick release belt in place. Continue with current plan of care.    Function:  Eating Eating   Modified Consistency Diet: Yes Eating Assist Level: Supervision or verbal cues           Cognition Comprehension Comprehension assist level: Understands basic 50 - 74% of the time/ requires cueing 25 - 49% of the time  Expression   Expression assist level: Expresses basic 50 - 74% of the time/requires cueing 25 - 49% of the time. Needs to repeat parts of sentences.  Social Interaction Social Interaction assist level: Interacts appropriately 50 - 74% of the time - May be physically or verbally inappropriate.  Problem Solving Problem solving assist level: Solves basic less than 25% of the time - needs direction nearly all the time or does not effectively solve problems and may need a restraint for safety  Memory Memory assist level: Recognizes or recalls less than 25% of the time/requires cueing greater than 75% of the time    Pain Pain Assessment Faces Pain Scale: Hurts worst Pain Type: Acute pain Pain Location: Head Pain Descriptors / Indicators: Headache Pain Intervention(s): Pre-Medicated, distraction   Therapy/Group: Individual Therapy  Faris Coolman 07/05/2015, 2:07 PM

## 2015-07-05 NOTE — Progress Notes (Addendum)
Physical Therapy Session Note  Patient Details  Name: Darryl [name] MRN: [id number] Date of Birth: 09/09/1950  Today's Date: 07/05/2015 PT Individual Time: 1400-1505 PT Individual Time Calculation (min): 65 min   Short Term Goals: Week 2:  PT Short Term Goal 1 (Week 2): Patient will ambulate 50 ft using LRAD with assist of one person.  PT Short Term Goal 2 (Week 2): Patient will transfer bed <> wheelchair with min A.  PT Short Term Goal 3 (Week 2): Patient will negotiate up/down 12 stairs with assist of 1 person.  PT Short Term Goal 4 (Week 2): Patient will maintain standing balance during functional task x 3 min.  Skilled Therapeutic Interventions/Progress Updates:   Session focused on orientation, attention, awareness, strengthening, and activity tolerance with sit <> stand transfers with min-mod A and increased time, short distance ambulation using RW with max A and +2 for wheelchair follow limited by decreased safety and increased lateral lean to R, stair training up/down 4 (6") stairs using 2 rails with mod A and increased time with cues for safe sequencing and hand placement, standing trials using RW up to 1.5 min with multiple attempts to match one pair of cards on tray table, wheelchair propulsion using BUE up to 150 ft with supervision and cues to initiate propelling at wheelchair rim instead of spokes of wheels, and toilet transfer from wheelchair to and from commode using grab bar with max A with patient unable to void but able to manage brief and pants with max A for standing balance. Patient left sitting in wheelchair with quick release belt on at RN station. Patient required max cues for orientation to time, place, and situation and mod-max cues to sustain attention to functional tasks with mildly distracting environment. Patient demonstrated increased verbosity and language of confusion this session.   Therapy Documentation Precautions:  Precautions Precautions:  Fall Restrictions Weight Bearing Restrictions: No Pain: Pain Assessment Pain Assessment: Faces Faces Pain Scale: Hurts whole lot Pain Type: Acute pain Pain Location: Generalized Pain Descriptors / Indicators: Aching;Grimacing Pain Onset: With Activity Pain Intervention(s): Repositioned;Rest   See Function Navigator for Current Functional Status.   Therapy/Group: Individual Therapy  Kerney Elbe 07/05/2015, 4:55 PM

## 2015-07-06 ENCOUNTER — Inpatient Hospital Stay (HOSPITAL_COMMUNITY): Payer: BC Managed Care – PPO | Admitting: Occupational Therapy

## 2015-07-06 ENCOUNTER — Inpatient Hospital Stay (HOSPITAL_COMMUNITY): Payer: BC Managed Care – PPO | Admitting: Speech Pathology

## 2015-07-06 ENCOUNTER — Inpatient Hospital Stay (HOSPITAL_COMMUNITY): Payer: BC Managed Care – PPO | Admitting: Physical Therapy

## 2015-07-06 ENCOUNTER — Inpatient Hospital Stay (HOSPITAL_COMMUNITY): Payer: BC Managed Care – PPO

## 2015-07-06 LAB — GLUCOSE, CAPILLARY
Glucose-Capillary: 124 mg/dL — ABNORMAL HIGH (ref 65–99)
Glucose-Capillary: 127 mg/dL — ABNORMAL HIGH (ref 65–99)
Glucose-Capillary: 166 mg/dL — ABNORMAL HIGH (ref 65–99)
Glucose-Capillary: 97 mg/dL (ref 65–99)
Glucose-Capillary: 110 mg/dL — ABNORMAL HIGH (ref 65–99)

## 2015-07-06 LAB — URINE CULTURE

## 2015-07-06 NOTE — Progress Notes (Signed)
Physical Therapy Session Note  Patient Details  Name: Darryl [name] MRN: [id number] Date of Birth: November 02, 1950  Today's Date: 07/06/2015 PT Individual Time: 1015-1115 PT Individual Time Calculation (min): 60 min   Short Term Goals: Week 2:  PT Short Term Goal 1 (Week 2): Patient will ambulate 50 ft using LRAD with assist of one person.  PT Short Term Goal 2 (Week 2): Patient will transfer bed <> wheelchair with min A.  PT Short Term Goal 3 (Week 2): Patient will negotiate up/down 12 stairs with assist of 1 person.  PT Short Term Goal 4 (Week 2): Patient will maintain standing balance during functional task x 3 min.  Skilled Therapeutic Interventions/Progress Updates:   Session focused on sustained attention, orientation, problem solving, recall, and safety awareness with max A multimodal cues while engaging in familiar card game at high top table in standing with S-min guard up to 60 sec at a time before requesting/attempting to return to sitting, performing sit <> stand transfers with min A, wheelchair propulsion using BUE up to 150 ft with supervision, stand pivot transfer with mod A, and performing NuStep using BLE only at level 4 x 3.5 min and level 1 x 3 min with prolonged rest break between trials. Patient required increased time and encouragement for participation with all standing mobility. Patient with increased headache pain, RN administered pain medication at beginning of session. Patient left sitting in wheelchair with quick release belt on at RN station.   Therapy Documentation Precautions:  Precautions Precautions: Fall Restrictions Weight Bearing Restrictions: No Pain: Pain Assessment Pain Assessment: Faces Faces Pain Scale: Hurts worst Pain Type: Acute pain Pain Location: Head Pain Descriptors / Indicators: Headache Pain Onset: On-going Pain Intervention(s): RN made aware;Rest   See Function Navigator for Current Functional Status.   Therapy/Group: Individual  Therapy  Kerney Elbe 07/06/2015, 1:02 PM

## 2015-07-06 NOTE — Progress Notes (Signed)
[facility] PHYSICAL MEDICINE & REHABILITATION     PROGRESS NOTE    Subjective/Complaints: Working with therapy. No distress. Denies pain.    .  ROS limited by cognition and behavior.   Objective: Vital Signs: Blood pressure 122/70, pulse 75, temperature 98.4 F (36.9 C), temperature source Oral, resp. rate 16, height 5\' 11"  (1.803 m), weight 73.8 kg (162 lb 11.2 oz), SpO2 100 %. No results found.  Recent Labs  07/05/15 0639  WBC 7.0  HGB 13.2  HCT 38.7*  PLT 336    Recent Labs  07/05/15 0639  NA 140  K 4.6  CL 104  GLUCOSE 163*  BUN 9  CREATININE 1.12  CALCIUM 9.7   CBG (last 3)   Recent Labs  07/05/15 1600 07/05/15 2149 07/06/15 0647  GLUCAP 157* 139* 124*    Wt Readings from Last 3 Encounters:  07/04/15 73.8 kg (162 lb 11.2 oz)  06/13/15 89.6 kg (197 lb 8.5 oz)  06/08/15 91.627 kg (202 lb)    Physical Exam:  Constitutional: He appears well-developed and well-nourished.  HENT:  Head: Normocephalic and atraumatic.  Small incision on scalp with a couple of sutures and staples--+/- soft. Tender to touch.  Eyes: Conjunctivae are normal. Pupils are equal, round, and reactive to light.  Neck: Neck supple.  Limited ROM laterally and flexion limited due to pain.  Cardiovascular: Normal rate and regular rhythm.  Respiratory: Effort normal and breath sounds normal. No stridor. No respiratory distress. He has no wheezes.  GI: Soft. Bowel sounds are normal. He exhibits no distension. There is no tenderness.  Musculoskeletal: He exhibits no edema.  No tenderness in extremeties  Neurological: He is alert.  A&Ox2, name and hospital Remains distracted and lacks insight and awareness of deficits. Perseverative stil He was able to follow one and occasional two step motor commands.   Motor: B/L UE 4/5 B/L hip flexion 4-/5, ankle dorsi/plantar flexion 4+/5 ---moves all 4's without obvious preference this am.  Skin: Skin is warm and dry.  Psychiatric:  Cognition and memory are impaired. He expresses inappropriate judgment. He is inattentive.     Assessment/Plan:  1. Cognitive, swallowing, and mobility deficits secondary to [facility] which require 3+ hours per day of interdisciplinary therapy in a comprehensive inpatient rehab setting. Physiatrist is providing close team supervision and 24 hour management of active medical problems listed below. Physiatrist and rehab team continue to assess barriers to discharge/monitor patient progress toward functional and medical goals.  Function:  Bathing Bathing position   Position: Wheelchair/chair at sink  Bathing parts Body parts bathed by patient: Right arm, Left arm, Chest, Abdomen, Front perineal area, Right upper leg, Left upper leg, Buttocks, Right lower leg, Left lower leg Body parts bathed by helper: Back  Bathing assist Assist Level: Touching or steadying assistance(Pt > 75%) (during sit to/from stands)      Upper Body Dressing/Undressing Upper body dressing   What is the patient wearing?: Pull over shirt/dress     Pull over shirt/dress - Perfomed by patient: Thread/unthread right sleeve, Thread/unthread left sleeve, Put head through opening Pull over shirt/dress - Perfomed by helper: Pull shirt over trunk        Upper body assist Assist Level: Set up, Supervision or verbal cues   Set up : To obtain clothing/put away  Lower Body Dressing/Undressing Lower body dressing   What is the patient wearing?: Pants, Ted Hose, Shoes Underwear - Performed by patient: Thread/unthread right underwear leg, Thread/unthread left underwear leg Underwear -  Performed by helper: Thread/unthread right underwear leg, Thread/unthread left underwear leg, Pull underwear up/down Pants- Performed by patient: Thread/unthread right pants leg, Thread/unthread left pants leg, Pull pants up/down Pants- Performed by helper: Pull pants up/down Non-skid slipper socks- Performed by patient: Don/doff right sock,  Don/doff left sock Non-skid slipper socks- Performed by helper: Don/doff right sock, Don/doff left sock     Shoes - Performed by patient: Don/doff right shoe, Don/doff left shoe, Fasten right, Fasten left Shoes - Performed by helper: Fasten right, Fasten left       TED Hose - Performed by helper: Don/doff right TED hose, Don/doff left TED hose  Lower body assist Assist for lower body dressing: Supervision or verbal cues      Toileting Toileting Toileting activity did not occur: N/A Toileting steps completed by patient: Performs perineal hygiene Toileting steps completed by helper: Adjust clothing prior to toileting, Adjust clothing after toileting Toileting Assistive Devices: Grab bar or rail  Toileting assist Assist level: Touching or steadying assistance (Pt.75%)   Transfers Chair/bed transfer   Chair/bed transfer method: Stand pivot Chair/bed transfer assist level: Touching or steadying assistance (Pt > 75%) Chair/bed transfer assistive device: Armrests     Locomotion Ambulation Ambulation activity did not occur: Refused   Max distance: 5 Assist level: 2 helpers   Wheelchair   Type: Manual Max wheelchair distance: 150 Assist Level: Supervision or verbal cues  Cognition Comprehension Comprehension assist level: Understands basic 25 - 49% of the time/ requires cueing 50 - 75% of the time  Expression Expression assist level: Expresses basic 25 - 49% of the time/requires cueing 50 - 75% of the time. Uses single words/gestures.  Social Interaction Social Interaction assist level: Interacts appropriately 50 - 74% of the time - May be physically or verbally inappropriate.  Problem Solving Problem solving assist level: Solves basic 25 - 49% of the time - needs direction more than half the time to initiate, plan or complete simple activities  Memory Memory assist level: Recognizes or recalls less than 25% of the time/requires cueing greater than 75% of the time   Medical Problem  List and Plan: 1. 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 Cognitive and behavioral issues remain in forefront   2. LLE DVT: eliquis. No bleeding sequelae 3. Pain Management:  oxycodone prn for headaches and back/leg pain.   -see #15 below 4. Mood: LCSW to follow for evaluation and support.  5. Neuropsych: This patient is not  capable of making decisions on his own behalf.  -continue to provide appropriate environment for sleep and communication  -environmental mod  -  sleep chart  -increased ritalin to  bid 6. Skin/Wound Care: routine pressure relief measures.  7. Fluids/Electrolytes/Nutrition: intake inconsistent. Added nutritional supplements. Monitor for now.  .  -encourage PO 8. HTN: Monitor BP tid   -resumed low dose Cozaar.   -control had fair 9. DM type 2: Monitor BS ac/hs basis. Po intake has been variable due to lethargy and cognitive issues. Wife encouraged to bring pureed foods from home.   -continue to hold metformin and use SSI for now. sugars remain under control 10. Seizures disorder: Was on lamictal bid prior to admission. On keppra bid which has been changed to PO 11. ABLA: continue iron supplement..  12. Mild ileus:   two Senna S bid. Use enema prn.    13. Hypokalemia: Supplement to avoid recurrence of ileus.  14.  Leucocytosis: ua neg, cx pending  -he is afebrile  15. Chronic right knee pain/lower ext pain: improving   -voltaren gel, prn ICE  16. PVCs: Negative cardiac work up 05/2015 17. Safety:   enclosure bed for fall prevention/safety is still required   -order renewed again today--would continue through the weekend   LOS (Days) 9 A FACE TO FACE EVALUATION WAS PERFORMED  Tricia Pledger T 07/06/2015 10:14 AM

## 2015-07-06 NOTE — Progress Notes (Signed)
Occupational Therapy Session Note  Patient Details  Name: Darryl [name] MRN: [id number] Date of Birth: Feb 06, 1951  Today's Date: 07/06/2015 OT Individual Time: 1430-1500 OT Individual Time Calculation (min): 30 min    Short Term Goals: Week 2:  OT Short Term Goal 1 (Week 2): Pt will transfer to 3 in 1 over toilet with min A. OT Short Term Goal 2 (Week 2): Pt will transfer to shower seat with min A. OT Short Term Goal 3 (Week 2): Pt will sit to stand with min A to prepare for LB self care. OT Short Term Goal 4 (Week 2): Pt will manage clothing over hips for toileting tasks.  Skilled Therapeutic Interventions/Progress Updates:    1:1 Pt received in [place] bed. Focus of session on orientation, attention, functional problem solving etc.  Pt came to EOB with min A with VC for sequence and task organization.  Pt transferred bed to w/c after 2 attempts with min A ( pt resistant to allow help initially). Pt engaged in performing meal setup including fixing a cup of coffee.  Pt required extra time and questioning cues to prompt decision making. Pt with poor po intake - requiring mod encouragement to eat 15% of lunch. Initially pt not oriented to time of day, year or place. However once in dayroom at table with environmental cues- pt oriented to place.  Pt thought it was 2002, breakfast time and unsure why he was at [facility] cone. Pt demonstrated sustained attention during meal in a quiet environment with min cues.   Therapy Documentation Precautions:  Precautions Precautions: Fall Restrictions Weight Bearing Restrictions: No Pain: No c/o pain See Function Navigator for Current Functional Status.   Therapy/Group: Individual Therapy  Roney Mans [facility] 07/06/2015, 3:08 PM

## 2015-07-06 NOTE — Progress Notes (Signed)
Speech Language Pathology Weekly Progress and Session Note  Patient Details  Name: Darryl [name] MRN: [id number] Date of Birth: 31-Aug-1950  Beginning of progress report period: June 27, 2015 End of progress report period: July 06, 2015  Today's Date: 07/06/2015 SLP Individual Time: 0800-0900 SLP Individual Time Calculation (min): 60 min  Short Term Goals: Week 1: SLP Short Term Goal 1 (Week 1): Patient will orient to time, place and situation with Mod A question and verbal cues.  SLP Short Term Goal 1 - Progress (Week 1): Not met SLP Short Term Goal 2 (Week 1): Patient will demonstrate sustained attention to functional tasks for 5 minutes with Mod A verbal cues for redirection. SLP Short Term Goal 2 - Progress (Week 1): Not met SLP Short Term Goal 3 (Week 1): Patient will identify 1 physical and 1 cognitive deficit with Mod A verbal and question cues.  SLP Short Term Goal 3 - Progress (Week 1): Met SLP Short Term Goal 4 (Week 1): Patient will consume current diet with minimal overt s/s of aspiration with Mod A verbal cues for use of swallowing compensatory strategies.  SLP Short Term Goal 4 - Progress (Week 1): Met SLP Short Term Goal 5 (Week 1): Patient will consume trials of Dys. 2 textures and demonstrate efficent mastication with minimal oral residue with Min A verbal cues to self-monitor and correct oral residue over 2 consecutive sessions prior to upgrade.  SLP Short Term Goal 5 - Progress (Week 1): Not met SLP Short Term Goal 6 (Week 1): Patient will consume trials of thin liquids via cup with minimal overt s/s of aspiration with Min A verbal cues for use of swallowing compensatory strategies over 2 consecutive sessions prior to repeat MBS for possible upgrade.  SLP Short Term Goal 6 - Progress (Week 1): Met    New Short Term Goals: Week 2: SLP Short Term Goal 1 (Week 2): Patient will orient to time, place and situation with Mod A question and verbal cues.  SLP Short Term Goal  2 (Week 2): Patient will demonstrate sustained attention to functional tasks for 5 minutes with Mod A verbal cues for redirection. SLP Short Term Goal 3 (Week 2): Patient will identify 1 physical and 1 cognitive deficit with Min A verbal and question cues.  SLP Short Term Goal 4 (Week 2): Patient will utilize call bell to request assistance with Max A multimodal cues in 25% of opportunities.  SLP Short Term Goal 5 (Week 2): Patient will consume trials of Dys. 2 textures and demonstrate efficent mastication with minimal oral residue with Min A verbal cues to self-monitor and correct oral residue over 2 consecutive sessions prior to upgrade.  SLP Short Term Goal 6 (Week 2): Patient will consume current diet with minimal overt s/s of aspiration with Min A verbal cues for use of swallowing compensatory strategies.   Weekly Progress Updates: Patient has made functional but inconsistent gains and has met 3 of 6 STG's this reporting period due to improved swallowing and cognitive function. Patient had repeat MBS today and is currently consuming Dys. 1 textures with thin liquids and requires full supervision for safety and encouragement with PO intake. Patient also requires overall Total A for safety awareness and recall of new information, Max A multimodal cues for sustained attention, functional problem solving, intellectual awareness of deficits and orientation. Patient and family education is ongoing. Patient would benefit from continued skilled SLP intervention to maximize cognitive and swallowing function and overall functional independence prior to  discharge.     Intensity: Minumum of 1-2 x/day, 30 to 90 minutes Frequency: 3 to 5 out of 7 days Duration/Length of Stay: 07/17/15  Treatment/Interventions: Cognitive remediation/compensation;Cueing hierarchy;Functional tasks;Patient/family education;Therapeutic Activities;Internal/external aids;Environmental controls;Dysphagia/aspiration precaution  training   Daily Session  Skilled Therapeutic Interventions: Skilled treatment session focused on cognitive and dysphagia goals. SLP facilitated session by providing Min A verbal and question cues for orientation to place and time and total A for orientation to situation. SLP also facilitated session by providing extra time and Mod-Max A multimodal cues to self-monitor and correct errors in regards to biographical information. Patient demonstrated sustained attention to task for ~2 minutes with Mod A verbal cues for redirection. Patient continues to demonstrate intermittent language of confusion. Patient performed oral care via the suction toothbrush with set-up and consumed trials of thin liquids via cup without overt s/s of aspiration. Patient left upright in wheelchair at RN station with quick release belt in place. Continue with current plan of care.      Function:   Eating Eating   Modified Consistency Diet: Yes Eating Assist Level: Supervision or verbal cues   Eating Set Up Assist For: Opening containers       Cognition Comprehension Comprehension assist level: Understands basic 50 - 74% of the time/ requires cueing 25 - 49% of the time  Expression   Expression assist level: Expresses basic 50 - 74% of the time/requires cueing 25 - 49% of the time. Needs to repeat parts of sentences.  Social Interaction Social Interaction assist level: Interacts appropriately 50 - 74% of the time - May be physically or verbally inappropriate.  Problem Solving Problem solving assist level: Solves basic 25 - 49% of the time - needs direction more than half the time to initiate, plan or complete simple activities  Memory Memory assist level: Recognizes or recalls less than 25% of the time/requires cueing greater than 75% of the time   Pain Pain Assessment Pain Assessment: Faces Pain Score: 6  Faces Pain Scale: Hurts little more Pain Type: Acute pain Pain Location: Head Pain Descriptors /  Indicators: Headache Pain Onset: On-going Pain Intervention(s): Medication (See eMAR)  Therapy/Group: Individual Therapy  Buffy Ehler 07/06/2015, 3:27 PM

## 2015-07-06 NOTE — Progress Notes (Signed)
Occupational Therapy Session Note  Patient Details  Name: Darryl [name] MRN: [id number] Date of Birth: Oct 16, 1950  Today's Date: 07/06/2015 OT Individual Time: 3887-1959 OT Individual Time Calculation (min): 60 min   Short Term Goals: Week 1:  OT Short Term Goal 1 (Week 1): Pt will complete all bathing with min assist sit to stand. OT Short Term Goal 1 - Progress (Week 1): Progressing toward goal OT Short Term Goal 2 (Week 1): Pt will complete LB dressing with min assist sit to stand with AE PRN. OT Short Term Goal 2 - Progress (Week 1): Progressing toward goal OT Short Term Goal 3 (Week 1): Pt will transfer to the walk-in shower with min assist using the RW and shower seat.  OT Short Term Goal 3 - Progress (Week 1): Progressing toward goal OT Short Term Goal 4 (Week 1): Pt will complete toilet transfer with min assist to 3:1 OT Short Term Goal 4 - Progress (Week 1): Progressing toward goal OT Short Term Goal 5 (Week 1): Pt will demonstrate anticipatory awareness by waiting for assistance prior to completing any sit to stand transtions or transfers.  OT Short Term Goal 5 - Progress (Week 1): Met   Week 2:  OT Short Term Goal 1 (Week 2): Pt will transfer to 3 in 1 over toilet with min A. OT Short Term Goal 2 (Week 2): Pt will transfer to shower seat with min A. OT Short Term Goal 3 (Week 2): Pt will sit to stand with min A to prepare for LB self care. OT Short Term Goal 4 (Week 2): Pt will manage clothing over hips for toileting tasks.  Skilled Therapeutic Interventions/Progress Updates:  Pain Assessment: Pt with complaints of a headache at the beginning of the session, no rate given (7/10 on face scale). Notified RN and RN stated he recently had pain medication.  Upon entering room, pt found supine in bed. Pt perseverating on headache and being cold. Increased time needed to perform bed mobility and sit EOB, increased cueing and encouragement required. Once seated EOB, pt donned bilateral  socks with increased time and cueing. Pt then performed stand pivot transfer EOB to w/c. Therapist assisted pt to sink for focus on UB/LB bathing and dressing in sit to/from stand position. After ADL, pt sat in w/c for self-feeding, cues required for slowing down and taking small bites/sips. During session, focused on attention, initiation, and following commands. Pt with good problem solving during session, asking for thicker pants because he was cold, asking for a step stool to increase independence with tying shoes, and asking for glasses appropriately. Pt does require increased time for most tasks and requires multimodal cueing. At end of session, left pt with RN present finishing breakfast. Donned quick release belt.   Therapy Documentation Precautions:  Precautions Precautions: Fall Restrictions Weight Bearing Restrictions: No  Vital Signs: Therapy Vitals Temp: 98.4 F (36.9 C) Temp Source: Oral Pulse Rate: 75 Resp: 16 BP: 122/70 mmHg Patient Position (if appropriate): Lying Oxygen Therapy SpO2: 100 % O2 Device: Not Delivered  See Function Navigator for Current Functional Status.  Therapy/Group: Individual Therapy  Chrys Racer , MS, OTR/L, CLT Pager: [phone]  07/06/2015, 9:36 AM

## 2015-07-06 NOTE — Progress Notes (Signed)
MBSS complete. Full report located under chart review in imaging section.  Kalim Kissel Paiewonsky, M.A. CCC-SLP [phone]  

## 2015-07-07 ENCOUNTER — Inpatient Hospital Stay (HOSPITAL_COMMUNITY): Payer: BC Managed Care – PPO | Admitting: Physical Therapy

## 2015-07-07 ENCOUNTER — Inpatient Hospital Stay (HOSPITAL_COMMUNITY): Payer: BC Managed Care – PPO | Admitting: Speech Pathology

## 2015-07-07 LAB — GLUCOSE, CAPILLARY
Glucose-Capillary: 124 mg/dL — ABNORMAL HIGH (ref 65–99)
Glucose-Capillary: 132 mg/dL — ABNORMAL HIGH (ref 65–99)
Glucose-Capillary: 142 mg/dL — ABNORMAL HIGH (ref 65–99)
Glucose-Capillary: 91 mg/dL (ref 65–99)
Glucose-Capillary: 155 mg/dL — ABNORMAL HIGH (ref 65–99)

## 2015-07-07 NOTE — Progress Notes (Signed)
Physical Therapy Session Note  Patient Details  Name: Darryl [name] MRN: [id number] Date of Birth: 10-07-1950  Today's Date: 07/07/2015 PT Individual Time: 1000-1045 PT Individual Time Calculation (min): 45 min   Short Term Goals: Week 2:  PT Short Term Goal 1 (Week 2): Patient will ambulate 50 ft using LRAD with assist of one person.  PT Short Term Goal 2 (Week 2): Patient will transfer bed <> wheelchair with min A.  PT Short Term Goal 3 (Week 2): Patient will negotiate up/down 12 stairs with assist of 1 person.  PT Short Term Goal 4 (Week 2): Patient will maintain standing balance during functional task x 3 min.  Skilled Therapeutic Interventions/Progress Updates:   Patient awake in enclosure bed, with increased time patient sat edge of bed with supervision and engaged in conversation with PT stating that although he did not want to, he was agreeable to ambulation because he "knows it will help me get better." Patient ambulated using RW with mod-max A and total multimodal cues for safety due to pushing RW too far in front of him and with forward flexed posture x 80 ft in controlled environment with +2A for wheelchair follow for safety. Patient able to correct posture with total cues in static standing but unable to maintain upright posture with ambulation. Patient performed Kinetron from wheelchair level at 20 cm/sec x 8 min. Stair training up/down 12 stairs using 2 rails with step-to pattern and min-mod A overall with mod verbal cues for safety and sequencing. Patient propelled wheelchair using BUE x 150 ft with supervision and initial cues for safe hand placement on wheelchair rims. Patient c/o increased R knee pain, provided ice pack and SLP informed to remove ice pack in 20 min. Patient required rest breaks between tasks due to fatigue/pain but overall he demonstrated improved initiation and participation this session. Patient left sitting in wheelchair with quick release belt on at RN station to  await SLP session.   Therapy Documentation Precautions:  Precautions Precautions: Fall Restrictions Weight Bearing Restrictions: No Pain: "Worst" pain in head and R knee; rest, ice, and emotional support provided, patient premedicated   See Function Navigator for Current Functional Status.   Therapy/Group: Individual Therapy  Kerney Elbe 07/07/2015, 12:35 PM

## 2015-07-07 NOTE — Progress Notes (Signed)
[place] PHYSICAL MEDICINE & REHABILITATION     PROGRESS NOTE    Subjective/Complaints: Patient in net bed.    .  ROS limited by cognition and behavior.   Objective: Vital Signs: Blood pressure 145/85, pulse 82, temperature 97.9 F (36.6 C), temperature source Oral, resp. rate 18, height 5\' 11"  (1.803 m), weight 73.8 kg (162 lb 11.2 oz), SpO2 99 %. Dg Swallowing Func-speech Pathology  07/06/2015  Objective Swallowing Evaluation: Type of Study: MBS-Modified Barium Swallow Study Patient Details Name: Demetries Coia MRN: [id number] Date of Birth: September 20, 1950 Today's Date: 07/06/2015 Time: SLP Start Time (ACUTE ONLY): 0800-SLP Stop Time (ACUTE ONLY): 0815 SLP Time Calculation (min) (ACUTE ONLY): 15 min Past Medical History: Past Medical History Diagnosis Date . Hypertension  . Seizures (HCC)  . Diabetes mellitus without complication (HCC)  . Acute deep vein thrombosis (DVT) of left femoral vein (HCC) 07/01/2015 Past Surgical History: Past Surgical History Procedure Laterality Date . Knee surgery   . Cholecystectomy   . Radiology with anesthesia N/A 06/11/2015   Procedure: RADIOLOGY WITH ANESTHESIA;  Surgeon: Lisbeth Renshaw, MD;  Location: [facility] OR;  Service: Radiology;  Laterality: N/A; HPI: Pt is a 65 y.o. male PMH hypertension, seizures, and diabetes, admitted 1/8 with subarachnoid hemorrhage. Pt was intubated 1/8-1/10. Was on a regular diet and tolerating well, then made NPO after MS changes, then started back on full liquid diet. Was then made NPO agian d/t concerns with coughing following thin liquids on 1/20 and 1/21. Pt with persisting confusion. P has been on a Dys 1 diet and nectar thick liquids on CIR, trialing thin liquids and more advanced solids. Repeat MBS ordered to assess for readiness to upgrade diet. Subjective: The patient was seen in radiology for MBSS.  Assessment / Plan / Recommendation CHL IP CLINICAL IMPRESSIONS 07/06/2015 Therapy Diagnosis Moderate oral phase dysphagia;Mild pharyngeal phase  dysphagia Clinical Impression Pt has a moderate oral and mild pharyngeal dysphagia with improvements noted since previous study. While oral phase remains prolonged and discoordinated, pharyngeal strength has progressed, resulting in decreased pharyngeal residue across consistencies. Mild residue remains in the pyriform sinuses with all POs tested, and cued second swallows are effective at reducing this amount. Flash penetration occurred x1 during challenging with multiple large, sequential boluses of thin liquids by straw. Recommend to continue Dys 1 diet with advancement to thin liquids. Given reduction of residue, pt will likely be appropriate for further solid advancement at bedside as oral preparation and sustained attention to task improve. Impact on safety and function Moderate aspiration risk;Mild aspiration risk   CHL IP TREATMENT RECOMMENDATION 07/06/2015 Treatment Recommendations Other (Comment)   Prognosis 07/06/2015 Prognosis for Safe Diet Advancement Good Barriers to Reach Goals Cognitive deficits Barriers/Prognosis Comment -- CHL IP DIET RECOMMENDATION 07/06/2015 SLP Diet Recommendations Dysphagia 1 (Puree) solids;Thin liquid Liquid Administration via Cup;Straw Medication Administration Crushed with puree Compensations Minimize environmental distractions;Slow rate;Small sips/bites;Follow solids with liquid Postural Changes Seated upright at 90 degrees   CHL IP OTHER RECOMMENDATIONS 07/06/2015 Recommended Consults -- Oral Care Recommendations Oral care BID Other Recommendations --   CHL IP FOLLOW UP RECOMMENDATIONS 06/25/2015 Follow up Recommendations Inpatient Rehab   CHL IP FREQUENCY AND DURATION 06/25/2015 Speech Therapy Frequency (ACUTE ONLY) min 2x/week Treatment Duration 2 weeks      CHL IP ORAL PHASE 07/06/2015 Oral Phase Impaired Oral - Pudding Teaspoon -- Oral - Pudding Cup -- Oral - Honey Teaspoon NT Oral - Honey Cup NT Oral - Nectar Teaspoon NT Oral - Nectar Cup NT Oral -  Nectar Straw -- Oral - Thin  Teaspoon Delayed oral transit;Decreased bolus cohesion;Premature spillage Oral - Thin Cup Delayed oral transit;Decreased bolus cohesion;Premature spillage Oral - Thin Straw Delayed oral transit;Decreased bolus cohesion;Premature spillage Oral - Puree Delayed oral transit;Decreased bolus cohesion Oral - Mech Soft Delayed oral transit;Decreased bolus cohesion;Lingual/palatal residue Oral - Regular -- Oral - Multi-Consistency -- Oral - Pill -- Oral Phase - Comment --  CHL IP PHARYNGEAL PHASE 07/06/2015 Pharyngeal Phase WFL;Impaired Pharyngeal- Pudding Teaspoon -- Pharyngeal -- Pharyngeal- Pudding Cup -- Pharyngeal -- Pharyngeal- Honey Teaspoon NT Pharyngeal -- Pharyngeal- Honey Cup NT Pharyngeal -- Pharyngeal- Nectar Teaspoon NT Pharyngeal -- Pharyngeal- Nectar Cup NT Pharyngeal -- Pharyngeal- Nectar Straw -- Pharyngeal -- Pharyngeal- Thin Teaspoon Delayed swallow initiation-vallecula;Reduced anterior laryngeal mobility;Reduced laryngeal elevation;Pharyngeal residue - pyriform Pharyngeal Material does not enter airway Pharyngeal- Thin Cup Delayed swallow initiation-vallecula;Reduced anterior laryngeal mobility;Reduced laryngeal elevation;Pharyngeal residue - pyriform Pharyngeal -- Pharyngeal- Thin Straw Delayed swallow initiation-vallecula;Reduced anterior laryngeal mobility;Reduced laryngeal elevation;Pharyngeal residue - pyriform;Penetration/Aspiration during swallow Pharyngeal Material enters airway, remains ABOVE vocal cords then ejected out Pharyngeal- Puree Delayed swallow initiation-vallecula;Reduced anterior laryngeal mobility;Reduced laryngeal elevation;Pharyngeal residue - pyriform Pharyngeal -- Pharyngeal- Mechanical Soft Delayed swallow initiation-vallecula;Reduced anterior laryngeal mobility;Reduced laryngeal elevation;Pharyngeal residue - pyriform Pharyngeal -- Pharyngeal- Regular -- Pharyngeal -- Pharyngeal- Multi-consistency -- Pharyngeal -- Pharyngeal- Pill -- Pharyngeal -- Pharyngeal Comment --  CHL IP  CERVICAL ESOPHAGEAL PHASE 07/06/2015 Cervical Esophageal Phase WFL Pudding Teaspoon -- Pudding Cup -- Honey Teaspoon -- Honey Cup -- Nectar Teaspoon -- Nectar Cup -- Nectar Straw -- Thin Teaspoon -- Thin Cup -- Thin Straw -- Puree -- Mechanical Soft -- Regular -- Multi-consistency -- Pill -- Cervical Esophageal Comment -- CHL IP GO 06/25/2015 Functional Assessment Tool Used ASHA NOMS and clinical judgment.   Functional Limitations Swallowing Swallow Current Status [phone]) CK Swallow Goal Status (U0454) CK Swallow Discharge Status [phone]) (None) Motor Speech Current Status [phone]) (None) Motor Speech Goal Status [phone]) (None) Motor Speech Goal Status [phone]) (None) Spoken Language Comprehension Current Status [phone]) (None) Spoken Language Comprehension Goal Status (N6295) (None) Spoken Language Comprehension Discharge Status [phone]) (None) Spoken Language Expression Current Status [phone]) (None) Spoken Language Expression Goal Status [phone]) (None) Spoken Language Expression Discharge Status [phone]) (None) Attention Current Status (I3474) (None) Attention Goal Status (Q5956) (None) Attention Discharge Status [phone]) (None) Memory Current Status (E3329) (None) Memory Goal Status (J1884) (None) Memory Discharge Status (Z6606) (None) Voice Current Status (T0160) (None) Voice Goal Status (F0932) (None) Voice Discharge Status (T5573) (None) Other Speech-Language Pathology Functional Limitation [phone]) (None) Other Speech-Language Pathology Functional Limitation Goal Status (K2706) (None) Other Speech-Language Pathology Functional Limitation Discharge Status [phone]) (None) Maxcine Ham, M.A. CCC-SLP [phone] Maxcine Ham 07/06/2015, 12:06 PM               Recent Labs  07/05/15 0639  WBC 7.0  HGB 13.2  HCT 38.7*  PLT 336    Recent Labs  07/05/15 0639  NA 140  K 4.6  CL 104  GLUCOSE 163*  BUN 9  CREATININE 1.12  CALCIUM 9.7   CBG (last 3)   Recent Labs  07/06/15 2137 07/07/15 0705  07/07/15 0926  GLUCAP 110* 124* 132*    Wt Readings from Last 3 Encounters:  07/04/15 73.8 kg (162 lb 11.2 oz)  06/13/15 89.6 kg (197 lb 8.5 oz)  06/08/15 91.627 kg (202 lb)    Physical Exam:  Constitutional: He appears well-developed and well-nourished.  HENT:  Head: Normocephalic and atraumatic.  Small incision on scalp with  a couple of sutures and staples--+/- soft. Tender to touch.  Eyes: Conjunctivae are normal. Pupils are equal, round, and reactive to light.  Neck: Neck supple.  Limited ROM laterally and flexion limited due to pain.  Cardiovascular: Normal rate and regular rhythm.  Respiratory: Effort normal and breath sounds normal. No stridor. No respiratory distress. He has no wheezes.  GI: Soft. Bowel sounds are normal. He exhibits no distension. There is no tenderness.  Musculoskeletal: He exhibits no edema.  No tenderness in extremeties  Neurological: He is alert.  A&Ox2, name and "[place]", Not oriented to situation Remains distracted and lacks insight and awareness of deficits.  He was able to follow one and occasional two step motor commands.   Motor: B/L UE 4/5 B/L hip flexion 4-/5, ankle dorsi/plantar flexion 4+/5 ---moves all 4's without obvious preference this am.  Skin: Skin is warm and dry.  Psychiatric: Cognition and memory are impaired. He expresses inappropriate judgment. He is inattentive.     Assessment/Plan:  1. Cognitive, swallowing, and mobility deficits secondary to [facility] which require 3+ hours per day of interdisciplinary therapy in a comprehensive inpatient rehab setting. Physiatrist is providing close team supervision and 24 hour management of active medical problems listed below. Physiatrist and rehab team continue to assess barriers to discharge/monitor patient progress toward functional and medical goals.  Function:  Bathing Bathing position   Position: Wheelchair/chair at sink  Bathing parts Body parts bathed by patient: Right arm,  Left arm, Chest, Abdomen, Front perineal area, Right upper leg, Left upper leg, Buttocks, Right lower leg, Left lower leg Body parts bathed by helper: Back  Bathing assist Assist Level: Touching or steadying assistance(Pt > 75%) (during sit to/from stands)      Upper Body Dressing/Undressing Upper body dressing   What is the patient wearing?: Pull over shirt/dress     Pull over shirt/dress - Perfomed by patient: Thread/unthread right sleeve, Thread/unthread left sleeve, Put head through opening Pull over shirt/dress - Perfomed by helper: Pull shirt over trunk        Upper body assist Assist Level: Set up, Supervision or verbal cues   Set up : To obtain clothing/put away  Lower Body Dressing/Undressing Lower body dressing   What is the patient wearing?: Pants, Ted Hose, Shoes Underwear - Performed by patient: Thread/unthread right underwear leg, Thread/unthread left underwear leg Underwear - Performed by helper: Thread/unthread right underwear leg, Thread/unthread left underwear leg, Pull underwear up/down Pants- Performed by patient: Thread/unthread right pants leg, Thread/unthread left pants leg, Pull pants up/down Pants- Performed by helper: Pull pants up/down Non-skid slipper socks- Performed by patient: Don/doff right sock, Don/doff left sock Non-skid slipper socks- Performed by helper: Don/doff right sock, Don/doff left sock     Shoes - Performed by patient: Don/doff right shoe, Don/doff left shoe, Fasten right, Fasten left Shoes - Performed by helper: Fasten right, Fasten left       TED Hose - Performed by helper: Don/doff right TED hose, Don/doff left TED hose  Lower body assist Assist for lower body dressing: Supervision or verbal cues      Toileting Toileting Toileting activity did not occur: N/A Toileting steps completed by patient: Performs perineal hygiene Toileting steps completed by helper: Adjust clothing prior to toileting, Adjust clothing after  toileting Toileting Assistive Devices: Grab bar or rail  Toileting assist Assist level: Touching or steadying assistance (Pt.75%)   Transfers Chair/bed transfer   Chair/bed transfer method: Stand pivot Chair/bed transfer assist level: Moderate assist (Pt 50 -  74%/lift or lower) Chair/bed transfer assistive device: Armrests     Locomotion Ambulation Ambulation activity did not occur: Refused   Max distance: 5 Assist level: 2 helpers   Wheelchair   Type: Manual Max wheelchair distance: 150 Assist Level: Supervision or verbal cues  Cognition Comprehension Comprehension assist level: Understands basic 50 - 74% of the time/ requires cueing 25 - 49% of the time  Expression Expression assist level: Expresses basic 50 - 74% of the time/requires cueing 25 - 49% of the time. Needs to repeat parts of sentences.  Social Interaction Social Interaction assist level: Interacts appropriately 50 - 74% of the time - May be physically or verbally inappropriate.  Problem Solving Problem solving assist level: Solves basic 25 - 49% of the time - needs direction more than half the time to initiate, plan or complete simple activities  Memory Memory assist level: Recognizes or recalls less than 25% of the time/requires cueing greater than 75% of the time   Medical Problem List and Plan: 1. 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 Cognitive and behavioral issues remain in forefront   2. LLE DVT: eliquis. No bleeding sequelae 3. Pain Management:  oxycodone prn for headaches and back/leg pain.   -see #15 below 4. Mood: LCSW to follow for evaluation and support.  5. Neuropsych: This patient is not  capable of making decisions on his own behalf.  -continue to provide appropriate environment for sleep and communication  -environmental mod  -  sleep chart  -increased ritalin to  bid 6.  Skin/Wound Care: routine pressure relief measures.  7. Fluids/Electrolytes/Nutrition: intake inconsistent. Added nutritional supplements. Monitor for now.  .  -encourage PO 8. HTN: Monitor BP tid   -resumed low dose Cozaar.   -control had fair 9. DM type 2: Monitor BS ac/hs basis. Po intake has been variable due to lethargy and cognitive issues. Wife encouraged to bring pureed foods from home.   -continue to hold metformin and use SSI for now. sugars remain under control 10. Seizures disorder: Was on lamictal bid prior to admission. On keppra bid which has been changed to PO 11. ABLA: continue iron supplement..  12. Mild ileus:   two Senna S bid. Use enema prn.    13. Hypokalemia: Supplement to avoid recurrence of ileus.  14. Leucocytosis: ua neg, cx pending  -he is afebrile  15. Chronic right knee pain/lower ext pain: improving   -voltaren gel, prn ICE  16. PVCs: Negative cardiac work up 05/2015 17. Safety:   enclosure bed for fall prevention/safety is still required   -order renewed again today--would continue through the weekend   LOS (Days) 10 A FACE TO FACE EVALUATION WAS PERFORMED  Erick Colace 07/07/2015 10:56 AM

## 2015-07-07 NOTE — Progress Notes (Signed)
Speech Language Pathology Daily Session Note  Patient Details  Name: Darryl [name] MRN: [id number] Date of Birth: Jul 04, 1950  Today's Date: 07/07/2015 SLP Individual Time: 1045-1130 SLP Individual Time Calculation (min): 45 min  Short Term Goals: Week 2: SLP Short Term Goal 1 (Week 2): Patient will orient to time, place and situation with Mod A question and verbal cues.  SLP Short Term Goal 2 (Week 2): Patient will demonstrate sustained attention to functional tasks for 5 minutes with Mod A verbal cues for redirection. SLP Short Term Goal 3 (Week 2): Patient will identify 1 physical and 1 cognitive deficit with Min A verbal and question cues.  SLP Short Term Goal 4 (Week 2): Patient will utilize call bell to request assistance with Max A multimodal cues in 25% of opportunities.  SLP Short Term Goal 5 (Week 2): Patient will consume trials of Dys. 2 textures and demonstrate efficent mastication with minimal oral residue with Min A verbal cues to self-monitor and correct oral residue over 2 consecutive sessions prior to upgrade.  SLP Short Term Goal 6 (Week 2): Patient will consume current diet with minimal overt s/s of aspiration with Min A verbal cues for use of swallowing compensatory strategies.   Skilled Therapeutic Interventions: Skilled treatment session focused on addressing dysphagia and cognition goals. Patient was observed to consume thin liquids via straw at a rapid pace with large portions, which resulted in coughing.  SLP facilitated session with Min verbal cues for portion control and pacing which prevented further overt s/s of aspiration.  Recommend to continue full supervision with all PO.  SLP also facilitated session with a basic calendar organization task, which patient required Max assist demonstrative cues to organize and initiate.  SLP was then able to fade cues to Min assist verbal for accurate completion of task.  Additionally, patient required frequent redirections due to pain  and internal distractions; patient able to sustain attention for 1-2 minutes with Min-Mod cues.  Patient oriented to place, month and that he has had another head injury; with repetition cues patient oriented to year at end of session.  Continue with current plan of care.    Function:  Cognition Comprehension Comprehension assist level: Understands basic 50 - 74% of the time/ requires cueing 25 - 49% of the time  Expression   Expression assist level: Expresses basic 50 - 74% of the time/requires cueing 25 - 49% of the time. Needs to repeat parts of sentences.  Social Interaction Social Interaction assist level: Interacts appropriately 25 - 49% of time - Needs frequent redirection.  Problem Solving Problem solving assist level: Solves basic 25 - 49% of the time - needs direction more than half the time to initiate, plan or complete simple activities  Memory Memory assist level: Recognizes or recalls 25 - 49% of the time/requires cueing 50 - 75% of the time    Pain Pain Assessment Pain Assessment: 0-10 Pain Score: 6  Pain Type: Acute pain Pain Location: Head Pain Descriptors / Indicators: Aching Pain Onset: Gradual Pain Intervention(s): RN notified, Medication (See eMAR)  Therapy/Group: Individual Therapy  Charlane Ferretti., CCC-SLP [phone]  Darryl [name] 07/07/2015, 12:39 PM

## 2015-07-08 LAB — GLUCOSE, CAPILLARY
Glucose-Capillary: 115 mg/dL — ABNORMAL HIGH (ref 65–99)
Glucose-Capillary: 163 mg/dL — ABNORMAL HIGH (ref 65–99)
Glucose-Capillary: 118 mg/dL — ABNORMAL HIGH (ref 65–99)
Glucose-Capillary: 120 mg/dL — ABNORMAL HIGH (ref 65–99)

## 2015-07-08 NOTE — Progress Notes (Signed)
[facility] PHYSICAL MEDICINE & REHABILITATION     PROGRESS NOTE    Subjective/Complaints: Still with headaches, patient asking why he had an aneurysm after he was cued that he actually had one  .  ROS limited by cognition and behavior.   Objective: Vital Signs: Blood pressure 129/80, pulse 82, temperature 98 F (36.7 C), temperature source Tympanic, resp. rate 18, height 5\' 11"  (1.803 m), weight 73.8 kg (162 lb 11.2 oz), SpO2 100 %. Dg Swallowing Func-speech Pathology  07/06/2015  Objective Swallowing Evaluation: Type of Study: MBS-Modified Barium Swallow Study Patient Details Name: Darryl [name] MRN: [id number] Date of Birth: Apr 25, 1951 Today's Date: 07/06/2015 Time: SLP Start Time (ACUTE ONLY): 0800-SLP Stop Time (ACUTE ONLY): 0815 SLP Time Calculation (min) (ACUTE ONLY): 15 min Past Medical History: Past Medical History Diagnosis Date . Hypertension  . Seizures (HCC)  . Diabetes mellitus without complication (HCC)  . Acute deep vein thrombosis (DVT) of left femoral vein (HCC) 07/01/2015 Past Surgical History: Past Surgical History Procedure Laterality Date . Knee surgery   . Cholecystectomy   . Radiology with anesthesia N/A 06/11/2015   Procedure: RADIOLOGY WITH ANESTHESIA;  Surgeon: Lisbeth Renshaw, MD;  Location: [facility] OR;  Service: Radiology;  Laterality: N/A; HPI: Pt is a 65 y.o. male PMH hypertension, seizures, and diabetes, admitted 1/8 with subarachnoid hemorrhage. Pt was intubated 1/8-1/10. Was on a regular diet and tolerating well, then made NPO after MS changes, then started back on full liquid diet. Was then made NPO agian d/t concerns with coughing following thin liquids on 1/20 and 1/21. Pt with persisting confusion. P has been on a Dys 1 diet and nectar thick liquids on CIR, trialing thin liquids and more advanced solids. Repeat MBS ordered to assess for readiness to upgrade diet. Subjective: The patient was seen in radiology for MBSS.  Assessment / Plan / Recommendation CHL IP CLINICAL  IMPRESSIONS 07/06/2015 Therapy Diagnosis Moderate oral phase dysphagia;Mild pharyngeal phase dysphagia Clinical Impression Pt has a moderate oral and mild pharyngeal dysphagia with improvements noted since previous study. While oral phase remains prolonged and discoordinated, pharyngeal strength has progressed, resulting in decreased pharyngeal residue across consistencies. Mild residue remains in the pyriform sinuses with all POs tested, and cued second swallows are effective at reducing this amount. Flash penetration occurred x1 during challenging with multiple large, sequential boluses of thin liquids by straw. Recommend to continue Dys 1 diet with advancement to thin liquids. Given reduction of residue, pt will likely be appropriate for further solid advancement at bedside as oral preparation and sustained attention to task improve. Impact on safety and function Moderate aspiration risk;Mild aspiration risk   CHL IP TREATMENT RECOMMENDATION 07/06/2015 Treatment Recommendations Other (Comment)   Prognosis 07/06/2015 Prognosis for Safe Diet Advancement Good Barriers to Reach Goals Cognitive deficits Barriers/Prognosis Comment -- CHL IP DIET RECOMMENDATION 07/06/2015 SLP Diet Recommendations Dysphagia 1 (Puree) solids;Thin liquid Liquid Administration via Cup;Straw Medication Administration Crushed with puree Compensations Minimize environmental distractions;Slow rate;Small sips/bites;Follow solids with liquid Postural Changes Seated upright at 90 degrees   CHL IP OTHER RECOMMENDATIONS 07/06/2015 Recommended Consults -- Oral Care Recommendations Oral care BID Other Recommendations --   CHL IP FOLLOW UP RECOMMENDATIONS 06/25/2015 Follow up Recommendations Inpatient Rehab   CHL IP FREQUENCY AND DURATION 06/25/2015 Speech Therapy Frequency (ACUTE ONLY) min 2x/week Treatment Duration 2 weeks      CHL IP ORAL PHASE 07/06/2015 Oral Phase Impaired Oral - Pudding Teaspoon -- Oral - Pudding Cup -- Oral - Honey Teaspoon NT Oral - Honey  Cup NT Oral - Nectar Teaspoon NT Oral - Nectar Cup NT Oral - Nectar Straw -- Oral - Thin Teaspoon Delayed oral transit;Decreased bolus cohesion;Premature spillage Oral - Thin Cup Delayed oral transit;Decreased bolus cohesion;Premature spillage Oral - Thin Straw Delayed oral transit;Decreased bolus cohesion;Premature spillage Oral - Puree Delayed oral transit;Decreased bolus cohesion Oral - Mech Soft Delayed oral transit;Decreased bolus cohesion;Lingual/palatal residue Oral - Regular -- Oral - Multi-Consistency -- Oral - Pill -- Oral Phase - Comment --  CHL IP PHARYNGEAL PHASE 07/06/2015 Pharyngeal Phase WFL;Impaired Pharyngeal- Pudding Teaspoon -- Pharyngeal -- Pharyngeal- Pudding Cup -- Pharyngeal -- Pharyngeal- Honey Teaspoon NT Pharyngeal -- Pharyngeal- Honey Cup NT Pharyngeal -- Pharyngeal- Nectar Teaspoon NT Pharyngeal -- Pharyngeal- Nectar Cup NT Pharyngeal -- Pharyngeal- Nectar Straw -- Pharyngeal -- Pharyngeal- Thin Teaspoon Delayed swallow initiation-vallecula;Reduced anterior laryngeal mobility;Reduced laryngeal elevation;Pharyngeal residue - pyriform Pharyngeal Material does not enter airway Pharyngeal- Thin Cup Delayed swallow initiation-vallecula;Reduced anterior laryngeal mobility;Reduced laryngeal elevation;Pharyngeal residue - pyriform Pharyngeal -- Pharyngeal- Thin Straw Delayed swallow initiation-vallecula;Reduced anterior laryngeal mobility;Reduced laryngeal elevation;Pharyngeal residue - pyriform;Penetration/Aspiration during swallow Pharyngeal Material enters airway, remains ABOVE vocal cords then ejected out Pharyngeal- Puree Delayed swallow initiation-vallecula;Reduced anterior laryngeal mobility;Reduced laryngeal elevation;Pharyngeal residue - pyriform Pharyngeal -- Pharyngeal- Mechanical Soft Delayed swallow initiation-vallecula;Reduced anterior laryngeal mobility;Reduced laryngeal elevation;Pharyngeal residue - pyriform Pharyngeal -- Pharyngeal- Regular -- Pharyngeal -- Pharyngeal-  Multi-consistency -- Pharyngeal -- Pharyngeal- Pill -- Pharyngeal -- Pharyngeal Comment --  CHL IP CERVICAL ESOPHAGEAL PHASE 07/06/2015 Cervical Esophageal Phase WFL Pudding Teaspoon -- Pudding Cup -- Honey Teaspoon -- Honey Cup -- Nectar Teaspoon -- Nectar Cup -- Nectar Straw -- Thin Teaspoon -- Thin Cup -- Thin Straw -- Puree -- Mechanical Soft -- Regular -- Multi-consistency -- Pill -- Cervical Esophageal Comment -- CHL IP GO 06/25/2015 Functional Assessment Tool Used ASHA NOMS and clinical judgment.   Functional Limitations Swallowing Swallow Current Status [phone]) CK Swallow Goal Status (U0454) CK Swallow Discharge Status [phone]) (None) Motor Speech Current Status [phone]) (None) Motor Speech Goal Status [phone]) (None) Motor Speech Goal Status [phone]) (None) Spoken Language Comprehension Current Status [phone]) (None) Spoken Language Comprehension Goal Status (N6295) (None) Spoken Language Comprehension Discharge Status [phone]) (None) Spoken Language Expression Current Status [phone]) (None) Spoken Language Expression Goal Status [phone]) (None) Spoken Language Expression Discharge Status [phone]) (None) Attention Current Status (I3474) (None) Attention Goal Status (Q5956) (None) Attention Discharge Status [phone]) (None) Memory Current Status (E3329) (None) Memory Goal Status (J1884) (None) Memory Discharge Status (Z6606) (None) Voice Current Status (T0160) (None) Voice Goal Status (F0932) (None) Voice Discharge Status (T5573) (None) Other Speech-Language Pathology Functional Limitation [phone]) (None) Other Speech-Language Pathology Functional Limitation Goal Status (K2706) (None) Other Speech-Language Pathology Functional Limitation Discharge Status [phone]) (None) Darryl [name], M.A. CCC-SLP [phone] Darryl [name] 07/06/2015, 12:06 PM              No results for input(s): WBC, HGB, HCT, PLT in the last 72 hours. No results for input(s): NA, K, CL, GLUCOSE, BUN, CREATININE, CALCIUM in the last 72  hours.  Invalid input(s): CO CBG (last 3)   Recent Labs  07/07/15 1641 07/07/15 2153 07/08/15 0616  GLUCAP 91 155* 115*    Wt Readings from Last 3 Encounters:  07/04/15 73.8 kg (162 lb 11.2 oz)  06/13/15 89.6 kg (197 lb 8.5 oz)  06/08/15 91.627 kg (202 lb)    Physical Exam:  Constitutional: He appears well-developed and well-nourished.  HENT:  Head: Normocephalic and atraumatic.  Small incision on scalp with a couple of  sutures and staples--+/- soft.  Eyes: Conjunctivae are normal. Pupils are equal, round, and reactive to light.  Neck: Neck supple.  Limited ROM laterally and flexion limited due to pain.  Cardiovascular: Normal rate and regular rhythm.  Respiratory: Effort normal and breath sounds normal. No stridor. No respiratory distress. He has no wheezes.  GI: Soft. Bowel sounds are normal. He exhibits no distension. There is no tenderness.  Musculoskeletal: He exhibits no edema.  No tenderness in extremeties  Neurological: He is alert.  A&Ox2, name  Not oriented to situation Remains distracted and lacks insight and awareness of deficits.  He was able to follow one and occasional two step motor commands.   Motor: B/L UE 4/5 B/L hip flexion 4-/5, ankle dorsi/plantar flexion 4+/5 ---moves all 4's without obvious preference this am.  Skin: Skin is warm and dry.  Psychiatric: Cognition and memory are impaired. He expresses inappropriate judgment. He is inattentive.     Assessment/Plan:  1. Cognitive, swallowing, and mobility deficits secondary to [facility] which require 3+ hours per day of interdisciplinary therapy in a comprehensive inpatient rehab setting. Physiatrist is providing close team supervision and 24 hour management of active medical problems listed below. Physiatrist and rehab team continue to assess barriers to discharge/monitor patient progress toward functional and medical goals.  Function:  Bathing Bathing position   Position: Wheelchair/chair at  sink  Bathing parts Body parts bathed by patient: Right arm, Left arm, Chest, Abdomen, Front perineal area, Right upper leg, Left upper leg, Buttocks, Right lower leg, Left lower leg Body parts bathed by helper: Back  Bathing assist Assist Level: Touching or steadying assistance(Pt > 75%) (during sit to/from stands)      Upper Body Dressing/Undressing Upper body dressing   What is the patient wearing?: Pull over shirt/dress     Pull over shirt/dress - Perfomed by patient: Thread/unthread right sleeve, Thread/unthread left sleeve, Put head through opening Pull over shirt/dress - Perfomed by helper: Pull shirt over trunk        Upper body assist Assist Level: Set up, Supervision or verbal cues   Set up : To obtain clothing/put away  Lower Body Dressing/Undressing Lower body dressing   What is the patient wearing?: Pants, Ted Hose, Shoes Underwear - Performed by patient: Thread/unthread right underwear leg, Thread/unthread left underwear leg Underwear - Performed by helper: Thread/unthread right underwear leg, Thread/unthread left underwear leg, Pull underwear up/down Pants- Performed by patient: Thread/unthread right pants leg, Thread/unthread left pants leg, Pull pants up/down Pants- Performed by helper: Pull pants up/down Non-skid slipper socks- Performed by patient: Don/doff right sock, Don/doff left sock Non-skid slipper socks- Performed by helper: Don/doff right sock, Don/doff left sock     Shoes - Performed by patient: Don/doff right shoe, Don/doff left shoe, Fasten right, Fasten left Shoes - Performed by helper: Fasten right, Fasten left       TED Hose - Performed by helper: Don/doff right TED hose, Don/doff left TED hose  Lower body assist Assist for lower body dressing: Supervision or verbal cues      Toileting Toileting Toileting activity did not occur: N/A Toileting steps completed by patient: Performs perineal hygiene Toileting steps completed by helper: Adjust  clothing prior to toileting, Adjust clothing after toileting Toileting Assistive Devices: Grab bar or rail  Toileting assist Assist level: Touching or steadying assistance (Pt.75%)   Transfers Chair/bed transfer   Chair/bed transfer method: Ambulatory Chair/bed transfer assist level: Touching or steadying assistance (Pt > 75%) Chair/bed transfer assistive device: Armrests  Locomotion Ambulation Ambulation activity did not occur: Refused   Max distance: 80 Assist level: 2 helpers   Wheelchair   Type: Manual Max wheelchair distance: 150 Assist Level: Supervision or verbal cues  Cognition Comprehension Comprehension assist level: Understands basic 50 - 74% of the time/ requires cueing 25 - 49% of the time  Expression Expression assist level: Expresses basic 50 - 74% of the time/requires cueing 25 - 49% of the time. Needs to repeat parts of sentences.  Social Interaction Social Interaction assist level: Interacts appropriately 25 - 49% of time - Needs frequent redirection.  Problem Solving Problem solving assist level: Solves basic 25 - 49% of the time - needs direction more than half the time to initiate, plan or complete simple activities  Memory Memory assist level: Recognizes or recalls less than 25% of the time/requires cueing greater than 75% of the time   Medical Problem List and Plan: 1. 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 Cognitive and behavioral issues remain in forefront   2. LLE DVT: eliquis. No bleeding sequelae 3. Pain Management:  oxycodone prn for headaches and back/leg pain.   -see #15 below 4. Mood: LCSW to follow for evaluation and support.  5. Neuropsych: This patient is not  capable of making decisions on his own behalf.  -continue to provide appropriate environment for sleep and communication  -environmental mod  -  sleep chart  -increased  ritalin to 10mg  bid 6. Skin/Wound Care: routine pressure relief measures.  7. Fluids/Electrolytes/Nutrition: intake inconsistent. Added nutritional supplements. Monitor for now.  .  -encourage PO 8. HTN: Monitor BP tid   -resumed low dose Cozaar.   -control had fair 9. DM type 2: Monitor BS ac/hs basis. Po intake has been variable due to lethargy and cognitive issues. Wife encouraged to bring pureed foods from home.   -continue to hold metformin and use SSI for now. sugars remain under control 10. Seizures disorder: Was on lamictal bid prior to admission. On keppra bid which has been changed to PO 11. ABLA: continue iron supplement..  12. Mild ileus:   two Senna S bid. Use enema prn.    13. Hypokalemia: Supplement to avoid recurrence of ileus.  14. Leucocytosis: ua neg, cx pending  -he is afebrile  15. Chronic right knee pain/lower ext pain: improving   -voltaren gel, prn ICE  16. PVCs: Negative cardiac work up 05/2015 17. Safety:   enclosure bed for fall prevention/safety is still required   -order renewed again today--would continue through the weekend   LOS (Days) 11 A FACE TO FACE EVALUATION WAS PERFORMED  Erick Colace 07/08/2015 10:33 AM

## 2015-07-09 ENCOUNTER — Inpatient Hospital Stay (HOSPITAL_COMMUNITY): Payer: BC Managed Care – PPO | Admitting: Speech Pathology

## 2015-07-09 ENCOUNTER — Inpatient Hospital Stay (HOSPITAL_COMMUNITY): Payer: BC Managed Care – PPO

## 2015-07-09 ENCOUNTER — Inpatient Hospital Stay (HOSPITAL_COMMUNITY): Payer: BC Managed Care – PPO | Admitting: *Deleted

## 2015-07-09 LAB — GLUCOSE, CAPILLARY
Glucose-Capillary: 151 mg/dL — ABNORMAL HIGH (ref 65–99)
Glucose-Capillary: 108 mg/dL — ABNORMAL HIGH (ref 65–99)
Glucose-Capillary: 109 mg/dL — ABNORMAL HIGH (ref 65–99)
Glucose-Capillary: 132 mg/dL — ABNORMAL HIGH (ref 65–99)

## 2015-07-09 NOTE — Progress Notes (Signed)
[facility] PHYSICAL MEDICINE & REHABILITATION     PROGRESS NOTE    Subjective/Complaints: Remembered [employer] played last night---nothing else recalled about SB. "trying to sleep a little more" when i came in room.  .  ROS limited by cognition and behavior.   Objective: Vital Signs: Blood pressure 142/80, pulse 93, temperature 97.8 F (36.6 C), temperature source Oral, resp. rate 18, height  (1.803 m), weight 73.8 kg (162 lb 11.2 oz), SpO2 99 %. No results found. No results for input(s): WBC, HGB, HCT, PLT in the last 72 hours. No results for input(s): NA, K, CL, GLUCOSE, BUN, CREATININE, CALCIUM in the last 72 hours.  Invalid input(s): CO CBG (last 3)   Recent Labs  07/08/15 1627 07/08/15 2137 07/09/15 0719  GLUCAP 118* 120* 151*    Wt Readings from Last 3 Encounters:  07/04/15 73.8 kg (162 lb 11.2 oz)  06/13/15 89.6 kg (197 lb 8.5 oz)  06/08/15 91.627 kg (202 lb)    Physical Exam:  Constitutional: He appears well-developed and well-nourished.  HENT:  Head: Normocephalic and atraumatic.  Small incision on scalp with a couple of sutures and staples--+/- soft.  Eyes: Conjunctivae are normal. Pupils are equal, round, and reactive to light.  Neck: Neck supple.  Limited ROM laterally and flexion limited due to pain.  Cardiovascular: Normal rate and regular rhythm.  Respiratory: Effort normal and breath sounds normal. No stridor. No respiratory distress. He has no wheezes.  GI: Soft. Bowel sounds are normal. He exhibits no distension. There is no tenderness.  Musculoskeletal: He exhibits no edema.  No tenderness in extremeties  Neurological: He is alert.  A&Ox2, name  Not oriented to situation Distracted/ lacks insight and awareness of deficits.  He was able to follow one and occasional two step motor commands.   Motor: B/L UE 4/5 B/L hip flexion 4-/5, ankle dorsi/plantar flexion 4+/5  Skin: Skin is warm and dry.  Psychiatric: Cognition and memory are  impaired. He expresses inappropriate judgment. He is inattentive.     Assessment/Plan:  1. Cognitive, swallowing, and mobility deficits secondary to [facility] which require 3+ hours per day of interdisciplinary therapy in a comprehensive inpatient rehab setting. Physiatrist is providing close team supervision and 24 hour management of active medical problems listed below. Physiatrist and rehab team continue to assess barriers to discharge/monitor patient progress toward functional and medical goals.  Function:  Bathing Bathing position   Position: Wheelchair/chair at sink  Bathing parts Body parts bathed by patient: Right arm, Left arm, Chest, Abdomen, Front perineal area, Right upper leg, Left upper leg, Buttocks, Right lower leg, Left lower leg Body parts bathed by helper: Back  Bathing assist Assist Level: Touching or steadying assistance(Pt > 75%) (during sit to/from stands)      Upper Body Dressing/Undressing Upper body dressing   What is the patient wearing?: Pull over shirt/dress     Pull over shirt/dress - Perfomed by patient: Thread/unthread right sleeve, Thread/unthread left sleeve, Put head through opening Pull over shirt/dress - Perfomed by helper: Pull shirt over trunk        Upper body assist Assist Level: Set up, Supervision or verbal cues   Set up : To obtain clothing/put away  Lower Body Dressing/Undressing Lower body dressing   What is the patient wearing?: Pants, Ted Hose, Shoes Underwear - Performed by patient: Thread/unthread right underwear leg, Thread/unthread left underwear leg Underwear - Performed by helper: Thread/unthread right underwear leg, Thread/unthread left underwear leg, Pull underwear up/down Pants- Performed  by patient: Thread/unthread right pants leg, Thread/unthread left pants leg, Pull pants up/down Pants- Performed by helper: Pull pants up/down Non-skid slipper socks- Performed by patient: Don/doff right sock, Don/doff left sock Non-skid  slipper socks- Performed by helper: Don/doff right sock, Don/doff left sock     Shoes - Performed by patient: Don/doff right shoe, Don/doff left shoe, Fasten right, Fasten left Shoes - Performed by helper: Fasten right, Fasten left       TED Hose - Performed by helper: Don/doff right TED hose, Don/doff left TED hose  Lower body assist Assist for lower body dressing: Supervision or verbal cues      Toileting Toileting Toileting activity did not occur: N/A Toileting steps completed by patient: Performs perineal hygiene Toileting steps completed by helper: Adjust clothing prior to toileting, Adjust clothing after toileting Toileting Assistive Devices: Grab bar or rail  Toileting assist Assist level: Touching or steadying assistance (Pt.75%)   Transfers Chair/bed transfer   Chair/bed transfer method: Stand pivot Chair/bed transfer assist level: Moderate assist (Pt 50 - 74%/lift or lower) Chair/bed transfer assistive device: Armrests     Locomotion Ambulation Ambulation activity did not occur: Refused   Max distance: 80 Assist level: 2 helpers   Wheelchair   Type: Manual Max wheelchair distance: 150 Assist Level: Supervision or verbal cues  Cognition Comprehension Comprehension assist level: Understands basic 50 - 74% of the time/ requires cueing 25 - 49% of the time  Expression Expression assist level: Expresses basic 50 - 74% of the time/requires cueing 25 - 49% of the time. Needs to repeat parts of sentences.  Social Interaction Social Interaction assist level: Interacts appropriately 25 - 49% of time - Needs frequent redirection.  Problem Solving Problem solving assist level: Solves basic 25 - 49% of the time - needs direction more than half the time to initiate, plan or complete simple activities  Memory Memory assist level: Recognizes or recalls less than 25% of the time/requires cueing greater than 75% of the time   Medical Problem List and Plan: 1. 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 Cognitive and behavioral issues remain his biggest barriers at this point 2. LLE DVT: eliquis. No bleeding sequelae 3. Pain Management:  oxycodone prn for headaches and back/leg pain.   -see #15 below 4. Mood: LCSW to follow for evaluation and support.  5. Neuropsych: This patient is not  capable of making decisions on his own behalf.  -continue to provide appropriate environment for sleep and communication  -environmental mod  -  sleep chart  -increased ritalin to  bid 6. Skin/Wound Care: routine pressure relief measures.  7. Fluids/Electrolytes/Nutrition: intake inconsistent. Added nutritional supplements. Monitor for now.  .  -encourage PO 8. HTN: Monitor BP tid   -resumed low dose Cozaar.   -control had fair 9. DM type 2: Monitor BS ac/hs basis. Po intake has been variable due to lethargy and cognitive issues. Wife encouraged to bring pureed foods from home.   -continue to hold metformin and use SSI for now. sugars remain under control 10. Seizures disorder: Was on lamictal bid prior to admission. On keppra PO bid   11. ABLA: continue iron supplement..  12. Mild ileus:   two Senna S bid. Use enema prn.    13. Hypokalemia: Supplement to avoid recurrence of ileus.  14. Leucocytosis: ua neg, cx pending  -he is afebrile  15. Chronic right knee pain/lower ext pain:  improving   -voltaren gel, prn ICE  16. PVCs: Negative cardiac work up 05/2015 17. Safety:   enclosure bed for fall prevention/safety is still required   -order renewed again today--look at liberating from enclosure bed this week. D/W team   LOS (Days) 12 A FACE TO FACE EVALUATION WAS PERFORMED  SWARTZ,ZACHARY T 07/09/2015 9:00 AM

## 2015-07-09 NOTE — Progress Notes (Signed)
Occupational Therapy Note  Patient Details  Name: Darryl [name] MRN: [id number] Date of Birth: 06-Feb-1951  Today's Date: 07/09/2015 OT Individual Time: 1030-1100 OT Individual Time Calculation (min): 30 min   Pt c/o 8/10 headache; pt received meds approx 60 mins prior to therapy Individual Therapy  Pt resting in enclosure bed upon arrival.  Pt agreeable to therapy session, including Recreational Therapist.  Pt not oriented to place, city, or year.  Pt oriented to month.  Pt transitioned to [facility] therapy gym and participated in table task.  Pt provided with seven scrabble tiles and challenged with being able to form as many words possible with tiles.  Pt required mod verbal cues throughout session.  Pt maintained sustained attention with min verbal cues.  Focus on cognitive remediation, problem solving, attention to task, and orientation.     [name] [facility] 07/09/2015, 12:12 PM

## 2015-07-09 NOTE — Progress Notes (Signed)
Physical Therapy Session Note  Patient Details  Name: Darryl [name] MRN: [id number] Date of Birth: 1951-01-23  Today's Date: 07/09/2015 PT Individual Time: 1302-1402 PT Individual Time Calculation (min): 60 min   Short Term Goals: Week 2:  PT Short Term Goal 1 (Week 2): Patient will ambulate 50 ft using LRAD with assist of one person.  PT Short Term Goal 2 (Week 2): Patient will transfer bed <> wheelchair with min A.  PT Short Term Goal 3 (Week 2): Patient will negotiate up/down 12 stairs with assist of 1 person.  PT Short Term Goal 4 (Week 2): Patient will maintain standing balance during functional task x 3 min.  Skilled Therapeutic Interventions/Progress Updates:  Tx focused on functional mobility training, gait with RW, and NMR via cognitive remediation, Darryl facilitation, and multi-modal cues.Pt resting in enclosure bed upon arrival, neeing mod cues to agree to OOB activity. Pt unable to recall situation and reports he was traveling last nigth to visit his daughter.   Therapeutic activity Squat-pivot transfers x2 and sit<>standx5 throughout tx  with up to Mod A and safety cues, R lean noted upon standing, needing Darryl facilitaton for upright support, flexed posture.   Gait in room with RW 1x25' and 1x12' with up to max A for trunk control and balance due to multi-directional instability as well as c/o R kne pain, pt reports premiorbid.   WC propulsion x100' with close S and cues for technique.   Nutep x 8 min for increased activity tolerance level 4>>5 with cognitive challenge to attend to stop time.   Standing balance 2 in min A for stability during beanbag toss while challenged cognitively with alternating attention to count bags. Pt needed Max A to sustain attention to task while tech talked to him. Time up limited by pain.  Seated food sorting task, pt needed Mod cues for appropriate food category, [erseverating on fruits and vegitaables, demonstrating minimal flexibility in  thinking strategies.   Pt lef tin veil bed ith ice for knee pain, RN aware of timing for pain meds. VSS throughout. Daughter present.        Therapy Documentation Precautions:  Precautions Precautions: Fall Restrictions Weight Bearing Restrictions: No   Pain: 8/10 head and R knee pain, modified tx and RN made aware, ice provided after tx   See Function Navigator for Current Functional Status.   Therapy/Group: Individual Therapy  Welborn Keena Virl Cagey, PT, DPT  07/09/2015, 1:26 PM

## 2015-07-09 NOTE — Progress Notes (Signed)
Occupational Therapy Session Note  Patient Details  Name: Darryl [name] MRN: [id number] Date of Birth: 1951-05-27  Today's Date: 07/09/2015 OT Individual Time: 0800-0900 OT Individual Time Calculation (min): 60 min    Short Term Goals: Week 2:  OT Short Term Goal 1 (Week 2): Pt will transfer to 3 in 1 over toilet with min A. OT Short Term Goal 2 (Week 2): Pt will transfer to shower seat with min A. OT Short Term Goal 3 (Week 2): Pt will sit to stand with min A to prepare for LB self care. OT Short Term Goal 4 (Week 2): Pt will manage clothing over hips for toileting tasks.  Skilled Therapeutic Interventions/Progress Updates:    Pt resting in bed upon arrival and agreeable to engaging in BADL retraining including bathing at shower level and dressing with sit<>stand from w/c at sink.  Pt oriented to place but not year or month.  Pt made comments throughout session regarding seizures within the past few days.  Chart review made no reference of recent seizure activity.  Pt completed all bathing tasks with supervision and no verbal cues for task initiation.  Pt exhibited difficulty orienting his shirt this morning and required assistance with orientation.  Pt completed balance of dressing tasks with no apparent difficulty with clothing orientation.  Focus on orientation, activity tolerance, sit<>stand, functional transfers, cognitive remediation, task initiation, sequencing, and safety awareness to increase independence with BADLs.  Therapy Documentation Precautions:  Precautions Precautions: Fall Restrictions Weight Bearing Restrictions: No  Pain: Pain Assessment Pain Assessment: Faces Pain Type: Acute pain Pain Location: Head Pain Orientation: Right Pain Descriptors / Indicators: Headache Pain Onset: Gradual Pain Intervention(s): medicated prior to therapy session See Function Navigator for Current Functional Status.   Therapy/Group: Individual Therapy  Rich Brave 07/09/2015, 9:03 AM

## 2015-07-09 NOTE — Progress Notes (Signed)
Recreational Therapy Session Note  Patient Details  Name: Darryl [name] MRN: [id number] Date of Birth: Oct 18, 1950 Today's Date: 07/09/2015  Pain: c/o HA, premedicated Skilled Therapeutic Interventions/Progress Updates: Pt lying in enclosure bed awake upon entry.  Pt agreeable to get out of bed for therapy session with minimal cuing.  Pt performed bed mobility and donned socks with supervision.  Pt performed squat pivot transfers bed<-->w/c with contact guard assist.  Once in the BI gym, pt given seven Scrabble letter tiles and instructed to come up with as many words as he could using those tiles.  Pt required mod assist for task completion,min cues for sustained attention. Therapy/Group: Co-Treatment   Jessie Cowher 07/09/2015, 2:51 PM

## 2015-07-09 NOTE — Progress Notes (Signed)
Orthopedic Tech Progress Note Patient Details:  Darryl [name] 1951-01-21 [id number]  Ortho Devices Type of Ortho Device: Postop shoe/boot Ortho Device/Splint Interventions: Application   [name] 07/09/2015, 6:05 PM

## 2015-07-09 NOTE — Progress Notes (Signed)
Speech Language Pathology Daily Session Note  Patient Details  Name: Darryl [name] MRN: [id number] Date of Birth: 1950/08/16  Today's Date: 07/09/2015 SLP Individual Time: 0900-1000 SLP Individual Time Calculation (min): 60 min  Short Term Goals: Week 2: SLP Short Term Goal 1 (Week 2): Patient will orient to time, place and situation with Mod A question and verbal cues.  SLP Short Term Goal 2 (Week 2): Patient will demonstrate sustained attention to functional tasks for 5 minutes with Mod A verbal cues for redirection. SLP Short Term Goal 3 (Week 2): Patient will identify 1 physical and 1 cognitive deficit with Min A verbal and question cues.  SLP Short Term Goal 4 (Week 2): Patient will utilize call bell to request assistance with Max A multimodal cues in 25% of opportunities.  SLP Short Term Goal 5 (Week 2): Patient will consume trials of Dys. 2 textures and demonstrate efficent mastication with minimal oral residue with Min A verbal cues to self-monitor and correct oral residue over 2 consecutive sessions prior to upgrade.  SLP Short Term Goal 6 (Week 2): Patient will consume current diet with minimal overt s/s of aspiration with Min A verbal cues for use of swallowing compensatory strategies.   Skilled Therapeutic Interventions: Skilled treatment session focused on cognitive goals. Patient independently requested "some fruit" and consumed 50% of snack of applesauce with thin liquids. Patient consumed without overt s/s of aspiration and required extra time and Min A verbal cues to open containers. SLP also facilitated session by providing Mod-Max A question and visual cues for functional problem solving in regards to sequencing 4 step picture cards. Patient perseverative on pain throughout session and required Mod-Max verbal cues for sustained attention to task for 1-2 minute intervals. Patient transferred back to bed at end of session with extra time and Max A verbal cues for problem solving and  sequencing with task. Patient left supine in enclosure bed with all needs within reach. Continue with current plan of care.    Function:  Eating Eating   Modified Consistency Diet: Yes Eating Assist Level: Supervision or verbal cues;More than reasonable amount of time           Cognition Comprehension Comprehension assist level: Understands basic 50 - 74% of the time/ requires cueing 25 - 49% of the time  Expression   Expression assist level: Expresses basic 50 - 74% of the time/requires cueing 25 - 49% of the time. Needs to repeat parts of sentences.  Social Interaction Social Interaction assist level: Interacts appropriately 25 - 49% of time - Needs frequent redirection.  Problem Solving Problem solving assist level: Solves basic 25 - 49% of the time - needs direction more than half the time to initiate, plan or complete simple activities  Memory Memory assist level: Recognizes or recalls less than 25% of the time/requires cueing greater than 75% of the time    Pain 5/10 pain in head. RN made aware and administered medications   Therapy/Group: Individual Therapy  Madhav Mohon 07/09/2015, 3:17 PM

## 2015-07-10 ENCOUNTER — Inpatient Hospital Stay (HOSPITAL_COMMUNITY): Payer: BC Managed Care – PPO | Admitting: *Deleted

## 2015-07-10 ENCOUNTER — Inpatient Hospital Stay (HOSPITAL_COMMUNITY): Payer: BC Managed Care – PPO | Admitting: Speech Pathology

## 2015-07-10 ENCOUNTER — Inpatient Hospital Stay (HOSPITAL_COMMUNITY): Payer: BC Managed Care – PPO | Admitting: Physical Therapy

## 2015-07-10 ENCOUNTER — Inpatient Hospital Stay (HOSPITAL_COMMUNITY): Payer: BC Managed Care – PPO

## 2015-07-10 LAB — GLUCOSE, CAPILLARY
Glucose-Capillary: 120 mg/dL — ABNORMAL HIGH (ref 65–99)
Glucose-Capillary: 125 mg/dL — ABNORMAL HIGH (ref 65–99)
Glucose-Capillary: 133 mg/dL — ABNORMAL HIGH (ref 65–99)
Glucose-Capillary: 85 mg/dL (ref 65–99)

## 2015-07-10 MED ORDER — GLUCERNA SHAKE PO LIQD
237.0000 mL | Freq: Two times a day (BID) | ORAL | Status: DC
  Administered 2015-07-10 – 2015-07-23 (×12): 237 mL via ORAL

## 2015-07-10 NOTE — Progress Notes (Signed)
Recreational Therapy Session Note  Patient Details  Name: Ladislao Cohenour MRN: [id number] Date of Birth: 06/12/1950 Today's Date: 07/10/2015  Pain: no c/o Skilled Therapeutic Interventions/Progress Updates: Session focused on orientation awareness.  Pt oriented to self, place with Mod I, date & situation with min cues.  Pt's daughter present and participatory in session.  Therapy/Group: Individual Therapy   Jayin Derousse 07/10/2015, 4:22 PM

## 2015-07-10 NOTE — Progress Notes (Signed)
Nutrition Follow-up  DOCUMENTATION CODES:   Not applicable  INTERVENTION:  Provide 30 ml Prostat po TID, each supplement provides 100 kcal and 15 grams of protein.   Provide Glucerna Shake po BID, each supplement provides 220 kcal and 10 grams of protein.  Encourage adequate PO intake.   NUTRITION DIAGNOSIS:   Inadequate oral intake related to lethargy/confusion as evidenced by meal completion < 50%; ongoing  GOAL:   Patient will meet greater than or equal to 90% of their needs; progressing  MONITOR:   PO intake, Supplement acceptance, Diet advancement, Weight trends, Labs, I & O's  REASON FOR ASSESSMENT:    (Supplements)    ASSESSMENT:   65 y.o. male with history fo HTN, DM type 2, PVCs, seizures who was seen in ED 06/10/15 with severe HA but CT head without acute changes. Late that day he had worsening of HA followed by N/V later with unresponsiveness. Patient intubated and sedated in ED. 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 Extubated  1/10  Meal completion has been varied from 0-75% with 25% meal completion at lunch today. Pt currently has Prostat ordered and has been consuming most of them. RD to additionally order Glucerna Shake to aid in caloric and protein needs as liquids are now thin. Spoke with RN regarding new orders.   Diet Order:  DIET - DYS 1 Room service appropriate?: Yes; Fluid consistency:: Thin  Skin:   (Incision on R head)  Last BM:  2/3  Height:   Ht Readings from Last 1 Encounters:  06/27/15  (1.803 m)    Weight:   Wt Readings from Last 1 Encounters:  07/04/15 162 lb 11.2 oz (73.8 kg)    Ideal Body Weight:  78 kg  BMI:  Body mass index is 22.7 kg/(m^2).  Estimated Nutritional Needs:   Kcal:  2000-2200  Protein:  90-110 grams  Fluid:  2- 2.2 L/day  EDUCATION NEEDS:   No education needs identified at this time  Roslyn Smiling, MS, RD, LDN Pager # [phone] After  hours/ weekend pager # [phone]

## 2015-07-10 NOTE — Patient Care Conference (Signed)
Inpatient RehabilitationTeam Conference and Plan of Care Update Date: 07/10/2015   Time: 2:30 PM    Patient Name: Darryl [name]      Medical Record Number: [id number]  Date of Birth: [birth year] Sex: Male         Room/Bed: 4W14C/4W14C-01 Payor Info: Payor: [employer] / Plan: [facility] PPO / Product Type: *No Product type* /    Admitting Diagnosis: SAH with cognitive deficits  Admit Date/Time:  06/27/2015  4:13 PM Admission Comments: No comment available   Primary Diagnosis:  SAH (subarachnoid hemorrhage) (HCC) Principal Problem: SAH (subarachnoid hemorrhage) (HCC)  Patient Active Problem List   Diagnosis Date Noted  . Acute deep vein thrombosis (DVT) of left femoral vein (HCC) 07/01/2015  . Pain   . Poor fluid intake   . Essential hypertension   . Type 2 diabetes mellitus with complication, without long-term current use of insulin (HCC)   . Disorientation   . Convulsions (HCC)   . Acute blood loss anemia   . Hypokalemia   . Ileus (HCC)   . Leukocytosis   . Right knee pain   . Cognitive safety issue   . Impulsiveness   . PVC (premature ventricular contraction)   . Abdominal discomfort   . SAH (subarachnoid hemorrhage) (HCC) 06/11/2015  . Emesis   . Subarachnoid hemorrhage (HCC) 06/10/2015  . Hypertension   . Diabetes mellitus without complication [facility])     Expected Discharge Date: Expected Discharge Date: 07/24/15  Team Members Present: Physician leading conference: Dr. Faith Rogue Social Worker Present: Amada Jupiter, LCSW Nurse Present: Carmie End, RN PT Present: Bayard Hugger, PT OT Present: Ardis Rowan, COTA;Jennifer Katrinka Blazing, OT SLP Present: Feliberto Gottron, SLP PPS Coordinator present : Tora Duck, RN, CRRN     Current Status/Progress Goal Weekly Team Focus  Medical   cognitiion perhaps improved but still with major attention and cognitive deficits.   see prior  cognition,    Bowel/Bladder   incontinent of bowel, LBM 2/3, occasionally  incontinent of urine  continent of bowel and bladdwer with max assist  continue to offer toileting q2h while awake   Swallow/Nutrition/ Hydration   Dys. 1 textures with thin liquids, Min-Mod A verbal cues for use of swallowing compensatory strategies   Supervision with least restrictive diet  trials of upgraded textures, family education    ADL's   BADLs-min A, functional transfers-mod A, min/mod verbal cues for task initiation/sequencing, max verbal cues for orientaiton,   supervision overall  activity tolerance, BADL retraining, cognitive remediation   Mobility   min-mod A transfers, gait up to 80 ft using RW with mod-max A, stairs with min A and 2 rails, supervision wheelchair mobility  supervision overall  orientation, initiation, awareness, safety, functional mobility, activity tolerance, pt/family education   Communication             Safety/Cognition/ Behavioral Observations  Mod-Max A   Min A  orientation, attention, problem solving, awareness    Pain   constant headache to anterior head, oxy  po q4h gives minimal relief  less than or equal to 4 on a scale of 0-1-  assess pain q4h, continue to monitor effectiveness   Skin   intact, dry, eucerin continues  no new skin injury/breakdown while on rehab  assess skin q shift, educate wife about skin care      *See Care Plan and progress notes for long and short-term goals.  Barriers to Discharge: see prior    Possible Resolutions to  Barriers:  supervision    Discharge Planning/Teaching Needs:  Plan at present is for pt to d/c home with wife providing care.  Wife very concerned with level of assistance he will need, however, insurance CM reports not likely that they would cover SNF.  Need to meet with wife and family.  To be scheduled.   Team Discussion:  Team feels ready for change from enclosure bed to hi-lo as cognition is improving overall.  Awareness and initiation also improved this week.  Still requiring mod-max  assistance with ambulation, however, not resisting this any longer.  Likely to reach min assist with ambulation and will need to see if this is a level that wife can manage.  Overall participation is much improved.  Team recommending a change in targeted d/c date to 2/21.  Revisions to Treatment Plan:  Change in target d/c date to 2/21;  Move from enclosure to hi-lo bed.   Continued Need for Acute Rehabilitation Level of Care: The patient requires daily medical management by a physician with specialized training in physical medicine and rehabilitation for the following conditions: Daily direction of a multidisciplinary physical rehabilitation program to ensure safe treatment while eliciting the highest outcome that is of practical value to the patient.: Yes Daily medical management of patient stability for increased activity during participation in an intensive rehabilitation regime.: Yes Daily analysis of laboratory values and/or radiology reports with any subsequent need for medication adjustment of medical intervention for : Neurological problems;Mood/behavior problems  Shekinah Pitones 07/10/2015, 3:52 PM

## 2015-07-10 NOTE — Progress Notes (Signed)
Speech Language Pathology Daily Session Note  Patient Details  Name: Darryl [name] MRN: [id number] Date of Birth: 08-21-1950  Today's Date: 07/10/2015 SLP Individual Time: 1478-2956 SLP Individual Time Calculation (min): 45 min  Short Term Goals: Week 2: SLP Short Term Goal 1 (Week 2): Patient will orient to time, place and situation with Mod A question and verbal cues.  SLP Short Term Goal 2 (Week 2): Patient will demonstrate sustained attention to functional tasks for 5 minutes with Mod A verbal cues for redirection. SLP Short Term Goal 3 (Week 2): Patient will identify 1 physical and 1 cognitive deficit with Min A verbal and question cues.  SLP Short Term Goal 4 (Week 2): Patient will utilize call bell to request assistance with Max A multimodal cues in 25% of opportunities.  SLP Short Term Goal 5 (Week 2): Patient will consume trials of Dys. 2 textures and demonstrate efficent mastication with minimal oral residue with Min A verbal cues to self-monitor and correct oral residue over 2 consecutive sessions prior to upgrade.  SLP Short Term Goal 6 (Week 2): Patient will consume current diet with minimal overt s/s of aspiration with Min A verbal cues for use of swallowing compensatory strategies.   Skilled Therapeutic Interventions: Skilled treatment session focused on dysphagia and cognitive goals. SLP facilitated session by administering trials of Dys. 2 textures. Patient demonstrated efficient mastication with minimal oral residue without overt s/s of aspiration, therefore, recommend trial tray prior to upgrade. SLP also facilitated session by providing extra time and Mod A verbal cues for recall and Min A verbal cues for problem solving during a basic money management task. Patient also required Mod A verbal and question cues for orientation to time and situation and demonstrated selective attention to a task for ~15 minutes in a moderately distracting environment with Min A verbal cues for  redirection. Patient handed off to PT. Continue with current plan of care.    Function:  Eating Eating   Modified Consistency Diet: Yes Eating Assist Level: Supervision or verbal cues;Set up assist for   Eating Set Up Assist For: Opening containers       Cognition Comprehension Comprehension assist level: Follows basic conversation/direction with no assist  Expression   Expression assist level: Expresses basic needs/ideas: With no assist  Social Interaction Social Interaction assist level: Interacts appropriately 50 - 74% of the time - May be physically or verbally inappropriate.  Problem Solving Problem solving assist level: Solves basic 25 - 49% of the time - needs direction more than half the time to initiate, plan or complete simple activities  Memory Memory assist level: Recognizes or recalls less than 25% of the time/requires cueing greater than 75% of the time    Pain Pain Assessment Pain Assessment: 0-10 Pain Score: 3  Pain Location: Head Pain Intervention(s): RN made aware;Distraction;Emotional support  Therapy/Group: Individual Therapy  Darryl [name] 07/10/2015, 3:48 PM

## 2015-07-10 NOTE — Progress Notes (Signed)
Physical Therapy Session Note  Patient Details  Name: Rayder Sullenger MRN: [id number] Date of Birth: [birth year]  Today's Date: 07/10/2015 PT Individual Time: 1116-1201 PT Individual Time Calculation (min): 45 min   Short Term Goals: Week 2:  PT Short Term Goal 1 (Week 2): Patient will ambulate 50 ft using LRAD with assist of one person.  PT Short Term Goal 2 (Week 2): Patient will transfer bed <> wheelchair with min A.  PT Short Term Goal 3 (Week 2): Patient will negotiate up/down 12 stairs with assist of 1 person.  PT Short Term Goal 4 (Week 2): Patient will maintain standing balance during functional task x 3 min.  Skilled Therapeutic Interventions/Progress Updates:    Handoff from SLP in day room.  Session focus on attention, problem solving, endurance, stair negotiation, and NMR.  Pt propelled w/c x150' to therapy gym with supervision and verbal cues for correct placement of hands on wheels for propulsion.  PT instructed pt in sit<>stand from w/c with close supervision and 3 minisquats with verbal cues for upright posture.  Sit>stand with supervision and PT instructed patient in negotiation of 12 steps with 2 hand rails.  Pt required steady assist and mod/max verbal cues for pattern of LE advancement for ascent/descent and advancing UEs along rails.  PT instructed pt in 2x3 min on Nustep for NMR and for attention to stop time of 3 minutes on each trial.  Pt able to stop exercise at 3:06 and 3:00 each trial without cues.  Pt amb x15' +10' with mod assist and max multimodal cues for safe walker positioning and advancement of RLE followed by LLE.  PT instructed patient in attention and memory activity retrieving horse shoes from basketball goal in various patterns.  Pt performed squat/pivot from mat>w/c with steady assist and PT propelled pt back to nursing station at end of session.  Pt left in w/c with QRB in place in nursing station.     Therapy Documentation Precautions:   Precautions Precautions: Fall Restrictions Weight Bearing Restrictions: No Pain: Pain Assessment Pain Assessment: 0-10 Pain Score: 6  Pain Location: Shoulder Pain Orientation: Right Pain Intervention(s): RN made aware;Distraction;Emotional support   See Function Navigator for Current Functional Status.   Therapy/Group: Individual Therapy  Ladora Daniel Penven-Crew 07/10/2015, 12:32 PM

## 2015-07-10 NOTE — Progress Notes (Signed)
Occupational Therapy Session Note  Patient Details  Name: Darryl [name] MRN: [id number] Date of Birth: 1950-12-11  Today's Date: 07/10/2015 OT Individual Time: 0700-0828 OT Individual Time Calculation (min): 88 min    Short Term Goals: Week 2:  OT Short Term Goal 1 (Week 2): Pt will transfer to 3 in 1 over toilet with min A. OT Short Term Goal 2 (Week 2): Pt will transfer to shower seat with min A. OT Short Term Goal 3 (Week 2): Pt will sit to stand with min A to prepare for LB self care. OT Short Term Goal 4 (Week 2): Pt will manage clothing over hips for toileting tasks.  Skilled Therapeutic Interventions/Progress Updates:    Pt resting in bed upon arrival and agreeable to therapy this morning.  Pt engaged in BADL retraining including bathing at shower level and dressing with sit<>stand from w/c at sink.  Pt oriented to place and situation but not city or date.  Pt initiated all bathing and dressing tasks when presented with supplies and clothing.  Pt required more than a reasonable amount of time to complete all tasks with multiple rest breaks throughout session.  Pt performed sit<>stand at sink with close supervision and was able to maintain standing balance without BUE support.  Focus on activity tolerance, sit<>stand, orientation, cognitve remediation, standing balance, functional transfers, and safety awareness to increase independence with BADLs.  Therapy Documentation Precautions:  Precautions Precautions: Fall Restrictions Weight Bearing Restrictions: No Pain:  Pt c/o 8/10 headache; RN admin meds during session  See Function Navigator for Current Functional Status.   Therapy/Group: Individual Therapy  Rich Brave 07/10/2015, 8:28 AM

## 2015-07-10 NOTE — Progress Notes (Addendum)
Social Work Patient ID: Darryl [name], male   DOB: 12-17-1950, 65 y.o.   MRN: [id number]  Met with pt's daughter, Darryl [name], following team conference today.  She is aware of change in d/c date to 2/21 understanding that team recommends this based on the gains they have seen this past week.  Pleased with plan to change to hi-lo bed tomorrow.  Explaining to daughter that feedback I had received from insurance CM is that they would likely NOT consider coverage of SNF stay and that our focus is to make sure pt's wife feels she can meet his care needs directly from CIR.  She reports that family is also talking about options of support to mother and considering several options (i.e. Moving them in with one of the children.)  Hopeful to make good gains in the next two weeks.  Wife will be here tomorrow and I will follow up with her at that time.  Lolah Coghlan, LCSW

## 2015-07-10 NOTE — Progress Notes (Signed)
[facility]     PROGRESS NOTE    Subjective/Complaints: Up in room eating breakfast. Participated in therapy this am. Slow to engage still.   .  ROS limited by cognition and behavior.   Objective: Vital Signs: Blood pressure 131/86, pulse 88, temperature 97.8 F (36.6 C), temperature source Oral, resp. rate 17, height 5\' 11"  (1.803 m), weight 73.8 kg (162 lb 11.2 oz), SpO2 96 %. No results found. No results for input(s): WBC, HGB, HCT, PLT in the last 72 hours. No results for input(s): NA, K, CL, GLUCOSE, BUN, CREATININE, CALCIUM in the last 72 hours.  Invalid input(s): CO CBG (last 3)   Recent Labs  07/09/15 1657 07/09/15 2030 07/10/15 0641  GLUCAP 109* 132* 120*    Wt Readings from Last 3 Encounters:  07/04/15 73.8 kg (162 lb 11.2 oz)  06/13/15 89.6 kg (197 lb 8.5 oz)  06/08/15 91.627 kg (202 lb)    Physical Exam:  Constitutional: He appears well-developed and well-nourished.  HENT:  Head: Normocephalic and atraumatic.  Small incision on scalp with a couple of sutures and staples--+/- soft.  Eyes: Conjunctivae are normal. Pupils are equal, round, and reactive to light.  Neck: Neck supple.  Limited ROM laterally and flexion limited due to pain.  Cardiovascular: Normal rate and regular rhythm.  Respiratory: Effort normal and breath sounds normal. No stridor. No respiratory distress. He has no wheezes.  GI: Soft. Bowel sounds are normal. He exhibits no distension. There is no tenderness.  Musculoskeletal: He exhibits no edema.  No tenderness in extremeties  Neurological: He is alert.  A&Ox2, name  Not oriented to situation Distracted/ lacks insight and awareness of deficits.  He was able to follow one and occasional two step motor commands.   Motor: B/L UE 4/5 B/L hip flexion 4-/5, ankle dorsi/plantar flexion 4+/5  Skin: Skin is warm and dry.  Psychiatric: Cognition and memory are impaired. He expresses inappropriate  judgment. He remains inattentive.     Assessment/Plan:  1. Cognitive, swallowing, and mobility deficits secondary to [facility] which require 3+ hours per day of interdisciplinary therapy in a comprehensive inpatient rehab setting. Physiatrist is providing close team supervision and 24 hour management of active medical problems listed below. Physiatrist and rehab team continue to assess barriers to discharge/monitor patient progress toward functional and medical goals.  Function:  Bathing Bathing position   Position: Shower  Bathing parts Body parts bathed by patient: Right arm, Left arm, Chest, Abdomen, Front perineal area, Right upper leg, Left upper leg, Buttocks, Right lower leg, Left lower leg Body parts bathed by helper: Back  Bathing assist Assist Level: Touching or steadying assistance(Pt > 75%)      Upper Body Dressing/Undressing Upper body dressing   What is the patient wearing?: Pull over shirt/dress     Pull over shirt/dress - Perfomed by patient: Thread/unthread right sleeve, Thread/unthread left sleeve, Put head through opening Pull over shirt/dress - Perfomed by helper: Pull shirt over trunk        Upper body assist Assist Level: Set up, Supervision or verbal cues   Set up : To obtain clothing/put away  Lower Body Dressing/Undressing Lower body dressing   What is the patient wearing?: Pants, Non-skid slipper socks, Ted Hose Underwear - Performed by patient: Thread/unthread right underwear leg, Thread/unthread left underwear leg Underwear - Performed by helper: Thread/unthread right underwear leg, Thread/unthread left underwear leg, Pull underwear up/down Pants- Performed by patient: Thread/unthread right pants leg, Thread/unthread left pants  leg, Pull pants up/down Pants- Performed by helper: Pull pants up/down Non-skid slipper socks- Performed by patient: Don/doff right sock, Don/doff left sock Non-skid slipper socks- Performed by helper: Don/doff right sock,  Don/doff left sock     Shoes - Performed by patient: Don/doff right shoe, Don/doff left shoe Shoes - Performed by helper: Fasten right, Fasten left       TED Hose - Performed by helper: Don/doff right TED hose, Don/doff left TED hose  Lower body assist Assist for lower body dressing: Touching or steadying assistance (Pt > 75%)      Toileting Toileting Toileting activity did not occur: N/A Toileting steps completed by patient: Performs perineal hygiene Toileting steps completed by helper: Adjust clothing prior to toileting, Adjust clothing after toileting, Performs perineal hygiene Toileting Assistive Devices: Grab bar or rail  Toileting assist Assist level: Touching or steadying assistance (Pt.75%)   Transfers Chair/bed transfer   Chair/bed transfer method: Stand pivot Chair/bed transfer assist level: Moderate assist (Pt 50 - 74%/lift or lower) Chair/bed transfer assistive device: Armrests     Locomotion Ambulation Ambulation activity did not occur: Refused   Max distance: 25 Assist level: Maximal assist (Pt 25 - 49%)   Wheelchair   Type: Manual Max wheelchair distance: 100 Assist Level: Supervision or verbal cues  Cognition Comprehension Comprehension assist level: Follows basic conversation/direction with no assist  Expression Expression assist level: Expresses basic needs/ideas: With no assist  Social Interaction Social Interaction assist level: Interacts appropriately 25 - 49% of time - Needs frequent redirection.  Problem Solving Problem solving assist level: Solves basic 25 - 49% of the time - needs direction more than half the time to initiate, plan or complete simple activities  Memory Memory assist level: Recognizes or recalls less than 25% of the time/requires cueing greater than 75% of the time   Medical Problem List and Plan: 1. 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   -team conference today. Some progress with attention but still quite delayed/distracted 2. LLE DVT: eliquis. No bleeding sequelae 3. Pain Management:  oxycodone prn for headaches and back/leg pain.   -see #15 below 4. Mood: LCSW to follow for evaluation and support.  5. Neuropsych: This patient is not  capable of making decisions on his own behalf.  -continue to provide appropriate environment for sleep and communication  -environmental mod  -  sleep chart  -increased ritalin to  bid 6. Skin/Wound Care: routine pressure relief measures.  7. Fluids/Electrolytes/Nutrition: intake inconsistent. Added nutritional supplements. Monitor for now.  .  -encourage PO 8. HTN: Monitor BP tid   -resumed low dose Cozaar.   -control had fair 9. DM type 2: Monitor BS ac/hs basis. Po intake has been variable due to lethargy and cognitive issues. Wife encouraged to bring pureed foods from home.   -continue to hold metformin and use SSI for now. sugars remain under control 10. Seizures disorder: Was on lamictal bid prior to admission. On keppra PO bid   11. ABLA: continue iron supplement..  12. Mild ileus:   two Senna S bid. Use enema prn.    13. Hypokalemia: Supplement to avoid recurrence of ileus.  14. Leucocytosis: ua neg, cx with multispecies  -he is afebrile  15. Chronic right knee pain/lower ext pain: improving   -voltaren gel, prn ICE  16. PVCs: Negative cardiac work up 05/2015 17. Safety:   enclosure bed for fall prevention/safety is  still required   -order renewed again today--d/w team at conference today regarding liberation   LOS (Days) 13 A FACE TO FACE EVALUATION WAS PERFORMED  Kiva Norland T 07/10/2015 9:16 AM

## 2015-07-10 NOTE — Progress Notes (Signed)
Occupational Therapy Note  Patient Details  Name: Darryl [name] MRN: [id number] Date of Birth: July 18, 1950  Today's Date: 07/10/2015 OT Individual Time: 1400-1430 OT Individual Time Calculation (min): 30 min   Pt denied pain Individual Therapy  Pt resting in enclosure bed with daughter present. Pt oriented to place but not city or date.  Pt stated he felt like he wasn't making progress.  Discussed progress with patient and LTGs. Pt amb with RW to door and back with min A.  Focus on orientation, cognitive remediation, functional amb with RW, and safety awareness.   [name] [facility] 07/10/2015, 2:53 PM

## 2015-07-11 ENCOUNTER — Inpatient Hospital Stay (HOSPITAL_COMMUNITY): Payer: BC Managed Care – PPO | Admitting: *Deleted

## 2015-07-11 ENCOUNTER — Inpatient Hospital Stay (HOSPITAL_COMMUNITY): Payer: BC Managed Care – PPO | Admitting: Physical Therapy

## 2015-07-11 ENCOUNTER — Inpatient Hospital Stay (HOSPITAL_COMMUNITY): Payer: BC Managed Care – PPO | Admitting: Speech Pathology

## 2015-07-11 LAB — GLUCOSE, CAPILLARY
Glucose-Capillary: 104 mg/dL — ABNORMAL HIGH (ref 65–99)
Glucose-Capillary: 150 mg/dL — ABNORMAL HIGH (ref 65–99)
Glucose-Capillary: 97 mg/dL (ref 65–99)
Glucose-Capillary: 110 mg/dL — ABNORMAL HIGH (ref 65–99)

## 2015-07-11 MED ORDER — METHYLPHENIDATE HCL 5 MG PO TABS
15.0000 mg | ORAL_TABLET | Freq: Two times a day (BID) | ORAL | Status: DC
  Administered 2015-07-11 – 2015-07-24 (×27): 15 mg via ORAL
  Filled 2015-07-11 (×27): qty 3

## 2015-07-11 NOTE — Progress Notes (Signed)
Occupational Therapy Session Note  Patient Details  Name: Darryl [name] MRN: [id number] Date of Birth: [birth year]  Today's Date: 07/11/2015 OT Individual Time: 0865-7846 OT Individual Time Calculation (min): 75 min    Short Term Goals: Week 2:  OT Short Term Goal 1 (Week 2): Pt will transfer to 3 in 1 over toilet with min A. OT Short Term Goal 2 (Week 2): Pt will transfer to shower seat with min A. OT Short Term Goal 3 (Week 2): Pt will sit to stand with min A to prepare for LB self care. OT Short Term Goal 4 (Week 2): Pt will manage clothing over hips for toileting tasks.  Skilled Therapeutic Interventions/Progress Updates:    Pt resting in enclosure bed upon arrival.  Pt agreeable to engaging in BADL retraining including bathing at shower level and dressing with sit<>stand from w/c at sink.  Pt completed all BADLs with close supervision.  Pt initiated all tasks when presented with supplies and/or clothing.  Pt required min A/steady A for transfers.  Pt initially not oriented to place, date, or situation.  Pt oriented X 3 later in the session.  Pt engaged in self feeding tasks with supervision.  Pt became tearful during session, thinking it was Christmas and how lucky he was to have family.  Pt's conversation continues to be tangential requiring mod verbal cues to redirect to task/topic.  Focus on cognitive remediation, functional transfers, sit<>stand, standing balance, and safety awareness to increase independence with BADLs.  Therapy Documentation Precautions:  Precautions Precautions: Fall Restrictions Weight Bearing Restrictions: No Pain: Pain Assessment Pain Assessment: 0-10 Pain Score: 7  Pain Location: Head Pain Descriptors / Indicators: Headache Pain Intervention(s): RN made aware  See Function Navigator for Current Functional Status.   Therapy/Group: Individual Therapy  Rich Brave 07/11/2015, 8:17 AM

## 2015-07-11 NOTE — Progress Notes (Signed)
Physical Therapy Weekly Progress Note  Patient Details  Name: Darryl [name] MRN: [id number] Date of Birth: [birth year]  Beginning of progress report period: July 04, 2015 End of progress report period: July 12, 2015  Today's Date: 07/12/2015 PT Individual Time: 1520-1620 PT Individual Time Calculation (min): 60 min   Patient has met 4 of 4 short term goals. Patient has made steady gains this week with functional mobility, currently requiring supervision-min A overall for household distance ambulation using RW. Patient's progress appears limited by increased premorbid R knee pain, which family reports patient had planned to have surgery before hospitalization. Patient currently required mod-max multimodal cues for orientation, awareness, safety, problem solving, and recall. Patient's daughter and wife have been present and family education initiated over last 2 days. Patient and family will benefit from ongoing education/initiation of hands-on training.   Patient continues to demonstrate the following deficits: pain, muscle weakness, muscle tightness, decreased endurance, decreased orientation, decreased attention, decreased awareness, decreased problem solving, decreased safety awareness, decreased memory, decreased standing balance, decreased postural control, and decreased balance strategies and therefore will continue to benefit from skilled PT intervention to enhance overall performance with activity tolerance, balance, postural control, ability to compensate for deficits, functional use of  right lower extremity and left lower extremity, attention, awareness and coordination.  Patient progressing toward long term goals.  Continue plan of care.  PT Short Term Goals Week 2:  PT Short Term Goal 1 (Week 2): Patient will ambulate 50 ft using LRAD with assist of one person.  PT Short Term Goal 1 - Progress (Week 2): Met PT Short Term Goal 2 (Week 2): Patient will transfer bed <> wheelchair with  min A.  PT Short Term Goal 2 - Progress (Week 2): Met PT Short Term Goal 3 (Week 2): Patient will negotiate up/down 12 stairs with assist of 1 person.  PT Short Term Goal 4 (Week 2): Patient will maintain standing balance during functional task x 3 min. PT Short Term Goal 4 - Progress (Week 2): Met Week 3:  PT Short Term Goal 1 (Week 3): Patient will ambulate 150 ft using RW with min A.  PT Short Term Goal 2 (Week 3): Patient will negotiate up/down 10 stairs using 1 rail with min A.  PT Short Term Goal 3 (Week 3): Patient will be oriented to time, place, and situation with mod verbal cues.   Skilled Therapeutic Interventions/Progress Updates:   Patient resting in bed with wife and daughter present for session. With increased time, patient sat edge of bed and donned shoes with supervision and min cues for attention to task. Focus on functional mobility training, attention, awareness, orientation, and recall with sit <> stand transfers using RW with supervision, functional ambulation x 50 ft + 50 ft + 100 ft + 50 ft using RW with min A overall and max multimodal cues for upright posture and safe use of RW as patient pushes RW too far in front of him, stair negotiation to simulate home entry up/down 8 (6") stairs using R rail ascending with step-to pattern leading LLE ascending and RLE descending and min A overall, and seated > standing pipetree puzzle of mild difficulty with max multimodal cues for problem solving and sequencing. Patient limited throughout session by fatigue and R knee pain and required multiple seated rest breaks. Patient oriented to location and month and required mod cues for situation. Patient left supine in bed with family present and call bell in reach.    Therapy Documentation  Precautions:  Precautions Precautions: Fall Restrictions Weight Bearing Restrictions: No Pain: Pain Assessment Pain Assessment: 0-10 Pain Score: 7  Pain Type: Acute pain Pain Location: Knee Pain  Orientation: Right Pain Descriptors / Indicators: Aching Pain Onset: With Activity Pain Intervention(s): Ambulation/increased activity;Rest   See Function Navigator for Current Functional Status.  Therapy/Group: Individual Therapy  Laretta Alstrom 07/12/2015, 5:21 PM

## 2015-07-11 NOTE — Progress Notes (Signed)
Physical Therapy Session Note  Patient Details  Name: Darryl [name] MRN: [id number] Date of Birth: 1950-06-13  Today's Date: 07/11/2015 PT Individual Time: 0900-1000 PT Individual Time Calculation (min): 60 min   Short Term Goals: Week 2:  PT Short Term Goal 1 (Week 2): Patient will ambulate 50 ft using LRAD with assist of one person.  PT Short Term Goal 2 (Week 2): Patient will transfer bed <> wheelchair with min A.  PT Short Term Goal 3 (Week 2): Patient will negotiate up/down 12 stairs with assist of 1 person.  PT Short Term Goal 4 (Week 2): Patient will maintain standing balance during functional task x 3 min.  Skilled Therapeutic Interventions/Progress Updates:   Session focused on orientation, initiation, safety awareness, and activity tolerance with functional mobility training. Patient received at RN station, RN administering pain medication due to headache. Patient's daughter Chantel present to observe session. Patient propelled wheelchair using BUE with supervision and cues for hand placement on rim instead of spokes and doffed leg rests with min verbal/visual cues, performed squat pivot and sit <> stand transfers using RW with supervision, performed functional ambulation using RW x 35 ft + 75 ft + 20 ft with min A overall and greatly improved safety with postural control and RW management as well as decreased speed, and negotiated up/down 12 (6") stairs using 2 rails with supervision and increased time, variable reciprocal and step-to pattern with cues for step-to pattern leading with LLE ascending and RLE descending due to R knee pain. Patient and daughter educated on progress to date with functional mobility and switching out enclosure bed for high-low bed. Patient disagreed with PT about making progress but daughter providing encouragement throughout session. Patient oriented to date but not year, city, or situation. Patient left sitting in wheelchair with quick release belt on to return  to room with daughter.    Therapy Documentation Precautions:  Precautions Precautions: Fall Restrictions Weight Bearing Restrictions: No Pain: Pain Assessment Pain Assessment: 0-10 Pain Score: 7  Pain Location: Head Pain Descriptors / Indicators: Headache Pain Intervention(s): RN made aware   See Function Navigator for Current Functional Status.   Therapy/Group: Individual Therapy  Kerney Elbe 07/11/2015, 10:03 AM

## 2015-07-11 NOTE — Progress Notes (Signed)
Speech Language Pathology Daily Session Note  Patient Details  Name: Darryl [name] MRN: [id number] Date of Birth: 1950-06-07  Today's Date: 07/11/2015 SLP Individual Time: 1105-1205 SLP Individual Time Calculation (min): 60 min  Short Term Goals: Week 2: SLP Short Term Goal 1 (Week 2): Patient will orient to time, place and situation with Mod A question and verbal cues.  SLP Short Term Goal 2 (Week 2): Patient will demonstrate sustained attention to functional tasks for 5 minutes with Mod A verbal cues for redirection. SLP Short Term Goal 3 (Week 2): Patient will identify 1 physical and 1 cognitive deficit with Min A verbal and question cues.  SLP Short Term Goal 4 (Week 2): Patient will utilize call bell to request assistance with Max A multimodal cues in 25% of opportunities.  SLP Short Term Goal 5 (Week 2): Patient will consume trials of Dys. 2 textures and demonstrate efficent mastication with minimal oral residue with Min A verbal cues to self-monitor and correct oral residue over 2 consecutive sessions prior to upgrade.  SLP Short Term Goal 6 (Week 2): Patient will consume current diet with minimal overt s/s of aspiration with Min A verbal cues for use of swallowing compensatory strategies.   Skilled Therapeutic Interventions: Skilled treatment session focused on cognitive and dysphagia goals. SLP facilitated session by providing Max A question and verbal cues for orientation to date and place, however, patient was Mod I for orientation to situation. Patient independently requested to use the bathroom and required Min A verbal cues for safety with task. SLP also facilitated session by providing Mod-Max A verbal cues for a small rate of self-feeding and to self-monitor and correct mild-moderate buccal/oral residue with trial tray of Dys. 2 textures with thin liquids. Patient consumed meal without overt s/s of aspiration. Recommend patient upgrade to Dys. 2 textures and continue full supervision  for safety. Patient left upright in wheelchair with family present. Continue with current plan of care.    Function:  Eating Eating   Modified Consistency Diet: Yes Eating Assist Level: Supervision or verbal cues           Cognition Comprehension Comprehension assist level: Understands basic 75 - 89% of the time/ requires cueing 10 - 24% of the time  Expression   Expression assist level: Expresses basic needs/ideas: With no assist  Social Interaction Social Interaction assist level: Interacts appropriately 50 - 74% of the time - May be physically or verbally inappropriate.  Problem Solving Problem solving assist level: Solves basic 25 - 49% of the time - needs direction more than half the time to initiate, plan or complete simple activities  Memory Memory assist level: Recognizes or recalls less than 25% of the time/requires cueing greater than 75% of the time    Pain    Therapy/Group: Individual Therapy  Fadumo Heng 07/11/2015, 12:17 PM

## 2015-07-11 NOTE — Progress Notes (Signed)
[place] PHYSICAL MEDICINE & REHABILITATION     PROGRESS NOTE    Subjective/Complaints: Eating breakfast. Just finished with OT. Apparently had headache early---denies one now  .  ROS limited by cognition and behavior.   Objective: Vital Signs: Blood pressure 119/74, pulse 81, temperature 98.7 F (37.1 C), temperature source Oral, resp. rate 18, height  (1.803 m), weight 73.8 kg (162 lb 11.2 oz), SpO2 100 %. No results found. No results for input(s): WBC, HGB, HCT, PLT in the last 72 hours. No results for input(s): NA, K, CL, GLUCOSE, BUN, CREATININE, CALCIUM in the last 72 hours.  Invalid input(s): CO CBG (last 3)   Recent Labs  07/10/15 1708 07/10/15 2040 07/11/15 0630  GLUCAP 133* 85 104*    Wt Readings from Last 3 Encounters:  07/04/15 73.8 kg (162 lb 11.2 oz)  06/13/15 89.6 kg (197 lb 8.5 oz)  06/08/15 91.627 kg (202 lb)    Physical Exam:  Constitutional: He appears well-developed and well-nourished.  HENT:  Head: Normocephalic and atraumatic.  Small incision on scalp with a couple of sutures and staples--+/- soft.  Eyes: Conjunctivae are normal. Pupils are equal, round, and reactive to light.  Neck: Neck supple.  Limited ROM laterally and flexion limited due to pain.  Cardiovascular: Normal rate and regular rhythm.  Respiratory: Effort normal and breath sounds normal. No stridor. No respiratory distress. He has no wheezes.  GI: Soft. Bowel sounds are normal. He exhibits no distension. There is no tenderness.  Musculoskeletal: He exhibits no edema.  No tenderness in extremeties  Neurological: He is alert.  A&Ox2.  name, hospital, [place]  Not oriented to situation Distracted/ lacks insight and awareness of deficits. Shies away from eye contact He was able to follow one and occasional two step motor commands.   Motor: B/L UE 4/5 B/L hip flexion 4-/5, ankle dorsi/plantar flexion 4+/5  Skin: Skin is warm and dry.  Psychiatric: Cognition and  memory are impaired. He expresses inappropriate judgment. He remains inattentive.     Assessment/Plan:  1. Cognitive, swallowing, and mobility deficits secondary to [facility] which require 3+ hours per day of interdisciplinary therapy in a comprehensive inpatient rehab setting. Physiatrist is providing close team supervision and 24 hour management of active medical problems listed below. Physiatrist and rehab team continue to assess barriers to discharge/monitor patient progress toward functional and medical goals.  Function:  Bathing Bathing position   Position: Shower  Bathing parts Body parts bathed by patient: Right arm, Left arm, Chest, Abdomen, Front perineal area, Right upper leg, Left upper leg, Buttocks, Right lower leg, Left lower leg Body parts bathed by helper: Back  Bathing assist Assist Level: Touching or steadying assistance(Pt > 75%)      Upper Body Dressing/Undressing Upper body dressing   What is the patient wearing?: Pull over shirt/dress     Pull over shirt/dress - Perfomed by patient: Thread/unthread right sleeve, Thread/unthread left sleeve, Put head through opening Pull over shirt/dress - Perfomed by helper: Pull shirt over trunk        Upper body assist Assist Level: Set up, Supervision or verbal cues   Set up : To obtain clothing/put away  Lower Body Dressing/Undressing Lower body dressing   What is the patient wearing?: Pants, Ted Hose, Shoes Underwear - Performed by patient: Thread/unthread right underwear leg, Thread/unthread left underwear leg Underwear - Performed by helper: Thread/unthread right underwear leg, Thread/unthread left underwear leg, Pull underwear up/down Pants- Performed by patient: Thread/unthread right pants leg, Thread/unthread  left pants leg, Pull pants up/down Pants- Performed by helper: Pull pants up/down Non-skid slipper socks- Performed by patient: Don/doff right sock, Don/doff left sock Non-skid slipper socks- Performed by  helper: Don/doff right sock, Don/doff left sock     Shoes - Performed by patient: Don/doff right shoe, Don/doff left shoe, Fasten right, Fasten left Shoes - Performed by helper: Fasten right, Fasten left       TED Hose - Performed by helper: Don/doff right TED hose, Don/doff left TED hose  Lower body assist Assist for lower body dressing: Touching or steadying assistance (Pt > 75%)      Toileting Toileting Toileting activity did not occur: N/A Toileting steps completed by patient: Performs perineal hygiene Toileting steps completed by helper: Adjust clothing prior to toileting, Adjust clothing after toileting, Performs perineal hygiene Toileting Assistive Devices: Grab bar or rail  Toileting assist Assist level: Touching or steadying assistance (Pt.75%)   Transfers Chair/bed transfer   Chair/bed transfer method: Stand pivot, Ambulatory Chair/bed transfer assist level: Touching or steadying assistance (Pt > 75%) Chair/bed transfer assistive device: Walker, Designer, fashion/clothing Ambulation activity did not occur: Refused   Max distance: 15+10 Assist level: Moderate assist (Pt 50 - 74%)   Wheelchair   Type: Manual Max wheelchair distance: 150 Assist Level: Supervision or verbal cues  Cognition Comprehension Comprehension assist level: Follows basic conversation/direction with no assist  Expression Expression assist level: Expresses basic needs/ideas: With no assist  Social Interaction Social Interaction assist level: Interacts appropriately 50 - 74% of the time - May be physically or verbally inappropriate.  Problem Solving Problem solving assist level: Solves basic 25 - 49% of the time - needs direction more than half the time to initiate, plan or complete simple activities  Memory Memory assist level: Recognizes or recalls less than 25% of the time/requires cueing greater than 75% of the time   Medical Problem List and Plan: 1. 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   -team conference today. Some progress with attention but still quite delayed/distracted 2. LLE DVT: eliquis. No bleeding sequelae 3. Pain Management:  oxycodone prn for headaches and back/leg pain.  4. Mood: LCSW to follow for evaluation and support.  5. Neuropsych: This patient is not  capable of making decisions on his own behalf.  -continue to provide appropriate environment for sleep and communication  -environmental mod  -  sleep chart  -increase ritalin to 15 mg bid 6. Skin/Wound Care: routine pressure relief measures.  7. Fluids/Electrolytes/Nutrition: intake inconsistent. Added nutritional supplements. Monitor for now.  .  -encourage PO 8. HTN: Monitor BP tid   -resumed low dose Cozaar.   -control had fair 9. DM type 2: Monitor BS ac/hs basis. Po intake has been variable due to lethargy and cognitive issues. Wife encouraged to bring pureed foods from home.   -continue to hold metformin and use SSI for now. sugars remain under control 10. Seizures disorder: Was on lamictal bid prior to admission. On keppra PO bid   11. ABLA: continue iron supplement..  12. Mild ileus:   two Senna S bid. Use enema prn.    13. Hypokalemia: Supplement to avoid recurrence of ileus.  14. Leucocytosis: ua neg, cx with multispecies  -remains afebrile  15. Chronic right knee pain/lower ext pain: improving   -voltaren gel, prn ICE  16. PVCs: Negative cardiac work up 05/2015 17. Safety:  Will transition to low bed---doesn't appear at risk for getting OOB  -room next to RN station  LOS (Days) 14 A FACE TO FACE EVALUATION WAS PERFORMED  Darryl [name] T 07/11/2015 9:17 AM

## 2015-07-12 ENCOUNTER — Inpatient Hospital Stay (HOSPITAL_COMMUNITY): Payer: BC Managed Care – PPO | Admitting: Speech Pathology

## 2015-07-12 ENCOUNTER — Inpatient Hospital Stay (HOSPITAL_COMMUNITY): Payer: BC Managed Care – PPO

## 2015-07-12 ENCOUNTER — Inpatient Hospital Stay (HOSPITAL_COMMUNITY): Payer: BC Managed Care – PPO | Admitting: Physical Therapy

## 2015-07-12 LAB — BASIC METABOLIC PANEL
Anion gap: 10 (ref 5–15)
BUN: 10 mg/dL (ref 6–20)
CO2: 25 mmol/L (ref 22–32)
Calcium: 9.4 mg/dL (ref 8.9–10.3)
Chloride: 102 mmol/L (ref 101–111)
Creatinine, Ser: 1.03 mg/dL (ref 0.61–1.24)
GFR calc Af Amer: >60 mL/min (ref >60)
GFR calc non Af Amer: >60 mL/min (ref >60)
Glucose, Bld: 156 mg/dL — ABNORMAL HIGH (ref 65–99)
Potassium: 4.1 mmol/L (ref 3.5–5.1)
Sodium: 137 mmol/L (ref 135–145)

## 2015-07-12 LAB — CBC
HCT: 36.5 % — ABNORMAL LOW (ref 39.0–52.0)
Hemoglobin: 12.3 g/dL — ABNORMAL LOW (ref 13.0–17.0)
MCH: 26.2 pg (ref 26.0–34.0)
MCHC: 33.7 g/dL (ref 30.0–36.0)
MCV: 77.8 fL — ABNORMAL LOW (ref 78.0–100.0)
Platelets: 314 10*3/uL (ref 150–400)
RBC: 4.69 MIL/uL (ref 4.22–5.81)
RDW: 14.5 % (ref 11.5–15.5)
WBC: 5.4 10*3/uL (ref 4.0–10.5)

## 2015-07-12 LAB — GLUCOSE, CAPILLARY
Glucose-Capillary: 154 mg/dL — ABNORMAL HIGH (ref 65–99)
Glucose-Capillary: 87 mg/dL (ref 65–99)
Glucose-Capillary: 99 mg/dL (ref 65–99)
Glucose-Capillary: 114 mg/dL — ABNORMAL HIGH (ref 65–99)

## 2015-07-12 MED ORDER — POLYETHYLENE GLYCOL 3350 17 G PO PACK
17.0000 g | PACK | Freq: Two times a day (BID) | ORAL | Status: AC
  Administered 2015-07-12 (×2): 17 g via ORAL
  Filled 2015-07-12: qty 1

## 2015-07-12 MED ORDER — POLYETHYLENE GLYCOL 3350 17 G PO PACK
17.0000 g | PACK | Freq: Every day | ORAL | Status: DC
  Administered 2015-07-13 – 2015-07-24 (×12): 17 g via ORAL
  Filled 2015-07-12 (×12): qty 1

## 2015-07-12 NOTE — Progress Notes (Signed)
Occupational Therapy Weekly Progress Note  Patient Details  Name: Darryl [name] MRN: [id number] Date of Birth: 1950/09/26  Beginning of progress report period: July 04, 2015 End of progress report period: July 12, 2015  Patient has met 3 of 4 short term goals.  Pt made steady progress with BADLs since the last weekly progress note.  Pt is min A/steady A for bathing and dressing tasks.  Pt requires mod A/min A for shower transfers and min A for toilet transfers.  Pt continues to require steady A/min A when standing to complete LB bathing and dressing tasks.  Pt continues to require max verbal cues for orientation and to recall activities from earlier in day or previous day.  Pt initiates bathing and dressing tasks when presented with supplies and clothing.  Pt's conversation continues to be tangential and pt requires mod verbal cues to attend to tasks. Family has not been present for OT sessions.  Patient continues to demonstrate the following deficits: decreased memory, decreased orientation, decreased endurance, decreased balance, and joint pain and therefore will continue to benefit from skilled OT intervention to enhance overall performance with BADL.  Patient progressing toward long term goals..  Continue plan of care.  OT Short Term Goals Week 2:  OT Short Term Goal 1 (Week 2): Pt will transfer to 3 in 1 over toilet with min A. OT Short Term Goal 1 - Progress (Week 2): Met OT Short Term Goal 2 (Week 2): Pt will transfer to shower seat with min A. OT Short Term Goal 2 - Progress (Week 2): Partly met OT Short Term Goal 3 (Week 2): Pt will sit to stand with min A to prepare for LB self care. OT Short Term Goal 3 - Progress (Week 2): Met OT Short Term Goal 4 (Week 2): Pt will manage clothing over hips for toileting tasks. OT Short Term Goal 4 - Progress (Week 2): Met Week 3:  OT Short Term Goal 1 (Week 3): Pt will transfer to shower seat with min A and min verbal cues for sequencing OT  Short Term Goal 2 (Week 3): Pt will complete LB dressing with supervision OT Short Term Goal 3 (Week 3): Pt will perform toileting tasks with supervison OT Short Term Goal 4 (Week 3): Pt will recall activities from previous day wil mod verbal cues and using compensatory strategies      Therapy Documentation Precautions:  Precautions Precautions: Fall Restrictions Weight Bearing Restrictions: No  See Function Navigator for Current Functional Status.   [name] [facility] 07/12/2015, 6:45 AM

## 2015-07-12 NOTE — Progress Notes (Signed)
Occupational Therapy Session Note  Patient Details  Name: Darryl [name] MRN: [id number] Date of Birth: February 19, 1951  Today's Date: 07/12/2015 OT Individual Time: 4098-1191 OT Individual Time Calculation (min): 75 min    Short Term Goals: Week 3:  OT Short Term Goal 1 (Week 3): Pt will transfer to shower seat with min A and min verbal cues for sequencing OT Short Term Goal 2 (Week 3): Pt will complete LB dressing with supervision OT Short Term Goal 3 (Week 3): Pt will perform toileting tasks with supervison OT Short Term Goal 4 (Week 3): Pt will recall activities from previous day wil mod verbal cues and using compensatory strategies  Skilled Therapeutic Interventions/Progress Updates:    Pt resting in bed upon arrival and agreeable to participating in therapy with min encouragement.  Pt consistently c/o being cold but can be encouraged to participate in morning BADLs at shower level.  Pt amb with RW into bathroom to use toilet prior to transferring to shower.  Pt completed all bathing and dressing tasks with close supervision.  Pt requires more than a reasonable amount of time to complete tasks.  Pt oriented to being at [facility] but not to situation, city, or date.  Pt transitioned to self feeding, eating approx 25% of meal.  Pt's conversation was tangential throughout session and required min verbal cues to redirect.  Pt continues to perseverate on deficits and requires reinforcement on progress during hospital stay.   Therapy Documentation Precautions:  Precautions Precautions: Fall Restrictions Weight Bearing Restrictions: No Pain: Pain Assessment Pain Assessment: Faces Faces Pain Scale: Hurts even more Pain Location: Head Pain Orientation: Right Pain Descriptors / Indicators: Headache Pain Onset: On-going Pain Intervention(s): RN made aware  See Function Navigator for Current Functional Status.   Therapy/Group: Individual Therapy  Rich Brave 07/12/2015, 8:18  AM

## 2015-07-12 NOTE — Progress Notes (Signed)
Speech Language Pathology Daily Session Notes  Patient Details  Name: Darryl [name] MRN: [id number] Date of Birth: [birth year]  Today's Date: 07/12/2015  Session 1: SLP Individual Time: 1135-1200 SLP Individual Time Calculation (min): 25 min   Session 2: SLP Individual Time: 1300-1330 SLP Individual Time Calculation (min): 30 min  Short Term Goals: Week 2: SLP Short Term Goal 1 (Week 2): Patient will orient to time, place and situation with Mod A question and verbal cues.  SLP Short Term Goal 2 (Week 2): Patient will demonstrate sustained attention to functional tasks for 5 minutes with Mod A verbal cues for redirection. SLP Short Term Goal 3 (Week 2): Patient will identify 1 physical and 1 cognitive deficit with Min A verbal and question cues.  SLP Short Term Goal 4 (Week 2): Patient will utilize call bell to request assistance with Max A multimodal cues in 25% of opportunities.  SLP Short Term Goal 5 (Week 2): Patient will consume trials of Dys. 2 textures and demonstrate efficent mastication with minimal oral residue with Min A verbal cues to self-monitor and correct oral residue over 2 consecutive sessions prior to upgrade.  SLP Short Term Goal 6 (Week 2): Patient will consume current diet with minimal overt s/s of aspiration with Min A verbal cues for use of swallowing compensatory strategies.   Skilled Therapeutic Interventions:  Session 1: Skilled treatment session focused on dysphagia and cognitive goals. Upon arrival, patient was grimacing and moaning from stomach pain due to constipation. RN made aware and patient was premedicated. Patient requested to use the bathroom and required Mod A verbal cues for safety due to impulsivity. While patient was on the commode, clinician provided education to the patient's daughter in regards to patient's current cognitive function and strategies to utilize to maximize orientation like visual aids. She verbalized understanding and requested to initiate  larger and more colorful aids to place on his walls, this clinician was agreeable. Patient left on commode with RN present. Continue with current plan of care.   Session 2: Skilled treatment session focused on dysphagia goals. Upon arrival, patient sitting upright in wheelchair and required encouragement for PO intake. Patient consumed lunch meal of Dys. 2 textures with thin liquids without overt s/s of aspiration but required more than a reasonable amount of time for mastication with moderate oral residue that patient was able to clear with Max A verbal cues. Patient required Max A for initiation of bites with clinician scooping food onto utensil and mastication was impacted by decreased attention and verbosity. Patient's daughter, Darryl [name] was present and provided appropriate cueing throughout meal, therefore, she is signed off to provide full supervision with meals. Patient left upright in wheelchair with daughter present. Continue with current plan of care.  Function:  Eating Eating   Modified Consistency Diet: Yes Eating Assist Level: Supervision or verbal cues;More than reasonable amount of time;Helper scoops food on utensil   Eating Set Up Assist For: Opening containers Helper Scoops Food on Utensil: Occasionally     Cognition Comprehension Comprehension assist level: Understands basic 75 - 89% of the time/ requires cueing 10 - 24% of the time  Expression   Expression assist level: Expresses basic needs/ideas: With extra time/assistive device  Social Interaction Social Interaction assist level: Interacts appropriately 50 - 74% of the time - May be physically or verbally inappropriate.  Problem Solving Problem solving assist level: Solves basic 25 - 49% of the time - needs direction more than half the time to initiate, plan or  complete simple activities  Memory Memory assist level: Recognizes or recalls less than 25% of the time/requires cueing greater than 75% of the time    Pain Pain  Assessment Pain Assessment: 0-10 Pain Score: 9 Pain Type: Acute pain Pain Location: stomach Pain Descriptors / Indicators: Aching/cramping  Pain Frequency: Constant Pain Intervention(s): RN administered medications, repositioning   Therapy/Group: Individual Therapy  Cicely Ortner 07/12/2015, 4:11 PM

## 2015-07-12 NOTE — Progress Notes (Signed)
[place] PHYSICAL MEDICINE & REHABILITATION     PROGRESS NOTE    Subjective/Complaints: In w/c. Just finished therapy. Seems to be in good spirits.   .  ROS limited by cognition and behavior.   Objective: Vital Signs: Blood pressure 131/73, pulse 81, temperature 97.8 F (36.6 C), temperature source Oral, resp. rate 12, height  (1.803 m), weight 73.029 kg (161 lb), SpO2 100 %. No results found.  Recent Labs  07/12/15 0801  WBC 5.4  HGB 12.3*  HCT 36.5*  PLT 314   No results for input(s): NA, K, CL, GLUCOSE, BUN, CREATININE, CALCIUM in the last 72 hours.  Invalid input(s): CO CBG (last 3)   Recent Labs  07/11/15 1649 07/11/15 2144 07/12/15 0636  GLUCAP 97 110* 154*    Wt Readings from Last 3 Encounters:  07/11/15 73.029 kg (161 lb)  06/13/15 89.6 kg (197 lb 8.5 oz)  06/08/15 91.627 kg (202 lb)    Physical Exam:  Constitutional: He appears well-developed and well-nourished.  HENT:  Head: Normocephalic and atraumatic.  Small incision on scalp with a couple of sutures and staples--+/- soft.  Eyes: Conjunctivae are normal. Pupils are equal, round, and reactive to light.  Neck: Neck supple.  Limited ROM laterally and flexion limited due to pain.  Cardiovascular: Normal rate and regular rhythm.  Respiratory: Effort normal and breath sounds normal. No stridor. No respiratory distress. He has no wheezes.  GI: Soft. Bowel sounds are normal. He exhibits no distension. There is no tenderness.  Musculoskeletal: He exhibits no edema.  No tenderness in extremeties  Neurological: He is alert.  A&Ox2.  name, hospital, [place]  Not oriented to situation Distracted/ lacks insight and awareness of deficits. Shies away from eye contact He was able to follow one and occasional two step motor commands.   Motor: B/L UE 4/5 B/L hip flexion 4-/5, ankle dorsi/plantar flexion 4+/5  Skin: Skin is warm and dry.  Psychiatric: Cognition and memory are impaired. He  expresses inappropriate judgment. He remains inattentive.     Assessment/Plan:  1. Cognitive, swallowing, and mobility deficits secondary to [facility] which require 3+ hours per day of interdisciplinary therapy in a comprehensive inpatient rehab setting. Physiatrist is providing close team supervision and 24 hour management of active medical problems listed below. Physiatrist and rehab team continue to assess barriers to discharge/monitor patient progress toward functional and medical goals.  Function:  Bathing Bathing position   Position: Shower  Bathing parts Body parts bathed by patient: Right arm, Left arm, Chest, Abdomen, Front perineal area, Right upper leg, Left upper leg, Buttocks, Right lower leg, Left lower leg Body parts bathed by helper: Back  Bathing assist Assist Level: Touching or steadying assistance(Pt > 75%)      Upper Body Dressing/Undressing Upper body dressing   What is the patient wearing?: Pull over shirt/dress     Pull over shirt/dress - Perfomed by patient: Thread/unthread right sleeve, Thread/unthread left sleeve, Put head through opening Pull over shirt/dress - Perfomed by helper: Pull shirt over trunk        Upper body assist Assist Level: Set up, Supervision or verbal cues   Set up : To obtain clothing/put away  Lower Body Dressing/Undressing Lower body dressing   What is the patient wearing?: Underwear, Pants, [employer], Shoes Underwear - Performed by patient: Thread/unthread right underwear leg, Thread/unthread left underwear leg, Pull underwear up/down Underwear - Performed by helper: Thread/unthread right underwear leg, Thread/unthread left underwear leg, Pull underwear up/down Pants- Performed  by patient: Thread/unthread right pants leg, Thread/unthread left pants leg, Pull pants up/down Pants- Performed by helper: Pull pants up/down Non-skid slipper socks- Performed by patient: Don/doff right sock, Don/doff left sock Non-skid slipper socks-  Performed by helper: Don/doff right sock, Don/doff left sock     Shoes - Performed by patient: Don/doff right shoe, Don/doff left shoe, Fasten right Shoes - Performed by helper: Fasten left       TED Hose - Performed by helper: Don/doff right TED hose, Don/doff left TED hose  Lower body assist Assist for lower body dressing: Touching or steadying assistance (Pt > 75%)      Toileting Toileting Toileting activity did not occur: N/A Toileting steps completed by patient: Adjust clothing prior to toileting, Performs perineal hygiene, Adjust clothing after toileting Toileting steps completed by helper: Adjust clothing prior to toileting, Adjust clothing after toileting, Performs perineal hygiene Toileting Assistive Devices: Grab bar or rail  Toileting assist Assist level: Touching or steadying assistance (Pt.75%)   Transfers Chair/bed transfer   Chair/bed transfer method: Stand pivot, Ambulatory Chair/bed transfer assist level: Touching or steadying assistance (Pt > 75%) Chair/bed transfer assistive device: Walker, Designer, fashion/clothing Ambulation activity did not occur: Refused   Max distance: 75 Assist level: Touching or steadying assistance (Pt > 75%)   Wheelchair   Type: Manual Max wheelchair distance: 150 Assist Level: Supervision or verbal cues  Cognition Comprehension Comprehension assist level: Understands basic 75 - 89% of the time/ requires cueing 10 - 24% of the time  Expression Expression assist level: Expresses basic needs/ideas: With extra time/assistive device  Social Interaction Social Interaction assist level: Interacts appropriately 50 - 74% of the time - May be physically or verbally inappropriate.  Problem Solving Problem solving assist level: Solves basic 25 - 49% of the time - needs direction more than half the time to initiate, plan or complete simple activities  Memory Memory assist level: Recognizes or recalls less than 25% of the time/requires  cueing greater than 75% of the time   Medical Problem List and Plan: 1. 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    2. LLE DVT: eliquis. No bleeding sequelae 3. Pain Management:  oxycodone prn for headaches and back/leg pain.  4. Mood: LCSW to follow for evaluation and support.  5. Neuropsych: This patient is not  capable of making decisions on his own behalf.  -continue to provide appropriate environment for sleep and communication  -environmental mod  - sleep chart  -increased ritalin to 15 mg bid to augment attention/processing 6. Skin/Wound Care: routine pressure relief measures.  7. Fluids/Electrolytes/Nutrition: intake inconsistent. Added nutritional supplements. Monitor for now.  .  -encourage PO 8. HTN: Monitor BP tid   -resumed low dose Cozaar.   -control had fair 9. DM type 2: Monitor BS ac/hs basis. Po intake has been variable due to lethargy and cognitive issues. Wife encouraged to bring pureed foods from home.   -continue to hold metformin and use SSI for now. sugars remain under control 10. Seizures disorder: Was on lamictal bid prior to admission. On keppra PO bid   11. ABLA: continue iron supplement..  12. Mild ileus:   two Senna S bid. Use enema prn.    13. Hypokalemia: Supplement to avoid recurrence of ileus.  14. Leucocytosis: ua neg, cx with multispecies  -remains afebrile  15. Chronic right knee pain/lower ext pain: improving   -  voltaren gel, prn ICE  16. PVCs: Negative cardiac work up 05/2015 17. Safety:    low bed---doesn't appear at risk for getting OOB  -room next to RN station  LOS (Days) 15 A FACE TO FACE EVALUATION WAS PERFORMED  Arlene Genova T 07/12/2015 8:48 AM

## 2015-07-12 NOTE — Progress Notes (Signed)
Pt complained of abdominal pain r/t constipation.  Administered dulcolax 10 mg suppository at 1020 with minimal results.  Pt still complained of constipation.  Administered FLEETS enema at 1152.  Pt had large, soft BM.  Pt stated relief of abdominal pain.  Pt returned to wheelchair awaiting therapies.  Continue with plan of care.

## 2015-07-13 ENCOUNTER — Inpatient Hospital Stay (HOSPITAL_COMMUNITY): Payer: BC Managed Care – PPO

## 2015-07-13 ENCOUNTER — Inpatient Hospital Stay (HOSPITAL_COMMUNITY): Payer: BC Managed Care – PPO | Admitting: Speech Pathology

## 2015-07-13 ENCOUNTER — Inpatient Hospital Stay (HOSPITAL_COMMUNITY): Payer: BC Managed Care – PPO | Admitting: *Deleted

## 2015-07-13 LAB — GLUCOSE, CAPILLARY
Glucose-Capillary: 110 mg/dL — ABNORMAL HIGH (ref 65–99)
Glucose-Capillary: 100 mg/dL — ABNORMAL HIGH (ref 65–99)
Glucose-Capillary: 92 mg/dL (ref 65–99)
Glucose-Capillary: 149 mg/dL — ABNORMAL HIGH (ref 65–99)

## 2015-07-13 MED ORDER — MEGESTROL ACETATE 400 MG/10ML PO SUSP
400.0000 mg | Freq: Two times a day (BID) | ORAL | Status: DC
  Administered 2015-07-13 – 2015-07-24 (×22): 400 mg via ORAL
  Filled 2015-07-13 (×23): qty 10

## 2015-07-13 NOTE — Progress Notes (Signed)
[place] PHYSICAL MEDICINE & REHABILITATION     PROGRESS NOTE    Subjective/Complaints: Up in chair. Just finished breakfast. Mild headache.   .  ROS remains limited by cognition and behavior.   Objective: Vital Signs: Blood pressure 120/66, pulse 89, temperature 98.8 F (37.1 C), temperature source Oral, resp. rate 18, height  (1.803 m), weight 73.029 kg (161 lb), SpO2 100 %. No results found.  Recent Labs  07/12/15 0801  WBC 5.4  HGB 12.3*  HCT 36.5*  PLT 314    Recent Labs  07/12/15 0801  NA 137  K 4.1  CL 102  GLUCOSE 156*  BUN 10  CREATININE 1.03  CALCIUM 9.4   CBG (last 3)   Recent Labs  07/12/15 1701 07/12/15 2200 07/13/15 0621  GLUCAP 99 114* 110*    Wt Readings from Last 3 Encounters:  07/11/15 73.029 kg (161 lb)  06/13/15 89.6 kg (197 lb 8.5 oz)  06/08/15 91.627 kg (202 lb)    Physical Exam:  Constitutional: He appears well-developed and well-nourished.  HENT:  Head: Normocephalic and atraumatic.  Scalp clean and dry Eyes: Conjunctivae are normal. Pupils are equal, round, and reactive to light.  Neck: Neck supple.     Cardiovascular: Normal rate and regular rhythm.  Respiratory: Effort normal and breath sounds normal. No stridor. No respiratory distress. He has no wheezes.  GI: Soft. Bowel sounds are normal. He exhibits no distension. There is no tenderness.  Musculoskeletal: He exhibits no edema.  No tenderness in extremeties  Neurological: He is alert.  A&Ox2.  name, hospital, [place], new date by looking at calendar.    Distracted but improving. Still lacks insight and awareness of deficits.    Motor: B/L UE 4/5 B/L hip flexion 4-/5, ankle dorsi/plantar flexion 4+/5  Skin: Skin is warm and dry.  Psychiatric: Cognition and memory are impaired. He expresses inappropriate judgment. He remains inattentive.     Assessment/Plan:  1. Cognitive, swallowing, and mobility deficits secondary to [facility] which require 3+ hours  per day of interdisciplinary therapy in a comprehensive inpatient rehab setting. Physiatrist is providing close team supervision and 24 hour management of active medical problems listed below. Physiatrist and rehab team continue to assess barriers to discharge/monitor patient progress toward functional and medical goals.  Function:  Bathing Bathing position   Position: Shower  Bathing parts Body parts bathed by patient: Right arm, Left arm, Chest, Abdomen, Front perineal area, Right upper leg, Left upper leg, Buttocks, Right lower leg, Left lower leg Body parts bathed by helper: Back  Bathing assist Assist Level: Touching or steadying assistance(Pt > 75%)      Upper Body Dressing/Undressing Upper body dressing   What is the patient wearing?: Pull over shirt/dress     Pull over shirt/dress - Perfomed by patient: Thread/unthread right sleeve, Thread/unthread left sleeve, Put head through opening Pull over shirt/dress - Perfomed by helper: Pull shirt over trunk        Upper body assist Assist Level: Set up, Supervision or verbal cues   Set up : To obtain clothing/put away  Lower Body Dressing/Undressing Lower body dressing   What is the patient wearing?: Underwear, Pants, [employer], Shoes Underwear - Performed by patient: Thread/unthread right underwear leg, Thread/unthread left underwear leg, Pull underwear up/down Underwear - Performed by helper: Thread/unthread right underwear leg, Thread/unthread left underwear leg, Pull underwear up/down Pants- Performed by patient: Thread/unthread right pants leg, Thread/unthread left pants leg, Pull pants up/down Pants- Performed by helper: Pull  pants up/down Non-skid slipper socks- Performed by patient: Don/doff right sock, Don/doff left sock Non-skid slipper socks- Performed by helper: Don/doff right sock, Don/doff left sock     Shoes - Performed by patient: Don/doff right shoe, Don/doff left shoe Shoes - Performed by helper: Fasten right,  Fasten left       TED Hose - Performed by helper: Don/doff right TED hose, Don/doff left TED hose  Lower body assist Assist for lower body dressing: Touching or steadying assistance (Pt > 75%)      Toileting Toileting Toileting activity did not occur: N/A Toileting steps completed by patient: Performs perineal hygiene Toileting steps completed by helper: Adjust clothing prior to toileting, Adjust clothing after toileting Toileting Assistive Devices: Grab bar or rail  Toileting assist Assist level: Touching or steadying assistance (Pt.75%), Supervision or verbal cues   Transfers Chair/bed transfer   Chair/bed transfer method: Ambulatory Chair/bed transfer assist level: Supervision or verbal cues Chair/bed transfer assistive device: Walker, Designer, fashion/clothing Ambulation activity did not occur: Refused   Max distance: 100 Assist level: Touching or steadying assistance (Pt > 75%)   Wheelchair   Type: Manual Max wheelchair distance: 150 Assist Level: Supervision or verbal cues  Cognition Comprehension Comprehension assist level: Understands basic 75 - 89% of the time/ requires cueing 10 - 24% of the time  Expression Expression assist level: Expresses basic needs/ideas: With extra time/assistive device  Social Interaction Social Interaction assist level: Interacts appropriately 50 - 74% of the time - May be physically or verbally inappropriate.  Problem Solving Problem solving assist level: Solves basic 25 - 49% of the time - needs direction more than half the time to initiate, plan or complete simple activities  Memory Memory assist level: Recognizes or recalls less than 25% of the time/requires cueing greater than 75% of the time   Medical Problem List and Plan: 1. 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    2. LLE DVT: eliquis. No bleeding  sequelae 3. Pain Management:  oxycodone prn for headaches and back/leg pain.  4. Mood: LCSW to follow for evaluation and support.  5. Neuropsych: This patient is not  capable of making decisions on his own behalf.  -continue to provide appropriate environment for sleep and communication  -environmental mod  - sleep chart  -increased ritalin to 15 mg bid to augment attention/processing 6. Skin/Wound Care: routine pressure relief measures.  7. Fluids/Electrolytes/Nutrition: intake inconsistent. Added nutritional supplements. Monitor for now.  .  -encourage PO 8. HTN: Monitor BP tid   -resumed low dose Cozaar.   -control had fair 9. DM type 2:   -continue to hold metformin and use SSI for now. sugars remain under control but intake is poor  -add megace for appetite although some of intake issues are cognitive based 10. Seizures disorder: Was on lamictal bid prior to admission. On keppra PO bid   11. ABLA: continue iron supplement..  12. Mild ileus:   two Senna S bid. Use enema prn.    13. Hypokalemia: Supplement to avoid recurrence of ileus.  14. Leucocytosis: ua neg, cx with multispecies  -remains afebrile  15. Chronic right knee pain/lower ext pain: improving   -voltaren gel, prn ICE  16. PVCs: Negative cardiac work up 05/2015 17. Safety:    low bed appears to be effective  -room next to RN station  LOS (Days) 16 A FACE TO  FACE EVALUATION WAS PERFORMED  Danyle Boening T 07/13/2015 9:20 AM

## 2015-07-13 NOTE — Progress Notes (Signed)
Speech Language Pathology Weekly Progress and Session Note  Patient Details  Name: Lupe Bonner MRN: [id number] Date of Birth: 25-Nov-1950  Beginning of progress report period: July 06, 2015 End of progress report period: July 13, 2015  Today's Date: 07/13/2015 SLP Individual Time: 0900-1000 SLP Individual Time Calculation (min): 60 min  Short Term Goals: Week 2: SLP Short Term Goal 1 (Week 2): Patient will orient to time, place and situation with Mod A question and verbal cues.  SLP Short Term Goal 1 - Progress (Week 2): Met SLP Short Term Goal 2 (Week 2): Patient will demonstrate sustained attention to functional tasks for 5 minutes with Mod A verbal cues for redirection. SLP Short Term Goal 2 - Progress (Week 2): Not met SLP Short Term Goal 3 (Week 2): Patient will identify 1 physical and 1 cognitive deficit with Min A verbal and question cues.  SLP Short Term Goal 3 - Progress (Week 2): Met SLP Short Term Goal 4 (Week 2): Patient will utilize call bell to request assistance with Max A multimodal cues in 25% of opportunities.  SLP Short Term Goal 4 - Progress (Week 2): Not met SLP Short Term Goal 5 (Week 2): Patient will consume trials of Dys. 2 textures and demonstrate efficent mastication with minimal oral residue with Min A verbal cues to self-monitor and correct oral residue over 2 consecutive sessions prior to upgrade.  SLP Short Term Goal 5 - Progress (Week 2): Met SLP Short Term Goal 6 (Week 2): Patient will consume current diet with minimal overt s/s of aspiration with Min A verbal cues for use of swallowing compensatory strategies.  SLP Short Term Goal 6 - Progress (Week 2): Not met    New Short Term Goals: Week 3: SLP Short Term Goal 1 (Week 3): Patient will orient to time, place and situation with Min A question and verbal cues.  SLP Short Term Goal 2 (Week 3): Patient will demonstrate sustained attention to functional tasks for 5 minutes with Mod A verbal cues for  redirection. SLP Short Term Goal 3 (Week 3): Patient will utilize call bell to request assistance with Max A multimodal cues in 25% of opportunities.  SLP Short Term Goal 4 (Week 3): Patient will demonstrate functional problem solving for basic and familiar tasks with Mod A verbal and visual cues.  SLP Short Term Goal 5 (Week 3): Patient will consume current diet with minimal overt s/s of aspiration with Min A verbal cues for use of swallowing compensatory strategies.  SLP Short Term Goal 6 (Week 3): Patient will recall 1 activity from a previous therapy session with Mod A question and verbal cues.   Weekly Progress Updates: Patient has made functional gains and has met 3 of 6 STG's this reporting period due to improved swallowing and cognitive function. Currently, patient is consuming Dys. 2 textures with thin liquids without overt s/s of aspiration but requires encouragement for PO intake and overall Mod-Max A verbal cues to self-monitor and correct oral residue.  Patient demonstrates improved orientation and requires overall Min-Mod A for orientation to place, time and situation and Min A question cues to identify at least 1 physical and 1 cognitive deficit.  The patient is out of the enclosure bed and is now in the high/low bed and requires Max-Total A for use of call bell to request assistance but is able to make his needs known when staff is present.  Patient continues to require overall Mod-Max A for sustained attention but will not  complete tasks with extra time.  Patient also requires overall Mod-Max A for functional problem solving and recall of new information. Patient continues to make gains, especially within the last few days, therefore, his LOS was extended to maximize cognitive and swallowing function and overall functional independence prior to discharge. Patient and family education is ongoing. Patient would benefit from continued skilled SLP intervention to maximize cognitive and swallowing  function and overall functional independence prior to discharge.     Intensity: Minumum of 1-2 x/day, 30 to 90 minutes Frequency: 3 to 5 out of 7 days Duration/Length of Stay: 07/24/15 Treatment/Interventions: Cognitive remediation/compensation;Cueing hierarchy;Functional tasks;Patient/family education;Therapeutic Activities;Internal/external aids;Environmental controls;Dysphagia/aspiration precaution training   Daily Session  Skilled Therapeutic Interventions: Skilled treatment session focused on cognitive goals. SLP facilitated session by providing Min A question cues for orientation to situation and year, however, patient was able to independently recall the month, date, place and city that he was currently in and patient was able to recall all orientation information with 100% accuracy after a 20 minute delay. SLP also facilitated session by providing Mod-Max A verbal cues for sustained attention to basic functional tasks for ~2 minute intervals. Patient's sustained attention was impacted by pain in which the patient was pre-medicated. Patient completed basic functional tasks in regards to basic mathematical problems, basic medication management task and basic written expression tasks with ~90% accuracy and overall Mod A verbal and visual cues. Patient was transferred back to bed at end of session and required Mod A verbal cues safety with task. Patient left supine in bed with all needs within reach and alarm on. Continue with current plan of care.      Function:   Cognition Comprehension Comprehension assist level: Understands basic 75 - 89% of the time/ requires cueing 10 - 24% of the time  Expression   Expression assist level: Expresses basic needs/ideas: With extra time/assistive device  Social Interaction Social Interaction assist level: Interacts appropriately 50 - 74% of the time - May be physically or verbally inappropriate.  Problem Solving Problem solving assist level: Solves basic  50 - 74% of the time/requires cueing 25 - 49% of the time  Memory Memory assist level: Recognizes or recalls 25 - 49% of the time/requires cueing 50 - 75% of the time   Pain Pain Assessment Pain Assessment: 0-10 Pain Score: 7  Faces Pain Scale: Hurts a little bit Pain Type: Acute pain Pain Location: Head Pain Orientation: Right Pain Descriptors / Indicators: Headache Pain Onset: On-going Patients Stated Pain Goal: 2 Pain Intervention(s): Patient premedicated, distraction  Therapy/Group: Individual Therapy  Johnette Teigen 07/13/2015, 11:44 AM

## 2015-07-13 NOTE — Progress Notes (Signed)
Physical Therapy Session Note  Patient Details  Name: Darryl [name] MRN: [id number] Date of Birth: [birth year]  Today's Date: 07/13/2015 PT Individual Time: 1100-1200 PT Individual Time Calculation (min): 60 min   Short Term Goals: Week 3:  PT Short Term Goal 1 (Week 3): Patient will ambulate 150 ft using RW with min A.  PT Short Term Goal 2 (Week 3): Patient will negotiate up/down 10 stairs using 1 rail with min A.  PT Short Term Goal 3 (Week 3): Patient will be oriented to time, place, and situation with mod verbal cues.   Skilled Therapeutic Interventions/Progress Updates:    Patient in bed and aroused with increased time.  Encouraged supine to sit and pt perseverated on completing tasks "in 20 min" until redirected.  Supervision to sit and pt engaged in cognitive activity of choosing next logical step to get ready to walk to gym.  He needed questioning cues for choosing to don shoes and able to don at edge of bed with increased time and multiple repeated efforts to get one shoe on, then cues needed again to complete task and don second shoe.  Stood with minguard A and ambulated with min A with RW (one episode of R lateral LOB min A and cues to recover with walker getting too far away and initial imbalance.  Ambulated 100' and 40' with RW and minguard to min A and verbal cues and facilitation for head position, decreased shoulder elevation and scapular retraction.  Patient performed sit to stand from elevated mat no UE support x 5 reps min A esp for balance in standing unsupported.  Patient in parallel bars performed steps ups to 6" step x 5.  Tandem gait with UE support min A.  Ant/post rocking with UE support in bars for limits of stability and ankle strength as well as back flexibility.  Patient ambulated to elevators slowly with RW min A then sat in w/c and was assisted to room due to fatigue.  Left up in recliner with daughter present and all needs in reach with quick release belt  donned.  Therapy Documentation Precautions:  Precautions Precautions: Fall Restrictions Weight Bearing Restrictions: No Pain: Pain Assessment Pain Assessment: 0-10 Pain Score: 2  Faces Pain Scale: Hurts whole lot Pain Type: Acute pain Pain Location: Knee Pain Orientation: Right Pain Descriptors / Indicators: Aching Pain Onset: With Activity Patients Stated Pain Goal: 2 Pain Intervention(s): RN made aware  See Function Navigator for Current Functional Status.   Therapy/Group: Individual Therapy  Elray Mcgregor  [place], [place] [phone] 07/13/2015  07/13/2015, 1:04 PM

## 2015-07-13 NOTE — Progress Notes (Signed)
Occupational Therapy Note  Patient Details  Name: Darryl [name] MRN: [id number] Date of Birth: [birth year]  Today's Date: 07/13/2015 OT Individual Time: 1400-1430 OT Individual Time Calculation (min): 30 min   Pt denied pain Individual therapy  Pt resting in bed upon arrival with family/friends present.  Pt sat EOB to don and fasten shoes before amb with RW to [employer].  Pt engaged in assembling structure with PVC pipes to correspond to provided diagram.  Pt initially unable to organize which pieces to use.  Once appropriate pipes selected and OT placed 1st two pipes in place, pt was able to complete task.  Pt oriented to city and year.  Pt required min questioning cues to orient to place and month. Pt oriented to situation.  Pt returned to bed in room with alarm set and all needs within reach.    [name] [facility] 07/13/2015, 3:00 PM

## 2015-07-13 NOTE — Progress Notes (Signed)
Occupational Therapy Session Note  Patient Details  Name: Darryl [name] MRN: [id number] Date of Birth: 1950-12-01  Today's Date: 07/13/2015 OT Individual Time: 0700-0800 OT Individual Time Calculation (min): 60 min    Short Term Goals: Week 3:  OT Short Term Goal 1 (Week 3): Pt will transfer to shower seat with min A and min verbal cues for sequencing OT Short Term Goal 2 (Week 3): Pt will complete LB dressing with supervision OT Short Term Goal 3 (Week 3): Pt will perform toileting tasks with supervison OT Short Term Goal 4 (Week 3): Pt will recall activities from previous day wil mod verbal cues and using compensatory strategies  Skilled Therapeutic Interventions/Progress Updates:    Pt resting in bed upon arrival.  Pt engaged in BADL retraining including bathing at shower level, dressing with sit<>stand from EOB, and self feeding.  Pt completed bathing and dressing tasks with steady A when standing.  Pt engaged in continuing self talk/conversation throughout session but continued to perform tasks if OT did not respond.  If OT engaged pt in conversation, pt unable to continue task.  Pt oriented to city, place, situation, and year. Pt requires more than a reasonable amount of time to complete tasks with multiple rest breaks.  Pt initiated all tasks when provided with supplies and clothing. Focus on activity tolerance, sit<>stand, standing balance, functional amb with RW, task initiation, sequencing, attention to task, cognitive remediation, and safety awareness.  Therapy Documentation Precautions:  Precautions Precautions: Fall Restrictions Weight Bearing Restrictions: No Pain: Pain Assessment Pain Assessment: Faces Pain Score: Asleep Faces Pain Scale: Hurts whole lot Pain Type: Acute pain Pain Location: Head Pain Orientation: Right Pain Descriptors / Indicators: Headache Pain Onset: On-going Pain Intervention(s): RN made aware  See Function Navigator for Current Functional  Status.   Therapy/Group: Individual Therapy  Rich Brave 07/13/2015, 8:36 AM

## 2015-07-14 ENCOUNTER — Inpatient Hospital Stay (HOSPITAL_COMMUNITY): Payer: BC Managed Care – PPO | Admitting: Speech Pathology

## 2015-07-14 LAB — GLUCOSE, CAPILLARY
Glucose-Capillary: 124 mg/dL — ABNORMAL HIGH (ref 65–99)
Glucose-Capillary: 114 mg/dL — ABNORMAL HIGH (ref 65–99)
Glucose-Capillary: 126 mg/dL — ABNORMAL HIGH (ref 65–99)
Glucose-Capillary: 135 mg/dL — ABNORMAL HIGH (ref 65–99)

## 2015-07-14 NOTE — Progress Notes (Signed)
[place] PHYSICAL MEDICINE & REHABILITATION     PROGRESS NOTE    Subjective/Complaints: Patient seen lying in bed this morning. He would like to know if his wife left to go to [place] or not.  ROS limited by cognition and behavior, but appears to deny CP, SOB, N/V/D.   Objective: Vital Signs: Blood pressure 129/74, pulse 85, temperature 98.1 F (36.7 C), temperature source Oral, resp. rate 18, height  (1.803 m), weight 73.029 kg (161 lb), SpO2 100 %. No results found.  Recent Labs  07/12/15 0801  WBC 5.4  HGB 12.3*  HCT 36.5*  PLT 314    Recent Labs  07/12/15 0801  NA 137  K 4.1  CL 102  GLUCOSE 156*  BUN 10  CREATININE 1.03  CALCIUM 9.4   CBG (last 3)   Recent Labs  07/13/15 1626 07/13/15 2158 07/14/15 0657  GLUCAP 92 149* 124*    Wt Readings from Last 3 Encounters:  07/11/15 73.029 kg (161 lb)  06/13/15 89.6 kg (197 lb 8.5 oz)  06/08/15 91.627 kg (202 lb)    Physical Exam:  Constitutional: He appears well-developed and well-nourished. NAD. Vital signs reviewed. HENT: Normocephalic. Scalp clean and dry Eyes: Conjunctivae and EOM are normal.  Cardiovascular: Normal rate and regular rhythm.  Respiratory: Effort normal and breath sounds normal. No stridor. No respiratory distress. He has no wheezes.  GI: Soft. Bowel sounds are normal. He exhibits no distension. There is no tenderness.  Musculoskeletal: He exhibits no edema or tenderness.  Neurological: He is alert.  A&Ox2.   Distracted  Lacks insight and awareness of deficits.    Motor: 4+/5 grossly throughout. Ataxia R UE. Skin: Skin is warm and dry.  Psychiatric: Cognition and memory are impaired. He expresses inappropriate judgment. He remains inattentive.   Assessment/Plan:  1. Cognitive, swallowing, and mobility deficits secondary to [facility] which require 3+ hours per day of interdisciplinary therapy in a comprehensive inpatient rehab setting. Physiatrist is providing close team  supervision and 24 hour management of active medical problems listed below. Physiatrist and rehab team continue to assess barriers to discharge/monitor patient progress toward functional and medical goals.  Function:  Bathing Bathing position   Position: Shower  Bathing parts Body parts bathed by patient: Right arm, Left arm, Chest, Abdomen, Front perineal area, Right upper leg, Left upper leg, Buttocks, Right lower leg, Left lower leg Body parts bathed by helper: Back  Bathing assist Assist Level: Touching or steadying assistance(Pt > 75%)      Upper Body Dressing/Undressing Upper body dressing   What is the patient wearing?: Pull over shirt/dress     Pull over shirt/dress - Perfomed by patient: Thread/unthread right sleeve, Thread/unthread left sleeve, Put head through opening Pull over shirt/dress - Perfomed by helper: Pull shirt over trunk        Upper body assist Assist Level: Set up, Supervision or verbal cues   Set up : To obtain clothing/put away  Lower Body Dressing/Undressing Lower body dressing   What is the patient wearing?: Underwear, Pants, [employer], Shoes Underwear - Performed by patient: Thread/unthread right underwear leg, Thread/unthread left underwear leg, Pull underwear up/down Underwear - Performed by helper: Thread/unthread right underwear leg, Thread/unthread left underwear leg, Pull underwear up/down Pants- Performed by patient: Thread/unthread right pants leg, Thread/unthread left pants leg, Pull pants up/down Pants- Performed by helper: Pull pants up/down Non-skid slipper socks- Performed by patient: Don/doff right sock, Don/doff left sock Non-skid slipper socks- Performed by helper: Don/doff right  sock, Don/doff left sock     Shoes - Performed by patient: Don/doff right shoe, Don/doff left shoe Shoes - Performed by helper: Fasten right, Fasten left       TED Hose - Performed by helper: Don/doff right TED hose, Don/doff left TED hose  Lower body  assist Assist for lower body dressing: Touching or steadying assistance (Pt > 75%)      Toileting Toileting Toileting activity did not occur: N/A Toileting steps completed by patient: Performs perineal hygiene Toileting steps completed by helper: Adjust clothing prior to toileting, Adjust clothing after toileting Toileting Assistive Devices: Grab bar or rail  Toileting assist Assist level: Touching or steadying assistance (Pt.75%), Supervision or verbal cues   Transfers Chair/bed transfer   Chair/bed transfer method: Ambulatory Chair/bed transfer assist level: Touching or steadying assistance (Pt > 75%) Chair/bed transfer assistive device: Armrests, Patent attorney Ambulation activity did not occur: Refused   Max distance: 100 Assist level: Touching or steadying assistance (Pt > 75%)   Wheelchair   Type: Manual Max wheelchair distance: 150 Assist Level: Supervision or verbal cues  Cognition Comprehension Comprehension assist level: Understands basic 75 - 89% of the time/ requires cueing 10 - 24% of the time  Expression Expression assist level: Expresses basic needs/ideas: With no assist  Social Interaction Social Interaction assist level: Interacts appropriately 50 - 74% of the time - May be physically or verbally inappropriate.  Problem Solving Problem solving assist level: Solves basic 50 - 74% of the time/requires cueing 25 - 49% of the time  Memory Memory assist level: Recognizes or recalls 25 - 49% of the time/requires cueing 50 - 75% of the time   Medical Problem List and Plan: 1. 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  2. LLE DVT: eliquis. No bleeding sequelae 3. Pain Management:  oxycodone prn for headaches and back/leg pain.  4. Mood: LCSW to follow for evaluation and support.  5. Neuropsych: This patient is not capable of making  decisions on his own behalf.  -continue to provide appropriate environment for sleep and communication  -environmental mod  -sleep chart  -increased ritalin to 15 mg bid to augment attention/processing 6. Skin/Wound Care: routine pressure relief measures.  7. Fluids/Electrolytes/Nutrition: intake inconsistent. Added nutritional supplements. Monitor for now.  .  -encourage PO 8. HTN: Monitor BP tid   -resumed low dose Cozaar.   WNL at present 9. DM type 2:   -continue to hold metformin and use SSI for now. sugars remain under control but intake is poor  -add megace for appetite although some of intake issues are cognitive based  Fasting CBG 124 this AM 10. Seizures disorder: Was on lamictal bid prior to admission. On keppra PO bid   11. ABLA: continue iron supplement..  12. Mild ileus:   two Senna S bid. Use enema prn.  13. Hypokalemia: Supplement to avoid recurrence of ileus.  14. Leucocytosis: ua neg, cx with multispecies  -remains afebrile  15. Chronic right knee pain/lower ext pain: improving   -voltaren gel, prn ICE 16. PVCs: Negative cardiac work up 05/2015 17. Safety:    low bed appears to be effective  -room next to RN station  LOS (Days) 17 A FACE TO FACE EVALUATION WAS PERFORMED  Darryl [name] Karis Juba 07/14/2015 11:22 AM

## 2015-07-14 NOTE — Progress Notes (Signed)
Speech Language Pathology Daily Session Note  Patient Details  Name: Darryl [name] MRN: [id number] Date of Birth: [birth year]  Today's Date: 07/14/2015 SLP Individual Time: 1225-1300 SLP Individual Time Calculation (min): 35 min  Short Term Goals: Week 3: SLP Short Term Goal 1 (Week 3): Patient will orient to time, place and situation with Min A question and verbal cues.  SLP Short Term Goal 2 (Week 3): Patient will demonstrate sustained attention to functional tasks for 5 minutes with Mod A verbal cues for redirection. SLP Short Term Goal 3 (Week 3): Patient will utilize call bell to request assistance with Max A multimodal cues in 25% of opportunities.  SLP Short Term Goal 4 (Week 3): Patient will demonstrate functional problem solving for basic and familiar tasks with Mod A verbal and visual cues.  SLP Short Term Goal 5 (Week 3): Patient will consume current diet with minimal overt s/s of aspiration with Min A verbal cues for use of swallowing compensatory strategies.  SLP Short Term Goal 6 (Week 3): Patient will recall 1 activity from a previous therapy session with Mod A question and verbal cues.   Skilled Therapeutic Interventions: Skilled treatment session focused on dysphagia goals. SLP facilitated session by providing Min A verbal cues for patient to self-monitor and correct left buccal pocketing with lunch meal of Dys. 2 textures with thin liquids. Patient demonstrates efficient mastication but tends to pocket food due to poor sustained attention and verbosity throughout meals. No overt s/s of aspiration were noted. Patient's family present towards end of session and asking appropriate question in regards to the patient's current swallowing and cognitive function. Patient left upright in wheelchair with all needs within reach and family present. Continue with current plan of care.    Function:  Eating Eating   Modified Consistency Diet: Yes Eating Assist Level: Supervision or verbal  cues;Set up assist for;Helper checks for pocketed food   Eating Set Up Assist For: Opening containers       Cognition Comprehension Comprehension assist level: Understands basic 75 - 89% of the time/ requires cueing 10 - 24% of the time  Expression   Expression assist level: Expresses basic needs/ideas: With no assist  Social Interaction Social Interaction assist level: Interacts appropriately 50 - 74% of the time - May be physically or verbally inappropriate.  Problem Solving Problem solving assist level: Solves basic 50 - 74% of the time/requires cueing 25 - 49% of the time  Memory Memory assist level: Recognizes or recalls 25 - 49% of the time/requires cueing 50 - 75% of the time    Pain Pain Assessment Pain Score: No reports of pain   Therapy/Group: Individual Therapy  Colen Eltzroth 07/14/2015, 1:47 PM

## 2015-07-15 ENCOUNTER — Inpatient Hospital Stay (HOSPITAL_COMMUNITY): Payer: BC Managed Care – PPO

## 2015-07-15 DIAGNOSIS — R296 Repeated falls: Secondary | ICD-10-CM | POA: Insufficient documentation

## 2015-07-15 DIAGNOSIS — W19XXXA Unspecified fall, initial encounter: Secondary | ICD-10-CM | POA: Insufficient documentation

## 2015-07-15 LAB — GLUCOSE, CAPILLARY
Glucose-Capillary: 110 mg/dL — ABNORMAL HIGH (ref 65–99)
Glucose-Capillary: 134 mg/dL — ABNORMAL HIGH (ref 65–99)
Glucose-Capillary: 99 mg/dL (ref 65–99)
Glucose-Capillary: 130 mg/dL — ABNORMAL HIGH (ref 65–99)

## 2015-07-15 MED ORDER — SORBITOL 70 % SOLN
30.0000 mL | Freq: Every day | Status: DC | PRN
  Administered 2015-07-15 – 2015-07-22 (×2): 30 mL via ORAL
  Filled 2015-07-15: qty 30

## 2015-07-15 NOTE — Progress Notes (Signed)
Bed alarm sounded once again and staff at the nursing station ran quickly into patient room and tech found patient on the floor on hands and knees at 0055. Patient reports pain to the left knee, 3 staff members assisted patient back into the bed. VS taken and stable. Patient stated he was trying to use the bathroom. Upon assessment no breaks in the skin noted and neuro assessment completed. No new changes since last assessment. MD notified at 0100 and advised to continue to monitor patient closely and more frequently. Pt wife called at 0105 and received the information well and had no further questions at this time. Continue to monitor patient per protocol. Ashaki Frosch, Phill Mutter

## 2015-07-15 NOTE — Progress Notes (Signed)
Bed alarm sounded and staff ran to room, found patient of floor on hands and knees ar 1947, complains of pain to the right rib cage and hip area. Upon assessment, no breaks in the skin noted or any bruising. Pt stated he was trying to get to the bathroom. Assisted patient back to bed and cleaned peri area and legs of urine. VS taken and stable. Reoriented patient on safety precautions and how to contact the nurse or nurse tech. Pt verbalized understanding. MD notified at 1953, advised to closely monitor patient for any changes. Patient daughter notified and seemed to recieve the information well and had no questions or concerns at this time. Will cont to monitor patient per protocol. Annamay Laymon, Phill Mutter

## 2015-07-15 NOTE — Progress Notes (Signed)
[place] PHYSICAL MEDICINE & REHABILITATION     PROGRESS NOTE    Subjective/Complaints: Patient seen this morning resting comfortably in bed. Upon awakening patient complains of right sided pain. Patient had falls 2 yesterday. The first time he had his right elbow and right side the second time he hit his right knee. No other trauma reported. Per nursing no head trauma.  ROS limited by cognition and behavior, but appears to deny CP, SOB, N/V/D.   Objective: Vital Signs: Blood pressure 108/69, pulse 75, temperature 99.1 F (37.3 C), temperature source Oral, resp. rate 18, height 5\' 11"  (1.803 m), weight 73.029 kg (161 lb), SpO2 100 %. No results found.  Recent Labs  07/12/15 0801  WBC 5.4  HGB 12.3*  HCT 36.5*  PLT 314    Recent Labs  07/12/15 0801  NA 137  K 4.1  CL 102  GLUCOSE 156*  BUN 10  CREATININE 1.03  CALCIUM 9.4   CBG (last 3)   Recent Labs  07/14/15 1648 07/14/15 2111 07/15/15 0655  GLUCAP 126* 135* 110*    Wt Readings from Last 3 Encounters:  07/11/15 73.029 kg (161 lb)  06/13/15 89.6 kg (197 lb 8.5 oz)  06/08/15 91.627 kg (202 lb)    Physical Exam:  Constitutional: He appears well-developed and well-nourished. NAD. Vital signs reviewed. HENT: Normocephalic. Scalp clean and dry Eyes: Conjunctivae and EOM are normal.  Cardiovascular: Normal rate and regular rhythm.  Respiratory: Effort normal and breath sounds normal. No stridor. No respiratory distress. He has no wheezes.  GI: Soft. Bowel sounds are normal. He exhibits no distension. There is no tenderness.  Musculoskeletal: + Tenderness right lateral thorax. No erythema, edema, warm noted on right elbow right-sided her right knee.  Neurological: He is alert.  A&Ox2.   Distracted  Lacks insight and awareness of deficits.    Motor: 4+/5 grossly throughout. Ataxia RUE. Skin: Skin is warm and dry.  Psychiatric: Cognition and memory are impaired. He expresses inappropriate judgment. He  remains inattentive.   Assessment/Plan:  1. Cognitive, swallowing, and mobility deficits secondary to [facility] which require 3+ hours per day of interdisciplinary therapy in a comprehensive inpatient rehab setting. Physiatrist is providing close team supervision and 24 hour management of active medical problems listed below. Physiatrist and rehab team continue to assess barriers to discharge/monitor patient progress toward functional and medical goals.  Function:  Bathing Bathing position   Position: Shower  Bathing parts Body parts bathed by patient: Right arm, Left arm, Chest, Abdomen, Front perineal area, Right upper leg, Left upper leg, Buttocks, Right lower leg, Left lower leg Body parts bathed by helper: Back  Bathing assist Assist Level: Touching or steadying assistance(Pt > 75%)      Upper Body Dressing/Undressing Upper body dressing   What is the patient wearing?: Pull over shirt/dress     Pull over shirt/dress - Perfomed by patient: Thread/unthread right sleeve, Thread/unthread left sleeve, Put head through opening Pull over shirt/dress - Perfomed by helper: Pull shirt over trunk        Upper body assist Assist Level: Set up, Supervision or verbal cues   Set up : To obtain clothing/put away  Lower Body Dressing/Undressing Lower body dressing   What is the patient wearing?: Underwear, Pants, [employer], Shoes Underwear - Performed by patient: Thread/unthread right underwear leg, Thread/unthread left underwear leg, Pull underwear up/down Underwear - Performed by helper: Thread/unthread right underwear leg, Thread/unthread left underwear leg, Pull underwear up/down Pants- Performed by patient: Thread/unthread  right pants leg, Thread/unthread left pants leg, Pull pants up/down Pants- Performed by helper: Pull pants up/down Non-skid slipper socks- Performed by patient: Don/doff right sock, Don/doff left sock Non-skid slipper socks- Performed by helper: Don/doff right sock,  Don/doff left sock     Shoes - Performed by patient: Don/doff right shoe, Don/doff left shoe Shoes - Performed by helper: Fasten right, Fasten left       TED Hose - Performed by helper: Don/doff right TED hose, Don/doff left TED hose  Lower body assist Assist for lower body dressing: Touching or steadying assistance (Pt > 75%)      Toileting Toileting Toileting activity did not occur: N/A Toileting steps completed by patient: Performs perineal hygiene Toileting steps completed by helper: Adjust clothing prior to toileting, Adjust clothing after toileting Toileting Assistive Devices: Grab bar or rail  Toileting assist Assist level: Touching or steadying assistance (Pt.75%), Supervision or verbal cues   Transfers Chair/bed transfer   Chair/bed transfer method: Ambulatory Chair/bed transfer assist level: Touching or steadying assistance (Pt > 75%) Chair/bed transfer assistive device: Armrests, Patent attorney Ambulation activity did not occur: Refused   Max distance: 100 Assist level: Touching or steadying assistance (Pt > 75%)   Wheelchair   Type: Manual Max wheelchair distance: 150 Assist Level: Supervision or verbal cues  Cognition Comprehension Comprehension assist level: Understands basic 75 - 89% of the time/ requires cueing 10 - 24% of the time  Expression Expression assist level: Expresses basic needs/ideas: With extra time/assistive device  Social Interaction Social Interaction assist level: Interacts appropriately 50 - 74% of the time - May be physically or verbally inappropriate.  Problem Solving Problem solving assist level: Solves basic 50 - 74% of the time/requires cueing 25 - 49% of the time  Memory Memory assist level: Recognizes or recalls 25 - 49% of the time/requires cueing 50 - 75% of the time   Medical Problem List and Plan: 1. Problems with mobility, swallow function and cognitive deficits secondary to [facility] with hydrocephalus due to  ruptured aneurysm  -continue CIR therapies.  2. LLE DVT: eliquis. No bleeding sequelae 3. Pain Management:  oxycodone prn for headaches and back/leg pain.  4. Mood: LCSW to follow for evaluation and support.  5. Neuropsych: This patient is not capable of making decisions on his own behalf.  -continue to provide appropriate environment for sleep and communication  -environmental mod  -sleep chart  -increased ritalin to 15 mg bid to augment attention/processing 6. Skin/Wound Care: routine pressure relief measures.  7. Fluids/Electrolytes/Nutrition: intake inconsistent. Added nutritional supplements. Monitor for now.  .  -encourage PO 8. HTN: Monitor BP tid   -resumed low dose Cozaar.   WNL at present 9. DM type 2:   -continue to hold metformin and use SSI for now. sugars remain under control but intake is poor  -add megace for appetite although some of intake issues are cognitive based  Fasting CBG 110 this AM 10. Seizures disorder: Was on lamictal bid prior to admission. On keppra PO bid   11. ABLA: continue iron supplement..  12. Mild ileus:   two Senna S bid. Use enema prn.  13. Hypokalemia: Supplement to avoid recurrence of ileus.  14. Leucocytosis: Resolved  ua neg, cx with multispecies  -remains afebrile  15. Chronic right knee pain/lower ext pain: improving   -voltaren gel, prn ICE 16. PVCs: Negative cardiac work up 05/2015 17. Safety:    low bed appears to be effective  -room  next to RN station  -Padding and support next to bed.  -Will only consider canopy bed if absolutely necessary.  LOS (Days) 18 A FACE TO FACE EVALUATION WAS PERFORMED  Ankit Karis Juba 07/15/2015 7:57 AM

## 2015-07-15 NOTE — Progress Notes (Signed)
Occupational Therapy Session Note  Patient Details  Name: Darryl [name] MRN: [id number] Date of Birth: [birth year]  Today's Date: 07/15/2015 OT Individual Time: 1115-1200 OT Individual Time Calculation (min): 45 min    Short Term Goals: Week 3:  OT Short Term Goal 1 (Week 3): Pt will transfer to shower seat with min A and min verbal cues for sequencing OT Short Term Goal 2 (Week 3): Pt will complete LB dressing with supervision OT Short Term Goal 3 (Week 3): Pt will perform toileting tasks with supervison OT Short Term Goal 4 (Week 3): Pt will recall activities from previous day wil mod verbal cues and using compensatory strategies  Skilled Therapeutic Interventions/Progress Updates:    Pt resting in bed upon arrival and agreeable to participating in therapy.  Pt recalled falling X 2 during the night/early morning and c/o pain in upper right rib cage (RN aware). Pt required more than a reasonable amount of time to complete all tasks this morning and often would sit in w/c without initiating tasks.  Pt stood at sink for 4 mins to wash face and bathe UB. Pt perseverated on his falls would "set him back" in his recovery.  Assured patient that he was still making progress.  Pt completed UB bathing and dressing tasks as well as grooming tasks.  Pt spent an unusual amount of time (4 mins) brushing his beard and hair.  Pt remained in w/c with QRB in place and rolled to RN station.  Focus on activity tolerance, sit<>stand, functional amb with RW, standing balance, orientation, and safety awreness.  Therapy Documentation Precautions:  Precautions Precautions: Fall Restrictions Weight Bearing Restrictions: No Pain: Pain Assessment Pain Assessment: Faces Faces Pain Scale: Hurts even more Pain Type: Acute pain Pain Location: Rib cage Pain Descriptors / Indicators: Aching Pain Onset: With Activity Pain Intervention(s): RN made aware;Repositioned See Function Navigator for Current Functional  Status.   Therapy/Group: Individual Therapy  Rich Brave 07/15/2015, 12:13 PM

## 2015-07-15 NOTE — Progress Notes (Signed)
Patient resting comfortably, and did not exhibit any impulsive behavior for the rest of the shift. Staff stayed at the bedside with the patient for the remainder of the shift to ensure safety. Placed call bell in patient hand and patient demonstrated correct use of call appropriately. Will cont to monitor. Darryl [name], Phill Mutter

## 2015-07-16 ENCOUNTER — Inpatient Hospital Stay (HOSPITAL_COMMUNITY): Payer: BC Managed Care – PPO | Admitting: Speech Pathology

## 2015-07-16 ENCOUNTER — Inpatient Hospital Stay (HOSPITAL_COMMUNITY): Payer: BC Managed Care – PPO

## 2015-07-16 ENCOUNTER — Inpatient Hospital Stay (HOSPITAL_COMMUNITY): Payer: BC Managed Care – PPO | Admitting: Physical Therapy

## 2015-07-16 LAB — GLUCOSE, CAPILLARY
Glucose-Capillary: 109 mg/dL — ABNORMAL HIGH (ref 65–99)
Glucose-Capillary: 104 mg/dL — ABNORMAL HIGH (ref 65–99)
Glucose-Capillary: 111 mg/dL — ABNORMAL HIGH (ref 65–99)
Glucose-Capillary: 161 mg/dL — ABNORMAL HIGH (ref 65–99)

## 2015-07-16 MED ORDER — LIDOCAINE 5 % EX PTCH
1.0000 | MEDICATED_PATCH | CUTANEOUS | Status: DC
  Administered 2015-07-17 – 2015-07-24 (×8): 1 via TRANSDERMAL
  Filled 2015-07-16 (×8): qty 1

## 2015-07-16 NOTE — Progress Notes (Signed)
Physical Therapy Session Note  Patient Details  Name: Darryl [name] MRN: [id number] Date of Birth: July 01, 1950  Today's Date: 07/16/2015 PT Individual Time: 0802-2336 PT Individual Time Calculation (min): 53 min   Short Term Goals: Week 3:  PT Short Term Goal 1 (Week 3): Patient will ambulate 150 ft using RW with min A.  PT Short Term Goal 2 (Week 3): Patient will negotiate up/down 10 stairs using 1 rail with min A.  PT Short Term Goal 3 (Week 3): Patient will be oriented to time, place, and situation with mod verbal cues.   Skilled Therapeutic Interventions/Progress Updates:    Pt received resting in w/c, c/o 9/10 pain in R rib cage but agreeable to therapy session.  Pt donned shoes with supervision and verbal cues and increased time due to pain.  PT propelled pt to therapy gym for pain management. RN and PA to assess pt for ongoing 9/10 pain in ribs.  PT instructed pt in x10 reps of AAROM thoracic rotation to R.  Sit<>stand x3 with steady assist and verbal cues for hand placement on RW.  Dynamic standing balance activity matching cards 2 trials of 5 minutes each with intermittent no UE support on RW.  Pt returned to room in w/c and stand/pivot back to bed with min assist.  PT instructed pt in rolling in bed, hooklying position using LEs to push onto side.  Pt left in bed with call bell in reach, bed alarm activated, and needs met.   Therapy Documentation Precautions:  Precautions Precautions: Fall Restrictions Weight Bearing Restrictions: No Pain: Pain Assessment Pain Assessment: 0-10 Pain Score: 9  Pain Type: Acute pain Pain Location: Rib cage Pain Orientation: Right Pain Descriptors / Indicators: Sore Pain Frequency: Constant Pain Onset: On-going Patients Stated Pain Goal: 2 Pain Intervention(s): Emotional support;RN made aware Multiple Pain Sites: No   See Function Navigator for Current Functional Status.   Therapy/Group: Individual Therapy  Earnest Conroy  Penven-Crew 07/16/2015, 4:24 PM

## 2015-07-16 NOTE — Progress Notes (Signed)
Speech Language Pathology Daily Session Note  Patient Details  Name: Darryl [name] MRN: [id number] Date of Birth: [birth year]  Today's Date: 07/16/2015 SLP Individual Time: 1000-1100 SLP Individual Time Calculation (min): 60 min  Short Term Goals: Week 3: SLP Short Term Goal 1 (Week 3): Patient will orient to time, place and situation with Min A question and verbal cues.  SLP Short Term Goal 2 (Week 3): Patient will demonstrate sustained attention to functional tasks for 5 minutes with Mod A verbal cues for redirection. SLP Short Term Goal 3 (Week 3): Patient will utilize call bell to request assistance with Max A multimodal cues in 25% of opportunities.  SLP Short Term Goal 4 (Week 3): Patient will demonstrate functional problem solving for basic and familiar tasks with Mod A verbal and visual cues.  SLP Short Term Goal 5 (Week 3): Patient will consume current diet with minimal overt s/s of aspiration with Min A verbal cues for use of swallowing compensatory strategies.  SLP Short Term Goal 6 (Week 3): Patient will recall 1 activity from a previous therapy session with Mod A question and verbal cues.   Skilled Therapeutic Interventions: Skilled treatment session focused on addressing cognition goals. SLP facilitated session by providing Mod assist verbal and visual cues to utilize external aides (not in patient's room) for orientation to time and situation.  Patient independently able to recall that he was in [place] at [facility].  SLP also facilitated session with Mod faded to Min verbal cues to sustain attention to topic of conversation (naming his children and where they live) for ~5 minute increments.  Patient benefited from phonemic-initial syllable verbal cues to retreive information.  Wife present for session which appeared to positively impact patient's motivation and performance today. SLP briefly educated wife on giving choices and sound cues to help patient find familiar  information.  Continue with current plan of care.   Function:  Cognition Comprehension Comprehension assist level: Understands basic 75 - 89% of the time/ requires cueing 10 - 24% of the time  Expression   Expression assist level: Expresses basic 90% of the time/requires cueing < 10% of the time.  Social Interaction Social Interaction assist level: Interacts appropriately 50 - 74% of the time - May be physically or verbally inappropriate.  Problem Solving Problem solving assist level: Solves basic 50 - 74% of the time/requires cueing 25 - 49% of the time  Memory Memory assist level: Recognizes or recalls 25 - 49% of the time/requires cueing 50 - 75% of the time    Pain Pain Assessment Pain Assessment: 0-10 Pain Score: 7  Pain Location: Rib cage Pain Orientation: Right Pain Intervention(s): Emotional support; monitored during session  Therapy/Group: Individual Therapy  Darryl [name]., CCC-SLP [phone]  Darryl [name] 07/16/2015, 6:33 PM

## 2015-07-16 NOTE — Progress Notes (Signed)
Nutrition Follow-up  DOCUMENTATION CODES:   Not applicable  INTERVENTION:  Continue 30 ml Prostat po TID, each supplement provides 100 kcal and 15 grams of protein.   Continue Glucerna Shake po BID, each supplement provides 220 kcal and 10 grams of protein.  Encourage adequate PO intake.   NUTRITION DIAGNOSIS:   Inadequate oral intake related to lethargy/confusion as evidenced by meal completion < 50%; improving  GOAL:   Patient will meet greater than or equal to 90% of their needs; progressing  MONITOR:   PO intake, Supplement acceptance, Diet advancement, Weigh65t trends, Labs, I & O's  REASON FOR ASSESSMENT:    (Supplements)    ASSESSMENT:   64 y.o. male with history fo HTN, DM type 2, PVCs, seizures who was seen in ED 06/10/15 with severe HA but CT head without acute changes. Late that day he had worsening of HA followed by N/V later with unresponsiveness. Patient intubated and sedated in ED. 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 Extubated  1/10  Pt is now on a dysphagia 2 diet with thin liquids. Meal completion has been varied from 10-100% with most recent meals at ~75%. Intake has been improving. Pt has been consuming his Prostat and consuming his Glucerna Shake most of the time. RD to continue with current orders to aid in caloric and protein needs.   Diet Order:  DIET DYS 2 Room service appropriate?: Yes; Fluid consistency:: Thin  Skin:  Reviewed, no issues  Last BM:  2/13  Height:   Ht Readings from Last 1 Encounters:  06/27/15  (1.803 m)    Weight:   Wt Readings from Last 1 Encounters:  07/11/15 161 lb (73.029 kg)    Ideal Body Weight:  78 kg  BMI:  Body mass index is 22.46 kg/(m^2).  Estimated Nutritional Needs:   Kcal:  2000-2200  Protein:  90-110 grams  Fluid:  2- 2.2 L/day  EDUCATION NEEDS:   No education needs identified at this time  Roslyn Smiling, MS, RD, LDN Pager #  [phone] After hours/ weekend pager # [phone]

## 2015-07-16 NOTE — Progress Notes (Signed)
Occupational Therapy Session Note  Patient Details  Name: Dallas Torok MRN: [id number] Date of Birth: Jul 19, 1950  Today's Date: 07/16/2015 OT Individual Time: 1478-2956 OT Individual Time Calculation (min): 75 min    Short Term Goals: Week 3:  OT Short Term Goal 1 (Week 3): Pt will transfer to shower seat with min A and min verbal cues for sequencing OT Short Term Goal 2 (Week 3): Pt will complete LB dressing with supervision OT Short Term Goal 3 (Week 3): Pt will perform toileting tasks with supervison OT Short Term Goal 4 (Week 3): Pt will recall activities from previous day wil mod verbal cues and using compensatory strategies  Skilled Therapeutic Interventions/Progress Updates:    Pt resting in bed upon arrival.  Pt c/o increased pain in right flank but willing to participate in therapy.  Pt engaged in BADL retraining including bathing at shower level and dressing with sit<>stand from w/c at sink.  Pt required more than a reasonable amount of time to complete tasks with rest breaks between segments of activities.  Pt required steady A and max verbal cues for sit<>stand from w/c and EOB this morning.  Focus on activity tolerance, sit<>stand, standing balance, functional amb with RW, and safety awareness.  Therapy Documentation Precautions:  Precautions Precautions: Fall Restrictions Weight Bearing Restrictions: No Pain: Pain Assessment Pain Assessment: Faces Faces Pain Scale: Hurts whole lot Pain Type: Acute pain Pain Location: Flank Pain Orientation: Right Pain Descriptors / Indicators: Sore Pain Frequency: Constant Pain Onset: On-going Pain Intervention(s): RN made aware;Repositioned Multiple Pain Sites: No  See Function Navigator for Current Functional Status.   Therapy/Group: Individual Therapy  Rich Brave 07/16/2015, 9:36 AM

## 2015-07-16 NOTE — Progress Notes (Signed)
Therapy and patient reporting right lower rib pain affecting mobility. Patient with tenderness to touch mid back--likely muscular in nature but will get CXR with recent falls.

## 2015-07-16 NOTE — Progress Notes (Signed)
[place] PHYSICAL MEDICINE & REHABILITATION     PROGRESS NOTE    Subjective/Complaints: Sore from falling yesterday---right "side" hurts. Up eating breakfast and doesn't appear to be in obvious pain ROS limited by cognition and behavior, but appears to deny CP, SOB, N/V/D.   Objective: Vital Signs: Blood pressure 108/64, pulse 74, temperature 97.5 F (36.4 C), temperature source Oral, resp. rate 17, height  (1.803 m), weight 73.029 kg (161 lb), SpO2 100 %. No results found. No results for input(s): WBC, HGB, HCT, PLT in the last 72 hours. No results for input(s): NA, K, CL, GLUCOSE, BUN, CREATININE, CALCIUM in the last 72 hours.  Invalid input(s): CO CBG (last 3)   Recent Labs  07/15/15 1652 07/15/15 2103 07/16/15 0640  GLUCAP 99 130* 109*    Wt Readings from Last 3 Encounters:  07/11/15 73.029 kg (161 lb)  06/13/15 89.6 kg (197 lb 8.5 oz)  06/08/15 91.627 kg (202 lb)    Physical Exam:  Constitutional: He appears well-developed and well-nourished. NAD. Vital signs reviewed. HENT: Normocephalic. Scalp clean and dry Eyes: Conjunctivae and EOM are normal.  Cardiovascular: Normal rate and regular rhythm.  Respiratory: Effort normal and breath sounds normal. No stridor. No respiratory distress. He has no wheezes.  GI: Soft. Bowel sounds are normal. He exhibits no distension. There is no tenderness.  Musculoskeletal: + Tenderness right lateral thorax. No erythema, edema, warm noted on right elbow right-sided her right knee.  Neurological: He is alert.  A&Ox2.   Less Distracted  Still Lacks insight and awareness of deficits.    Motor: 4+/5 grossly throughout. Ataxia RUE. Skin: Skin is warm and dry.  Psychiatric: Cognition and memory are impaired. He expresses inappropriate judgment. He remains inattentive.   Assessment/Plan:  1. Cognitive, swallowing, and mobility deficits secondary to [facility] which require 3+ hours per day of interdisciplinary therapy in a  comprehensive inpatient rehab setting. Physiatrist is providing close team supervision and 24 hour management of active medical problems listed below. Physiatrist and rehab team continue to assess barriers to discharge/monitor patient progress toward functional and medical goals.  Function:  Bathing Bathing position   Position: Wheelchair/chair at sink  Bathing parts Body parts bathed by patient: Right arm, Left arm, Chest, Abdomen (pt did not bathe LB) Body parts bathed by helper: Back  Bathing assist Assist Level: Touching or steadying assistance(Pt > 75%)      Upper Body Dressing/Undressing Upper body dressing   What is the patient wearing?: Pull over shirt/dress     Pull over shirt/dress - Perfomed by patient: Thread/unthread right sleeve, Thread/unthread left sleeve, Put head through opening Pull over shirt/dress - Perfomed by helper: Pull shirt over trunk        Upper body assist Assist Level: Set up, Supervision or verbal cues   Set up : To obtain clothing/put away  Lower Body Dressing/Undressing Lower body dressing Lower body dressing/undressing activity did not occur: Refused What is the patient wearing?: Underwear, Pants, [employer], Shoes Underwear - Performed by patient: Thread/unthread right underwear leg, Thread/unthread left underwear leg, Pull underwear up/down Underwear - Performed by helper: Thread/unthread right underwear leg, Thread/unthread left underwear leg, Pull underwear up/down Pants- Performed by patient: Thread/unthread right pants leg, Thread/unthread left pants leg, Pull pants up/down Pants- Performed by helper: Pull pants up/down Non-skid slipper socks- Performed by patient: Don/doff right sock, Don/doff left sock Non-skid slipper socks- Performed by helper: Don/doff right sock, Don/doff left sock     Shoes - Performed by patient:  Don/doff right shoe, Don/doff left shoe Shoes - Performed by helper: Fasten right, Fasten left       TED Hose -  Performed by helper: Don/doff right TED hose, Don/doff left TED hose  Lower body assist Assist for lower body dressing: Touching or steadying assistance (Pt > 75%)      Toileting Toileting Toileting activity did not occur: N/A Toileting steps completed by patient: Performs perineal hygiene Toileting steps completed by helper: Adjust clothing prior to toileting, Performs perineal hygiene Toileting Assistive Devices: Grab bar or rail  Toileting assist Assist level: Touching or steadying assistance (Pt.75%), Supervision or verbal cues   Transfers Chair/bed transfer   Chair/bed transfer method: Ambulatory Chair/bed transfer assist level: Touching or steadying assistance (Pt > 75%) Chair/bed transfer assistive device: Armrests, Patent attorney Ambulation activity did not occur: Refused   Max distance: 100 Assist level: Touching or steadying assistance (Pt > 75%)   Wheelchair   Type: Manual Max wheelchair distance: 150 Assist Level: Supervision or verbal cues  Cognition Comprehension Comprehension assist level: Understands basic 75 - 89% of the time/ requires cueing 10 - 24% of the time  Expression Expression assist level: Expresses complex ideas: With no assist  Social Interaction Social Interaction assist level: Interacts appropriately 50 - 74% of the time - May be physically or verbally inappropriate.  Problem Solving Problem solving assist level: Solves basic 50 - 74% of the time/requires cueing 25 - 49% of the time  Memory Memory assist level: Recognizes or recalls 25 - 49% of the time/requires cueing 50 - 75% of the time   Medical Problem List and Plan: 1. 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  2. LLE DVT: eliquis. No bleeding sequelae 3. Pain Management:  oxycodone prn for headaches and back/leg pain.  4. Mood: LCSW to follow for evaluation and  support.  5. Neuropsych: This patient is not capable of making decisions on his own behalf.  -continue to provide appropriate environment for sleep and communication  -environmental mod  -sleep chart  -increased ritalin to 15 mg bid to augment attention/processing 6. Skin/Wound Care: routine pressure relief measures.  7. Fluids/Electrolytes/Nutrition: intake inconsistent. Added nutritional supplements. Monitor for now.  .  -encourage PO 8. HTN: improved control   -resumed low dose Cozaar.     9. DM type 2:   -continue to hold metformin and use SSI for now. sugars remain under control but intake is poor  -added megace for appetite although some of intake issues are cognitive based  -sugars improved and consistently between 100-130 10. Seizures disorder: Was on lamictal bid prior to admission. On keppra PO bid   11. ABLA: continue iron supplement..  12. Mild ileus:   two Senna S bid. Use enema prn.  13. Hypokalemia: Supplement to avoid recurrence of ileus.  14. Leucocytosis: Resolved  ua neg, cx with multispecies  -remains afebrile  15. Chronic right knee pain/lower ext pain: improving   -voltaren gel, prn ICE 16. PVCs: Negative cardiac work up 05/2015 17. Safety:    low bed  ---  -room next to RN station  -Padding and support next to bed.  - no obvious sequelae from fall except for soft tissue injury  -needs closer observation from staff  LOS (Days) 19 A FACE TO FACE EVALUATION WAS PERFORMED  Kenzlei Runions T 07/16/2015 9:16 AM

## 2015-07-17 ENCOUNTER — Inpatient Hospital Stay (HOSPITAL_COMMUNITY): Payer: BC Managed Care – PPO | Admitting: Speech Pathology

## 2015-07-17 ENCOUNTER — Inpatient Hospital Stay (HOSPITAL_COMMUNITY): Payer: BC Managed Care – PPO | Admitting: Physical Therapy

## 2015-07-17 ENCOUNTER — Inpatient Hospital Stay (HOSPITAL_COMMUNITY): Payer: BC Managed Care – PPO

## 2015-07-17 LAB — GLUCOSE, CAPILLARY
Glucose-Capillary: 100 mg/dL — ABNORMAL HIGH (ref 65–99)
Glucose-Capillary: 133 mg/dL — ABNORMAL HIGH (ref 65–99)
Glucose-Capillary: 98 mg/dL (ref 65–99)
Glucose-Capillary: 142 mg/dL — ABNORMAL HIGH (ref 65–99)

## 2015-07-17 MED ORDER — RIVAROXABAN 20 MG PO TABS
20.0000 mg | ORAL_TABLET | Freq: Every day | ORAL | Status: DC
  Administered 2015-07-20 – 2015-07-23 (×4): 20 mg via ORAL
  Filled 2015-07-17 (×4): qty 1

## 2015-07-17 NOTE — Progress Notes (Signed)
Occupational Therapy Session Note  Patient Details  Name: Darryl [name] MRN: [id number] Date of Birth: [birth year]  Today's Date: 07/17/2015 OT Individual Time: 1030-1130 OT Individual Time Calculation (min): 60 min    Short Term Goals: Week 3:  OT Short Term Goal 1 (Week 3): Pt will transfer to shower seat with min A and min verbal cues for sequencing OT Short Term Goal 2 (Week 3): Pt will complete LB dressing with supervision OT Short Term Goal 3 (Week 3): Pt will perform toileting tasks with supervison OT Short Term Goal 4 (Week 3): Pt will recall activities from previous day wil mod verbal cues and using compensatory strategies  Skilled Therapeutic Interventions/Progress Updates:    Pt resting in w/c upon arrival and agreeable to BADL retraining including bathing at shower level and dressing with sit<>stand from EOB.  Pt required mod verbal cues to initiate tasks at beginning of shower but initiated all tasks once the first task commenced.  Pt completed bathing and dressing tasks with close supervision.  Pt attempted to bathe while standing but realized he tired very easily and sat on shower chair.  Pt required more than a reasonable amount of time to complete all tasks with multiple rest breaks.  Focus on continued cognitive remediation including task initiation, sequencing, orientation, and attention to task.  Therapy Documentation Precautions:  Precautions Precautions: Fall Restrictions Weight Bearing Restrictions: No Pain: Pain Assessment Pain Assessment: Faces Faces Pain Scale: Hurts even more Pain Type: Acute pain Pain Location: Rib cage Pain Orientation: Right Pain Descriptors / Indicators: Aching;Grimacing Pain Onset: With Activity Pain Intervention(s): Repositioned;Rest  See Function Navigator for Current Functional Status.   Therapy/Group: Individual Therapy  Rich Brave 07/17/2015, 12:03 PM

## 2015-07-17 NOTE — Progress Notes (Signed)
Physical Therapy Session Note  Patient Details  Name: Darryl [name] MRN: [id number] Date of Birth: 11-19-1950  Today's Date: 07/17/2015 PT Individual Time: 0905-1020 PT Individual Time Calculation (min): 75 min   Short Term Goals: Week 3:  PT Short Term Goal 1 (Week 3): Patient will ambulate 150 ft using RW with min A.  PT Short Term Goal 2 (Week 3): Patient will negotiate up/down 10 stairs using 1 rail with min A.  PT Short Term Goal 3 (Week 3): Patient will be oriented to time, place, and situation with mod verbal cues.   Skilled Therapeutic Interventions/Progress Updates:   Session focused on orientation, attention, safety awareness, and recall with wheelchair propulsion throughout rehab unit and on/off elevators with supervision, sit <> stand transfers using RW with min A after multiple attempts without assistance, gait training using RW 2 x 50 ft with min guard in controlled environment and x 75 ft with min guard in carpeted gift shop, attending to NuStep using BLE only at level 4 and stopping at 5 min mark without cues, and cognitive task of picking out, purchasing, and writing note on Valentine's Day card to wife. Patient required fluctuating min-mod A cues for orientation to holiday and situation, attending to task of getting card, and recalling purpose of card for wife throughout session. Patient wrote appropriate note to wife showing appreciation on holiday without cues. Patient left sitting in wheelchair with quick release belt on at RN station.   Therapy Documentation Precautions:  Precautions Precautions: Fall Restrictions Weight Bearing Restrictions: No Pain: Pain Assessment Pain Assessment: Faces Faces Pain Scale: Hurts even more Pain Type: Acute pain Pain Location: Rib cage Pain Orientation: Right Pain Descriptors / Indicators: Aching;Grimacing Pain Onset: With Activity Pain Intervention(s): Repositioned;Rest  See Function Navigator for Current Functional  Status.   Therapy/Group: Individual Therapy  Kerney Elbe 07/17/2015, 11:22 AM

## 2015-07-17 NOTE — Progress Notes (Signed)
[place] PHYSICAL MEDICINE & REHABILITATION     PROGRESS NOTE    Subjective/Complaints: Doesn't want to get out of bed to eat breakfast this morning. Right side still hurts---perseverates on it  ROS limited by cognition and behavior, but appears to deny CP, SOB, N/V/D.   Objective: Vital Signs: Blood pressure 117/63, pulse 70, temperature 98.2 F (36.8 C), temperature source Oral, resp. rate 18, height 5\' 11"  (1.803 m), weight 73.029 kg (161 lb), SpO2 100 %. Dg Chest 2 View  07/16/2015  CLINICAL DATA:  Right chest and rib pain EXAM: CHEST  2 VIEW COMPARISON:  06/11/2015 FINDINGS: Since the prior study, the patient has been extubated. NG tube also removed. Low lung volumes persist. Minor left basilar atelectasis. No focal pneumonia, collapse or consolidation. No edema, effusion or pneumothorax. Trachea midline. Aorta is atherosclerotic and tortuous. Normal heart size and vascularity. No acute osseous finding by plain radiography. IMPRESSION: Low volume exam with left basilar atelectasis. Electronically Signed   By: Judie Petit.  Shick M.D.   On: 07/16/2015 16:49   No results for input(s): WBC, HGB, HCT, PLT in the last 72 hours. No results for input(s): NA, K, CL, GLUCOSE, BUN, CREATININE, CALCIUM in the last 72 hours.  Invalid input(s): CO CBG (last 3)   Recent Labs  07/16/15 1724 07/16/15 2101 07/17/15 0641  GLUCAP 111* 161* 100*    Wt Readings from Last 3 Encounters:  07/11/15 73.029 kg (161 lb)  06/13/15 89.6 kg (197 lb 8.5 oz)  06/08/15 91.627 kg (202 lb)    Physical Exam:  Constitutional: He appears well-developed and well-nourished. NAD. Vital signs reviewed. HENT: Normocephalic. Scalp clean and dry Eyes: Conjunctivae and EOM are normal.  Cardiovascular: Normal rate and regular rhythm.  Respiratory: Effort normal and breath sounds normal. No stridor. No respiratory distress. He has no wheezes.  GI: Soft. Bowel sounds are normal. He exhibits no distension. There is no  tenderness.  Musculoskeletal: + Tenderness right lateral thorax. No erythema, edema, warm noted on right elbow right-sided her right knee.  Neurological: He is alert.  A&Ox2.   Less Distracted but slow to process and initiate still  Still Lacks insight and awareness of deficits.    Motor: 4+/5 grossly throughout. Ataxia RUE. Skin: Skin is warm and dry.  Psychiatric: Cognition and memory are impaired. He expresses inappropriate judgment. He remains inattentive.   Assessment/Plan:  1. Cognitive, swallowing, and mobility deficits secondary to [facility] which require 3+ hours per day of interdisciplinary therapy in a comprehensive inpatient rehab setting. Physiatrist is providing close team supervision and 24 hour management of active medical problems listed below. Physiatrist and rehab team continue to assess barriers to discharge/monitor patient progress toward functional and medical goals.  Function:  Bathing Bathing position   Position: Shower  Bathing parts Body parts bathed by patient: Right arm, Left arm, Chest, Abdomen, Front perineal area, Buttocks, Right upper leg, Left upper leg, Right lower leg, Left lower leg Body parts bathed by helper: Back  Bathing assist Assist Level: Touching or steadying assistance(Pt > 75%)      Upper Body Dressing/Undressing Upper body dressing   What is the patient wearing?: Pull over shirt/dress     Pull over shirt/dress - Perfomed by patient: Thread/unthread right sleeve, Thread/unthread left sleeve, Put head through opening Pull over shirt/dress - Perfomed by helper: Pull shirt over trunk        Upper body assist Assist Level: Set up, Supervision or verbal cues   Set up : To obtain  clothing/put away  Lower Body Dressing/Undressing Lower body dressing Lower body dressing/undressing activity did not occur: Refused What is the patient wearing?: Underwear, Pants, Non-skid slipper socks, Ted Hose Underwear - Performed by patient: Thread/unthread  right underwear leg, Thread/unthread left underwear leg, Pull underwear up/down Underwear - Performed by helper: Thread/unthread right underwear leg, Thread/unthread left underwear leg, Pull underwear up/down Pants- Performed by patient: Thread/unthread right pants leg, Thread/unthread left pants leg, Pull pants up/down Pants- Performed by helper: Pull pants up/down Non-skid slipper socks- Performed by patient: Don/doff right sock, Don/doff left sock Non-skid slipper socks- Performed by helper: Don/doff right sock, Don/doff left sock     Shoes - Performed by patient: Don/doff right shoe, Don/doff left shoe Shoes - Performed by helper: Fasten right, Fasten left       TED Hose - Performed by helper: Don/doff right TED hose, Don/doff left TED hose  Lower body assist Assist for lower body dressing: Touching or steadying assistance (Pt > 75%)      Toileting Toileting Toileting activity did not occur: N/A Toileting steps completed by patient: Adjust clothing prior to toileting, Performs perineal hygiene Toileting steps completed by helper: Adjust clothing after toileting Toileting Assistive Devices: Grab bar or rail  Toileting assist Assist level: Touching or steadying assistance (Pt.75%)   Transfers Chair/bed transfer   Chair/bed transfer method: Stand pivot Chair/bed transfer assist level: Touching or steadying assistance (Pt > 75%) Chair/bed transfer assistive device: Armrests, Patent attorney Ambulation activity did not occur: Refused   Max distance: 100 Assist level: Touching or steadying assistance (Pt > 75%)   Wheelchair   Type: Manual Max wheelchair distance: 150 Assist Level: Supervision or verbal cues  Cognition Comprehension Comprehension assist level: Understands basic 75 - 89% of the time/ requires cueing 10 - 24% of the time  Expression Expression assist level: Expresses basic 90% of the time/requires cueing < 10% of the time.  Social Interaction  Social Interaction assist level: Interacts appropriately 50 - 74% of the time - May be physically or verbally inappropriate.  Problem Solving Problem solving assist level: Solves basic 50 - 74% of the time/requires cueing 25 - 49% of the time  Memory Memory assist level: Recognizes or recalls 25 - 49% of the time/requires cueing 50 - 75% of the time   Medical Problem List and Plan: 1. 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  2. LLE DVT: eliquis. No bleeding sequelae 3. Pain Management:  oxycodone prn for headaches and back/leg pain.  4. Mood: LCSW to follow for evaluation and support.  5. Neuropsych: This patient is not capable of making decisions on his own behalf.  -continue to provide appropriate environment for sleep and communication  -environmental mod  -continue sleep chart  -increased ritalin to 15 mg bid to augment attention/processing 6. Skin/Wound Care: routine pressure relief measures.  7. Fluids/Electrolytes/Nutrition: intake inconsistent. Added nutritional supplements. Monitor for now.  .  -encourage PO 8. HTN: improved control   -resumed low dose Cozaar with good control    9. DM type 2:   -continue to hold metformin and use SSI for now. sugars remain under control but intake is poor  -  Megace helpful for appetite    -sugars improved and consistently between 100-130 10. Seizures disorder: Was on lamictal bid prior to admission. On keppra PO bid currently 11. ABLA: continue iron supplement..  12. Mild ileus:  two Senna S bid. Use enema prn.  13. Hypokalemia: Supplement to avoid recurrence of ileus.  14. Leukocytosis: Resolved  ua neg, cx with multispecies  -remains afebrile  15. Chronic right knee pain/lower ext pain: improving   -voltaren gel, prn ICE 16. PVCs: Negative cardiac work up 05/2015 17. Safety:  continue  low bed    -room next to RN  station  -Padding and support next to bed.  - no obvious sequelae from falls except for soft tissue injury  -closer observation from staff  LOS (Days) 20 A FACE TO FACE EVALUATION WAS PERFORMED  SWARTZ,ZACHARY T 07/17/2015 9:19 AM

## 2015-07-17 NOTE — Patient Care Conference (Addendum)
Inpatient RehabilitationTeam Conference and Plan of Care Update Date: 07/17/2015   Time: 2:30 PM    Patient Name: Darryl [name]      Medical Record Number: [id number]  Date of Birth: February 01, 1951 Sex: Male         Room/Bed: 4W14C/4W14C-01 Payor Info: Payor: [employer] / Plan: [facility] PPO / Product Type: *No Product type* /    Admitting Diagnosis: SAH with cognitive deficits  Admit Date/Time:  06/27/2015  4:13 PM Admission Comments: No comment available   Primary Diagnosis:  SAH (subarachnoid hemorrhage) (HCC) Principal Problem: SAH (subarachnoid hemorrhage) (HCC)  Patient Active Problem List   Diagnosis Date Noted  . Falls   . Acute deep vein thrombosis (DVT) of left femoral vein (HCC) 07/01/2015  . Pain   . Poor fluid intake   . Essential hypertension   . Type 2 diabetes mellitus with complication, without long-term current use of insulin (HCC)   . Disorientation   . Convulsions (HCC)   . Acute blood loss anemia   . Hypokalemia   . Ileus (HCC)   . Leukocytosis   . Right knee pain   . Cognitive safety issue   . Impulsiveness   . PVC (premature ventricular contraction)   . Abdominal discomfort   . SAH (subarachnoid hemorrhage) (HCC) 06/11/2015  . Emesis   . Subarachnoid hemorrhage (HCC) 06/10/2015  . Hypertension   . Diabetes mellitus without complication [facility])     Expected Discharge Date: Expected Discharge Date: 07/24/15  Team Members Present: Physician leading conference: Dr. Faith Rogue Social Worker Present: Amada Jupiter, LCSW Nurse Present: Kennon Portela, RN PT Present: Bayard Hugger, PT OT Present: Ardis Rowan, COTA;Johnsie Cancel, OT SLP Present: Feliberto Gottron, SLP PPS Coordinator present : Tora Duck, RN, CRRN     Current Status/Progress Goal Weekly Team Focus  Medical   slow cognitive improvement. had falls over weekend without major sequelae  improved safety awareness and balance  see prior   Bowel/Bladder   continent of bowel: LBM  07/16/15, occasionally incontinent of urine  continent of bowel and bladdwer with mod assist  continue to offer toileting q 2hr while awake   Swallow/Nutrition/ Hydration   Dys. 2 textures with thin liquids, Min-Mod A verbal cues for use of swallowing compensatory strategies   Supervision with least restrictive diet  trials of upgraded textures, family education    ADL's   BADLs-supervision; functional transfers-min A; mod verbal cues for task initiation; min/mod verbal cues for orientation  supervision overall  activity toleracne, BADL retraining, cognitive remediation, family education   Mobility   supervision-min A sit <> stand transfers, gait using RW household distances min guard, stairs with supervision-min guard, supervision wheelchair mobility  supervision overall  orientation, initation, awareness, safety, functional mobility, activity tolerance, standing balance, pt/family education   Communication             Safety/Cognition/ Behavioral Observations  Mod A  Min A  orientation, attention, problem solving, awareness    Pain   constant headache to anterior head, 5-10mg  Oxy IR q 4hr prn  less than or equal to 4 on a scale of 0-10  assess pain q 4hr and prn   Skin   CDI  no new skin injury/breakdown while on rehab  assess skin q shift and prn      *See Care Plan and progress notes for long and short-term goals.  Barriers to Discharge: decreased balance lack of safety awareness, delayed processing    Possible  Resolutions to Barriers:  close supervision, continued cognitive remediation    Discharge Planning/Teaching Needs:  Plan is for pt to d/c home with wife.  Plan to have meeting with wife and children (via speaker phone) tomorrow if can be arranged and discuss d/c support needs.      Team Discussion:  Had a fall over the weekend - no injuries beyond bruising.  Min assist - supervision with mobility.  Must have very close supervision at home and may benefit from using gait  belt when seated.  Pt more perseverative since fall.  SW to follow up with wife and family tomorrow to determine d/c plan and wife's comfort level with providing this close assistance upon d/c.  Revisions to Treatment Plan:  None   Continued Need for Acute Rehabilitation Level of Care: The patient requires daily medical management by a physician with specialized training in physical medicine and rehabilitation for the following conditions: Daily direction of a multidisciplinary physical rehabilitation program to ensure safe treatment while eliciting the highest outcome that is of practical value to the patient.: Yes Daily medical management of patient stability for increased activity during participation in an intensive rehabilitation regime.: Yes Daily analysis of laboratory values and/or radiology reports with any subsequent need for medication adjustment of medical intervention for : Neurological problems;Blood pressure problems;Diabetes problems  Darryl [name] 07/17/2015, 3:59 PM

## 2015-07-17 NOTE — Progress Notes (Signed)
Speech Language Pathology Daily Session Note  Patient Details  Name: Darryl [name] MRN: [id number] Date of Birth: September 04, 1950  Today's Date: 07/17/2015 SLP Individual Time: 1330-1430 SLP Individual Time Calculation (min): 60 min  Short Term Goals: Week 3: SLP Short Term Goal 1 (Week 3): Patient will orient to time, place and situation with Min A question and verbal cues.  SLP Short Term Goal 2 (Week 3): Patient will demonstrate sustained attention to functional tasks for 5 minutes with Mod A verbal cues for redirection. SLP Short Term Goal 3 (Week 3): Patient will utilize call bell to request assistance with Max A multimodal cues in 25% of opportunities.  SLP Short Term Goal 4 (Week 3): Patient will demonstrate functional problem solving for basic and familiar tasks with Mod A verbal and visual cues.  SLP Short Term Goal 5 (Week 3): Patient will consume current diet with minimal overt s/s of aspiration with Min A verbal cues for use of swallowing compensatory strategies.  SLP Short Term Goal 6 (Week 3): Patient will recall 1 activity from a previous therapy session with Mod A question and verbal cues.   Skilled Therapeutic Interventions: Skilled treatment session focused on cognitive and dysphagia goals. SLP facilitated session by providing extra time and Mod A verbal cues for safety with transfer from the bed to the wheelchair due to pain. Patient becoming verbally frustrated with clinician when attempting to help with transfer but was easily redirected. SLP also facilitated session by providing Min A verbal cues for use of swallowing compensatory strategies in regards to clearing his oral cavity with lunch meal of Dys. 2 textures and thin liquids. Patient without overt s/s of aspiration and required Min A verbal cues for redirection to self-feeding in a mildly distracting environment for ~10 minutes. Patient independently requested to use the bathroom and required Min A verbal cues for safety with  transfer. Patient left supine in bed with alarm on and all needs within reach. Continue with current plan of care.    Function:  Eating Eating   Modified Consistency Diet: Yes Eating Assist Level: More than reasonable amount of time;Set up assist for;Supervision or verbal cues       Cognition Comprehension Comprehension assist level: Understands basic 75 - 89% of the time/ requires cueing 10 - 24% of the time  Expression   Expression assist level: Expresses basic 90% of the time/requires cueing < 10% of the time.  Social Interaction Social Interaction assist level: Interacts appropriately 50 - 74% of the time - May be physically or verbally inappropriate.  Problem Solving Problem solving assist level: Solves basic 50 - 74% of the time/requires cueing 25 - 49% of the time  Memory Memory assist level: Recognizes or recalls 25 - 49% of the time/requires cueing 50 - 75% of the time    Pain Discomfort in ribs with movement, RN made aware and patient repositioned and pre-medicated   Therapy/Group: Individual Therapy  Darryl [name] 07/17/2015, 4:41 PM

## 2015-07-18 ENCOUNTER — Inpatient Hospital Stay (HOSPITAL_COMMUNITY): Payer: BC Managed Care – PPO | Admitting: Speech Pathology

## 2015-07-18 ENCOUNTER — Inpatient Hospital Stay (HOSPITAL_COMMUNITY): Payer: BC Managed Care – PPO

## 2015-07-18 ENCOUNTER — Inpatient Hospital Stay (HOSPITAL_COMMUNITY): Payer: BC Managed Care – PPO | Admitting: Physical Therapy

## 2015-07-18 LAB — GLUCOSE, CAPILLARY
Glucose-Capillary: 121 mg/dL — ABNORMAL HIGH (ref 65–99)
Glucose-Capillary: 92 mg/dL (ref 65–99)
Glucose-Capillary: 124 mg/dL — ABNORMAL HIGH (ref 65–99)
Glucose-Capillary: 133 mg/dL — ABNORMAL HIGH (ref 65–99)

## 2015-07-18 NOTE — Progress Notes (Signed)
[place] PHYSICAL MEDICINE & REHABILITATION     PROGRESS NOTE    Subjective/Complaints: Still perseverating on back/side pain. Occasional headache too  ROS limited by cognition and behavior, but appears to deny CP, SOB, N/V/D.   Objective: Vital Signs: Blood pressure 95/68, pulse 71, temperature 98.9 F (37.2 C), temperature source Oral, resp. rate 18, height  (1.803 m), weight 73.029 kg (161 lb), SpO2 98 %. Dg Chest 2 View  07/16/2015  CLINICAL DATA:  Right chest and rib pain EXAM: CHEST  2 VIEW COMPARISON:  06/11/2015 FINDINGS: Since the prior study, the patient has been extubated. NG tube also removed. Low lung volumes persist. Minor left basilar atelectasis. No focal pneumonia, collapse or consolidation. No edema, effusion or pneumothorax. Trachea midline. Aorta is atherosclerotic and tortuous. Normal heart size and vascularity. No acute osseous finding by plain radiography. IMPRESSION: Low volume exam with left basilar atelectasis. Electronically Signed   By: Judie Petit.  Shick M.D.   On: 07/16/2015 16:49   No results for input(s): WBC, HGB, HCT, PLT in the last 72 hours. No results for input(s): NA, K, CL, GLUCOSE, BUN, CREATININE, CALCIUM in the last 72 hours.  Invalid input(s): CO CBG (last 3)   Recent Labs  07/17/15 1659 07/17/15 2048 07/18/15 0652  GLUCAP 98 142* 121*    Wt Readings from Last 3 Encounters:  07/11/15 73.029 kg (161 lb)  06/13/15 89.6 kg (197 lb 8.5 oz)  06/08/15 91.627 kg (202 lb)    Physical Exam:  Constitutional: He appears well-developed and well-nourished. NAD. Vital signs reviewed. HENT: Normocephalic. Scalp clean and dry Eyes: Conjunctivae and EOM are normal.  Cardiovascular: Normal rate and regular rhythm.  Respiratory: Effort normal and breath sounds normal. No stridor. No respiratory distress. He has no wheezes.  GI: Soft. Bowel sounds are normal. He exhibits no distension. There is no tenderness.  Musculoskeletal: + Tenderness right  lateral thorax with morvement and palpation. No bruising. No erythema, edema, warm Neurological: He is alert.  A&Ox2.   Less Distracted but slow to process and initiate still  Still Lacks insight and awareness of deficits.    Motor: 4+/5 grossly throughout. Ataxia RUE. Skin: Skin is warm and dry.  Psychiatric: Cognition and memory are impaired. He expresses inappropriate judgment. He remains inattentive.   Assessment/Plan:  1. Cognitive, swallowing, and mobility deficits secondary to [facility] which require 3+ hours per day of interdisciplinary therapy in a comprehensive inpatient rehab setting. Physiatrist is providing close team supervision and 24 hour management of active medical problems listed below. Physiatrist and rehab team continue to assess barriers to discharge/monitor patient progress toward functional and medical goals.  Function:  Bathing Bathing position   Position: Shower  Bathing parts Body parts bathed by patient: Right arm, Left arm, Chest, Abdomen, Front perineal area, Buttocks, Right upper leg, Left upper leg, Right lower leg, Left lower leg Body parts bathed by helper: Back  Bathing assist Assist Level: Touching or steadying assistance(Pt > 75%)      Upper Body Dressing/Undressing Upper body dressing   What is the patient wearing?: Pull over shirt/dress     Pull over shirt/dress - Perfomed by patient: Thread/unthread right sleeve, Thread/unthread left sleeve, Put head through opening Pull over shirt/dress - Perfomed by helper: Pull shirt over trunk        Upper body assist Assist Level: Set up, Supervision or verbal cues   Set up : To obtain clothing/put away  Lower Body Dressing/Undressing Lower body dressing Lower body dressing/undressing activity  did not occur: Refused What is the patient wearing?: Pants, [employer], Shoes Underwear - Performed by patient: Thread/unthread right underwear leg, Thread/unthread left underwear leg, Pull underwear  up/down Underwear - Performed by helper: Thread/unthread right underwear leg, Thread/unthread left underwear leg, Pull underwear up/down Pants- Performed by patient: Thread/unthread right pants leg, Thread/unthread left pants leg, Pull pants up/down Pants- Performed by helper: Pull pants up/down Non-skid slipper socks- Performed by patient: Don/doff right sock, Don/doff left sock Non-skid slipper socks- Performed by helper: Don/doff right sock, Don/doff left sock     Shoes - Performed by patient: Don/doff right shoe, Don/doff left shoe, Fasten right, Fasten left Shoes - Performed by helper: Fasten right, Fasten left       TED Hose - Performed by helper: Don/doff right TED hose, Don/doff left TED hose  Lower body assist Assist for lower body dressing: Touching or steadying assistance (Pt > 75%)      Toileting Toileting Toileting activity did not occur: N/A Toileting steps completed by patient: Adjust clothing prior to toileting, Performs perineal hygiene, Adjust clothing after toileting Toileting steps completed by helper: Adjust clothing after toileting Toileting Assistive Devices: Grab bar or rail  Toileting assist Assist level: Touching or steadying assistance (Pt.75%)   Transfers Chair/bed transfer   Chair/bed transfer method: Ambulatory Chair/bed transfer assist level: Touching or steadying assistance (Pt > 75%) Chair/bed transfer assistive device: Armrests, Patent attorney Ambulation activity did not occur: Refused   Max distance: 50 Assist level: Touching or steadying assistance (Pt > 75%)   Wheelchair   Type: Manual Max wheelchair distance: 250 Assist Level: Supervision or verbal cues  Cognition Comprehension Comprehension assist level: Understands basic 75 - 89% of the time/ requires cueing 10 - 24% of the time  Expression Expression assist level: Expresses basic 90% of the time/requires cueing < 10% of the time.  Social Interaction Social  Interaction assist level: Interacts appropriately 50 - 74% of the time - May be physically or verbally inappropriate.  Problem Solving Problem solving assist level: Solves basic 50 - 74% of the time/requires cueing 25 - 49% of the time  Memory Memory assist level: Recognizes or recalls 25 - 49% of the time/requires cueing 50 - 75% of the time   Medical Problem List and Plan: 1. 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   -team continues to work on Water engineer, education 2. LLE DVT: eliquis. No bleeding sequelae 3. Pain Management:  oxycodone prn for headaches and back/leg pain.  4. Mood: LCSW to follow for evaluation and support.  5. Neuropsych: This patient is not capable of making decisions on his own behalf.  -continue to provide appropriate environment for sleep and communication  -environmental mod  -continue sleep chart  -increased ritalin to 15 mg bid to augment attention/processing 6. Skin/Wound Care: routine pressure relief measures.  7. Fluids/Electrolytes/Nutrition: intake inconsistent. Added nutritional supplements. Monitor for now.  .  -encourage PO 8. HTN: improved control   -resumed low dose Cozaar with good control    9. DM type 2:   -continue to hold metformin and use SSI for now. sugars remain under control but intake is poor  -  Megace helpful for appetite    -sugars generally between 100-130 10. Seizures disorder: Was on lamictal bid prior to admission. On keppra PO bid currently 11. ABLA: continue iron supplement..  12. Mild ileus:   two  Senna S bid. Use enema prn.  13. Hypokalemia: Supplement to avoid recurrence of ileus.  14. Leukocytosis: Resolved  ua neg, cx with multispecies  -remains afebrile  15. Chronic right knee pain/lower ext pain: improving   -voltaren gel, prn ICE 16. PVCs: Negative cardiac work up 05/2015 17. Safety:  continue   low bed    -room next to RN station  -Padding and support next to bed.  - no obvious sequelae from falls except for soft tissue injury  -continue close observation from staff.   LOS (Days) 21 A FACE TO FACE EVALUATION WAS PERFORMED  Janis Sol T 07/18/2015 9:54 AM

## 2015-07-18 NOTE — Progress Notes (Addendum)
Physical Therapy Weekly Progress Note  Patient Details  Name: Darryl [name] MRN: [id number] Date of Birth: 03-Dec-1950  Beginning of progress report period: July 12, 2015 End of progress report period: July 18, 2015  Today's Date: 07/18/2015 PT Individual Time: 1005-1105 PT Individual Time Calculation (min): 60 min   Patient has met 3 of 3 short term goals. Patient has made limited progress with mobility this week due to fall over weekend resulting increased R sided pain, however patient is willing to work through pain with increased time and currently requires min A overall with brief periods of supervision. Plan to initiate hands-on training with family in preparation for discharge home.   Patient continues to demonstrate the following deficits: pain, muscle weakness, muscle tightness, decreased endurance, decreased orientation, decreased attention, decreased awareness, decreased problem solving, decreased safety awareness, decreased memory, decreased standing balance, decreased postural control, and decreased balance strategies and therefore will continue to benefit from skilled PT intervention to enhance overall performance with activity tolerance, balance, postural control, ability to compensate for deficits, functional use of  right upper extremity, right lower extremity, left upper extremity and left lower extremity, attention, awareness and coordination.  Patient progressing toward long term goals.  Plan of care revisions: Downgraded distance for ambulation due to LE pain and decreased activity tolerance. .  PT Short Term Goals Week 3:  PT Short Term Goal 1 (Week 3): Patient will ambulate 150 ft using RW with min A.  PT Short Term Goal 1 - Progress (Week 3): Met PT Short Term Goal 2 (Week 3): Patient will negotiate up/down 10 stairs using 1 rail with min A.  PT Short Term Goal 2 - Progress (Week 3): Met PT Short Term Goal 3 (Week 3): Patient will be oriented to time, place, and  situation with mod verbal cues.  PT Short Term Goal 3 - Progress (Week 3): Met Week 4:  PT Short Term Goal 1 (Week 4): = LTGs due to anticipated LOS  Skilled Therapeutic Interventions/Progress Updates:   Patient sitting in wheelchair, reporting increased R sided pain and deferring ambulation initially. Patient required max multimodal cues to recall therapy session from yesterday. Patient propelled wheelchair using BUE x 150 ft with supervision questioning cues for path finding to familiar location of gym. Performed sit <> stand transfers from wheelchair with mod A faded to min A and increased time due to pain. Patient ambulated throughout controlled environment using RW x 150 ft with supervision-min guard and max verbal cues for upright posture/forward gaze at slow pace, multiple standing rest breaks to fatigue. Patient limited by increased R sided pain and c/o RLE pain and weakness. Performed seated reaching from wheelchair using RUE in all directions slightly outside BOS and within BOS, patient tolerated well with rest breaks and demonstrated improved ease of movement using R side despite grimacing due to pain. Stair training up/down 8 (6") stairs using 2 rails due to increased pain this date with min cues for step-to pattern, supervision-min guard overall with seated rest break between trials. Patient ambulated from room <> bathroom using RW with min guard and performed clothing management in standing with steady assist and hygiene while seated with supervision. Patient left sitting in wheelchair with quick release belt donned at RN station.    Therapy Documentation Precautions:  Precautions Precautions: Fall Restrictions Weight Bearing Restrictions: No Pain: Pain Assessment Pain Assessment: 0-10 Pain Score: 8  Faces Pain Scale: Hurts little more Pain Type: Acute pain Pain Location: Rib cage Pain Orientation: Right  Pain Descriptors / Indicators: Aching;Grimacing;Discomfort Pain Onset: With  Activity Pain Intervention(s): Rest;Emotional support;Ambulation/increased activity  See Function Navigator for Current Functional Status.  Therapy/Group: Individual Therapy  Laretta Alstrom 07/18/2015, 12:30 PM

## 2015-07-18 NOTE — Progress Notes (Signed)
Social Work Patient ID: Claria Dice, male   DOB: 14-Oct-1950, 65 y.o.   MRN: [id number]  Have spoken with two of pt's daughters via conference call this morning (Tiffany and Chantel) and then had discussion with pt's wife this afternoon to confirm all family agreed on d/c plan moving forward.  Family now plans for pt and wife to go to daughter's home in [place], [place].  Dtr, Elmarie Shiley, does work as a Runner, broadcasting/film/video, however, she and family are available to assist after work/ evenings to allow wife support she needs.  Wife in agreement with plan but saddened that they will not be at their own home.  She is realistic that she needs this support.   Have reviewed steps that are needed to get pt  the skilled services he needs at d/c.  Daughter to contact her physician to see if she will assume primary care of pt while he is at her home (this will be required by local [facility]).  I will order DME from [facility] to have prior to d/c.  Daughter also notes that her home has 3 STE, a bedroom and 1/2 bath on entry level.  Have alerted tx team to plan.  Need to coordinate family ed time with wife and daughter still.  Continue to follow.  Celenia Hruska, LCSW

## 2015-07-18 NOTE — Progress Notes (Signed)
Occupational Therapy Note  Patient Details  Name: Darryl [name] MRN: [id number] Date of Birth: June 09, 1950  Today's Date: 07/18/2015 OT Individual Time: 1130-1200 OT Individual Time Calculation (min): 30 min   Pt denied pain Individual Therapy  Pt resting in w/c upon arrival.  Pt engaged in table activity with PVC piping and pictures of structures.  Pt completed two structures (easy and moderately hard).  Pt required more than a reasonable amount of time to complete tasks but was able to complete them without any verbal cues.  Informed patient that this was an improvement from last time he attempted tasks. Pt pleased with improvement.  Pt remained in w/c and returned to RN station. Focus on cognitive remediation, sequencing, and attention to task.   [name] 07/18/2015, 12:15 PM

## 2015-07-18 NOTE — Progress Notes (Signed)
Occupational Therapy Session Note  Patient Details  Name: Darryl [name] MRN: [id number] Date of Birth: April 05, 1951  Today's Date: 07/18/2015 OT Individual Time: 4098-1191 OT Individual Time Calculation (min): 75 min    Short Term Goals: Week 3:  OT Short Term Goal 1 (Week 3): Pt will transfer to shower seat with min A and min verbal cues for sequencing OT Short Term Goal 2 (Week 3): Pt will complete LB dressing with supervision OT Short Term Goal 3 (Week 3): Pt will perform toileting tasks with supervison OT Short Term Goal 4 (Week 3): Pt will recall activities from previous day wil mod verbal cues and using compensatory strategies  Skilled Therapeutic Interventions/Progress Updates:    Pt engaged in BADL retraining including bathing at shower level, toilet transfers and toileting, dressing with sit<>stand from EOB, and eating breakfast.  Pt amb with RW to bathroom to use toilet and transfer to shower.  Pt continues to require min A with ambulation and transfers with extra time.  Pt completes all bathing and dressing tasks with steady A only when standing.  Pt was initially concerned that he couldn't find his "suit", thinking he was going to work.  Pt reoriented to place and situation.  Pt continues to required increased time to initiate and complete all tasks with multiple breaks throughout session. Focus on task initiation, sequencing, and attention to task to increase independence with BADLs.   Therapy Documentation Precautions:  Precautions Precautions: Fall Restrictions Weight Bearing Restrictions: No Pain: Pain Assessment Pain Assessment: Faces Faces Pain Scale: Hurts little more Pain Type: Acute pain Pain Location: Rib cage Pain Orientation: Right Pain Descriptors / Indicators: Grimacing Pain Onset: With Activity Pain Intervention(s): Repositioned;Emotional support  See Function Navigator for Current Functional Status.   Therapy/Group: Individual Therapy  Rich Brave 07/18/2015, 9:34 AM

## 2015-07-18 NOTE — Progress Notes (Signed)
Speech Language Pathology Daily Session Note  Patient Details  Name: Darryl [name] MRN: [id number] Date of Birth: 02/06/1951  Today's Date: 07/18/2015 SLP Individual Time: 1345-1430 SLP Individual Time Calculation (min): 45 min  Short Term Goals: Week 3: SLP Short Term Goal 1 (Week 3): Patient will orient to time, place and situation with Min A question and verbal cues.  SLP Short Term Goal 2 (Week 3): Patient will demonstrate sustained attention to functional tasks for 5 minutes with Mod A verbal cues for redirection. SLP Short Term Goal 3 (Week 3): Patient will utilize call bell to request assistance with Max A multimodal cues in 25% of opportunities.  SLP Short Term Goal 4 (Week 3): Patient will demonstrate functional problem solving for basic and familiar tasks with Mod A verbal and visual cues.  SLP Short Term Goal 5 (Week 3): Patient will consume current diet with minimal overt s/s of aspiration with Min A verbal cues for use of swallowing compensatory strategies.  SLP Short Term Goal 6 (Week 3): Patient will recall 1 activity from a previous therapy session with Mod A question and verbal cues.   Skilled Therapeutic Interventions: Skilled treatment session focused on cognitive goals. SLP facilitated session by providing extra time for patient to sit EOB and donn socks. Patient transferred to the wheelchair with Min A due to pain. SLP also facilitated session by providing Max A verbal cues for orientation to date and supervision verbal cues for orientation to place and situation. Patient participated in basic problem solving tasks with supervision question cues and utilized schedule to recall previous therapy sessions with Max-Total A multimodal cues. Patient left upright in wheelchair with family present. Continue with current plan of care.    Function:   Cognition Comprehension Comprehension assist level: Understands basic 75 - 89% of the time/ requires cueing 10 - 24% of the time   Expression   Expression assist level: Expresses basic 90% of the time/requires cueing < 10% of the time.  Social Interaction Social Interaction assist level: Interacts appropriately 50 - 74% of the time - May be physically or verbally inappropriate.  Problem Solving Problem solving assist level: Solves basic 50 - 74% of the time/requires cueing 25 - 49% of the time  Memory Memory assist level: Recognizes or recalls 25 - 49% of the time/requires cueing 50 - 75% of the time    Pain Pain in ribs with movement. RN made aware   Therapy/Group: Individual Therapy  Skylynne Schlechter 07/18/2015, 4:57 PM

## 2015-07-19 ENCOUNTER — Inpatient Hospital Stay (HOSPITAL_COMMUNITY): Payer: BC Managed Care – PPO | Admitting: Speech Pathology

## 2015-07-19 ENCOUNTER — Inpatient Hospital Stay (HOSPITAL_COMMUNITY): Payer: BC Managed Care – PPO

## 2015-07-19 ENCOUNTER — Inpatient Hospital Stay (HOSPITAL_COMMUNITY): Payer: BC Managed Care – PPO | Admitting: Physical Therapy

## 2015-07-19 LAB — BASIC METABOLIC PANEL
Anion gap: 9 (ref 5–15)
BUN: 11 mg/dL (ref 6–20)
CO2: 23 mmol/L (ref 22–32)
Calcium: 9.5 mg/dL (ref 8.9–10.3)
Chloride: 107 mmol/L (ref 101–111)
Creatinine, Ser: 0.87 mg/dL (ref 0.61–1.24)
GFR calc Af Amer: >60 mL/min (ref >60)
GFR calc non Af Amer: >60 mL/min (ref >60)
Glucose, Bld: 122 mg/dL — ABNORMAL HIGH (ref 65–99)
Potassium: 4.4 mmol/L (ref 3.5–5.1)
Sodium: 139 mmol/L (ref 135–145)

## 2015-07-19 LAB — GLUCOSE, CAPILLARY
Glucose-Capillary: 103 mg/dL — ABNORMAL HIGH (ref 65–99)
Glucose-Capillary: 119 mg/dL — ABNORMAL HIGH (ref 65–99)
Glucose-Capillary: 149 mg/dL — ABNORMAL HIGH (ref 65–99)
Glucose-Capillary: 120 mg/dL — ABNORMAL HIGH (ref 65–99)

## 2015-07-19 NOTE — Progress Notes (Signed)
[place] PHYSICAL MEDICINE & REHABILITATION     PROGRESS NOTE    Subjective/Complaints: Up eating breakfast. Pain a little better.  ROS limited by cognition and behavior, but appears to deny CP, SOB, N/V/D.   Objective: Vital Signs: Blood pressure 95/59, pulse 67, temperature 98.4 F (36.9 C), temperature source Oral, resp. rate 20, height  (1.803 m), weight 76.476 kg (168 lb 9.6 oz), SpO2 100 %. No results found. No results for input(s): WBC, HGB, HCT, PLT in the last 72 hours.  Recent Labs  07/19/15 0544  NA 139  K 4.4  CL 107  GLUCOSE 122*  BUN 11  CREATININE 0.87  CALCIUM 9.5   CBG (last 3)   Recent Labs  07/18/15 1701 07/18/15 2107 07/19/15 0623  GLUCAP 124* 133* 103*    Wt Readings from Last 3 Encounters:  07/18/15 76.476 kg (168 lb 9.6 oz)  06/13/15 89.6 kg (197 lb 8.5 oz)  06/08/15 91.627 kg (202 lb)    Physical Exam:  Constitutional: He appears well-developed and well-nourished. NAD. Vital signs reviewed. HENT: Normocephalic. Scalp clean and dry Eyes: Conjunctivae and EOM are normal.  Cardiovascular: Normal rate and regular rhythm.  Respiratory: Effort normal and breath sounds normal. No stridor. No respiratory distress. He has no wheezes.  GI: Soft. Bowel sounds are normal. He exhibits no distension. There is no tenderness.  Musculoskeletal: + Tenderness right lateral thorax with morvement and palpation. No bruising. No erythema, edema, warm Neurological: He is alert.  A&Ox2.   slow to process and initiate still  Still Lacks insight and awareness of deficits.    Motor: 4+/5 grossly throughout. Ataxia RUE. Skin: Skin is warm and dry.  Psychiatric: Cognition and memory are impaired. He expresses inappropriate judgment. He remains inattentive.   Assessment/Plan:  1. Cognitive, swallowing, and mobility deficits secondary to [facility] which require 3+ hours per day of interdisciplinary therapy in a comprehensive inpatient rehab  setting. Physiatrist is providing close team supervision and 24 hour management of active medical problems listed below. Physiatrist and rehab team continue to assess barriers to discharge/monitor patient progress toward functional and medical goals.  Function:  Bathing Bathing position   Position: Shower  Bathing parts Body parts bathed by patient: Right arm, Left arm, Chest, Abdomen, Front perineal area, Buttocks, Right upper leg, Left upper leg, Right lower leg, Left lower leg Body parts bathed by helper: Back  Bathing assist Assist Level: Supervision or verbal cues      Upper Body Dressing/Undressing Upper body dressing   What is the patient wearing?: Pull over shirt/dress     Pull over shirt/dress - Perfomed by patient: Thread/unthread right sleeve, Thread/unthread left sleeve, Put head through opening Pull over shirt/dress - Perfomed by helper: Pull shirt over trunk        Upper body assist Assist Level: Set up, Supervision or verbal cues   Set up : To obtain clothing/put away  Lower Body Dressing/Undressing Lower body dressing Lower body dressing/undressing activity did not occur: Refused What is the patient wearing?: Underwear, Pants, Shoes, [employer] - Performed by patient: Thread/unthread right underwear leg, Thread/unthread left underwear leg, Pull underwear up/down Underwear - Performed by helper: Thread/unthread right underwear leg, Thread/unthread left underwear leg, Pull underwear up/down Pants- Performed by patient: Thread/unthread right pants leg, Thread/unthread left pants leg, Pull pants up/down Pants- Performed by helper: Pull pants up/down Non-skid slipper socks- Performed by patient: Don/doff right sock, Don/doff left sock Non-skid slipper socks- Performed by helper: Don/doff right sock,  Don/doff left sock     Shoes - Performed by patient: Don/doff right shoe, Don/doff left shoe, Fasten right, Fasten left Shoes - Performed by helper: Fasten right,  Fasten left     TED Hose - Performed by patient: Don/doff right TED hose, Don/doff left TED hose TED Hose - Performed by helper: Don/doff right TED hose, Don/doff left TED hose  Lower body assist Assist for lower body dressing: Supervision or verbal cues      Toileting Toileting Toileting activity did not occur: N/A Toileting steps completed by patient: Adjust clothing prior to toileting, Performs perineal hygiene, Adjust clothing after toileting Toileting steps completed by helper: Adjust clothing after toileting Toileting Assistive Devices: Grab bar or rail  Toileting assist Assist level: Touching or steadying assistance (Pt.75%)   Transfers Chair/bed transfer   Chair/bed transfer method: Ambulatory Chair/bed transfer assist level: Touching or steadying assistance (Pt > 75%) Chair/bed transfer assistive device: Armrests, Patent attorney Ambulation activity did not occur: Refused   Max distance: 150 Assist level: Touching or steadying assistance (Pt > 75%)   Wheelchair   Type: Manual Max wheelchair distance: 150 Assist Level: Supervision or verbal cues  Cognition Comprehension Comprehension assist level: Understands basic 75 - 89% of the time/ requires cueing 10 - 24% of the time  Expression Expression assist level: Expresses basic 90% of the time/requires cueing < 10% of the time.  Social Interaction Social Interaction assist level: Interacts appropriately 50 - 74% of the time - May be physically or verbally inappropriate.  Problem Solving Problem solving assist level: Solves basic 50 - 74% of the time/requires cueing 25 - 49% of the time  Memory Memory assist level: Recognizes or recalls 25 - 49% of the time/requires cueing 50 - 75% of the time   Medical Problem List and Plan: 1. 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   -safety plans 2. LLE  DVT: eliquis. No bleeding sequelae 3. Pain Management:  oxycodone prn for headaches and back/leg pain.  4. Mood: LCSW to follow for evaluation and support.  5. Neuropsych: This patient is not capable of making decisions on his own behalf.  -continue to provide appropriate environment for sleep and communication  -environmental mod  -continue sleep chart  -increased ritalin to 15 mg bid to augment attention/processing 6. Skin/Wound Care: routine pressure relief measures.  7. Fluids/Electrolytes/Nutrition: intake inconsistent. Added nutritional supplements. Monitor for now.  .  -encourage PO 8. HTN: improved control   -resumed low dose Cozaar with good control    9. DM type 2:   -continue to hold metformin and use SSI for now. sugars remain under control but intake is poor  -Megace helpful for appetite    -sugars generally between 100-130 10. Seizures disorder: Was on lamictal bid prior to admission. On keppra PO bid currently 11. ABLA: continue iron supplement..  12. Mild ileus:   two Senna S bid. Use enema prn.  13. Hypokalemia: Supplement to avoid recurrence of ileus.  14. Leukocytosis: Resolved  ua neg, cx with multispecies  -remains afebrile  15. Chronic right knee pain/lower ext pain: improving   -voltaren gel, prn ICE 16. PVCs: Negative cardiac work up 05/2015 17. Safety:  continue  low bed    -room next to RN station  -continue close observation from staff.   LOS (Days) 22 A FACE TO FACE EVALUATION WAS PERFORMED  Reginald Mangels T 07/19/2015  9:08 AM

## 2015-07-19 NOTE — Progress Notes (Signed)
Physical Therapy Session Note  Patient Details  Name: Quamel Fitzmaurice MRN: [id number] Date of Birth: 03/21/1951  Today's Date: 07/19/2015 PT Individual Time: 0905-1000 PT Individual Time Calculation (min): 55 min   Short Term Goals: Week 4:  PT Short Term Goal 1 (Week 4): = LTGs due to anticipated LOS  Skilled Therapeutic Interventions/Progress Updates:   Patient required supervision overall for sit <> stand transfers with min cues for sequencing hand placement due to R rib cage pain, ambulation using RW x 75 ft + 25 ft + 100 ft on tile and carpet in day room with max verbal/tactile cues for upright posture and forward gaze, and while engaging in standing balance task via [employer] mode A, 2 trials using RW progressed to 2 trials without AD. Patient able to tolerate standing up to 4-5 min at a time before requesting to sit. Patient scored 28, 26, 32 and 32 hits on 4 trials. Patient's wife present to observe session and provided supervision assist while ambulating back to room. Patient required min questioning cues and encouragement for problem solving and orientation to situation. Patient left sitting in wheelchair with quick release belt on with wife in room. Patient required increased time to complete all tasks due to pain but tolerated session well with rest breaks as needed.    Therapy Documentation Precautions:  Precautions Precautions: Fall Restrictions Weight Bearing Restrictions: No Pain: Pain Assessment Pain Assessment: 0-10 Pain Score: 6  Faces Pain Scale: Hurts little more Pain Type: Acute pain Pain Location: Head Pain Orientation: Right;Lateral Pain Descriptors / Indicators: Aching Pain Frequency: Intermittent Pain Onset: On-going Patients Stated Pain Goal: 2 Pain Intervention(s): Medication (See eMAR) Multiple Pain Sites: No   See Function Navigator for Current Functional Status.   Therapy/Group: Individual Therapy  Kerney Elbe 07/19/2015, 10:03 AM

## 2015-07-19 NOTE — Progress Notes (Signed)
Occupational Therapy Session Note  Patient Details  Name: Darryl [name] MRN: [id number] Date of Birth: 1951-04-29  Today's Date: 07/19/2015 OT Individual Time: 0700-0830 OT Individual Time Calculation (min): 90 min    Short Term Goals: Week 3:  OT Short Term Goal 1 (Week 3): Pt will transfer to shower seat with min A and min verbal cues for sequencing OT Short Term Goal 1 - Progress (Week 3): Met OT Short Term Goal 2 (Week 3): Pt will complete LB dressing with supervision OT Short Term Goal 2 - Progress (Week 3): Met OT Short Term Goal 3 (Week 3): Pt will perform toileting tasks with supervison OT Short Term Goal 3 - Progress (Week 3): Met OT Short Term Goal 4 (Week 3): Pt will recall activities from previous day wil mod verbal cues and using compensatory strategies OT Short Term Goal 4 - Progress (Week 3): Met  Skilled Therapeutic Interventions/Progress Updates:   Pt asleep upon arrival but easily aroused.  Pt engaged in BADL retraining including bathing at shower level, dressing with sit<>stand from EOB, and self feeding.  Pt amb with RW to bathroom and completed bathing tasks with close supervision when standing.  Pt returned to bed and performed all dressing tasks, including donning Ted hose, with close supervision. Pt required max verbal cues for orientation to place, date, and situation this morning. Pt performed all self feeding tasks with supervision.  Pt requires more than a reasonable amount of time with minimal distractions to complete all tasks.  Focus on activity tolerance, sit<>stand, standing balance, cognitive remediation, funcitonal amb with RW, and safety awareness to increased independence with BADLs and reduce burden of care.   Therapy Documentation Precautions:  Precautions Precautions: Fall Restrictions Weight Bearing Restrictions: No Pain: Pain Assessment Pain Assessment: Faces Pain Score: 2  Faces Pain Scale: Hurts little more Pain Type: Acute pain Pain  Location: Rib cage Pain Orientation: Right;Lateral Pain Descriptors / Indicators: Aching Pain Onset: On-going Pain Intervention(s): RN made aware;Repositioned    See Function Navigator for Current Functional Status.   Therapy/Group: Individual Therapy  Leroy Libman 07/19/2015, 8:31 AM

## 2015-07-19 NOTE — Progress Notes (Signed)
Speech Language Pathology Daily Session Note  Patient Details  Name: Darryl [name] MRN: [id number] Date of Birth: [birth year]  Today's Date: 07/19/2015 SLP Individual Time: 4098-1191 SLP Individual Time Calculation (min): 45 min  Short Term Goals: Week 3: SLP Short Term Goal 1 (Week 3): Patient will orient to time, place and situation with Min A question and verbal cues.  SLP Short Term Goal 2 (Week 3): Patient will demonstrate sustained attention to functional tasks for 5 minutes with Mod A verbal cues for redirection. SLP Short Term Goal 3 (Week 3): Patient will utilize call bell to request assistance with Max A multimodal cues in 25% of opportunities.  SLP Short Term Goal 4 (Week 3): Patient will demonstrate functional problem solving for basic and familiar tasks with Mod A verbal and visual cues.  SLP Short Term Goal 5 (Week 3): Patient will consume current diet with minimal overt s/s of aspiration with Min A verbal cues for use of swallowing compensatory strategies.  SLP Short Term Goal 6 (Week 3): Patient will recall 1 activity from a previous therapy session with Mod A question and verbal cues.   Skilled Therapeutic Interventions: Skilled treatment session focused on cognitive goals. Upon arrival, patient was awake while upright in his wheelchair. Patient independently requested to use the bathroom and was continent of urine and required Min A verbal cues to complete task safely. SLP also facilitated session by utilizing external memory aids to recall previous therapy sessions and to identify at least 1 activity per session, patient completed task with Mod A question cues. Patient also recalled his current medications and their functions with overall Max A multimodal cues and will organize a 4 time per day pill box at the next session. Patient left upright in wheelchair with wife present. Continue with current plan of care.    Function:  Eating Eating   Modified Consistency Diet:  Yes Eating Assist Level: Supervision or verbal cues   Eating Set Up Assist For: Opening containers       Cognition Comprehension Comprehension assist level: Understands basic 75 - 89% of the time/ requires cueing 10 - 24% of the time  Expression   Expression assist level: Expresses complex 90% of the time/cues < 10% of the time  Social Interaction Social Interaction assist level: Interacts appropriately 50 - 74% of the time - May be physically or verbally inappropriate.  Problem Solving Problem solving assist level: Solves basic 50 - 74% of the time/requires cueing 25 - 49% of the time  Memory Memory assist level: Recognizes or recalls 25 - 49% of the time/requires cueing 50 - 75% of the time    Pain Intermittent rib pain when moving   Therapy/Group: Individual Therapy  Brice Kossman 07/19/2015, 3:49 PM

## 2015-07-19 NOTE — Progress Notes (Signed)
Occupational Therapy Weekly Progress Note  Patient Details  Name: Darryl [name] MRN: [id number] Date of Birth: August 03, 1950  Beginning of progress report period: July 13, 2015 End of progress report period: July 19, 2015   Patient has met 4 of 4 short term goals.  Pt made steady progress with BADLs since the last weekly progress note.  Pt is supervision for bathing and dressing tasks and steady A for shower transfers.  Pt continues to require mod questioning cues for orientation to place and date.  Pt is inconsistently oriented to situation.  Pt requires more than a reasonable amount of time to complete tasks with min verbal cues for redirection to task.  Patient continues to demonstrate the following deficits: decreased standing balance and problem solving for negotiating turns with RW into tight spaces, decreased activity tolerance impacted by knee pain, decreased cognition of orientation, sequencing, attention and therefore will continue to benefit from skilled OT intervention to enhance overall performance with BADL.  Patient progressing toward long term goals..  Continue plan of care.  OT Short Term Goals Week 3:  OT Short Term Goal 1 (Week 3): Pt will transfer to shower seat with min A and min verbal cues for sequencing OT Short Term Goal 1 - Progress (Week 3): Met OT Short Term Goal 2 (Week 3): Pt will complete LB dressing with supervision OT Short Term Goal 2 - Progress (Week 3): Met OT Short Term Goal 3 (Week 3): Pt will perform toileting tasks with supervison OT Short Term Goal 3 - Progress (Week 3): Met OT Short Term Goal 4 (Week 3): Pt will recall activities from previous day wil mod verbal cues and using compensatory strategies OT Short Term Goal 4 - Progress (Week 3): Met Week 4:  OT Short Term Goal 1 (Week 4): STG=LTG secondary to ELOS  Therapy Documentation Precautions:  Precautions Precautions: Fall Restrictions Weight Bearing Restrictions: No  See Function  Navigator for Current Functional Status.    [name] [facility] 07/19/2015, 8:15 AM

## 2015-07-20 ENCOUNTER — Inpatient Hospital Stay (HOSPITAL_COMMUNITY): Payer: BC Managed Care – PPO | Admitting: Physical Therapy

## 2015-07-20 ENCOUNTER — Inpatient Hospital Stay (HOSPITAL_COMMUNITY): Payer: BC Managed Care – PPO | Admitting: Occupational Therapy

## 2015-07-20 ENCOUNTER — Inpatient Hospital Stay (HOSPITAL_COMMUNITY): Payer: BC Managed Care – PPO | Admitting: Speech Pathology

## 2015-07-20 LAB — GLUCOSE, CAPILLARY
Glucose-Capillary: 141 mg/dL — ABNORMAL HIGH (ref 65–99)
Glucose-Capillary: 119 mg/dL — ABNORMAL HIGH (ref 65–99)
Glucose-Capillary: 102 mg/dL — ABNORMAL HIGH (ref 65–99)
Glucose-Capillary: 113 mg/dL — ABNORMAL HIGH (ref 65–99)

## 2015-07-20 NOTE — Progress Notes (Signed)
Occupational Therapy Session Note  Patient Details  Name: Darryl [name] MRN: [id number] Date of Birth: 1950/12/03  Today's Date: 07/20/2015  :OT Individual Time Calculation (min): 60 min   1300-1400     Short Term Goals: Week 1:  OT Short Term Goal 1 (Week 1): Pt will complete all bathing with min assist sit to stand. OT Short Term Goal 1 - Progress (Week 1): Progressing toward goal OT Short Term Goal 2 (Week 1): Pt will complete LB dressing with min assist sit to stand with AE PRN. OT Short Term Goal 2 - Progress (Week 1): Progressing toward goal OT Short Term Goal 3 (Week 1): Pt will transfer to the walk-in shower with min assist using the RW and shower seat.  OT Short Term Goal 3 - Progress (Week 1): Progressing toward goal OT Short Term Goal 4 (Week 1): Pt will complete toilet transfer with min assist to 3:1 OT Short Term Goal 4 - Progress (Week 1): Progressing toward goal OT Short Term Goal 5 (Week 1): Pt will demonstrate anticipatory awareness by waiting for assistance prior to completing any sit to stand transtions or transfers.  OT Short Term Goal 5 - Progress (Week 1): Met Week 2:  OT Short Term Goal 1 (Week 2): Pt will transfer to 3 in 1 over toilet with min A. OT Short Term Goal 1 - Progress (Week 2): Met OT Short Term Goal 2 (Week 2): Pt will transfer to shower seat with min A. OT Short Term Goal 2 - Progress (Week 2): Partly met OT Short Term Goal 3 (Week 2): Pt will sit to stand with min A to prepare for LB self care. OT Short Term Goal 3 - Progress (Week 2): Met OT Short Term Goal 4 (Week 2): Pt will manage clothing over hips for toileting tasks. OT Short Term Goal 4 - Progress (Week 2): Met  Skilled Therapeutic Interventions/Progress Updates:   .OT addressed basic strength, endurance, balance in functional tasks.  Pt. Transferred from bed to wc>toilet>to wc with mod to min assist plus cues for technique and postioning.  Wife observed toilet transfer with pt ambulating to  toilet with min assist and standing to donn and doff clothes.  He propelled wc to gym with cues for directions.  Engaged in standing tasks with nuts and bolts, basketball horseshoes. Standing balance.  Pt propelled wc back to room with min assist for last 25 feet due to fatigue.   Pt. Transferred to bed to rest for 1 hour before next therapy.  Wife present.  Placed bed alarm on and all items in reach.    Therapy Documentation Precautions:  Precautions Precautions: Fall Restrictions Weight Bearing Restrictions: No       Pain: Pain Assessment Pain Assessment: 0-10 Pain Score: 6  Pain Type: Acute pain Pain Location: Head Pain Descriptors / Indicators: Aching Pain Frequency: Intermittent Pain Onset: On-going Patients Stated Pain Goal: 2 Pain Intervention(s): Medication (See eMAR) Multiple Pain Sites: No         See Function Navigator for Current Functional Status.   Therapy/Group: Individual Therapy  Lisa Roca 07/20/2015, 12:59 PM

## 2015-07-20 NOTE — Progress Notes (Signed)
[place] PHYSICAL MEDICINE & REHABILITATION     PROGRESS NOTE    Subjective/Complaints: Just finished breakfast. Getting back in bed.  ROS limited by cognition and behavior, but appears to deny CP, SOB, N/V/D.   Objective: Vital Signs: Blood pressure 107/68, pulse 62, temperature 98.3 F (36.8 C), temperature source Oral, resp. rate 20, height 5\' 11"  (1.803 m), weight 76.476 kg (168 lb 9.6 oz), SpO2 100 %. No results found. No results for input(s): WBC, HGB, HCT, PLT in the last 72 hours.  Recent Labs  07/19/15 0544  NA 139  K 4.4  CL 107  GLUCOSE 122*  BUN 11  CREATININE 0.87  CALCIUM 9.5   CBG (last 3)   Recent Labs  07/19/15 1657 07/19/15 2049 07/20/15 0646  GLUCAP 149* 120* 141*    Wt Readings from Last 3 Encounters:  07/18/15 76.476 kg (168 lb 9.6 oz)  06/13/15 89.6 kg (197 lb 8.5 oz)  06/08/15 91.627 kg (202 lb)    Physical Exam:  Constitutional: He appears well-developed and well-nourished. NAD. Vital signs reviewed. HENT: Normocephalic. Scalp clean and dry Eyes: Conjunctivae and EOM are normal.  Cardiovascular: Normal rate and regular rhythm.  Respiratory: Effort normal and breath sounds normal. No stridor. No respiratory distress. He has no wheezes.  GI: Soft. Bowel sounds are normal. He exhibits no distension. There is no tenderness.  Musculoskeletal: + Tenderness right lateral thorax with morvement and palpation. No bruising. No erythema, edema, warm Neurological: He is alert.  A&Ox2.   Slow to process and initiate still  Still Lacks insight and awareness of deficits.    Motor: 4+/5 grossly throughout. Ataxia RUE. Skin: Skin is warm and dry.  Psychiatric: Cognition and memory are impaired. He expresses inappropriate judgment. He remains inattentive.   Assessment/Plan:  1. Cognitive, swallowing, and mobility deficits secondary to [facility] which require 3+ hours per day of interdisciplinary therapy in a comprehensive inpatient rehab  setting. Physiatrist is providing close team supervision and 24 hour management of active medical problems listed below. Physiatrist and rehab team continue to assess barriers to discharge/monitor patient progress toward functional and medical goals.  Function:  Bathing Bathing position   Position: Shower  Bathing parts Body parts bathed by patient: Right arm, Left arm, Chest, Abdomen, Front perineal area, Buttocks, Right upper leg, Left upper leg, Right lower leg, Left lower leg Body parts bathed by helper: Back  Bathing assist Assist Level: Supervision or verbal cues      Upper Body Dressing/Undressing Upper body dressing   What is the patient wearing?: Pull over shirt/dress     Pull over shirt/dress - Perfomed by patient: Thread/unthread right sleeve, Thread/unthread left sleeve, Put head through opening Pull over shirt/dress - Perfomed by helper: Pull shirt over trunk        Upper body assist Assist Level: Set up, Supervision or verbal cues   Set up : To obtain clothing/put away  Lower Body Dressing/Undressing Lower body dressing Lower body dressing/undressing activity did not occur: Refused What is the patient wearing?: Underwear, Pants, Shoes, [employer] - Performed by patient: Thread/unthread right underwear leg, Thread/unthread left underwear leg, Pull underwear up/down Underwear - Performed by helper: Thread/unthread right underwear leg, Thread/unthread left underwear leg, Pull underwear up/down Pants- Performed by patient: Thread/unthread right pants leg, Thread/unthread left pants leg, Pull pants up/down Pants- Performed by helper: Pull pants up/down Non-skid slipper socks- Performed by patient: Don/doff right sock, Don/doff left sock Non-skid slipper socks- Performed by helper: Don/doff right sock,  Don/doff left sock     Shoes - Performed by patient: Don/doff right shoe, Don/doff left shoe, Fasten right, Fasten left Shoes - Performed by helper: Fasten right,  Fasten left     TED Hose - Performed by patient: Don/doff right TED hose, Don/doff left TED hose TED Hose - Performed by helper: Don/doff right TED hose, Don/doff left TED hose  Lower body assist Assist for lower body dressing: Supervision or verbal cues      Toileting Toileting Toileting activity did not occur: N/A Toileting steps completed by patient: Adjust clothing prior to toileting, Performs perineal hygiene, Adjust clothing after toileting Toileting steps completed by helper: Adjust clothing after toileting Toileting Assistive Devices: Grab bar or rail  Toileting assist Assist level: Touching or steadying assistance (Pt.75%)   Transfers Chair/bed transfer   Chair/bed transfer method: Ambulatory Chair/bed transfer assist level: Supervision or verbal cues Chair/bed transfer assistive device: Armrests, Patent attorney Ambulation activity did not occur: Refused   Max distance: 100 Assist level: Supervision or verbal cues   Wheelchair   Type: Manual Max wheelchair distance: 150 Assist Level: Supervision or verbal cues  Cognition Comprehension Comprehension assist level: Understands basic 75 - 89% of the time/ requires cueing 10 - 24% of the time  Expression Expression assist level: Expresses complex 90% of the time/cues < 10% of the time  Social Interaction Social Interaction assist level: Interacts appropriately 50 - 74% of the time - May be physically or verbally inappropriate.  Problem Solving Problem solving assist level: Solves basic 50 - 74% of the time/requires cueing 25 - 49% of the time  Memory Memory assist level: Recognizes or recalls 25 - 49% of the time/requires cueing 50 - 75% of the time   Medical Problem List and Plan: 1. 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   -safety plans in place 2. LLE DVT: eliquis. No bleeding  sequelae 3. Pain Management:  oxycodone prn for headaches and back/leg pain.  4. Mood: LCSW to follow for evaluation and support.  5. Neuropsych: This patient is not capable of making decisions on his own behalf.  -continue to provide appropriate environment for sleep and communication  -environmental mod  -continue sleep chart  -continue ritalin to 15 mg bid to augment attention/processing 6. Skin/Wound Care: routine pressure relief measures.  7. Fluids/Electrolytes/Nutrition: intake inconsistent. Added nutritional supplements. Monitor for now.  .  -encourage PO 8. HTN: improved control   -resumed low dose Cozaar with good control    9. DM type 2:   -continue to hold metformin and use SSI for now. sugars remain under control but intake is poor  -Megace helpful for appetite    -sugars generally between 100-130 10. Seizures disorder: Was on lamictal bid prior to admission. On keppra PO bid currently 11. ABLA: continue iron supplement..  12. Mild ileus:   two Senna S bid. Use enema prn.  13. Hypokalemia: Supplement to avoid recurrence of ileus.  14. Leukocytosis: Resolved  ua neg, cx with multispecies  -remains afebrile  15. Chronic right knee pain/lower ext pain: improving   -voltaren gel, prn ICE 16. PVCs: Negative cardiac work up 05/2015 17. Safety:  continue  low bed    -room next to RN station  -continue close observation from staff.  -has been a little better with safety awareness lately   LOS (Days) 23 A FACE TO FACE EVALUATION WAS  PERFORMED  SWARTZ,ZACHARY T 07/20/2015 10:23 AM

## 2015-07-20 NOTE — Progress Notes (Signed)
Physical Therapy Session Note  Patient Details  Name: Darryl [name] MRN: [id number] Date of Birth: [birth year]  Today's Date: 07/20/2015 PT Individual Time: 0539-7673 PT Individual Time Calculation (min): 30 min   Short Term Goals: Week 1:  PT Short Term Goal 1 (Week 1): Patient will ambulate 50 ft using LRAD with assist of 1 person.  PT Short Term Goal 1 - Progress (Week 1): Not met PT Short Term Goal 2 (Week 1): Patient will transfer bed <> wheelchair with mod A x 1.  PT Short Term Goal 2 - Progress (Week 1): Met PT Short Term Goal 3 (Week 1): Patient will negotiate up/down 12 stairs with assist of 1 person.  PT Short Term Goal 3 - Progress (Week 1): Not met PT Short Term Goal 4 (Week 1): Patient will propel wheelchair x 150 ft with supervision for strengthening/endurance.  PT Short Term Goal 4 - Progress (Week 1): Met PT Short Term Goal 5 (Week 1): Patient will maintain standing balance during functional task x 3 min with assist of 1 person.  PT Short Term Goal 5 - Progress (Week 1): Not met  Skilled Therapeutic Interventions/Progress Updates:  Pt was seen bedside in the pm. Pt transferred supine to edge of bed with side rail and S and verbal cues for safety. Pt transferred sit to stand with min guard and verbal cues. Pt ambulated to bathroom without assistive device and min a with verbal cues. Pt performed toilet transfers with min guard. Pt ambulated back to edge of bed without assistive device and min A. Pt performed multiple stands at edge of bed with min guard and verbal cues to don pants and underwear. Pt ambulated 100 feet with rolling walker and min guard and increased time. In gym pt performed step up exercises for strengthening and balance, 3 sets x5 reps each. Pt ambulated 100 feet with rolling walker and min guard back to room. Pt transferred edge of bed to supine with S and verbal cues. Pt left sitting up in bed with bed alarm engaged and call bell within reach.   Therapy  Documentation Precautions:  Precautions Precautions: Fall Restrictions Weight Bearing Restrictions: No General:   Vital Signs:   Pain: Pt c/o mild R knee pain.  See Function Navigator for Current Functional Status.   Therapy/Group: Individual Therapy  Dub Amis 07/20/2015, 4:02 PM

## 2015-07-20 NOTE — Progress Notes (Signed)
Speech Language Pathology Weekly Progress and Session Note  Patient Details  Name: Darryl [name] MRN: [id number] Date of Birth: 01-Feb-1951  Beginning of progress report period:  End of progress report period:   Today's Date: 07/20/2015 SLP Individual Time: 1123-1208 SLP Individual Time Calculation (min): 45 min  Short Term Goals: Week 3: SLP Short Term Goal 1 (Week 3): Patient will orient to time, place and situation with Min A question and verbal cues.  SLP Short Term Goal 1 - Progress (Week 3): Met SLP Short Term Goal 2 (Week 3): Patient will demonstrate sustained attention to functional tasks for 5 minutes with Mod A verbal cues for redirection. SLP Short Term Goal 2 - Progress (Week 3): Met SLP Short Term Goal 3 (Week 3): Patient will utilize call bell to request assistance with Max A multimodal cues in 25% of opportunities.  SLP Short Term Goal 3 - Progress (Week 3): Progressing toward goal SLP Short Term Goal 4 (Week 3): Patient will demonstrate functional problem solving for basic and familiar tasks with Mod A verbal and visual cues.  SLP Short Term Goal 4 - Progress (Week 3): Met SLP Short Term Goal 5 (Week 3): Patient will consume current diet with minimal overt s/s of aspiration with Min A verbal cues for use of swallowing compensatory strategies.  SLP Short Term Goal 5 - Progress (Week 3): Met SLP Short Term Goal 6 (Week 3): Patient will recall 1 activity from a previous therapy session with Mod A question and verbal cues.  SLP Short Term Goal 6 - Progress (Week 3): Met    New Short Term Goals: Week 4: SLP Short Term Goal 1 (Week 4): STG=LTG due to remaining length of stay.   Weekly Progress Updates:  Pt made functional gains this reporting period and has met 5 out of 6 short term goals.  Pt is currently at an overall min-mod assist level for basic cognitive tasks due to decreased recall of daily information, decreased sustained attention to tasks, decreased safety awareness,  and decreased functional problem solving.  Pt is consuming dys 2 textures and thin liquids with min assist for use of swallowing precautions.  Pt and family education is ongoing.  Pt would continue to benefit from skilled ST while inpatient in order to maximize functional independence and reduce burden of care prior to discharge.     Intensity: Minumum of 1-2 x/day, 30 to 90 minutes Frequency: 3 to 5 out of 7 days Duration/Length of Stay: 07/24/15 Treatment/Interventions: Cognitive remediation/compensation;Cueing hierarchy;Functional tasks;Patient/family education;Therapeutic Activities;Internal/external aids;Environmental controls;Dysphagia/aspiration precaution training   Daily Session  Skilled Therapeutic Interventions:  Pt was seen for skilled ST targeting cognitive goals.  SLP facilitated the session with a medication management task targeting recall of daily information, functional problem solving, and attention to task.  Pt was able to organize pills of varying dosages and frequencies into a pill box with min assist verbal cues to recognize and correct errors.  Pt was able to selectively attend to task in a mildly distracting environment for ~30 minutes with supervision verbal cues for redirection to task.  Pt was noted with increased errors in task organization when SLP attempted to further challenge pt with therapist generated distractions.  Pt was oriented to place, year, and situation with supervision question cues.  Pt was returned to room and left with call bell within reach, quick release belt donned.  Wife present at bedside.      Function:   Eating Eating  Cognition Comprehension Comprehension assist level: Understands basic 75 - 89% of the time/ requires cueing 10 - 24% of the time  Expression   Expression assist level: Expresses complex 90% of the time/cues < 10% of the time  Social Interaction Social Interaction assist level: Interacts appropriately 50 -  74% of the time - May be physically or verbally inappropriate.  Problem Solving Problem solving assist level: Solves basic 75 - 89% of the time/requires cueing 10 - 24% of the time  Memory Memory assist level: Recognizes or recalls 25 - 49% of the time/requires cueing 50 - 75% of the time   General    Pain Pain Assessment Pain Assessment: No/denies pain   Therapy/Group: Individual Therapy  Tayvon Culley, Selinda Orion 07/20/2015, 3:58 PM

## 2015-07-20 NOTE — Progress Notes (Signed)
Occupational Therapy Session Note  Patient Details  Name: Amani Nodarse MRN: [id number] Date of Birth: 17-Mar-1951  Today's Date: 07/20/2015 OT Individual Time: 1000-1100 OT Individual Time Calculation (min): 60 min    Short Term Goals: Week 4:  OT Short Term Goal 1 (Week 4): STG=LTG secondary to ELOS  Skilled Therapeutic Interventions/Progress Updates:    Pt seen for skilled OT to facilitate functional mobility, balance, processing skills with ADL retraining. Pt received in w/c c/o headache, RN contacted. Pt did ambulated towards closet and stated he felt dizzy. Pt sat for a few minutes and was able to resume, although he continued to be preoccupied during the session about tremors in his hands and his decreased balance. Continually encouraged to focus on breathing and to take his time. Pt completed all tasks of toileting, bathing, dressing with Supervision requiring occasional sequencing cues or prompts to move on to the next steps. He did require steadying A with all transfers, sit to stand as pt expressing that he did not feel "as stable" today. Time did not allow for oral care this session, passed that info on to his nurse. Pt in chair with quick release belt on. And call light in reach.  Therapy Documentation Precautions:  Precautions Precautions: Fall Restrictions Weight Bearing Restrictions: No    Pain: Pain Assessment Pain Assessment: 0-10 Pain Score: 6  Pain Type: Acute pain Pain Location: Head Pain Descriptors / Indicators: Aching Pain Frequency: Intermittent Pain Onset: On-going Patients Stated Pain Goal: 2 Pain Intervention(s): Medication (See eMAR) Multiple Pain Sites: No ADL:  See Function Navigator for Current Functional Status.   Therapy/Group: Individual Therapy  SAGUIER,JULIA 07/20/2015, 11:53 AM

## 2015-07-20 NOTE — Progress Notes (Signed)
Social Work Patient ID: Claria Dice, male   DOB: [birth year], 65 y.o.   MRN: [id number]   Have confirmed with pt's wife and daughter, Elmarie Shiley, that family education will be completed on Monday (dtr to be present).  Ordered DME and HH and daughter has already arranged that her own family  MD will follow pt's care needs while they are at her home [place] I will follow up with them all on Monday.  Edward Guthmiller, LCSW

## 2015-07-21 ENCOUNTER — Inpatient Hospital Stay (HOSPITAL_COMMUNITY): Payer: BC Managed Care – PPO | Admitting: *Deleted

## 2015-07-21 LAB — CBC
HCT: 33.6 % — ABNORMAL LOW (ref 39.0–52.0)
Hemoglobin: 11 g/dL — ABNORMAL LOW (ref 13.0–17.0)
MCH: 25.1 pg — ABNORMAL LOW (ref 26.0–34.0)
MCHC: 32.7 g/dL (ref 30.0–36.0)
MCV: 76.5 fL — ABNORMAL LOW (ref 78.0–100.0)
Platelets: 341 10*3/uL (ref 150–400)
RBC: 4.39 MIL/uL (ref 4.22–5.81)
RDW: 14.2 % (ref 11.5–15.5)
WBC: 7.5 10*3/uL (ref 4.0–10.5)

## 2015-07-21 LAB — GLUCOSE, CAPILLARY
Glucose-Capillary: 121 mg/dL — ABNORMAL HIGH (ref 65–99)
Glucose-Capillary: 96 mg/dL (ref 65–99)
Glucose-Capillary: 110 mg/dL — ABNORMAL HIGH (ref 65–99)
Glucose-Capillary: 275 mg/dL — ABNORMAL HIGH (ref 65–99)

## 2015-07-21 NOTE — Progress Notes (Signed)
Physical Therapy Session Note  Patient Details  Name: Darryl [name] MRN: [id number] Date of Birth: 15-Nov-1950  Today's Date: 07/21/2015 PT Individual Time: 0755-0910 PT Individual Time Calculation (min): 75 min    Skilled Therapeutic Interventions/Progress Updates:  Patient in room ,finishing breakfast at the beginning of session, agrees to intervention. Slight pain reported in R ribcage area, no knee pain. Initiated tx with verbal;izing steps for safe transfer from w/c to stand with min cues for remembering to lock the w/c. Gait Training with RW to gym with Supervision and decreased velocity, cues for step through pattern and postural corrections as well as forward gaze adn reduced weight bearing through UE.  Nu Step 1 x 10 min with resistance at 4 in order to increase reciprocal pattern of movement and facilitate strength and activity tolerance.  Sit to stand w/o using UE 2 x 5 initial set with min A progressed to Min guard and cues.  Alternating cone tapping with focus on weigh shifting and maintaining balance in single support position, min guard due to fear of falling.  Forward weight shifting training with stepping over an obstacle and weigh shifting on front leg with min A for balance, increased time needed for initiation, verbal cues for posture.  Standing on foam surface and reaching cross body on both sides to increase dynamic standing balance.  Training in maneuvering up and down stairs with reciprocal manner, both side rails, 2 x stair case both 6 and 3 inch steps. Seated rest break provided.  With RW back to room about half way from gym, complains of being tired, rolled back to room in w/c with using B LE for propulsion.  At the end of session patient asked to recall what he did, not able to remember over 50% of activities. Education on safety ,questionable carry over.  Left sitting in room with all needs within reach and QR belt on.   Therapy Documentation Precautions:   Precautions Precautions: Fall Restrictions Weight Bearing Restrictions: No    See Function Navigator for Current Functional Status.   Therapy/Group: Individual Therapy  Dorna Mai 07/21/2015, 9:10 AM

## 2015-07-21 NOTE — Progress Notes (Signed)
[place] PHYSICAL MEDICINE & REHABILITATION     PROGRESS NOTE    Subjective/Complaints: Working with therapy. No problems overnight.  ROS limited by cognition and behavior, but appears to deny CP, SOB, N/V/D.   Objective: Vital Signs: Blood pressure 101/57, pulse 61, temperature 97.7 F (36.5 C), temperature source Oral, resp. rate 18, height  (1.803 m), weight 76.476 kg (168 lb 9.6 oz), SpO2 100 %. No results found.  Recent Labs  07/21/15 0531  WBC 7.5  HGB 11.0*  HCT 33.6*  PLT 341    Recent Labs  07/19/15 0544  NA 139  K 4.4  CL 107  GLUCOSE 122*  BUN 11  CREATININE 0.87  CALCIUM 9.5   CBG (last 3)   Recent Labs  07/20/15 1634 07/20/15 2031 07/21/15 0729  GLUCAP 102* 113* 121*    Wt Readings from Last 3 Encounters:  07/18/15 76.476 kg (168 lb 9.6 oz)  06/13/15 89.6 kg (197 lb 8.5 oz)  06/08/15 91.627 kg (202 lb)    Physical Exam:  Constitutional: He appears well-developed and well-nourished. NAD. Vital signs reviewed. HENT: Normocephalic. Scalp clean and dry Eyes: Conjunctivae and EOM are normal.  Cardiovascular: Normal rate and regular rhythm.  Respiratory: Effort normal and breath sounds normal. No stridor. No respiratory distress. He has no wheezes.  GI: Soft. Bowel sounds are normal. He exhibits no distension. There is no tenderness.  Musculoskeletal: + Tenderness right lateral thorax with morvement and palpation. No bruising. No erythema, edema, warm Neurological: He is alert.  A&Ox2.   Slow to process and initiate still  Still Lacks insight and awareness of deficits but slight improvement.    Motor: 4+/5 grossly throughout. Ataxia RUE. Skin: Skin is warm and dry.  Psychiatric: Cognition and memory are impaired. He expresses inappropriate judgment. He remains inattentive.   Assessment/Plan:  1. Cognitive, swallowing, and mobility deficits secondary to [facility] which require 3+ hours per day of interdisciplinary therapy in a  comprehensive inpatient rehab setting. Physiatrist is providing close team supervision and 24 hour management of active medical problems listed below. Physiatrist and rehab team continue to assess barriers to discharge/monitor patient progress toward functional and medical goals.  Function:  Bathing Bathing position   Position: Shower  Bathing parts Body parts bathed by patient: Right arm, Left arm, Chest, Abdomen, Front perineal area, Buttocks, Right upper leg, Left upper leg, Right lower leg, Left lower leg Body parts bathed by helper: Back  Bathing assist Assist Level: Supervision or verbal cues      Upper Body Dressing/Undressing Upper body dressing   What is the patient wearing?: Pull over shirt/dress     Pull over shirt/dress - Perfomed by patient: Thread/unthread right sleeve, Thread/unthread left sleeve, Put head through opening, Pull shirt over trunk Pull over shirt/dress - Perfomed by helper: Pull shirt over trunk        Upper body assist Assist Level: Set up, Supervision or verbal cues   Set up : To obtain clothing/put away  Lower Body Dressing/Undressing Lower body dressing Lower body dressing/undressing activity did not occur: Refused What is the patient wearing?: Pants, [employer], Shoes Underwear - Performed by patient: Thread/unthread right underwear leg, Thread/unthread left underwear leg, Pull underwear up/down Underwear - Performed by helper: Thread/unthread right underwear leg, Thread/unthread left underwear leg, Pull underwear up/down Pants- Performed by patient: Thread/unthread right pants leg, Thread/unthread left pants leg, Pull pants up/down Pants- Performed by helper: Pull pants up/down Non-skid slipper socks- Performed by patient: Don/doff right sock,  Don/doff left sock Non-skid slipper socks- Performed by helper: Don/doff right sock, Don/doff left sock     Shoes - Performed by patient: Don/doff right shoe, Don/doff left shoe, Fasten right, Fasten  left Shoes - Performed by helper: Fasten right, Fasten left     TED Hose - Performed by patient: Don/doff right TED hose, Don/doff left TED hose TED Hose - Performed by helper: Don/doff right TED hose, Don/doff left TED hose  Lower body assist Assist for lower body dressing: Supervision or verbal cues      Toileting Toileting Toileting activity did not occur: N/A Toileting steps completed by patient: Adjust clothing prior to toileting, Performs perineal hygiene, Adjust clothing after toileting Toileting steps completed by helper: Adjust clothing after toileting Toileting Assistive Devices: Grab bar or rail  Toileting assist Assist level: Supervision or verbal cues   Transfers Chair/bed transfer   Chair/bed transfer method: Ambulatory Chair/bed transfer assist level: Supervision or verbal cues Chair/bed transfer assistive device: Armrests, Patent attorney Ambulation activity did not occur: Refused   Max distance: 125 Assist level: Supervision or verbal cues   Wheelchair   Type: Manual Max wheelchair distance: 150 Assist Level: Supervision or verbal cues  Cognition Comprehension Comprehension assist level: Understands basic 75 - 89% of the time/ requires cueing 10 - 24% of the time  Expression Expression assist level: Expresses basic 90% of the time/requires cueing < 10% of the time.  Social Interaction Social Interaction assist level: Interacts appropriately 50 - 74% of the time - May be physically or verbally inappropriate.  Problem Solving Problem solving assist level: Solves basic 75 - 89% of the time/requires cueing 10 - 24% of the time  Memory Memory assist level: Recognizes or recalls 25 - 49% of the time/requires cueing 50 - 75% of the time   Medical Problem List and Plan: 1. 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   -begin family ed 2.  LLE DVT: eliquis. No bleeding sequelae 3. Pain Management:  oxycodone prn for headaches and back/leg pain.  4. Mood: LCSW to follow for evaluation and support.  5. Neuropsych: This patient is not capable of making decisions on his own behalf.  -continue to provide appropriate environment for sleep and communication  -environmental mod  -continue sleep chart  -continue ritalin to 15 mg bid to augment attention/processing 6. Skin/Wound Care: routine pressure relief measures.  7. Fluids/Electrolytes/Nutrition: intake inconsistent. Added nutritional supplements. Monitor for now.  .  -encourage PO 8. HTN: improved control   -resumed low dose Cozaar with good control    9. DM type 2:   -continue to hold metformin and use SSI for now. sugars remain under control but intake is poor  -Megace helpful for appetite    -sugars generally between 100-130 10. Seizures disorder: Was on lamictal bid prior to admission. On keppra PO bid currently 11. ABLA: continue iron supplement..  12. Mild ileus:   two Senna S bid. Use enema prn.  13. Hypokalemia: Supplement to avoid recurrence of ileus.  14. Leukocytosis: Resolved  ua neg, cx with multispecies  -remains afebrile  15. Chronic right knee pain/lower ext pain: improving   -voltaren gel, prn ICE 16. PVCs: Negative cardiac work up 05/2015 17. Safety:  continue  low bed    -room next to RN station  -continue close observation from staff.  -has been a little better with safety awareness lately  LOS (Days) 24 A FACE TO FACE EVALUATION WAS PERFORMED  Kandis Henry T 07/21/2015 9:01 AM

## 2015-07-22 ENCOUNTER — Inpatient Hospital Stay (HOSPITAL_COMMUNITY): Payer: BC Managed Care – PPO | Admitting: Occupational Therapy

## 2015-07-22 LAB — GLUCOSE, CAPILLARY
Glucose-Capillary: 114 mg/dL — ABNORMAL HIGH (ref 65–99)
Glucose-Capillary: 115 mg/dL — ABNORMAL HIGH (ref 65–99)
Glucose-Capillary: 142 mg/dL — ABNORMAL HIGH (ref 65–99)
Glucose-Capillary: 119 mg/dL — ABNORMAL HIGH (ref 65–99)

## 2015-07-22 NOTE — Progress Notes (Signed)
Occupational Therapy Session Note  Patient Details  Name: Ruford Dudzinski MRN: [id number] Date of Birth: Jan 14, 1951  Today's Date: 07/22/2015 OT Individual Time:  - 1100-1215  (75 min)      Short Term Goals: Week 1:  OT Short Term Goal 1 (Week 1): Pt will complete all bathing with min assist sit to stand. OT Short Term Goal 1 - Progress (Week 1): Progressing toward goal OT Short Term Goal 2 (Week 1): Pt will complete LB dressing with min assist sit to stand with AE PRN. OT Short Term Goal 2 - Progress (Week 1): Progressing toward goal OT Short Term Goal 3 (Week 1): Pt will transfer to the walk-in shower with min assist using the RW and shower seat.  OT Short Term Goal 3 - Progress (Week 1): Progressing toward goal OT Short Term Goal 4 (Week 1): Pt will complete toilet transfer with min assist to 3:1 OT Short Term Goal 4 - Progress (Week 1): Progressing toward goal OT Short Term Goal 5 (Week 1): Pt will demonstrate anticipatory awareness by waiting for assistance prior to completing any sit to stand transtions or transfers.  OT Short Term Goal 5 - Progress (Week 1): Met Week 2:  OT Short Term Goal 1 (Week 2): Pt will transfer to 3 in 1 over toilet with min A. OT Short Term Goal 1 - Progress (Week 2): Met OT Short Term Goal 2 (Week 2): Pt will transfer to shower seat with min A. OT Short Term Goal 2 - Progress (Week 2): Partly met OT Short Term Goal 3 (Week 2): Pt will sit to stand with min A to prepare for LB self care. OT Short Term Goal 3 - Progress (Week 2): Met OT Short Term Goal 4 (Week 2): Pt will manage clothing over hips for toileting tasks. OT Short Term Goal 4 - Progress (Week 2): Met  Skilled Therapeutic Interventions/Progress Updates:    Pt. Sitting in wc upon OT arrival.  Donned footies and tennis shoes with SET up.  Engaged in functional mobility,strength, endurance and problem solving activities>  Pt. Ambulated to day room and ambulated for 5 minutes while watering flowers.  Repeated x2 for 5 minutes with rest breaks in between.   Engaged in Insurance account manager names with pt naming 90 % independently.  Pt.able to name 7/10 pictures on the figure ground MOCA.   Ambulated back to room with right knee buckle x1.   Left in room with brother. Therapy Documentation Precautions:  Precautions Precautions: Fall Restrictions Weight Bearing Restrictions: No General:   Vital Signs: Therapy Vitals Temp: 98.4 F (36.9 C) Temp Source: Oral Pulse Rate: 61 Resp: 17 BP: 100/66 mmHg Patient Position (if appropriate): Lying Oxygen Therapy SpO2: 99 % O2 Device: Not Delivered Pain: Pain Assessment Pain Assessment: 0-10 Pain Score: 6 Pain Type: Acute pain Pain Location: right knee and rib cage Pain Orientation: Right Pain Descriptors / Indicators: Aching Pain Frequency: Intermittent Patients Stated Pain Goal: 2 Pain Intervention(s): Medication (See eMAR)           See Function Navigator for Current Functional Status.   Therapy/Group: Individual Therapy  Lisa Roca 07/22/2015, 7:25 AM

## 2015-07-22 NOTE — Progress Notes (Signed)
[place] PHYSICAL MEDICINE & REHABILITATION     PROGRESS NOTE    Subjective/Complaints: No new issues. Just getting up  ROS limited by cognition and behavior, but appears to deny CP, SOB, N/V/D.   Objective: Vital Signs: Blood pressure 100/66, pulse 61, temperature 98.4 F (36.9 C), temperature source Oral, resp. rate 17, height  (1.803 m), weight 76.476 kg (168 lb 9.6 oz), SpO2 99 %. No results found.  Recent Labs  07/21/15 0531  WBC 7.5  HGB 11.0*  HCT 33.6*  PLT 341   No results for input(s): NA, K, CL, GLUCOSE, BUN, CREATININE, CALCIUM in the last 72 hours.  Invalid input(s): CO CBG (last 3)   Recent Labs  07/21/15 1632 07/21/15 2103 07/22/15 0629  GLUCAP 110* 275* 114*    Wt Readings from Last 3 Encounters:  07/18/15 76.476 kg (168 lb 9.6 oz)  06/13/15 89.6 kg (197 lb 8.5 oz)  06/08/15 91.627 kg (202 lb)    Physical Exam:  Constitutional: He appears well-developed and well-nourished. NAD. Vital signs reviewed. HENT: Normocephalic. Scalp clean and dry Eyes: Conjunctivae and EOM are normal.  Cardiovascular: Normal rate and regular rhythm.  Respiratory: Effort normal and breath sounds normal. No stridor. No respiratory distress. He has no wheezes.  GI: Soft. Bowel sounds are normal. He exhibits no distension. There is no tenderness.  Musculoskeletal: + Tenderness right lateral thorax with morvement and palpation. No bruising. No erythema, edema, warm Neurological: He is alert.  A&Ox2.   Slow to process and initiate still  Still Lacks insight and awareness of deficits but slight improvement.    Motor: 4+/5 grossly throughout. Ataxia RUE. Skin: Skin is warm and dry.  Psychiatric: Cognition and memory are impaired. He expresses inappropriate judgment. He remains inattentive.   Assessment/Plan:  1. Cognitive, swallowing, and mobility deficits secondary to [facility] which require 3+ hours per day of interdisciplinary therapy in a comprehensive inpatient  rehab setting. Physiatrist is providing close team supervision and 24 hour management of active medical problems listed below. Physiatrist and rehab team continue to assess barriers to discharge/monitor patient progress toward functional and medical goals.  Function:  Bathing Bathing position   Position: Shower  Bathing parts Body parts bathed by patient: Right arm, Left arm, Chest, Abdomen, Front perineal area, Buttocks, Right upper leg, Left upper leg, Right lower leg, Left lower leg Body parts bathed by helper: Back  Bathing assist Assist Level: Supervision or verbal cues      Upper Body Dressing/Undressing Upper body dressing   What is the patient wearing?: Pull over shirt/dress     Pull over shirt/dress - Perfomed by patient: Thread/unthread right sleeve, Thread/unthread left sleeve, Put head through opening, Pull shirt over trunk Pull over shirt/dress - Perfomed by helper: Pull shirt over trunk        Upper body assist Assist Level: Set up, Supervision or verbal cues   Set up : To obtain clothing/put away  Lower Body Dressing/Undressing Lower body dressing Lower body dressing/undressing activity did not occur: Refused What is the patient wearing?: Pants, [employer], Shoes Underwear - Performed by patient: Thread/unthread right underwear leg, Thread/unthread left underwear leg, Pull underwear up/down Underwear - Performed by helper: Thread/unthread right underwear leg, Thread/unthread left underwear leg, Pull underwear up/down Pants- Performed by patient: Thread/unthread right pants leg, Thread/unthread left pants leg, Pull pants up/down Pants- Performed by helper: Pull pants up/down Non-skid slipper socks- Performed by patient: Don/doff right sock, Don/doff left sock Non-skid slipper socks- Performed by helper:  Don/doff right sock, Don/doff left sock     Shoes - Performed by patient: Don/doff right shoe, Don/doff left shoe, Fasten right, Fasten left Shoes - Performed by  helper: Fasten right, Fasten left     TED Hose - Performed by patient: Don/doff right TED hose, Don/doff left TED hose TED Hose - Performed by helper: Don/doff right TED hose, Don/doff left TED hose  Lower body assist Assist for lower body dressing: Supervision or verbal cues      Toileting Toileting Toileting activity did not occur: N/A Toileting steps completed by patient: Adjust clothing prior to toileting, Performs perineal hygiene, Adjust clothing after toileting Toileting steps completed by helper: Adjust clothing after toileting Toileting Assistive Devices: Grab bar or rail  Toileting assist Assist level: Supervision or verbal cues   Transfers Chair/bed transfer   Chair/bed transfer method: Ambulatory Chair/bed transfer assist level: Supervision or verbal cues Chair/bed transfer assistive device: Armrests, Walker     Locomotion Ambulation Ambulation activity did not occur:Patent attorneyAssist level: Supervision or verbal cues   Wheelchair   Type: Manual Max wheelchair distance: 150 Assist Level: Supervision or verbal cues  Cognition Comprehension Comprehension assist level: Understands basic 75 - 89% of the time/ requires cueing 10 - 24% of the time  Expression Expression assist level: Expresses basic 90% of the time/requires cueing < 10% of the time.  Social Interaction Social Interaction assist level: Interacts appropriately 50 - 74% of the time - May be physically or verbally inappropriate.  Problem Solving Problem solving assist level: Solves basic 75 - 89% of the time/requires cueing 10 - 24% of the time  Memory Memory assist level: Recognizes or recalls 25 - 49% of the time/requires cueing 50 - 75% of the time   Medical Problem List and Plan: 1. 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   -begin family ed 2. LLE DVT: eliquis. No bleeding  sequelae 3. Pain Management:  oxycodone prn for headaches and back/leg pain.  4. Mood: LCSW to follow for evaluation and support.  5. Neuropsych: This patient is not capable of making decisions on his own behalf.  -continue to provide appropriate environment for sleep and communication  -environmental mod  -continue sleep chart  -continue ritalin to 15 mg bid to augment attention/processing 6. Skin/Wound Care: routine pressure relief measures.  7. Fluids/Electrolytes/Nutrition: intake inconsistent. Added nutritional supplements. Monitor for now.  .  -encourage PO 8. HTN: improved control   -resumed low dose Cozaar with good control    9. DM type 2:   -continue to hold metformin and use SSI for now. sugars remain under control but intake is poor  -Megace helpful for appetite    -sugars generally between 100-130 (one aberration yesterday) 10. Seizures disorder: Was on lamictal bid prior to admission. On keppra PO bid currently 11. ABLA: continue iron supplement..  12. Mild ileus:   two Senna S bid. Use enema prn.  13. Hypokalemia: Supplement to avoid recurrence of ileus.  14. Leukocytosis: Resolved  ua neg, cx with multispecies  -remains afebrile  15. Chronic right knee pain/lower ext pain: improving   -voltaren gel, prn ICE 16. PVCs: Negative cardiac work up 05/2015 17. Safety:  continue  low bed    -room next to RN station  -continue close observation from staff.     LOS (Days) 25 A FACE TO FACE EVALUATION WAS PERFORMED  SWARTZ,ZACHARY  T 07/22/2015 8:39 AM

## 2015-07-23 ENCOUNTER — Ambulatory Visit (HOSPITAL_COMMUNITY): Payer: BC Managed Care – PPO | Admitting: Physical Therapy

## 2015-07-23 ENCOUNTER — Inpatient Hospital Stay (HOSPITAL_COMMUNITY): Payer: BC Managed Care – PPO

## 2015-07-23 ENCOUNTER — Inpatient Hospital Stay (HOSPITAL_COMMUNITY): Payer: BC Managed Care – PPO | Admitting: Speech Pathology

## 2015-07-23 LAB — GLUCOSE, CAPILLARY
Glucose-Capillary: 112 mg/dL — ABNORMAL HIGH (ref 65–99)
Glucose-Capillary: 122 mg/dL — ABNORMAL HIGH (ref 65–99)
Glucose-Capillary: 102 mg/dL — ABNORMAL HIGH (ref 65–99)
Glucose-Capillary: 143 mg/dL — ABNORMAL HIGH (ref 65–99)

## 2015-07-23 MED ORDER — PRO-STAT SUGAR FREE PO LIQD
30.0000 mL | Freq: Two times a day (BID) | ORAL | Status: DC
  Administered 2015-07-24: 30 mL via ORAL
  Filled 2015-07-23: qty 30

## 2015-07-23 NOTE — Progress Notes (Signed)
Occupational Therapy Discharge Summary  Patient Details  Name: Darryl [name] MRN: [id number] Date of Birth: [birth year]   Patient has met 49 of 11 long term goals due to improved activity tolerance, improved balance, postural control, ability to compensate for deficits, improved attention, improved awareness and improved coordination.  Pt made steady progress with BADLs during this admission and performs all BADLs with supervision/mod I. Pt completed simple kitchen task with supervision.  Pt continues to require min verbal cues for recall of daily activities.  Pt requires min verbal cues for safety awareness during BADLs. Family (wife, 2 daughters, and son-in-law) has been present for family education and has verblzied understanding of all recommendations. Patient to discharge at overall Supervision level.  Patient's care partner is independent to provide the necessary physical and cognitive assistance at discharge.    Reasons goals not met: Pt still requires min A for day to day recall and demonstrate carryover. Goal was at supervision.  Recommendation:  Patient will benefit from ongoing skilled OT services in home health setting to continue to advance functional skills in the area of BADL, iADL and Reduce care partner burden.  Equipment: BSC, shower seat  Reasons for discharge: treatment goals met and discharge from hospital  Patient/family agrees with progress made and goals achieved: Yes  OT Discharge   Vision/Perception  Vision- History Baseline Vision/History: Wears glasses Wears Glasses: At all times Patient Visual Report: No change from baseline Vision- Assessment Vision Assessment?: No apparent visual deficits Eye Alignment: Within Functional Limits Ocular Range of Motion: Within Functional Limits Tracking/Visual Pursuits: Able to track stimulus in all quads without difficulty Visual Fields: No apparent deficits  Cognition Overall Cognitive Status: Impaired/Different from  baseline Arousal/Alertness: Awake/alert Orientation Level: Oriented X4 Attention: Selective Focused Attention: Appears intact Sustained Attention: Appears intact Sustained Attention Impairment: Verbal basic;Functional basic Memory: Impaired Memory Impairment: Decreased short term memory;Decreased recall of new information;Retrieval deficit Awareness: Impaired Awareness Impairment: Emergent impairment Problem Solving: Impaired Problem Solving Impairment: Functional basic Behaviors: Impulsive Safety/Judgment: Impaired Sensation Sensation Light Touch: Appears Intact Stereognosis: Not tested Hot/Cold: Appears Intact Proprioception: Appears Intact Coordination Gross Motor Movements are Fluid and Coordinated: Yes Fine Motor Movements are Fluid and Coordinated: Yes Motor  Motor Motor: Within Functional Limits Motor - Discharge Observations: Generalized weakness, pain Mobility  Bed Mobility Bed Mobility: Supine to Sit;Sit to Supine Supine to Sit: 6: Modified independent (Device/Increase time) Sit to Supine: 6: Modified independent (Device/Increase time) Transfers Sit to Stand: 5: Supervision Stand to Sit: 5: Supervision  Trunk/Postural Assessment  Cervical Assessment Cervical Assessment: Within Functional Limits Thoracic Assessment Thoracic Assessment: Exceptions to [facility] (kyphotic) Lumbar Assessment Lumbar Assessment: Exceptions to [facility] (posterior pelvic tilt) Postural Control Postural Control:  (impaired/delayed balance reactions in standing) Protective Responses: impaired/delayed  Balance Static Sitting Balance Static Sitting - Balance Support: Feet supported Static Sitting - Level of Assistance: 6: Modified independent (Device/Increase time) Dynamic Sitting Balance Dynamic Sitting - Balance Support: Feet supported Dynamic Sitting - Level of Assistance: 6: Modified independent (Device/Increase time) Static Standing Balance Static Standing - Balance Support: Right upper  extremity supported;Left upper extremity supported Static Standing - Level of Assistance: 5: Stand by assistance Dynamic Standing Balance Dynamic Standing - Balance Support: Right upper extremity supported;Left upper extremity supported Dynamic Standing - Level of Assistance: 5: Stand by assistance Extremity/Trunk Assessment RUE Assessment RUE Assessment: Within Functional Limits LUE Assessment LUE Assessment: Within Functional Limits   See Function Navigator for Current Functional Status.  [name] [facility] 07/23/2015, 3:43 PM

## 2015-07-23 NOTE — Progress Notes (Signed)
Occupational Therapy Session Note  Patient Details  Name: Darryl [name] MRN: [id number] Date of Birth: [birth year]  Today's Date: 07/23/2015 OT Individual Time: 1300-1330 OT Individual Time Calculation (min): 30 min    Short Term Goals: Week 4:  OT Short Term Goal 1 (Week 4): STG=LTG secondary to ELOS  Skilled Therapeutic Interventions/Progress Updates:    Pt resting in w/c upon arrival.  Pt engaged in BADL retraining including bathing at shower level and dressing with sit<>stand from EOB.  Pt amb to and from bathroom with RW.  Pt engaged in toileting tasks prior to completing bathing tasks at supervision level.  Pt did not require assistance for dressing tasks.  Pt completed grooming tasks standing at sink.  Pt returned to bed upon completion and remained in bed with all needs within reach.  Focus on BADL retraining, continued discharge planning, sit<>stand, standing balance, functional amb with RW, and safety awareness. Pt pleased with progress during this admission and, although anxious, ready for discharge home with family in [place].  Therapy Documentation Precautions:  Precautions Precautions: Fall Restrictions Weight Bearing Restrictions: No   Pain: Pain Assessment Pain Score: 6  Pain Type: Acute pain;Chronic pain Pain Location: Knee Pain Orientation: Right Pain Descriptors / Indicators: Aching;Sore Pain Onset: Gradual Pain Intervention(s): RN made aware  See Function Navigator for Current Functional Status.   Therapy/Group: Individual Therapy  Rich Brave 07/23/2015, 1:34 PM

## 2015-07-23 NOTE — Progress Notes (Signed)
Physical Therapy Session Note  Patient Details  Name: Darryl [name] MRN: [id number] Date of Birth: 14-Jul-1950  Today's Date: 07/23/2015 PT Individual Time: 1100-1210 PT Individual Time Calculation (min): 70 min   Short Term Goals: Week 3:  PT Short Term Goal 1 (Week 3): Patient will ambulate 150 ft using RW with min A.  PT Short Term Goal 1 - Progress (Week 3): Met PT Short Term Goal 2 (Week 3): Patient will negotiate up/down 10 stairs using 1 rail with min A.  PT Short Term Goal 2 - Progress (Week 3): Met PT Short Term Goal 3 (Week 3): Patient will be oriented to time, place, and situation with mod verbal cues.  PT Short Term Goal 3 - Progress (Week 3): Met  Skilled Therapeutic Interventions/Progress Updates:   Pt received in w/c with wife and daughters present for education.  Pt received equipment from [employer]; w/c and RW set up to patient height specifications and family educated on break down and set up of equipment.  Rest of session focused on education on basic transfers, car transfers, gait with RW and stair negotiation as documented below.  Pt did require one prolonged seated rest break due to feeling shaky and lightheaded.  Vitals assessed: 112/73, HR: 74 seated >>> 94/68, HR: 87 standing.  Pt provided with D2 snack and orange juice.  Following stair negotiation pt too fatigued and reporting increased pain in R knee; pt rested in w/c while PT demonstrated safe floor > furniture transfer sequence and indications for calling EMS; pt and family verbalized understanding.  Pt returned to room and left in w/c with all items within reach and family present to supervise.  Pt continues to be anxious about stair negotiation for home; scheduled pt for short 30 min session with primary PT for Tuesday am to review stair negotiation one more time before D/C.     Therapy Documentation Precautions:  Precautions Precautions: Fall Restrictions Weight Bearing Restrictions: No Pain: Pain  Assessment Pain Assessment: No/denies pain Pain Score: 6  Pain Type: Acute pain;Chronic pain Pain Location: Knee Pain Orientation: Right Pain Descriptors / Indicators: Aching;Sore Pain Onset: Gradual Pain Intervention(s): RN made aware Mobility: Bed Mobility Bed Mobility: Supine to Sit;Sit to Supine Supine to Sit: 6: Modified independent (Device/Increase time) Sit to Supine: 6: Modified independent (Device/Increase time) Transfers Transfers: Yes Sit to Stand: 5: Supervision Stand to Sit: 5: Supervision Stand Pivot Transfers: 5: Supervision;With armrests Stand Pivot Transfer Details: Verbal cues for technique;Verbal cues for precautions/safety Stand Pivot Transfer Details (indicate cue type and reason): Pt performed stand pivot transfers w/c <> bed/mat and w/c <> simulated car with supervision and UE support on RW with verbal cues for safety, sequencing and for safe hand placement when performing sit <>stand  Locomotion : Ambulation Ambulation/Gait Assistance: 5: Supervision Ambulation Distance (Feet): 150 Feet Assistive device: Rolling walker Gait Gait Pattern: Step-through pattern;Decreased stance time - right;Antalgic;Trunk flexed Stairs / Additional Locomotion Stairs: Yes Stairs Assistance: 5: Supervision Stairs Assistance Details: Verbal cues for technique;Visual cues/gestures for sequencing;Verbal cues for precautions/safety;Verbal cues for gait pattern Stairs Assistance Details (indicate cue type and reason): Performed stair negotiation to simulate daughter's home entry: 3 STE with R rail and then full flight of stairs to access main level bathroom with 7 + 7 with L rail; demonstrated and verbalized safe sequence to pt and family with pt and daughter return demonstrating sequence with pt ascending leading with LLE and descending with RLE due to R knee pain.  Discussed sequence of having  RW at top and bottom of stairs first and then providing side by side supervision to patient  during negotiation.Pt very anxious about stair negotiation and requesting one more practice session prior to D/C on Tuesday.   Stair Management Technique: One rail Right;One rail Left;Step to pattern;Forwards Number of Stairs: 12 Height of Stairs: 7 Architect: Yes Wheelchair Assistance: 5: Careers information officer: Both upper extremities Wheelchair Parts Management: Supervision/cueing Distance: 150'; demonstrated to family how to break down w/c for storage in car and set up for transportation of patient.    Trunk/Postural Assessment : Cervical Assessment Cervical Assessment: Within Functional Limits Thoracic Assessment Thoracic Assessment: Exceptions to [facility] (same as eval; kyphotic) Lumbar Assessment Lumbar Assessment: Exceptions to [facility] (same as eval; posterior tilt) Postural Control Postural Control:  (impaired/delayed balance reactions in standing)  Balance: Static Sitting Balance Static Sitting - Balance Support: No upper extremity supported;Feet supported Static Sitting - Level of Assistance: 6: Modified independent (Device/Increase time) Dynamic Sitting Balance Dynamic Sitting - Balance Support: No upper extremity supported;Feet supported Dynamic Sitting - Level of Assistance: 6: Modified independent (Device/Increase time) Static Standing Balance Static Standing - Balance Support: Right upper extremity supported;Left upper extremity supported Static Standing - Level of Assistance: 5: Stand by assistance Dynamic Standing Balance Dynamic Standing - Balance Support: Right upper extremity supported;Left upper extremity supported Dynamic Standing - Level of Assistance: 5: Stand by assistance  See Function Navigator for Current Functional Status.   Therapy/Group: Individual Therapy  Raylene Everts [facility] 07/23/2015, 12:39 PM

## 2015-07-23 NOTE — Progress Notes (Signed)
Discharge summary [phone] dictated.

## 2015-07-23 NOTE — Progress Notes (Signed)
Speech Language Pathology Daily Session Notes  Patient Details  Name: Arvle Grabe MRN: [id number] Date of Birth: 1951/01/01  Today's Date: 07/23/2015  Session 1: SLP Individual Time: 7829-5621 SLP Individual Time Calculation (min): 45 min    Session 2: SLP Individual Time: 1345-1400 SLP Individual Time Calculation (min): 15 min  Short Term Goals: Week 4: SLP Short Term Goal 1 (Week 4): STG=LTG due to remaining length of stay.   Skilled Therapeutic Interventions:  Session 1: Skilled treatment session focused on completion of patient and family education with the patient's 2 daughters and wife.  All were educated in regards to patient's current swallowing function, diet recommendations, appropriate textures and swallowing compensatory strategies. All verbalized understanding and handouts were also given to reinforce information.  SLP also provided education in regards to patient's current cognitive function, need for 24 hour supervision, strategies to utilize at home to maximize attention, safety and recall and activities/tasks patient can participate in to maximize cognitive function.  All verbalized understanding of all information and asked appropriate questions. Handouts were also given to reinforce information. Patient left upright in wheelchair with family present.  Continue with current plan of care.   Session 2: Skilled treatment session focused on cognitive goals. SLP facilitated session by re-administering the MoCA-BLIND. Patient scored 16/22 points with a score of 18 or above considered normal compared to initial assessment on 06/29/15 in which patient scored 11/22 points. Patient continues to demonstrate deficits in short-term recall and attention. Patient left supine in bed with alarm on and visitor present. Continue with current plan of care.   Function:  Eating Eating   Modified Consistency Diet: Yes Eating Assist Level: Supervision or verbal cues            Cognition Comprehension Comprehension assist level: Understands basic 75 - 89% of the time/ requires cueing 10 - 24% of the time  Expression   Expression assist level: Expresses basic 90% of the time/requires cueing < 10% of the time.  Social Interaction Social Interaction assist level: Interacts appropriately 75 - 89% of the time - Needs redirection for appropriate language or to initiate interaction.  Problem Solving Problem solving assist level: Solves basic 75 - 89% of the time/requires cueing 10 - 24% of the time  Memory Memory assist level: Recognizes or recalls 25 - 49% of the time/requires cueing 50 - 75% of the time    Pain Pain Assessment Pain Assessment: No/denies pain   Therapy/Group: Individual Therapy  Deloros Beretta 07/23/2015, 3:56 PM

## 2015-07-23 NOTE — Progress Notes (Signed)
Occupational Therapy Session Note  Patient Details  Name: Aviraj Kentner MRN: [id number] Date of Birth: 1951-05-26  Today's Date: 07/23/2015 OT Individual Time: 0900-1000 OT Individual Time Calculation (min): 60 min    Short Term Goals: Week 4:  OT Short Term Goal 1 (Week 4): STG=LTG secondary to ELOS  Skilled Therapeutic Interventions/Progress Updates:    Pt resting in w/c upon arrival with wife, 2 daughters, and son-in-law present.  Focus on family education, practicing shower transfers, functional amb with RW, and continued discharge planning.  DME delivered during session and education provided.  Family verbalized understanding of recommendation for 24 hour supervision.  Recommended placement of BSC next to bed for use at night.  Family verbalized understanding.  Family pleased with progress during this admission.   Therapy Documentation Precautions:  Precautions Precautions: Fall Restrictions Weight Bearing Restrictions: No   Pain: Pain Assessment Pain Assessment: No/denies pain  See Function Navigator for Current Functional Status.   Therapy/Group: Individual Therapy  Rich Brave 07/23/2015, 12:09 PM

## 2015-07-23 NOTE — Progress Notes (Signed)
[place] PHYSICAL MEDICINE & REHABILITATION     PROGRESS NOTE    Subjective/Complaints: Mild headache/ right flank pain.   ROS limited by cognition and behavior, denies CP, SOB, N/V/D.   Objective: Vital Signs: Blood pressure 97/67, pulse 61, temperature 98.1 F (36.7 C), temperature source Oral, resp. rate 17, height  (1.803 m), weight 76.476 kg (168 lb 9.6 oz), SpO2 100 %. No results found.  Recent Labs  07/21/15 0531  WBC 7.5  HGB 11.0*  HCT 33.6*  PLT 341   No results for input(s): NA, K, CL, GLUCOSE, BUN, CREATININE, CALCIUM in the last 72 hours.  Invalid input(s): CO CBG (last 3)   Recent Labs  07/22/15 1211 07/22/15 1658 07/22/15 2052  GLUCAP 115* 142* 119*    Wt Readings from Last 3 Encounters:  07/18/15 76.476 kg (168 lb 9.6 oz)  06/13/15 89.6 kg (197 lb 8.5 oz)  06/08/15 91.627 kg (202 lb)    Physical Exam:  Constitutional: He appears well-developed and well-nourished. NAD. Vital signs reviewed. HENT: Normocephalic. Scalp clean and dry Eyes: Conjunctivae and EOM are normal.  Cardiovascular: Normal rate and regular rhythm.  Respiratory: Effort normal and breath sounds normal. No stridor. No respiratory distress. He has no wheezes.  GI: Soft. Bowel sounds are normal. He exhibits no distension. There is no tenderness.  Musculoskeletal: + Tenderness right lateral thorax with morvement and palpation. No bruising. No erythema, edema, warm Neurological: He is alert.  A&Ox2.   Slow to process and initiate still  Still Lacks insight and awareness of deficits but slight improvement.    Motor: 4+/5 grossly throughout. Ataxia RUE. Skin: Skin is warm and dry.  Psychiatric: Cognition and memory are impaired. He expresses inappropriate judgment. He remains inattentive.   Assessment/Plan:  1. Cognitive, swallowing, and mobility deficits secondary to [facility] which require 3+ hours per day of interdisciplinary therapy in a comprehensive inpatient rehab  setting. Physiatrist is providing close team supervision and 24 hour management of active medical problems listed below. Physiatrist and rehab team continue to assess barriers to discharge/monitor patient progress toward functional and medical goals.  Function:  Bathing Bathing position   Position: Shower  Bathing parts Body parts bathed by patient: Right arm, Left arm, Chest, Abdomen, Front perineal area, Buttocks, Right upper leg, Left upper leg, Right lower leg, Left lower leg Body parts bathed by helper: Back  Bathing assist Assist Level: Supervision or verbal cues      Upper Body Dressing/Undressing Upper body dressing   What is the patient wearing?: Pull over shirt/dress     Pull over shirt/dress - Perfomed by patient: Thread/unthread right sleeve, Thread/unthread left sleeve, Put head through opening, Pull shirt over trunk Pull over shirt/dress - Perfomed by helper: Pull shirt over trunk        Upper body assist Assist Level: Set up, Supervision or verbal cues   Set up : To obtain clothing/put away  Lower Body Dressing/Undressing Lower body dressing Lower body dressing/undressing activity did not occur: Refused What is the patient wearing?: Pants, [employer], Shoes Underwear - Performed by patient: Thread/unthread right underwear leg, Thread/unthread left underwear leg, Pull underwear up/down Underwear - Performed by helper: Thread/unthread right underwear leg, Thread/unthread left underwear leg, Pull underwear up/down Pants- Performed by patient: Thread/unthread right pants leg, Thread/unthread left pants leg, Pull pants up/down Pants- Performed by helper: Pull pants up/down Non-skid slipper socks- Performed by patient: Don/doff right sock, Don/doff left sock Non-skid slipper socks- Performed by helper: Don/doff right sock,  Don/doff left sock     Shoes - Performed by patient: Don/doff right shoe, Don/doff left shoe, Fasten right, Fasten left Shoes - Performed by helper:  Fasten right, Fasten left     TED Hose - Performed by patient: Don/doff right TED hose, Don/doff left TED hose TED Hose - Performed by helper: Don/doff right TED hose, Don/doff left TED hose  Lower body assist Assist for lower body dressing: Supervision or verbal cues      Toileting Toileting Toileting activity did not occur: N/A Toileting steps completed by patient: Adjust clothing prior to toileting, Performs perineal hygiene, Adjust clothing after toileting Toileting steps completed by helper: Adjust clothing after toileting Toileting Assistive Devices: Grab bar or rail  Toileting assist Assist level: More than reasonable time   Transfers Chair/bed transfer   Chair/bed transfer method: Ambulatory Chair/bed transfer assist level: Supervision or verbal cues Chair/bed transfer assistive device: Armrests, Patent attorney Ambulation activity did not occur: Refused   Max distance: 125 Assist level: Supervision or verbal cues   Wheelchair   Type: Manual Max wheelchair distance: 150 Assist Level: Supervision or verbal cues  Cognition Comprehension Comprehension assist level: Understands basic 75 - 89% of the time/ requires cueing 10 - 24% of the time  Expression Expression assist level: Expresses basic 90% of the time/requires cueing < 10% of the time.  Social Interaction Social Interaction assist level: Interacts appropriately 50 - 74% of the time - May be physically or verbally inappropriate.  Problem Solving Problem solving assist level: Solves basic 75 - 89% of the time/requires cueing 10 - 24% of the time  Memory Memory assist level: Recognizes or recalls 25 - 49% of the time/requires cueing 50 - 75% of the time   Medical Problem List and Plan: 1. 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   -begin family ed 2. LLE DVT: eliquis. No bleeding  sequelae 3. Pain Management:  oxycodone prn for headaches and back/leg pain.  4. Mood: LCSW to follow for evaluation and support.  5. Neuropsych: This patient is not capable of making decisions on his own behalf.  -continue to provide appropriate environment for sleep and communication  -environmental mod  -continue sleep chart  -continue ritalin to 15 mg bid to augment attention/processing 6. Skin/Wound Care: routine pressure relief measures.  7. Fluids/Electrolytes/Nutrition: intake inconsistent. Added nutritional supplements. Monitor for now.  .  -encourage PO 8. HTN: improved control   -resumed low dose Cozaar with good control    9. DM type 2:   -continue to hold metformin and use SSI for now. sugars remain under control but intake is poor  -Megace helpful for appetite    -sugars generally between 100-130  10. Seizures disorder: Was on lamictal bid prior to admission. On keppra PO bid currently 11. ABLA: continue iron supplement..  12. Mild ileus:   two Senna S bid. Use enema prn.  13. Hypokalemia: Supplement to avoid recurrence of ileus.  14. Leukocytosis: Resolved  ua neg, cx with multispecies  -remains afebrile  15. Chronic right knee pain/lower ext pain: improving   -voltaren gel, prn ICE 16. PVCs: Negative cardiac work up 05/2015 17. Safety:  continue  low bed    -room next to RN station  -continue close observation from staff.     LOS (Days) 26 A FACE TO FACE EVALUATION WAS PERFORMED  Ruston Fedora T 07/23/2015 9:02 AM

## 2015-07-23 NOTE — Progress Notes (Signed)
Nutrition Follow-up  DOCUMENTATION CODES:   Not applicable  INTERVENTION:  Discontinue Glucerna Shake.  Provide 30 ml Prostat po BID, each supplement provides 100 kcal and 15 grams of protein.   Encourage adequate PO intake.   NUTRITION DIAGNOSIS:   Inadequate oral intake related to lethargy/confusion as evidenced by meal completion < 50%; improved  GOAL:   Patient will meet greater than or equal to 90% of their needs; met  MONITOR:   PO intake, Supplement acceptance, Diet advancement, Weight trends, Labs, I & O's  REASON FOR ASSESSMENT:    (Supplements)    ASSESSMENT:   65 y.o. male with history fo HTN, DM type 2, PVCs, seizures who was seen in ED 06/10/15 with severe HA but CT head without acute changes. Late that day he had worsening of HA followed by N/V later with unresponsiveness. Patient intubated and sedated in ED. 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 Extubated  1/10  Meal completion has been mostly 75-100%. Pt with varied consumption on Glucerna Shake and Prostat. RD to modify orders to discontinue Gllucerna and provide Prostat BID as meal completion has improved and intake mostly adequate.   Diet Order:  DIET DYS 2 Room service appropriate?: Yes; Fluid consistency:: Thin  Skin:  Reviewed, no issues  Last BM:  2/19  Height:   Ht Readings from Last 1 Encounters:  06/27/15 '5\' 11"'  (1.803 m)    Weight:   Wt Readings from Last 1 Encounters:  07/18/15 168 lb 9.6 oz (76.476 kg)    Ideal Body Weight:  78 kg  BMI:  Body mass index is 23.53 kg/(m^2).  Estimated Nutritional Needs:   Kcal:  2000-2200  Protein:  90-110 grams  Fluid:  2- 2.2 L/day  EDUCATION NEEDS:   No education needs identified at this time  Corrin Parker, MS, RD, LDN Pager # [phone] After hours/ weekend pager # [phone]

## 2015-07-23 NOTE — Plan of Care (Signed)
Problem: RH Memory Goal: LTG Patient will demonstrate ability for day to day (OT) LTG: Patient will demonstrate ability for day to day recall/carryover during activities of daily living with assist (OT)  Outcome: Not Met (add Reason) Darryl [name]

## 2015-07-23 NOTE — Progress Notes (Signed)
Physical Therapy Discharge Summary  Patient Details  Name: Darryl [name] MRN: [id number] Date of Birth: 1950-07-04  Today's Date: 07/23/2015 PT Individual Time: 1100-1210 PT Individual Time Calculation (min): 70 min    Patient has met 12 of 12 long term goals due to improved activity tolerance, improved balance, improved postural control, increased strength, decreased pain, ability to compensate for deficits, functional use of  right lower extremity and left lower extremity, improved attention, improved awareness and improved coordination.  Patient to discharge at an ambulatory level Supervision.   Patient's care partner is independent to provide the necessary cognitive and supervision assistance at discharge.  Reasons goals not met: All goals met  Recommendation:  Patient will benefit from ongoing skilled PT services in home health setting to continue to advance safe functional mobility, address ongoing impairments in cognition and safety with functional mobility, LE weakness, impaired postural control, balance, gait, endurance, and minimize fall risk.  Equipment: RW and manual w/c  Reasons for discharge: treatment goals met and discharge from hospital  Patient/family agrees with progress made and goals achieved: Yes  PT Discharge Precautions/Restrictions Restrictions Weight Bearing Restrictions: No Pain Pain Assessment Pain Assessment: No/denies pain Pain Score: 6  Pain Type: Acute pain;Chronic pain Pain Location: Knee Pain Orientation: Right Pain Descriptors / Indicators: Aching;Sore Pain Onset: Gradual Pain Intervention(s): RN made aware  Cognition Orientation Level: Oriented to person;Oriented to place;Oriented to situation;Disoriented to time Sensation Sensation Light Touch: Appears Intact Stereognosis: Not tested Hot/Cold: Not tested Proprioception: Appears Intact Coordination Gross Motor Movements are Fluid and Coordinated: Not tested Fine Motor Movements are  Fluid and Coordinated: Not tested Motor  Motor Motor: Other (comment) Motor - Discharge Observations: Generalized weakness, pain  Mobility Bed Mobility Bed Mobility: Supine to Sit;Sit to Supine Supine to Sit: 6: Modified independent (Device/Increase time) Sit to Supine: 6: Modified independent (Device/Increase time) Transfers Transfers: Yes Sit to Stand: 5: Supervision Stand to Sit: 5: Supervision Stand Pivot Transfers: 5: Supervision;With armrests Stand Pivot Transfer Details: Verbal cues for technique;Verbal cues for precautions/safety Stand Pivot Transfer Details (indicate cue type and reason): Pt performed stand pivot transfers w/c <> bed/mat and w/c <> simulated car with supervision and UE support on RW with verbal cues for safety, sequencing and for safe hand placement when performing sit <>stand  Locomotion  Ambulation Ambulation/Gait Assistance: 5: Supervision Ambulation Distance (Feet): 150 Feet Assistive device: Rolling walker Gait Gait Pattern: Step-through pattern;Decreased stance time - right;Antalgic;Trunk flexed Stairs / Additional Locomotion Stairs: Yes Stairs Assistance: 5: Supervision Stairs Assistance Details: Verbal cues for technique;Visual cues/gestures for sequencing;Verbal cues for precautions/safety;Verbal cues for gait pattern Stairs Assistance Details (indicate cue type and reason): Performed stair negotiation to simulate daughter's home entry: 3 STE with R rail and then full flight of stairs to access main level bathroom with 7 + 7 with L rail; demonstrated and verbalized safe sequence to pt and family with pt and daughter return demonstrating sequence with pt ascending leading with LLE and descending with RLE due to R knee pain.  Discussed sequence of having RW at top and bottom of stairs first and then providing side by side supervision to patient during negotiation.Pt very anxious about stair negotiation and requesting one more practice session prior to D/C  on Tuesday.   Stair Management Technique: One rail Right;One rail Left;Step to pattern;Forwards Number of Stairs: 12 Height of Stairs: 7 Architect: Yes Wheelchair Assistance: 5: Careers information officer: Both upper extremities Wheelchair Parts Management: Supervision/cueing Distance: 150'; demonstrated to family  how to break down w/c for storage in car and set up for transportation of patient.    Trunk/Postural Assessment  Cervical Assessment Cervical Assessment: Within Functional Limits Thoracic Assessment Thoracic Assessment: Exceptions to [facility] (same as eval; kyphotic) Lumbar Assessment Lumbar Assessment: Exceptions to [facility] (same as eval; posterior tilt) Postural Control Postural Control:  (impaired/delayed balance reactions in standing)  Balance Static Sitting Balance Static Sitting - Balance Support: No upper extremity supported;Feet supported Static Sitting - Level of Assistance: 6: Modified independent (Device/Increase time) Dynamic Sitting Balance Dynamic Sitting - Balance Support: No upper extremity supported;Feet supported Dynamic Sitting - Level of Assistance: 6: Modified independent (Device/Increase time) Static Standing Balance Static Standing - Balance Support: Right upper extremity supported;Left upper extremity supported Static Standing - Level of Assistance: 5: Stand by assistance Dynamic Standing Balance Dynamic Standing - Balance Support: Right upper extremity supported;Left upper extremity supported Dynamic Standing - Level of Assistance: 5: Stand by assistance Extremity Assessment  RLE Assessment RLE Assessment: Exceptions to [facility] RLE Strength RLE Overall Strength: Due to pain RLE Overall Strength Comments: 4-/5 overall with R knee pain LLE Strength LLE Overall Strength Comments: 4+/5 overall   See Function Navigator for Current Functional Status.  Raylene Everts F[place]ette 07/23/2015, 12:37 PM

## 2015-07-24 ENCOUNTER — Inpatient Hospital Stay (HOSPITAL_COMMUNITY): Payer: BC Managed Care – PPO | Admitting: Physical Therapy

## 2015-07-24 LAB — GLUCOSE, CAPILLARY
Glucose-Capillary: 120 mg/dL — ABNORMAL HIGH (ref 65–99)
Glucose-Capillary: 101 mg/dL — ABNORMAL HIGH (ref 65–99)

## 2015-07-24 MED ORDER — POTASSIUM CHLORIDE CRYS ER 20 MEQ PO TBCR: 20.0000 meq | EXTENDED_RELEASE_TABLET | Freq: Every day | ORAL | Status: DC

## 2015-07-24 MED ORDER — LIDOCAINE 5 % EX PTCH: 1.0000 | MEDICATED_PATCH | CUTANEOUS | Status: DC

## 2015-07-24 MED ORDER — OXYCODONE HCL 5 MG PO TABS: 5.0000 mg | ORAL_TABLET | Freq: Four times a day (QID) | ORAL | Status: DC | PRN

## 2015-07-24 MED ORDER — METHYLPHENIDATE HCL 5 MG PO TABS: 15.0000 mg | ORAL_TABLET | Freq: Two times a day (BID) | ORAL | Status: DC

## 2015-07-24 MED ORDER — DICLOFENAC SODIUM 1 % TD GEL: 2.0000 g | Freq: Two times a day (BID) | TRANSDERMAL | Status: DC | PRN

## 2015-07-24 MED ORDER — MEGESTROL ACETATE 400 MG/10ML PO SUSP: 400.0000 mg | Freq: Two times a day (BID) | ORAL | Status: DC

## 2015-07-24 MED ORDER — LOSARTAN POTASSIUM 25 MG PO TABS: 25.0000 mg | ORAL_TABLET | Freq: Every day | ORAL | Status: DC

## 2015-07-24 MED ORDER — METHYLPHENIDATE HCL 10 MG PO TABS: 15.0000 mg | ORAL_TABLET | Freq: Two times a day (BID) | ORAL | Status: DC

## 2015-07-24 MED ORDER — SIMVASTATIN 20 MG PO TABS
20.0000 mg | ORAL_TABLET | Freq: Every day | ORAL | Status: DC
Start: 2015-07-24 — End: 2015-10-08

## 2015-07-24 MED ORDER — RIVAROXABAN 20 MG PO TABS: 20.0000 mg | ORAL_TABLET | Freq: Every day | ORAL | Status: DC

## 2015-07-24 NOTE — Progress Notes (Signed)
Pt. Got d/c instructions and prescriptions.Pt. Ready to go home with his family.

## 2015-07-24 NOTE — Progress Notes (Signed)
Speech Language Pathology Discharge Summary  Patient Details  Name: Darryl [name] MRN: [id number] Date of Birth: March 11, 1951   Patient has met 7 of 6 long term goals.  Patient to discharge at overall Min;Mod level.   Reasons goals not met: Patient continues to require overall Mod-Max A verbal cues for recall of new information with use of external memory aids.    Clinical Impression/Discharge Summary: Patient has made functional gains and has met 6 of 7 LTG's this admission due to improved swallowing and cognitive function. Currently, patient is consuming Dys. 2 textures with thin liquids without overt s/s of aspiration and requires overall supervision verbal cues for use of swallowing compensatory strategies. Patient's overall decreased attention to tasks impact patient's ability to attend to bolus in oral cavity and patient will masticate bolus for more than a reasonable amount of time and requires cues to initiate swallowing before taking another bite. Patient also requires overall Min-Mod A to complete functional and familiar tasks safely in regards to orientation, selective attention, functional problem solving, recall with use of external memory aids, emergent awareness and overall safety awareness. Patient and family education is complete and patient will discharge home with 24 hour supervision from family. Patient would benefit from f/u SLP services to maximize cognitive and swallowing function and overall functional independence to reduce caregiver burden.   Care Partner:  Caregiver Able to Provide Assistance: Yes  Type of Caregiver Assistance: Physical;Cognitive  Recommendation:  Home Health SLP;24 hour supervision/assistance  Rationale for SLP Follow Up: Maximize cognitive function and independence;Reduce caregiver burden;Maximize swallowing safety   Equipment: N/A    Reasons for discharge: Treatment goals met;Discharged from hospital   Patient/Family Agrees with Progress Made and  Goals Achieved: Yes     [place], [place] 07/24/2015, 7:02 AM

## 2015-07-24 NOTE — Progress Notes (Signed)
Social Work  Discharge Note  The overall goal for the admission was met for:   Discharge location: Yes - home with family/  Planning to d/c to daughter's home in [place], [place] so additional family able to assist wife in providing care to pt  Length of Stay: Yes - 27 days  Discharge activity level: Yes - supervision/ min assist  Home/community participation: Yes  Services provided included: MD, RD, PT, OT, SLP, RN, TR, Pharmacy and [place]: Private Insurance: [employer]  Follow-up services arranged: Home Health: RN, PT, OT, ST via [facility] (of [place], [place]), DME: 18x18 lightweight w/c, basic cushion, rolling walker, 3n1 commode and tub seat via [place] and Patient/Family has no preference for HH/DME agencies  Comments (or additional information):  Patient/Family verbalized understanding of follow-up arrangements: Yes  Individual responsible for coordination of the follow-up plan: pt/wife  Confirmed correct DME delivered: Wynelle Dreier 07/24/2015    Barbarajean Kinzler

## 2015-07-24 NOTE — Progress Notes (Signed)
Physical Therapy Session Note  Patient Details  Name: Darryl [name] MRN: [id number] Date of Birth: 27-Feb-1951  Today's Date: 07/24/2015 PT Individual Time: 0930-1000 PT Individual Time Calculation (min): 30 min   Short Term Goals: Week 4:  PT Short Term Goal 1 (Week 4): = LTGs due to anticipated LOS  Skilled Therapeutic Interventions/Progress Updates:   Patient in wheelchair, reporting that he is excited to go home so he can "do things on my own." Reinforced recommendation for 24/7 supervision for safety, patient verbalized understanding. Patient donned shoes in wheelchair and propelled 2 x 150 ft with supervision with cues for path finding to gym. Stair training up/down 12 (6") steps using 1 rail with supervision, patient able to verbalize safe step-to sequencing but required mod cues throughout to adhere to step-to pattern as patient continued to attempt reciprocal pattern. Patient provided OTAG[employer] HEP handout. Patient performed toileting with supervision via stand pivot to/from wheelchair using grab bar and washed hands from wheelchair level. Patient left sitting in wheelchair with quick release belt donned and needs within reach.   Therapy Documentation Precautions:  Precautions Precautions: Fall Restrictions Weight Bearing Restrictions: No Pain: Pain Assessment Pain Assessment: 0-10 Pain Score: 3  Pain Type: Acute pain Pain Location: Knee Pain Orientation: Right Pain Descriptors / Indicators: Aching Pain Onset: On-going Patients Stated Pain Goal: 2 Pain Intervention(s): Repositioned;Rest   See Function Navigator for Current Functional Status.   Therapy/Group: Individual Therapy  Kerney Elbe 07/24/2015, 11:06 AM

## 2015-07-24 NOTE — Discharge Summary (Signed)
[name], [name]                ACCOUNT NO.:  [id number]  MEDICAL RECORD NO.:  [id number]  LOCATION:  [id number]                        FACILITY:  [facility]  PHYSICIAN:  Ranelle Oyster, M.D.DATE OF BIRTH:  1950-09-03  DATE OF ADMISSION:  06/27/2015 DATE OF DISCHARGE:  07/24/2015                              DISCHARGE SUMMARY   DISCHARGE DIAGNOSES: 1. 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 2. New onset Headaches. 3. Hypertension. 4. Diabetes mellitus type 2. 5. Seizure disorder. 6. Acute blood loss anemia. 7. Mild ileus, resolved. 8. Leukocytosis, resolved. 9. Chronic right knee pain.  HISTORY OF PRESENT ILLNESS:  [name] is a 65 year old right- handed male with history of hypertension, DM type 2, PVCs, seizure Disorder; who was seen in the ED on June 10, 2015 with complaints of severe headache.  CT of the head showed no acute changes and he was sent home but he continued to worsen. He developed N/V followed by unresponsiveness later that day. He was sedated and intubated in ED past admission on June 10, 2015.  CT of head showed diffuse subarachnoid hemorrhage felt to be  aneurysmal in nature with developing hydrocephalus and cerebral edema. He was evaluated by Dr. Wynetta Emery and ventriculostomy was placed for management of hydrocephalus.  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  He underwent cerebral angio with coiling of fusiform right V4 aneurysm and sacrifice of right vertebral artery by Dr. Conchita Paris.  He tolerated extubation on 01/10, EVD removed 01/19 and headaches are gradually improving. He continued to have balance deficits with complaints of back pain, poor safety awareness with multiple falls, fluctuating mental status with bouts of confusion. Enclosure bed was ordered for safety and follow CCT 01/22 showed no new changes.  He was started on bowel program to help with abdominal pain and  distension due to ileus. He was showing improvement in activity tolerance and CIR was recommended by For follow up therapy.   PAST MEDICAL HISTORY:  Hypertension, seizures, and DM type 2 without complications.  PAST SURGICAL HISTORY:  History of knee surgery, cholecystectomy.  FAMILY HISTORY:  Positive for Alzheimer disease in mom. Diabetes in father.  SOCIAL HISTORY:  The patient is married. 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  He reports that he quit smoking 27 years ago.  Drinks; has an alcoholic drink a couple of times a years.  He does not use smokeless tobacco or use illicit drugs.  ALLERGIES:  No known drug allergies.   LABORATORY DATA: BMP Latest Ref Rng 07/19/2015 07/12/2015 07/05/2015  Glucose 65 - 99 mg/dL 161(W) 960(A) 540(J)  BUN 6 - 20 mg/dL Creatinine 0.61 - 1.24 mg/dL 8.11 9.14 7.82  Sodium 135 - 145 mmol/L 139 137 140  Potassium 3.5 - 5.1 mmol/L 4.4 4.1 4.6  Chloride 101 - 111 mmol/L 107 102 104  CO2 22 - 32 mmol/L Calcium 8.9 - 10.3 mg/dL 9.5 9.4 9.7    CBC Latest Ref Rng 07/21/2015 07/12/2015 07/05/2015  WBC 4.0 - 10.5 K/uL 7.5 5.4 7.0  Hemoglobin 13.0 - 17.0  g/dL 11.0(L) 12.3(L) 13.2  Hematocrit 39.0 - 52.0 % 33.6(L) 36.5(L) 38.7(L)  Platelets 150 - 400 K/uL 341 314 336       HOSPITAL COURSE:  [name] was admitted to Rehab on June 27, 2015, for inpatient therapies to consist of PT, OT, and speech therapy at least 3 hours a day.  Past admission physiatrist, rehab, RN, and therapy team have worked together to provide customized collaborative interdisciplinary care. Blood pressures were monitored on bid basis and have been relatively controlled on Cozaar daily. He was was started on a bowel program to help manage constipation issues.  He has been seizure free on  Keppra and Lamictal b.i.d.  He was downgraded to dysphagia 1 diet, nectar liquids. He has had poor [email or web address] due to fluctuating mental status as well as food  choices.  Megace was added to help with p.o. Intake and diet has slowly been advanced to dysphagia to thin liquids as mentation has improved. Diabetes has been monitored with ac/hs cbg checks and metformin has been on hold. Blood sugars have been managed with SSI and are currently at 120-140 range. He is to continue monitoring BS bid after discharge and follow up with primary MD for input on renewal of metformin.  Headaches have greatly improved and light sensitivity has resolved. He was started on Ritalin b.i.d. to help with activation, attention and processing. This has been titrated to 15 mg b.i.d. with close monitoring of blood pressure.     Bilateral lower extremity Dopplers were done due to immobility, and this revealed acute thrombus in left proximal femoral and mid distal femoral extending to proximal popliteal vein. Neurosurgery was contacted for input/clearance on initiating anticoagulation.  Dr. Conchita Paris felt that this patient was 3 weeks out from bleed and would be safe to start full.  Xarelto was initiated on 01/27 and he has been  tolerating this without any signs of bleeding.  Lethargy has resolved, and the patient has been making steady progress in his therapy. hronic right knee pain has been managed with the use of Voltaren gel.  He did sustain a fall out of his high low bed and had complaints of right chest wall/ rib pain.  Chest x-ray done showed low volumes with left basilar atelectasis and no fractures are seen.  Lidocaine patch is being used to help with pain likely due to muscle strain v/s costochondritis  During the patient's stay in rehab, weekly team conferences were held to monitor patient's progress, set goals, as well  as discuss barriers to discharge.  At admission, the patient required total assist with basic self-care tasks and mobility, moderate-to-severe cognitive impairments affecting attention, functional problem solving, recall, awareness, as well as safety.  He  was  downgraded to dysphagia 1 with nectar liquids due to delay as well as fluctuating MS. He has had improvement in activity tolerance, balance, postural control, as well as ability to compensate for deficits.  His swallow function has improved and he is tolerating dysphagia 2 diet with thin liquids. He is currently able to complete ADL tasks with supervision and setup. He requires supervision for transfers and to ambulate 125 feet with cues and rolling walker.  He is able to express basic needs with supervision to min assist.  He requires min to moderate assist for functional and familiar tasks, selective attention,sue of memory aids for recall as well as safety awareness.  He has made good progress during his rehab stay and currently requires min assist to supervision.  Family education was done with wife and daughter regarding on need for assistance with all activity due to safety concerns. As well as strategies to help improve attention, safety and recall.  He will continue to receive further followup Home Health PT, OT, ST by [facility] [place] after discharge. On July 24, 2015, the patient was discharged to home in improved condition.  DISCHARGE MEDICATIONS: 1. Voltaren gel 2 g q.i.d. to right knee. 2. Vitamin D 50,000 units weekly. 3. Nutritional supplements b.i.d. 4. Lamictal 100 mg p.o. b.i.d. 5. Keppra 500 mg p.o. b.i.d. 6. Lidocaine patch to right flank on at 7 a.m., off at 7 p.m. 7. Cozaar 25 mg b.i.d. 8. Ritalin 15 mg p.o. at breakfast and at lunch--Rx #95 pills  9. MiraLAX 17 g a day. 10.K-Dur 20 mEq daily. 11.Xarelto 20 mg per day. 12.Zocor 20 mg per day. 13.Oxycodone 5-10 mg p.o. q.6 hours p.r.n. severe pain--RX #60 pills  14. Megace 400 mg po bid. 15. Sennakot-S 2 pills daily at bedtime.   ACTIVITY LEVEL:  As tolerated with 24-hour supervision.  No driving. No strenuous activity.  Monitor blood sugar b.i.d.   SPECIAL INSTRUCTIONS: 1. No strenuous activity. No  driving.  Needs 24 hours supervision. 2. Check blood sugars twice a day before meals. Contact MD if blood sugars are running over 140 consistently. 3. Needs CBC/BMET checked in next 1-2 weeks.     FOLLOWUP: Appointment with Dr. Lanae Boast on 02/28 at 4 pm for  posthospital follow up with recheck of BMET and CBC.  Follow up with Dr. Conchita Paris in the next 2-3 weeks.  Follow up with Dr. Riley Kill in 4 weeks.     Delle Reining, P.A.   ______________________________ Ranelle Oyster, M.D.    PL/MEDQ  D:  07/23/2015  T:  07/23/2015  Job:  [id number]  cc:   Dr. Lisbeth Renshaw Dr. Starleen Arms

## 2015-07-24 NOTE — Progress Notes (Signed)
[facility]IC[facility] & REHABILITATION     PROGRESS NOTE    Subjective/Complaints: Up eating breakfast. No new issues this morning.   ROS remains limited by cognition and behavior, denies CP, SOB, N/V/D.   Objective: Vital Signs: Blood pressure 101/67, pulse 74, temperature 98 F (36.7 C), temperature source Oral, resp. rate 18, height 5\' 11"  (1.803 m), weight 76.476 kg (168 lb 9.6 oz), SpO2 99 %. No results found. No results for input(s): WBC, HGB, HCT, PLT in the last 72 hours. No results for input(s): NA, K, CL, GLUCOSE, BUN, CREATININE, CALCIUM in the last 72 hours.  Invalid input(s): CO CBG (last 3)   Recent Labs  07/23/15 1611 07/23/15 2116 07/24/15 0639  GLUCAP 102* 143* 120*    Wt Readings from Last 3 Encounters:  07/18/15 76.476 kg (168 lb 9.6 oz)  06/13/15 89.6 kg (197 lb 8.5 oz)  06/08/15 91.627 kg (202 lb)    Physical Exam:  Constitutional: He appears well-developed and well-nourished. NAD. Vital signs reviewed. HENT: Normocephalic. Scalp clean and dry Eyes: Conjunctivae and EOM are normal.  Cardiovascular: Normal rate and regular rhythm.  Respiratory: Effort normal and breath sounds normal. No stridor. No respiratory distress. He has no wheezes.  GI: Soft. Bowel sounds are normal. He exhibits no distension. There is no tenderness.  Musculoskeletal: + Tenderness right lateral thorax with morvement and palpation. No bruising. No erythema, edema, warm Neurological: He is alert.  A&Ox2.   Slow to process and initiate still but sl better Still Lacks insight and awareness of deficits but slight improvement.    Motor: 4+/5 grossly throughout. Ataxia RUE. Skin: Skin is warm and dry.  Psychiatric: Cognition and memory are impaired. He expresses inappropriate judgment. He remains inattentive.   Assessment/Plan:  1. Cognitive, swallowing, and mobility deficits secondary to [facility] which require 3+ hours per day of interdisciplinary therapy in a  comprehensive inpatient rehab setting. Physiatrist is providing close team supervision and 24 hour management of active medical problems listed below. Physiatrist and rehab team continue to assess barriers to discharge/monitor patient progress toward functional and medical goals.  Function:  Bathing Bathing position   Position: Shower  Bathing parts Body parts bathed by patient: Right arm, Left arm, Chest, Abdomen, Front perineal area, Buttocks, Right upper leg, Left upper leg, Right lower leg, Left lower leg Body parts bathed by helper: Back  Bathing assist Assist Level: Supervision or verbal cues      Upper Body Dressing/Undressing Upper body dressing   What is the patient wearing?: Pull over shirt/dress     Pull over shirt/dress - Perfomed by patient: Thread/unthread right sleeve, Thread/unthread left sleeve, Put head through opening, Pull shirt over trunk Pull over shirt/dress - Perfomed by helper: Pull shirt over trunk        Upper body assist Assist Level: No help, No cues   Set up : To obtain clothing/put away  Lower Body Dressing/Undressing Lower body dressing Lower body dressing/undressing activity did not occur: Refused What is the patient wearing?: Underwear, Pants, Socks, Shoes Underwear - Performed by patient: Thread/unthread right underwear leg, Thread/unthread left underwear leg, Pull underwear up/down Underwear - Performed by helper: Thread/unthread right underwear leg, Thread/unthread left underwear leg, Pull underwear up/down Pants- Performed by patient: Thread/unthread right pants leg, Thread/unthread left pants leg, Pull pants up/down Pants- Performed by helper: Pull pants up/down Non-skid slipper socks- Performed by patient: Don/doff right sock, Don/doff left sock Non-skid slipper socks- Performed by helper: Don/doff right sock, Don/doff left sock  Socks - Performed by patient: Don/doff right sock, Don/doff left sock   Shoes - Performed by patient: Don/doff  right shoe, Don/doff left shoe, Fasten right, Fasten left Shoes - Performed by helper: Don/doff right shoe, Don/doff left shoe, Fasten right, Fasten left     TED Hose - Performed by patient: Don/doff right TED hose, Don/doff left TED hose TED Hose - Performed by helper: Don/doff right TED hose, Don/doff left TED hose  Lower body assist Assist for lower body dressing: Supervision or verbal cues      Toileting Toileting Toileting activity did not occur: N/A Toileting steps completed by patient: Adjust clothing prior to toileting, Performs perineal hygiene, Adjust clothing after toileting Toileting steps completed by helper: Adjust clothing after toileting Toileting Assistive Devices: Grab bar or rail  Toileting assist Assist level: Supervision or verbal cues   Transfers Chair/bed transfer   Chair/bed transfer method: Ambulatory, Stand pivot Chair/bed transfer assist level: Supervision or verbal cues Chair/bed transfer assistive device: Armrests, Patent attorney Ambulation activity did not occur: Refused   Max distance: 150 Assist level: Supervision or verbal cues   Wheelchair   Type: Manual Max wheelchair distance: 150 Assist Level: Supervision or verbal cues  Cognition Comprehension Comprehension assist level: Understands basic 75 - 89% of the time/ requires cueing 10 - 24% of the time  Expression Expression assist level: Expresses basic 90% of the time/requires cueing < 10% of the time.  Social Interaction Social Interaction assist level: Interacts appropriately 75 - 89% of the time - Needs redirection for appropriate language or to initiate interaction.  Problem Solving Problem solving assist level: Solves basic 75 - 89% of the time/requires cueing 10 - 24% of the time  Memory Memory assist level: Recognizes or recalls 25 - 49% of the time/requires cueing 50 - 75% of the time   Medical Problem List and Plan: 1. 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  2. LLE DVT: eliquis. No bleeding sequelae 3. Pain Management:  oxycodone prn for headaches and back/leg pain.  4. Mood: LCSW to follow for evaluation and support.  5. Neuropsych: This patient is not capable of making decisions on his own behalf.  -continue to provide appropriate environment for sleep and communication  -environmental mod  -continue sleep chart  -continue ritalin to 15 mg bid to augment attention/processing 6. Skin/Wound Care: routine pressure relief measures.  7. Fluids/Electrolytes/Nutrition: intake inconsistent. Added nutritional supplements. Monitor for now.  .  -continue Megace as outpt---can wean over the course of 3-4 weeks 8. HTN: improved control   -resumed low dose Cozaar with good control    9. DM type 2:   -continue to hold metformin and use SSI for now. sugars remain under control but intake is poor       -sugars under control 10. Seizures disorder: Was on lamictal bid prior to admission. On keppra PO bid currently 11. ABLA: continue iron supplement..  12. Mild ileus:   two Senna S bid. Use enema prn.  13. Hypokalemia: Supplement to avoid recurrence of ileus.  14. Leukocytosis: Resolved    15. Chronic right knee pain/lower ext pain: improving   -voltaren gel, prn ICE 16. PVCs: Negative cardiac work up 05/2015 17. Safety:  Will require ongoing supervision at home due to fall risk     LOS (Days) 27 A FACE  TO FACE EVALUATION WAS PERFORMED  Darryl [name] T 07/24/2015 8:39 AM

## 2015-07-25 DIAGNOSIS — I6911 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: Secondary | ICD-10-CM

## 2015-07-26 ENCOUNTER — Telehealth: Payer: Self-pay | Admitting: Physical Medicine & Rehabilitation

## 2015-07-26 NOTE — Telephone Encounter (Signed)
Spoke with Lambert Keto on phone number [phone]  1. Are you/is patient experiencing any problems since coming home? Are there any questions regarding any aspect of care? No 2. Are there any questions regarding medications administration/dosing? Are meds being taken as prescribed? No problems. He is taking all medications and will bring them to appointment 3. Have there been any falls? No 4. Has Home Health been to the house and/or have they contacted you? If not, have you tried to contact them? Can we help you contact them?Yes. His daughter has contacted everyone and he is expecting home health today 5. Are bowels and bladder emptying properly? Are there any unexpected incontinence issues? If applicable, is patient following bowel/bladder programs? No problems 6. Any fevers, problems with breathing, unexpected pain? No problems 7. Are there any skin problems or new areas of breakdown? No 8. Has the patient/family member arranged specialty MD follow up (ie cardiology/neurology/renal/surgical/etc)?  Can we help arrange? He believes his daughter made all the necessary appointments 9. Does the patient need any other services or support that we can help arrange? Not that he can think of  10. Are caregivers following through as expected in assisting the patient? Yes.  Spoke with patient for several minutes regarding his care and transfer home. He states he is doing well. His daughter made an appointment for March 22 with Dr. Riley Kill and I asked him to speak with her as we need to see him in the office between March 6th and 8th. He stated he understood and with arranging transportation, he would call back today or tomorrow to reschedule. I repeated the phone number and had the patient repeat the number back. He repeated it correctly and stated he was writing it down.

## 2015-08-06 ENCOUNTER — Inpatient Hospital Stay: Payer: BC Managed Care – PPO | Admitting: Physical Medicine & Rehabilitation

## 2015-08-09 ENCOUNTER — Telehealth: Payer: Self-pay | Admitting: *Deleted

## 2015-08-09 NOTE — Telephone Encounter (Signed)
He has functional ADD

## 2015-08-09 NOTE — Telephone Encounter (Signed)
Unfortunately, that information was not readily available in the patients notes, nor was it listed as a diagnosis. I called the patient to inform of denial, and called the pharmacy to see what out of pocket cost would be. I was told by the pharmacy that out of pocket expense would be approx $90.00.  Our only option at this point is to write a letter of appeal stating the addition of functional ADD....Marland Kitchen.please advise

## 2015-08-09 NOTE — Telephone Encounter (Signed)
Received fax from [facility] [employer] denying coverage for methylphenidate 10 mg tab.  Patient has a hospital f/u on 08/27/2015 with Dr. Riley KillSwartz. Reason for denial is the medication can only be approved if the patient has been diagnosed with ADHD, ADD, or narcolepsy proven by a sleep study....FYI

## 2015-08-10 ENCOUNTER — Encounter: Payer: Self-pay | Admitting: *Deleted

## 2015-08-17 NOTE — Telephone Encounter (Signed)
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 It is clearly in the hospital chart as well as my office note as to the reason he's being prescribed the medication. Please proceed with appeal.  "Medical Problem List and Plan: 1. Problems with mobility, swallow function and cognitive deficits [facility] -dc home today -follow up with me in about a month.  2. LLE DVT: eliquis. No bleeding sequelae 3. Pain Management: oxycodone prn for headaches and back/leg pain.  4. Mood: LCSW to follow for evaluation and support.  5. Neuropsych: This patient is not capable of making decisions on his own behalf. -continue to provide appropriate environment for sleep and communication -environmental mod -continue sleep chart -continue ritalin to 15 mg bid to augment attention/processing 6. Skin/Wound Care: routine pressure relief measures.  7. Fluids/Electrolytes/Nutrition: intake inconsistent. Added nutritional supplements. Monitor for now. . -continue Megace as outpt---can wean over the course of 3-4 weeks 8. HTN: improved control  -resumed low dose Cozaar with good control   9. DM type 2:  -continue to hold metformin and use SSI for now. sugars remain under control but intake is poor   -sugars under control"

## 2015-08-17 NOTE — Telephone Encounter (Signed)
Done, faxed and sent by standard mail

## 2015-08-22 ENCOUNTER — Inpatient Hospital Stay: Payer: BC Managed Care – PPO | Admitting: Physical Medicine & Rehabilitation

## 2015-08-27 ENCOUNTER — Encounter
Payer: BC Managed Care – PPO | Attending: Physical Medicine & Rehabilitation | Admitting: Physical Medicine & Rehabilitation

## 2015-08-27 DIAGNOSIS — I10 Essential (primary) hypertension: Secondary | ICD-10-CM | POA: Insufficient documentation

## 2015-08-27 DIAGNOSIS — G40409 Other generalized epilepsy and epileptic syndromes, not intractable, without status epilepticus: Secondary | ICD-10-CM | POA: Insufficient documentation

## 2015-08-27 DIAGNOSIS — G8929 Other chronic pain: Secondary | ICD-10-CM | POA: Insufficient documentation

## 2015-08-27 DIAGNOSIS — I69011 Memory deficit following nontraumatic subarachnoid hemorrhage: Secondary | ICD-10-CM | POA: Insufficient documentation

## 2015-08-27 DIAGNOSIS — E119 Type 2 diabetes mellitus without complications: Secondary | ICD-10-CM | POA: Insufficient documentation

## 2015-08-27 DIAGNOSIS — G914 Hydrocephalus in diseases classified elsewhere: Secondary | ICD-10-CM | POA: Insufficient documentation

## 2015-08-27 DIAGNOSIS — R4189 Other symptoms and signs involving cognitive functions and awareness: Secondary | ICD-10-CM | POA: Insufficient documentation

## 2015-08-27 DIAGNOSIS — Z87891 Personal history of nicotine dependence: Secondary | ICD-10-CM | POA: Insufficient documentation

## 2015-08-27 DIAGNOSIS — M25561 Pain in right knee: Secondary | ICD-10-CM | POA: Insufficient documentation

## 2015-08-27 DIAGNOSIS — Z86718 Personal history of other venous thrombosis and embolism: Secondary | ICD-10-CM | POA: Insufficient documentation

## 2015-08-27 DIAGNOSIS — I609 Nontraumatic subarachnoid hemorrhage, unspecified: Secondary | ICD-10-CM | POA: Insufficient documentation

## 2015-08-27 DIAGNOSIS — I729 Aneurysm of unspecified site: Secondary | ICD-10-CM | POA: Insufficient documentation

## 2015-08-27 DIAGNOSIS — R2689 Other abnormalities of gait and mobility: Secondary | ICD-10-CM | POA: Insufficient documentation

## 2015-08-27 DIAGNOSIS — R410 Disorientation, unspecified: Secondary | ICD-10-CM | POA: Insufficient documentation

## 2015-08-27 DIAGNOSIS — R531 Weakness: Secondary | ICD-10-CM | POA: Insufficient documentation

## 2015-08-28 ENCOUNTER — Encounter: Payer: Self-pay | Admitting: Physical Medicine & Rehabilitation

## 2015-08-28 ENCOUNTER — Encounter (HOSPITAL_BASED_OUTPATIENT_CLINIC_OR_DEPARTMENT_OTHER): Payer: BC Managed Care – PPO | Admitting: Physical Medicine & Rehabilitation

## 2015-08-28 VITALS — BP 120/84 | HR 76

## 2015-08-28 DIAGNOSIS — R531 Weakness: Secondary | ICD-10-CM | POA: Diagnosis not present

## 2015-08-28 DIAGNOSIS — G8929 Other chronic pain: Secondary | ICD-10-CM | POA: Diagnosis present

## 2015-08-28 DIAGNOSIS — E119 Type 2 diabetes mellitus without complications: Secondary | ICD-10-CM | POA: Diagnosis not present

## 2015-08-28 DIAGNOSIS — E118 Type 2 diabetes mellitus with unspecified complications: Secondary | ICD-10-CM | POA: Diagnosis not present

## 2015-08-28 DIAGNOSIS — I6911 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: Secondary | ICD-10-CM | POA: Diagnosis not present

## 2015-08-28 DIAGNOSIS — I69011 Memory deficit following nontraumatic subarachnoid hemorrhage: Secondary | ICD-10-CM | POA: Diagnosis not present

## 2015-08-28 DIAGNOSIS — I10 Essential (primary) hypertension: Secondary | ICD-10-CM | POA: Diagnosis not present

## 2015-08-28 DIAGNOSIS — G914 Hydrocephalus in diseases classified elsewhere: Secondary | ICD-10-CM | POA: Diagnosis not present

## 2015-08-28 DIAGNOSIS — G40409 Other generalized epilepsy and epileptic syndromes, not intractable, without status epilepticus: Secondary | ICD-10-CM | POA: Diagnosis not present

## 2015-08-28 DIAGNOSIS — I82412 Acute embolism and thrombosis of left femoral vein: Secondary | ICD-10-CM

## 2015-08-28 DIAGNOSIS — I729 Aneurysm of unspecified site: Secondary | ICD-10-CM | POA: Diagnosis not present

## 2015-08-28 DIAGNOSIS — M25561 Pain in right knee: Secondary | ICD-10-CM | POA: Diagnosis not present

## 2015-08-28 DIAGNOSIS — R410 Disorientation, unspecified: Secondary | ICD-10-CM | POA: Diagnosis not present

## 2015-08-28 DIAGNOSIS — I609 Nontraumatic subarachnoid hemorrhage, unspecified: Secondary | ICD-10-CM | POA: Diagnosis not present

## 2015-08-28 DIAGNOSIS — Z87891 Personal history of nicotine dependence: Secondary | ICD-10-CM | POA: Diagnosis not present

## 2015-08-28 DIAGNOSIS — R4189 Other symptoms and signs involving cognitive functions and awareness: Secondary | ICD-10-CM | POA: Diagnosis not present

## 2015-08-28 DIAGNOSIS — Z86718 Personal history of other venous thrombosis and embolism: Secondary | ICD-10-CM | POA: Diagnosis not present

## 2015-08-28 DIAGNOSIS — R2689 Other abnormalities of gait and mobility: Secondary | ICD-10-CM | POA: Diagnosis not present

## 2015-08-28 NOTE — Patient Instructions (Signed)
  PLEASE CALL ME WITH ANY PROBLEMS OR QUESTIONS (#[phone]).      

## 2015-08-28 NOTE — Progress Notes (Signed)
Subjective:    Patient ID: Darryl [name], male    DOB: 25-May-1951, 65 y.o.   MRN: [id number]  HPI    Darryl [name] is here in follow up of his SAH. He is still having issues with his balance and memory. His right knee locks up on occasion and his memory is about half of what it was before. He is home with his wife now. He can be left alone within the home although nobody ever leaves the house without him.   His sleep is good. He denies any falls. His bowels and bladder are functioning although he's emptying his bladder more often at night.   He suffered a grand mal seizure 2 weeks ago and his lamictal was increased. He is still on keppra was increased.    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   Pain Inventory Average Pain 3 Pain Right Now 2 My pain is intermittent and burning  In the last 24 hours, has pain interfered with the following? General activity 2 Relation with others 2 Enjoyment of life 5 What TIME of day is your pain at its worst? na Sleep (in general) NA  Pain is worse with: standing Pain improves with: rest Relief from Meds: 5  Mobility walk with assistance use a cane ability to climb steps?  no do you drive?  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  no  Physicians involved in your care      Family History  Problem Relation Age of Onset  . Alzheimer's disease Mother   . Diabetes Father   . Aneurysm Brother   . Aneurysm Sister    Social History   Social History  . Marital Status: Married    Spouse Name: N/A  . Number of Children: N/A  . Years of Education: N/A   Social History Main Topics  . Smoking status: Former Smoker    Quit date: 06/02/1988  . Smokeless tobacco: None  . Alcohol Use: No  . Drug Use: No  .  Sexual Activity: Not Asked   Other Topics Concern  . None   Social History Narrative   Past Surgical History  Procedure Laterality Date  . Knee surgery    . Cholecystectomy    . Radiology with anesthesia N/A 06/11/2015    Procedure: RADIOLOGY WITH ANESTHESIA;  Surgeon: Lisbeth Renshaw, MD;  Location: [facility] OR;  Service: Radiology;  Laterality: N/A;   Past Medical History  Diagnosis Date  . Hypertension   . Seizures (HCC)   . Diabetes mellitus without complication (HCC)   . Acute deep vein thrombosis (DVT) of left femoral vein (HCC) 07/01/2015   BP 120/84 mmHg  Pulse 76  SpO2 99%  Opioid Risk Score:   Fall Risk Score:  `1  Depression screen PHQ 2/9  Depression screen PHQ 2/9 08/28/2015  Decreased Interest 0  Down, Depressed, Hopeless 0  PHQ - 2 Score 0  Altered sleeping 0  Tired, decreased energy 1  Change in appetite 3  Feeling bad or failure about yourself  0  Trouble concentrating 3  Moving slowly or fidgety/restless 0  Suicidal thoughts 0  PHQ-9 Score 7  Difficult doing work/chores Extremely dIfficult     Review of Systems  All other systems reviewed and  are negative.      Objective:   Physical Exam  Constitutional: He appears well-developed and well-nourished. NAD. Vital signs reviewed. HENT: Normocephalic. Scalp clean and dry Eyes: Conjunctivae and EOM are normal.  Cardiovascular: Normal rate and regular rhythm.  Respiratory: Effort normal and breath sounds normal. No stridor. No respiratory distress. He has no wheezes.  GI: Soft. Bowel sounds are normal. He exhibits no distension. There is no tenderness.  Musculoskeletal: + Tenderness right lateral thorax with morvement and palpation. No bruising. No erythema, edema, warm Neurological: He is alert.  A&Ox3. Good insight and awareness. Initiation and attention appear fairly functional. Able to spell "world" forwards and backwards. Able to sequence numbers. Aware of current events. Abstract thinking  generally appropriate. Remembered 0/3 words after 5 minutes. .   .  Motor: 5/5 grossly throughout with some pain inhibition at the right knee. Gait is intact with cane although he does favor the right leg slightly. Weight shift is fair to good. He displayed no risk of falling. Movements were controlled. He is not impulsive.  Skin: Skin is warm and dry.  Psychiatric: Cognition and memory are impaired. He expresses inappropriate judgment. He remains inattentive.       Assessment & Plan:  1. Problems with mobility, swallow function and cognitive deficits [facility] to [facility] -will advance to outpt therapies, PT/SLP.  2. LLE DVT: xarelto  For 1-2 more months with follow up doppler study 3. Pain Management: voltaren gel for knees.    5. Neuropsych:  -consider memory agent but will work with therapy for now to develop improved cognitive and memory strategies   7. Fluids/Electrolytes/Nutrition: megace prn  -may need to use more regularly to establish more regular intake 8. HTN: improved control    9. DM type 2:  -glucophage scheduled 10. Seizures disorder: lamictal . On keppra PO bid currently   15. Chronic right knee pain/lower ext pain: improving  -voltaren gel, prn ICE  -follow up with Dr. Ophelia CharterYates  -arthroscopic surgery at some point   Thirty minutes of face to face patient care time were spent during this visit. All questions were encouraged and answered. Follow up in 6 weeks.

## 2015-09-06 ENCOUNTER — Telehealth: Payer: Self-pay | Admitting: Physical Therapy

## 2015-09-06 ENCOUNTER — Ambulatory Visit: Payer: BC Managed Care – PPO | Attending: Family Medicine | Admitting: Physical Therapy

## 2015-09-06 ENCOUNTER — Ambulatory Visit: Payer: BC Managed Care – PPO | Admitting: Speech Pathology

## 2015-09-06 ENCOUNTER — Encounter: Payer: Self-pay | Admitting: Physical Therapy

## 2015-09-06 ENCOUNTER — Encounter: Payer: BC Managed Care – PPO | Admitting: Speech Pathology

## 2015-09-06 ENCOUNTER — Ambulatory Visit: Payer: BC Managed Care – PPO | Admitting: Physical Therapy

## 2015-09-06 DIAGNOSIS — R41842 Visuospatial deficit: Secondary | ICD-10-CM | POA: Insufficient documentation

## 2015-09-06 DIAGNOSIS — R42 Dizziness and giddiness: Secondary | ICD-10-CM | POA: Insufficient documentation

## 2015-09-06 DIAGNOSIS — I69118 Other symptoms and signs involving cognitive functions following nontraumatic intracerebral hemorrhage: Secondary | ICD-10-CM | POA: Insufficient documentation

## 2015-09-06 DIAGNOSIS — M6281 Muscle weakness (generalized): Secondary | ICD-10-CM | POA: Insufficient documentation

## 2015-09-06 DIAGNOSIS — I609 Nontraumatic subarachnoid hemorrhage, unspecified: Secondary | ICD-10-CM

## 2015-09-06 DIAGNOSIS — R278 Other lack of coordination: Secondary | ICD-10-CM | POA: Diagnosis present

## 2015-09-06 DIAGNOSIS — R2689 Other abnormalities of gait and mobility: Secondary | ICD-10-CM | POA: Insufficient documentation

## 2015-09-06 DIAGNOSIS — Z9181 History of falling: Secondary | ICD-10-CM | POA: Insufficient documentation

## 2015-09-06 DIAGNOSIS — R2681 Unsteadiness on feet: Secondary | ICD-10-CM

## 2015-09-06 DIAGNOSIS — R41841 Cognitive communication deficit: Secondary | ICD-10-CM | POA: Insufficient documentation

## 2015-09-06 NOTE — Patient Instructions (Signed)
Gaze Stabilization: Sitting    Sit in a chair with your target ("A") 5 feet away at eye-level. While keeping eyes on target ("A") tilt head down slightly head side to side for 30 seconds. Repeat while moving head up and down 30 times. Do __2__ sessions per day. Be sure the "A" remains clear the entire time. If the "A" is blurry or is moving, decrease the speed of your head movement.

## 2015-09-06 NOTE — Therapy (Signed)
[facility] [address] [place], [place], [address] Phone: [phone]   Fax:  [phone]  Physical Therapy Evaluation  Patient Details  Name: Darryl [name] MRN: [id number] Date of Birth: 06-19-1959 Referring Provider: Faith Rogue, MD  Encounter Date: 09/05/2064      PT End of Session - 09/06/15 1959    Visit Number 1   Number of Visits 17  eval + 16 visits   Date for PT Re-Evaluation 11/05/15   Authorization Type BCBS; no visit limit, no auth required   PT Start Time 1105   PT Stop Time 1207   PT Time Calculation (min) 62 min   Equipment Utilized During Treatment Gait belt   Activity Tolerance Patient tolerated treatment well   Behavior During Therapy WFL for tasks assessed/performed      Past Medical History  Diagnosis Date  . Hypertension   . Seizures (HCC)   . Diabetes mellitus without complication (HCC)   . Acute deep vein thrombosis (DVT) of left femoral vein (HCC) 07/01/2015    Past Surgical History  Procedure Laterality Date  . Knee surgery    . Cholecystectomy    . Radiology with anesthesia N/A 06/11/2015    Procedure: RADIOLOGY WITH ANESTHESIA;  Surgeon: Lisbeth Renshaw, MD;  Location: [facility] OR;  Service: Radiology;  Laterality: N/A;    There were no vitals filed for this visit.       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 Inpatient rehab from 1/8- 07/24/15 (including inpatient rehab from 1/25 - 07/24/15)  Prior to hospitalization, pt independent with all mobility. Currently, uses [facility] for all mobility. Sustained fall on Tuesday when trying to pick something up off the floor. No head trauma, no LOC, but does have R hip soreness.    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    Patient Stated Goals "I want to get stronger in my right leg; to get rid of the cane; and to work on balance."   Currently in Pain? Yes   Pain Score 1    Pain Location Knee   Pain Orientation Right   Pain Descriptors / Indicators Aching   Pain Type Chronic pain   Pain Onset More than a month ago   Pain Frequency Intermittent   Aggravating Factors  Unsure what causes it, but seems to be worse after "walking too much," per pt.   Pain Relieving Factors Sitting, resting   Multiple Pain Sites Yes   Pain Location Hip   Pain Orientation Right;Lateral   Pain Descriptors / Indicators Discomfort   Pain Type Acute pain   Pain Onset In the past 7 days  since sustaining fall on Tuesday   Pain Frequency Intermittent   Aggravating Factors  after sitting too long; when moving around too much   Pain Relieving Factors pt unaware of any pain relieving factors            [facility] PT Assessment - 09/06/15 0001    Assessment   Medical Diagnosis SAH   Referring Provider Faith Rogue, MD   Onset Date/Surgical Date 06/10/15   Hand Dominance Right   Precautions   Precautions Fall   Restrictions   Weight Bearing Restrictions No   Balance Screen  Has the patient fallen in the past 6 months Yes   How many times? 6   Has the patient had a decrease in activity level because of a fear of falling?  Yes   Is the patient reluctant to leave their home because of a fear of falling?  Yes   Home Environment   Living Environment Private residence   Living Arrangements Spouse/significant other   Type of Home House   Home Access Stairs to enter   Entrance Stairs-Number of Steps 6   Entrance Stairs-Rails Cannot reach both   Home Layout Two level   Alternate Level Stairs-Number of Steps 14   Alternate Level Stairs-Rails --  2 rails most of the way; 3 stairs w/o rails   Home Equipment Walker - 2 wheels;Cane - single point;Wheelchair -  manual;Shower seat  hasn't used shower seat in a while   Additional Comments Bedroom is upstairs   Prior Function   Level of Independence Independent   Vocation Full time employment   Scientist, clinical (histocompatibility and immunogenetics) at [employer]   Leisure likes [employer] basketball, watching TV, and to go to [employer]   Overall Cognitive Status Impaired/Different from baseline  See SLP evaluation   Sensation   Light Touch Appears Intact  BLE's   Proprioception Appears Intact  BLE's   Coordination   Gross Motor Movements are Fluid and Coordinated No   Coordination and Movement Description Poor grading of weight shifting, LE advancement during Berg and ambulation.   Heel Shin Test Grading, quality of movement impaired bilaterally,   ROM / Strength   AROM / PROM / Strength AROM   AROM   Overall AROM  Deficits   Overall AROM Comments R knee pain with active R hip knee flexion with R knee extension.   PROM   Overall PROM  Deficits   Overall PROM Comments No pain with RLE PROM.   Strength   Overall Strength Deficits   Overall Strength Comments L hip flexion 4/5, extension 3-/5, L hip ABD 3-/5; L knee/ankle strength grossly WFL. R hip flexion NT due to pain with AROM; R hip ABD 3-/5, hip extension 3-/5,  R knee extension 4-/5, knee flex 4/5, ankle DF/PF 4/5.            Vestibular Assessment - 09/06/15 0001    Vestibular Assessment   General Observation h/o orthostatic hypotension in hospital; vision "not in focus" with head movement   Symptom Behavior   Type of Dizziness Blurred vision   Frequency of Dizziness daily   Duration of Dizziness seconds   Aggravating Factors Activity in general;Turning body quickly;Turning head quickly;Supine to sit;Forward bending   Relieving Factors Head stationary;Rest   Occulomotor Exam   Occulomotor Alignment Abnormal  asymmetrical eye position (L inferior)   Spontaneous Absent   Smooth Pursuits Saccades   Comment Impaired convergence   Vestibulo-Occular  Reflex   VOR 1 Head Only (x 1 viewing) Reports blurring of visual target unless head movement is very slow; consistently loses visual target.               [facility] Adult PT Treatment/Exercise - 09/06/15 0001    Ambulation/Gait   Ambulation/Gait Yes   Ambulation/Gait Assistance 4: Min guard;4: Min assist   Ambulation/Gait Assistance Details Gait 2 x100' with SPC with close supervision to min guard, x40' without AD with min A. Min guard-min A with turning (body and head). Pt veers laterally during linear gait without AD.   Ambulation  Distance (Feet) 240 Feet   Assistive device Straight cane;None   Gait Pattern Step-through pattern;Right flexed knee in stance;Scissoring;Wide base of support;Decreased arm swing - left;Decreased arm swing - right;Decreased stride length  without AD, BUE's in high guard position   Ambulation Surface Level;Indoor   Gait velocity 1.66 ft/sec   Standardized Balance Assessment   Standardized Balance Assessment Berg Balance Test   Berg Balance Test   Sit to Stand Able to stand  independently using hands   Standing Unsupported Able to stand safely 2 minutes   Sitting with Back Unsupported but Feet Supported on Floor or Stool Able to sit safely and securely 2 minutes   Stand to Sit Sits independently, has uncontrolled descent   Transfers Able to transfer safely, definite need of hands   Standing Unsupported with Eyes Closed Able to stand 10 seconds with supervision  AP sway   Standing Ubsupported with Feet Together Able to place feet together independently and stand for 1 minute with supervision  multi-directional postural sway   From Standing, Reach Forward with Outstretched Arm Can reach forward >12 cm safely (5")  8"   From Standing Position, Pick up Object from Floor Unable to try/needs assist to keep balance  Min A to prevent anterior LOB   From Standing Position, Turn to Look Behind Over each Shoulder Looks behind one side only/other side shows less  weight shift  weight shift to L > R   Turn 360 Degrees Needs close supervision or verbal cueing  very slowly; pt reports dizziness   Standing Unsupported, Alternately Place Feet on Step/Stool Needs assistance to keep from falling or unable to try  mod A to recovery from posterior LOB in R SLS   Standing Unsupported, One Foot in Front Able to take small step independently and hold 30 seconds   Standing on One Leg Tries to lift leg/unable to hold 3 seconds but remains standing independently   Total Score 31                PT Education - 09/06/15 1954    Education provided Yes   Education Details PT eval findings, goals, and POC. Berg findings and implicated fall risk. HEP initiated for gaze stabilization; see Pt Instructions. Compensatory strategy to dec dizziness with turning.   Person(s) Educated Patient;Spouse   Methods Explanation;Demonstration;Verbal cues;Handout   Comprehension Verbalized understanding;Returned demonstration          PT Short Term Goals - 09/06/15 2046    PT SHORT TERM GOAL #1   Title Pt will perform initial home exercise program with mod I using paper handout to indicate safe HEP compliance. (Target date: 10/04/15)   PT SHORT TERM GOAL #2   Title Pt will increase self-selected gait speed from 1.66 ft/sec > 1.8 ft/sec to indicate decreased risk of recurrent falls.    PT SHORT TERM GOAL #3   Title Pt will increase Berg from 31/56 to 35/56 to indicate improved standing balance.    PT SHORT TERM GOAL #4   Title Pt will ambulate 200' over level, indoor surfaces with mod I using LRAD to indicate increased safety with household mobility.     PT SHORT TERM GOAL #5   Title Pt will negotiate 14 stairs with 2 rails with mod I to enable progress toward safety/independence accessing second floor of home.    Additional Short Term Goals   Additional Short Term Goals Yes   PT SHORT TERM GOAL #6   Title Pt will negotiate  3 stairs without rails with mod I using LRAD to  enable pt to safely access second floor of home.    PT SHORT TERM GOAL #7   Title Pt will negotiate 6 stairs with 1 rail with mod I to enable pt to safely use primary home entrance.    Baseline Of 14 stairs to access second floor, 3 stairs have no rails.    PT SHORT TERM GOAL #8   Title Pt will perform floor transfer with min A using standard chair or mat table to progress toward safety with fall recovery.           PT Long Term Goals - 09/06/15 2057    PT LONG TERM GOAL #1   Title Pt will increase self-selected gait speed from 1.66 ft/sec to > / = 2.26 ft/sec to demonstrate increased efficiency of ambulation.   (Target date: 11/01/15)   PT LONG TERM GOAL #2   Title Pt will improve Berg score from 31/56 to 39/56 to indicate significant improvement in functional standing balance.    PT LONG TERM GOAL #3   Title Pt will ambulate 1,000' over unlevel, paved surfaces with mod I using LRAD with no rest breaks to indicate improved endurance and increased safety with community mobility.   PT LONG TERM GOAL #4   Title Pt will negotiate standard ramp and curb step with mod I using LRAD to indicate increased safety traversing community obstacles.    PT LONG TERM GOAL #5   Title Pt will perform floor transfer with mod I using standard chair or mat table to indicate increased safety with fall recovery.   Additional Long Term Goals   Additional Long Term Goals Yes   PT LONG TERM GOAL #6   Title Pt will perform all above aspects of functional mobility with no more than 2-point increase in pain rating to indicate decreased impact of pain on functional activity tolerance.               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 PMH significant for: HTN, seizures, DM II, chronic R knee pain due to OA, PVC, DVT.  PT  evaluation reveals the following: gait instability, BLE weakness, impaired standing balance, impaired postural awareness/control, impaired grading of movement, decreased activity tolerance, dizziness during functional mobility; impaired VOR, convergence, and smooth pursuits; gait speed and Berg score both indicative of increased fall risk, decreased stability and independence with functional mobility.,Pt will benefit from skilled outpatient PT 2x/week for 8 weeks to address said impairments.   Rehab Potential Good   Clinical Impairments Affecting Rehab Potential positive: age, supportive wife; negative: decreased awareness, chronic R knee pain   PT Frequency 2x / week   PT Duration 8 weeks   PT Treatment/Interventions ADLs/Self Care Home Management;Vestibular;DME Instruction;Gait training;Stair training;Functional mobility training;Therapeutic activities;Therapeutic exercise;Balance training;Orthotic Fit/Training;Patient/family education;Neuromuscular re-education;Visual/perceptual remediation/compensation   PT Next Visit Plan Check home exercise (seated gaze); add head movements (in corner) for habituation, keeping dizziness < 5/10. Standing balance, gait training.   Recommended Other Services 4/6: Requested outpatient OT order; see chart for telephone encounter. Need to schedule OT eval, if order received.   Consulted and Agree with Plan of Care Patient;Family member/caregiver   Family Member Consulted wife, Tora Duck      Patient will benefit from skilled therapeutic intervention in order to  improve the following deficits and impairments:  Postural dysfunction, Abnormal gait, Decreased activity tolerance, Decreased balance, Decreased endurance, Decreased coordination, Dizziness, Decreased knowledge of use of DME, Other (comment), Pain, Decreased strength, Decreased safety awareness, Impaired perceived functional ability, Impaired vision/preception (Pain will be monitored but not directly addressed due  to nature of referral and chronicity of pain.)  Visit Diagnosis: Other abnormalities of gait and mobility  Unsteadiness on feet  Dizziness and giddiness  History of falling  Other lack of coordination     Problem List Patient Active Problem List   Diagnosis Date Noted  . 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 Falls   . Acute deep vein thrombosis (DVT) of left femoral vein (HCC) 07/01/2015  . Pain   . Poor fluid intake   . Essential hypertension   . Type 2 diabetes mellitus with complication, without long-term current use of insulin (HCC)   . Disorientation   . Convulsions (HCC)   . Acute blood loss anemia   . Hypokalemia   . Ileus (HCC)   . Leukocytosis   . Right knee pain   . Cognitive safety issue   . Impulsiveness   . PVC (premature ventricular contraction)   . Abdominal discomfort   . SAH (subarachnoid hemorrhage) (HCC) 06/11/2015  . Emesis   . Subarachnoid hemorrhage (HCC) 06/10/2015  . Hypertension   . Diabetes mellitus without complication (HCC)    Jorje GuildBlair Deklynn Charlet, PT, DPT [facility] [address] [place], [place], [address] Phone: [phone]   Fax:  [phone] 09/06/2015, 9:06 PM   Name: Claria Dicelonzo Rhymes MRN: [id number] Date of Birth: [birth year]

## 2015-09-06 NOTE — Patient Instructions (Signed)
  Cognitive Activities you can do at home:   - Solitaire  - Majong  - Scrabble  - Chess/Checkers  - Crosswords (easy level)  - Juanna CaoUno  - Card Games  - Board Games  - Connect 4  - Simon  - the [employer]co WholesaleMemory Game  On your computer, tablet or phone:  MetallurgistMemory Match Game App Sort it out [employer] IncScrabble pics App [employer] App   Memory Strategies  W - Write it down  A - Associate it with something  R - Repeat it  M - Mental Image     Play the memory game  Try to remember 3-5 items on your store list without looking  Study a detailed picture in a magazine for 1 minute, then write down everything you can remember from the picture

## 2015-09-06 NOTE — Telephone Encounter (Signed)
Dr. Riley KillSwartz,  PT evaluation completed today. Patient and wife reporting pt difficulty with fine motor tasks. Patient may benefit from outpatient OT evaluation.  If you agree, please submit an order for outpatient OT.   Thanks so much,   Jorje GuildBlair Hobble, PT, DPT [facility] [address] [place], [place], [address] Phone: [phone]   Fax:  [phone] 09/06/2015, 8:36 PM

## 2015-09-06 NOTE — Therapy (Signed)
[facility] [address] [place], [place], [address] Phone: [phone]   Fax:  [phone]  Speech Language Pathology Evaluation  Patient Details  Name: Darryl [name] MRN: [id number] Date of Birth: 11-Mar-1951 Referring Provider: Dr. Faith Rogue  Encounter Date: 09/06/2015      End of Session - 09/06/15 1211    Visit Number 1   Number of Visits 17   Date for SLP Re-Evaluation 11/01/15   Authorization Type BCBS   SLP Start Time 1018   SLP Stop Time  1100   SLP Time Calculation (min) 42 min      Past Medical History  Diagnosis Date  . Hypertension   . Seizures (HCC)   . Diabetes mellitus without complication (HCC)   . Acute deep vein thrombosis (DVT) of left femoral vein (HCC) 07/01/2015    Past Surgical History  Procedure Laterality Date  . Knee surgery    . Cholecystectomy    . Radiology with anesthesia N/A 06/11/2015    Procedure: RADIOLOGY WITH ANESTHESIA;  Surgeon: Lisbeth Renshaw, MD;  Location: [facility] OR;  Service: Radiology;  Laterality: N/A;    There were no vitals filed for this visit.  Visit Diagnosis: Cognitive communication deficit - Plan: SLP plan of care cert/re-cert      Subjective Assessment - 09/06/15 1026    Subjective "I am having trouble rememberirng"            SLP Evaluation [facility] - 09/06/15 1027    SLP Visit Information   SLP Received On 09/06/15   Referring Provider Dr. Faith Rogue   Onset Date June 10 2015   Medical Diagnosis ICH   Subjective   Patient/Family Stated Goal "Improve my memory"   General Information   HPI Darryl [name] is a 65 y.o. male with history fo HTN, DM type 2, PVCs, seizures who was seen in ED 06/10/15 with severe HA but CT head without acute changes. Late that day he had worsening of HA followed by N/V later with unresponsiveness. Patient intubated and sedated in ED. CT head diffuse SAH aneurysmal in nature with developing hydrocephalus and  cerebral edema. He was evaluated by Dr. Wynetta Emery and ventriculostomy placed for management of hydrocephalus. 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 Patient underwent cerebral angio with coiling of fusiform right V4 aneurysm.Pt received ST on CIR and HH ST.   Prior Functional Status   Cognitive/Linguistic Baseline Within functional limits   Type of Home House    Lives With Spouse   Available Support Friend(s)   Vocation Full time employment   Cognition   Overall Cognitive Status Impaired/Different from baseline   Area of Impairment Memory;Attention   Current Attention Level Alternating;Divided   Memory Decreased short-term memory   Reading Comprehension   Reading Status Within funtional limits   Verbal Expression   Overall Verbal Expression Appears within functional limits for tasks assessed   Written Expression   Dominant Hand Right   Written Expression Within Functional Limits   Overall Writen Expression Pt c/o of difficult with hand writing - consider OT eval   Standardized Assessments   Standardized Assessments  Montreal Cognitive Assessment [facility])                         SLP Education - 09/06/15 1211    Education provided Yes   Education Details compensatory strategies for [employer]) Educated Patient;Spouse   Methods  Explanation;Demonstration;Handout   Comprehension Verbalized understanding;Need further instruction          SLP Short Term Goals - 09/06/15 1219    SLP SHORT TERM GOAL #1   Title Pt will utlize external aids for recall of scedule, daily tasks and events over 3 sessions.    Time 4   Period Weeks   Status New   SLP SHORT TERM GOAL #2   Title Pt will attend to details on moderately complex cognitive linguistic tasks with rare min A   Time 4   Period Weeks   Status New   SLP SHORT TERM GOAL #3   Title Pt will sustain attention to cognitive linguistic tasks in distracting environment with rare min  A   Time 4   Period Weeks   Status New          SLP Long Term Goals - 09/06/15 1226    SLP LONG TERM GOAL #1   Title Pt will divide attention between 2 moderately complex cognitive linguistic tasks with 85% on each and rare min A.   Time 8   Period Weeks   Status New   SLP LONG TERM GOAL #2   Title Pt will utilize compenstory strategies to recall new information (paragraphs, messages, etc) 85% accuracy and rare min A   Time 8   Period Weeks   Status New          Plan - 09/06/15 1212    Clinical Impression Statement Darryl [name], a 65 y.o. male s/p SAH/aneurysm in January 2017 presents today with cognitive communication impairent characterized by reduced short term memory and attention to detail/divided attention.Darryl [name] scored a 28/30 on the [facility] Cognitive Assessment (MoCA) which is [facility]FL. He required extended time for figure copying and recalled 3/5 words with a delay. Darryl [name] demonstrated reduced attention to detail on check writing/balancing task. He did not follow written directions, instead writing the check to his wife for a  random amount. He also did not attend to direction to enter the check in the register. With conversation as a distractor, pt was not aware of errors and required extended time for check writing task. Pt and his spouse report some pre-morbid, life long difficulty with divded attention/multi tasking and returning to tasks . Pt reports forgetting appointments, messages, and short term information frequently. 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    Speech Therapy Frequency 2x / week   Duration --  8 weeks   Treatment/Interventions Cognitive reorganization;SLP instruction and feedback;Internal/external aids;Compensatory strategies;Patient/family education;Functional tasks;Cueing hierarchy   Potential to Achieve Goals Good   Potential Considerations Ability to  learn/carryover information   Consulted and Agree with Plan of Care Patient;Family member/caregiver   Family Member Consulted spouse        Problem List Patient Active Problem List   Diagnosis Date Noted  . 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 Falls   . Acute deep vein thrombosis (DVT) of left femoral vein (HCC) 07/01/2015  . Pain   . Poor fluid intake   . Essential hypertension   . Type 2 diabetes mellitus with complication, without long-term current use of insulin (HCC)   . Disorientation   . Convulsions (HCC)   . Acute blood loss anemia   . Hypokalemia   . Ileus (HCC)   . Leukocytosis   . Right knee pain   . Cognitive  safety issue   . Impulsiveness   . PVC (premature ventricular contraction)   . Abdominal discomfort   . SAH (subarachnoid hemorrhage) (HCC) 06/11/2015  . Emesis   . Subarachnoid hemorrhage (HCC) 06/10/2015  . Hypertension   . Diabetes mellitus without complication (HCC)     Kirk Sampley, Radene Journey  MS, CCC-SLP  09/06/2015, 12:29 PM  [facility] [address] [place], [place], [address] Phone: [phone]   Fax:  [phone]  Name: Maxemiliano Riel MRN: [id number] Date of Birth: Oct 28, 1950

## 2015-09-07 NOTE — Telephone Encounter (Signed)
It's done.  Thanks  Another FYI, once you route the message to the doctor (or whomever), just exit out by clicking the "x" next to the patient's name---don't sign/close

## 2015-09-07 NOTE — Addendum Note (Signed)
Addended by: Faith RogueSWARTZ, Ellamae Lybeck T on: 09/07/2015 02:29 PM   Modules accepted: Orders

## 2015-09-09 ENCOUNTER — Other Ambulatory Visit: Payer: Self-pay | Admitting: Physical Medicine and Rehabilitation

## 2015-09-12 ENCOUNTER — Encounter: Payer: BC Managed Care – PPO | Admitting: Cardiology

## 2015-09-12 NOTE — Progress Notes (Signed)
This encounter was created in error - please disregard.

## 2015-09-19 ENCOUNTER — Ambulatory Visit: Payer: BC Managed Care – PPO | Admitting: Physical Therapy

## 2015-09-19 ENCOUNTER — Encounter: Payer: Self-pay | Admitting: Physical Therapy

## 2015-09-19 DIAGNOSIS — R2689 Other abnormalities of gait and mobility: Secondary | ICD-10-CM | POA: Diagnosis not present

## 2015-09-19 DIAGNOSIS — R42 Dizziness and giddiness: Secondary | ICD-10-CM

## 2015-09-19 DIAGNOSIS — R2681 Unsteadiness on feet: Secondary | ICD-10-CM

## 2015-09-19 DIAGNOSIS — Z9181 History of falling: Secondary | ICD-10-CM

## 2015-09-19 DIAGNOSIS — I609 Nontraumatic subarachnoid hemorrhage, unspecified: Secondary | ICD-10-CM

## 2015-09-19 DIAGNOSIS — R41841 Cognitive communication deficit: Secondary | ICD-10-CM

## 2015-09-19 NOTE — Therapy (Signed)
[facility] [address] [place], [place], [address] Phone: [phone]   Fax:  [phone]  Physical Therapy Treatment  Patient Details  Name: Darryl [name] MRN: [id number] Date of Birth: 09-Sep-1950 Referring Provider: Faith Rogue, MD  Encounter Date: 09/19/2015      PT End of Session - 09/19/15 1615    Visit Number 2   Number of Visits 17  eval + 16 visits   Date for PT Re-Evaluation 11/05/15   Authorization Type BCBS; no visit limit, no auth required   PT Start Time 1400   PT Stop Time 1445   PT Time Calculation (min) 45 min   Equipment Utilized During Treatment Gait belt   Activity Tolerance Patient tolerated treatment well   Behavior During Therapy [facility] for tasks assessed/performed      Past Medical History  Diagnosis Date  . Hypertension   . Seizures (HCC)   . Diabetes mellitus without complication (HCC)   . Acute deep vein thrombosis (DVT) of left femoral vein (HCC) 07/01/2015    Past Surgical History  Procedure Laterality Date  . Knee surgery    . Cholecystectomy    . Radiology with anesthesia N/A 06/11/2015    Procedure: RADIOLOGY WITH ANESTHESIA;  Surgeon: Lisbeth Renshaw, MD;  Location: [facility] OR;  Service: Radiology;  Laterality: N/A;    There were no vitals filed for this visit.      Subjective Assessment - 09/19/15 1407    Subjective Last Wednesday fell out of office chair when picking object off floor.  Feels like he bruised his back.  Reports h/o left hip pain, "lately it is more often," LK:GMWN.   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    Patient Stated Goals "I want to get stronger in my right leg; to get rid of the cane; and to work on balance."   Currently in Pain? Yes   Pain Score 6    Pain  Location Knee   Pain Orientation Right   Pain Descriptors / Indicators Aching   Pain Type Chronic pain   Pain Onset More than a month ago   Aggravating Factors  "wants to lock when I'm walking or standing."   Multiple Pain Sites Yes   Pain Score 8   Pain Location Hip   Pain Orientation Left   Pain Descriptors / Indicators Aching   Pain Onset In the past 7 days  since sustaining fall on Tuesday   Pain Frequency Intermittent   Aggravating Factors  "Creating problems when I walk."   Pain Score 2   Pain Location Back   Pain Orientation Lower   Pain Descriptors / Indicators Sore   Pain Type Acute pain   Pain Radiating Towards Down spine   Pain Onset In the past 7 days   Pain Frequency Intermittent   Aggravating Factors  picking up something heavy and certain movements   Pain Relieving Factors resting             NMR: Practiced gaze stabilization as written below then progressed into standing position. No LOB or dizziness. Standing in the corner balance exercises on compliant surface; gave as HEP. Performed as in handout below. Supervision level with intermittent support  PT Education - 09/19/15 1421    Education provided Yes   Education Details Reviewed HEP for gaze stabilization   Person(s) Educated Patient   Methods Explanation   Comprehension Verbalized understanding;Need further instruction          PT Short Term Goals - 09/06/15 2046    PT SHORT TERM GOAL #1   Title Pt will perform initial home exercise program with mod I using paper handout to indicate safe HEP compliance. (Target date: 10/04/15)   PT SHORT TERM GOAL #2   Title Pt will increase self-selected gait speed from 1.66 ft/sec > 1.8 ft/sec to indicate decreased risk of recurrent falls.    PT SHORT TERM GOAL #3   Title Pt will increase Berg from 31/56 to 35/56 to indicate improved standing balance.    PT SHORT TERM GOAL #4   Title Pt will ambulate 200' over level,  indoor surfaces with mod I using LRAD to indicate increased safety with household mobility.     PT SHORT TERM GOAL #5   Title Pt will negotiate 14 stairs with 2 rails with mod I to enable progress toward safety/independence accessing second floor of home.    Additional Short Term Goals   Additional Short Term Goals Yes   PT SHORT TERM GOAL #6   Title Pt will negotiate 3 stairs without rails with mod I using LRAD to enable pt to safely access second floor of home.    PT SHORT TERM GOAL #7   Title Pt will negotiate 6 stairs with 1 rail with mod I to enable pt to safely use primary home entrance.    Baseline Of 14 stairs to access second floor, 3 stairs have no rails.    PT SHORT TERM GOAL #8   Title Pt will perform floor transfer with min A using standard chair or mat table to progress toward safety with fall recovery.           PT Long Term Goals - 09/06/15 2057    PT LONG TERM GOAL #1   Title Pt will increase self-selected gait speed from 1.66 ft/sec to > / = 2.26 ft/sec to demonstrate increased efficiency of ambulation.   (Target date: 11/01/15)   PT LONG TERM GOAL #2   Title Pt will improve Berg score from 31/56 to 39/56 to indicate significant improvement in functional standing balance.    PT LONG TERM GOAL #3   Title Pt will ambulate 1,000' over unlevel, paved surfaces with mod I using LRAD with no rest breaks to indicate improved endurance and increased safety with community mobility.   PT LONG TERM GOAL #4   Title Pt will negotiate standard ramp and curb step with mod I using LRAD to indicate increased safety traversing community obstacles.    PT LONG TERM GOAL #5   Title Pt will perform floor transfer with mod I using standard chair or mat table to indicate increased safety with fall recovery.   Additional Long Term Goals   Additional Long Term Goals Yes   PT LONG TERM GOAL #6   Title Pt will perform all above aspects of functional mobility with no more than 2-point increase in  pain rating to indicate decreased impact of pain on functional activity tolerance.               Plan - 09/19/15 1616    Clinical Impression Statement Pt fell again since last visit, but pt reports no dizziness, just being off balance.  Reviewed HEP; pt progressing with compliance with no dizziness and added to HEP standing balance on compliant surface. Pt had decreased difficulty in more narrow BOS and with eyes closed requiring greater  support.   Pt's moblity also seems to be limited by L hip  pain and Right knee "locking" .                                                                                                              Rehab Potential Good   Clinical Impairments Affecting Rehab Potential positive: age, supportive wife; negative: decreased awareness, chronic R knee pain   PT Frequency 2x / week   PT Duration 8 weeks   PT Treatment/Interventions ADLs/Self Care Home Management;Vestibular;DME Instruction;Gait training;Stair training;Functional mobility training;Therapeutic activities;Therapeutic exercise;Balance training;Orthotic Fit/Training;Patient/family education;Neuromuscular re-education;Visual/perceptual remediation/compensation   PT Next Visit Plan Check home exercise for habituation, keeping dizziness < 5/10. Standing balance, gait training. Hip strengthening   Consulted and Agree with Plan of Care Patient;Family member/caregiver   Family Member Consulted wife, [name]      Patient will benefit from skilled therapeutic intervention in order to improve the following deficits and impairments:  Postural dysfunction, Abnormal gait, Decreased activity tolerance, Decreased balance, Decreased endurance, Decreased coordination, Dizziness, Decreased knowledge of use of DME, Other (comment), Pain, Decreased strength, Decreased safety awareness, Impaired perceived functional ability, Impaired vision/preception (Pain will be monitored but not directly addressed due to nature of  referral and chronicity of pain.)  Visit Diagnosis: SAH (subarachnoid hemorrhage) (HCC)  Cognitive communication deficit  Other abnormalities of gait and mobility  Unsteadiness on feet  Dizziness and giddiness  History of falling     Problem List Patient Active Problem List   Diagnosis Date Noted  . 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 Falls   . Acute deep vein thrombosis (DVT) of left femoral vein (HCC) 07/01/2015  . Pain   . Poor fluid intake   . Essential hypertension   . Type 2 diabetes mellitus with complication, without long-term current use of insulin (HCC)   . Disorientation   . Convulsions (HCC)   . Acute blood loss anemia   . Hypokalemia   . Ileus (HCC)   . Leukocytosis   . Right knee pain   . Cognitive safety issue   . Impulsiveness   . PVC (premature ventricular contraction)   . Abdominal discomfort   . SAH (subarachnoid hemorrhage) (HCC) 06/11/2015  . Emesis   . Subarachnoid hemorrhage (HCC) 06/10/2015  . Hypertension   . Diabetes mellitus without complication [facility])    Hortencia Conradi, PTA  09/19/2015, 4:22 PM [facility] [address] [place], [place], [address] Phone: [phone]   Fax:  [phone]  Name: Haze Antillon MRN: [id number] Date of Birth: 1950-10-15

## 2015-09-19 NOTE — Patient Instructions (Addendum)
  Gaze Stabilization: Sitting    Sit in a chair with your target ("A") 5 feet away at eye-level. While keeping eyes on target ("A") tilt head down slightly head side to side for 30 seconds. Repeat while moving head up and down 30 times. Do __2__ sessions per day. Be sure the "A" remains clear the entire time. If the "A" is blurry or is moving, decrease the speed of your head movement.   Stand in a corner with chair in front for safety  Feet Apart (Compliant Surface) Arm Motion - Eyes Closed    Stand on compliant surface: ___pillow_____, feet shoulder width apart. Close eyes decrease support until you're just tapping with only one finger; close eyes for 10 seconds. Repeat __3-5__ times per session. Do __1-2__ sessions per day.  Copyright  VHI. All rights reserved.  Feet Apart, Head Motion - Eyes Open    With eyes open, feet apart, move head slowly: up and down, side-to-side, and diagonal Repeat _10___ times per session. Do __1-2__ sessions per day. Bring feet closer together if exercise starts to feel easy.  Copyright  VHI. All rights reserved.

## 2015-09-21 ENCOUNTER — Ambulatory Visit: Payer: BC Managed Care – PPO | Admitting: Physical Therapy

## 2015-09-21 ENCOUNTER — Encounter: Payer: Self-pay | Admitting: Physical Therapy

## 2015-09-21 DIAGNOSIS — R2681 Unsteadiness on feet: Secondary | ICD-10-CM

## 2015-09-21 DIAGNOSIS — R42 Dizziness and giddiness: Secondary | ICD-10-CM

## 2015-09-21 DIAGNOSIS — R2689 Other abnormalities of gait and mobility: Secondary | ICD-10-CM

## 2015-09-21 DIAGNOSIS — Z9181 History of falling: Secondary | ICD-10-CM

## 2015-09-21 DIAGNOSIS — R278 Other lack of coordination: Secondary | ICD-10-CM

## 2015-09-22 ENCOUNTER — Other Ambulatory Visit: Payer: Self-pay | Admitting: Physical Medicine and Rehabilitation

## 2015-09-23 NOTE — Therapy (Signed)
[facility] [address] [place], [place], [address] Phone: [phone]   Fax:  [phone]  Physical Therapy Treatment  Patient Details  Name: Darryl [name] MRN: [id number] Date of Birth: 1950/12/11 Referring Provider: Faith Rogue, MD  Encounter Date: 09/21/2015   09/21/15 1536  PT Visits / Re-Eval  Visit Number 3  Number of Visits 17 (eval + 16 visits)  Date for PT Re-Evaluation 11/05/15  Authorization  Authorization Type BCBS; no visit limit, no auth required  PT Time Calculation  PT Start Time 1531  PT Stop Time 1615  PT Time Calculation (min) 44 min  PT - End of Session  Equipment Utilized During Treatment Gait belt  Activity Tolerance Patient tolerated treatment well  Behavior During Therapy [facility] for tasks assessed/performed     Past Medical History  Diagnosis Date  . Hypertension   . Seizures (HCC)   . Diabetes mellitus without complication (HCC)   . Acute deep vein thrombosis (DVT) of left femoral vein (HCC) 07/01/2015    Past Surgical History  Procedure Laterality Date  . Knee surgery    . Cholecystectomy    . Radiology with anesthesia N/A 06/11/2015    Procedure: RADIOLOGY WITH ANESTHESIA;  Surgeon: Lisbeth Renshaw, MD;  Location: [facility] OR;  Service: Radiology;  Laterality: N/A;    There were no vitals filed for this visit.     09/21/15 1534  Symptoms/Limitations  Subjective No new complaints. No falls to report. No changes in left hip pain as well.  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   Patient Stated Goals "I want to get stronger in my right leg; to get rid of the cane; and to work on balance."  Pain Assessment  Currently in Pain? No/denies  Pain Score 0  Pain Location Knee  Pain Orientation Right  2nd Pain Site  Pain Score 5  Pain Location Hip  Pain  Orientation Left  Pain Descriptors / Indicators Aching  Pain Type Acute pain  Pain Onset 1 to 4 weeks ago  Pain Frequency Intermittent  Aggravating Factors  increased activity  Pain Relieving Factors unknown        09/21/15 1548  Balance Exercises: Standing  Standing Eyes Opened Wide (BOA);Limitations;Foam/compliant surface;Other reps (comment)  Standing Eyes Closed Wide (BOA);Foam/compliant surface;3 reps;10 secs;Limitations;Narrow base of support (BOS)  Tandem Stance Eyes closed;Upper extremity support 1;Intermittent upper extremity support;3 reps;10 secs;Limitations  Gait with Head Turns Forward;Retro;Upper extremity support;3 reps;Limitations  Balance Exercises: Standing  Standing Eyes Opened Limitations wide base of support on pillow: head movements all directions  Standing Eyes Closed Limitations on pillow with wide base of support: 10 sec's x 3 reps; narrow base of support on pillow: EC no head movements 10 sec's x 3, EC head movements up<>down, left<>right, diagonals both ways.                          Tandem Stance Limitations on floor with EC 10 sec's x 3 reps each foot forward, no UE support with right foot forward, min fingertip support with left foot forward. min assist for balance.  Tandem Gait Limitations along counter top with cane on opposite side: forward/backward gait with head turns left<>right and nods up<>down x 3 laps each with min guard assist.    \        PT Short  Term Goals - 09/06/15 2046    PT SHORT TERM GOAL #1   Title Pt will perform initial home exercise program with mod I using paper handout to indicate safe HEP compliance. (Target date: 10/04/15)   PT SHORT TERM GOAL #2   Title Pt will increase self-selected gait speed from 1.66 ft/sec > 1.8 ft/sec to indicate decreased risk of recurrent falls.    PT SHORT TERM GOAL #3   Title Pt will increase Berg from 31/56 to 35/56 to indicate improved standing balance.    PT SHORT TERM GOAL #4   Title Pt will  ambulate 200' over level, indoor surfaces with mod I using LRAD to indicate increased safety with household mobility.     PT SHORT TERM GOAL #5   Title Pt will negotiate 14 stairs with 2 rails with mod I to enable progress toward safety/independence accessing second floor of home.    Additional Short Term Goals   Additional Short Term Goals Yes   PT SHORT TERM GOAL #6   Title Pt will negotiate 3 stairs without rails with mod I using LRAD to enable pt to safely access second floor of home.    PT SHORT TERM GOAL #7   Title Pt will negotiate 6 stairs with 1 rail with mod I to enable pt to safely use primary home entrance.    Baseline Of 14 stairs to access second floor, 3 stairs have no rails.    PT SHORT TERM GOAL #8   Title Pt will perform floor transfer with min A using standard chair or mat table to progress toward safety with fall recovery.           PT Long Term Goals - 09/06/15 2057    PT LONG TERM GOAL #1   Title Pt will increase self-selected gait speed from 1.66 ft/sec to > / = 2.26 ft/sec to demonstrate increased efficiency of ambulation.   (Target date: 11/01/15)   PT LONG TERM GOAL #2   Title Pt will improve Berg score from 31/56 to 39/56 to indicate significant improvement in functional standing balance.    PT LONG TERM GOAL #3   Title Pt will ambulate 1,000' over unlevel, paved surfaces with mod I using LRAD with no rest breaks to indicate improved endurance and increased safety with community mobility.   PT LONG TERM GOAL #4   Title Pt will negotiate standard ramp and curb step with mod I using LRAD to indicate increased safety traversing community obstacles.    PT LONG TERM GOAL #5   Title Pt will perform floor transfer with mod I using standard chair or mat table to indicate increased safety with fall recovery.   Additional Long Term Goals   Additional Long Term Goals Yes   PT LONG TERM GOAL #6   Title Pt will perform all above aspects of functional mobility with no more  than 2-point increase in pain rating to indicate decreased impact of pain on functional activity tolerance.        09/21/15 1536  Plan  Clinical Impression Statement Today's skilled session focused on HEP review and balance activites that promoted increased vestibular imput. Pt with increased eye pain at times that subsided with rest and occasional dizziness reported as well that resided with rest. Pt is makiing steady progress toward goals  Pt will benefit from skilled therapeutic intervention in order to improve on the following deficits Postural dysfunction;Abnormal gait;Decreased activity tolerance;Decreased balance;Decreased endurance;Decreased coordination;Dizziness;Decreased knowledge of use of DME;Other (  comment);Pain;Decreased strength;Decreased safety awareness;Impaired perceived functional ability;Impaired vision/preception (Pain will be monitored but not directly addressed due to nature of referral and chronicity of pain.)  Rehab Potential Good  Clinical Impairments Affecting Rehab Potential positive: age, supportive wife; negative: decreased awareness, chronic R knee pain  PT Frequency 2x / week  PT Duration 8 weeks  PT Treatment/Interventions ADLs/Self Care Home Management;Vestibular;DME Instruction;Gait training;Stair training;Functional mobility training;Therapeutic activities;Therapeutic exercise;Balance training;Orthotic Fit/Training;Patient/family education;Neuromuscular re-education;Visual/perceptual remediation/compensation  PT Next Visit Plan Check home exercise for habituation, keeping dizziness < 5/10. Standing balance, gait training. Hip strengthening  Consulted and Agree with Plan of Care Patient;Family member/caregiver  Family Member Consulted wife, [name]     Patient will benefit from skilled therapeutic intervention in order to improve the following deficits and impairments:  Postural dysfunction, Abnormal gait, Decreased activity tolerance, Decreased balance,  Decreased endurance, Decreased coordination, Dizziness, Decreased knowledge of use of DME, Other (comment), Pain, Decreased strength, Decreased safety awareness, Impaired perceived functional ability, Impaired vision/preception (Pain will be monitored but not directly addressed due to nature of referral and chronicity of pain.)  Visit Diagnosis: Other abnormalities of gait and mobility  Unsteadiness on feet  Dizziness and giddiness  History of falling  Other lack of coordination     Problem List Patient Active Problem List   Diagnosis Date Noted  . 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 Falls   . Acute deep vein thrombosis (DVT) of left femoral vein (HCC) 07/01/2015  . Pain   . Poor fluid intake   . Essential hypertension   . Type 2 diabetes mellitus with complication, without long-term current use of insulin (HCC)   . Disorientation   . Convulsions (HCC)   . Acute blood loss anemia   . Hypokalemia   . Ileus (HCC)   . Leukocytosis   . Right knee pain   . Cognitive safety issue   . Impulsiveness   . PVC (premature ventricular contraction)   . Abdominal discomfort   . SAH (subarachnoid hemorrhage) (HCC) 06/11/2015  . Emesis   . Subarachnoid hemorrhage (HCC) 06/10/2015  . Hypertension   . Diabetes mellitus without complication (HCC)    Sallyanne KusterKathy Refugio Mcconico, PTA, [facility] [address], [address] [place], [place] [address] [phone] 09/23/2015, 11:42 PM   Name: Claria Dicelonzo Mcquarrie MRN: [id number] Date of Birth: 1950-10-16

## 2015-09-24 ENCOUNTER — Encounter: Payer: Self-pay | Admitting: Physical Therapy

## 2015-09-24 ENCOUNTER — Ambulatory Visit: Payer: BC Managed Care – PPO | Admitting: Physical Therapy

## 2015-09-24 ENCOUNTER — Ambulatory Visit: Payer: BC Managed Care – PPO | Admitting: Speech Pathology

## 2015-09-24 DIAGNOSIS — R278 Other lack of coordination: Secondary | ICD-10-CM

## 2015-09-24 DIAGNOSIS — R41841 Cognitive communication deficit: Secondary | ICD-10-CM

## 2015-09-24 DIAGNOSIS — R42 Dizziness and giddiness: Secondary | ICD-10-CM

## 2015-09-24 DIAGNOSIS — R2689 Other abnormalities of gait and mobility: Secondary | ICD-10-CM

## 2015-09-24 DIAGNOSIS — Z9181 History of falling: Secondary | ICD-10-CM

## 2015-09-24 DIAGNOSIS — R2681 Unsteadiness on feet: Secondary | ICD-10-CM

## 2015-09-24 NOTE — Therapy (Signed)
[facility] [address] [place], [place], [address] Phone: [phone]   Fax:  [phone]  Speech Language Pathology Treatment  Patient Details  Name: Darryl [name] MRN: [id number] Date of Birth: 04/25/1951 Referring Provider: Dr. Faith Rogue  Encounter Date: 09/24/2015      End of Session - 09/24/15 1332    Visit Number 2   Number of Visits 17   Date for SLP Re-Evaluation 11/01/15   Authorization Type BCBS   SLP Start Time 1148   SLP Stop Time  1233   SLP Time Calculation (min) 45 min      Past Medical History  Diagnosis Date  . Hypertension   . Seizures (HCC)   . Diabetes mellitus without complication (HCC)   . Acute deep vein thrombosis (DVT) of left femoral vein (HCC) 07/01/2015    Past Surgical History  Procedure Laterality Date  . Knee surgery    . Cholecystectomy    . Radiology with anesthesia N/A 06/11/2015    Procedure: RADIOLOGY WITH ANESTHESIA;  Surgeon: Lisbeth Renshaw, MD;  Location: [facility] OR;  Service: Radiology;  Laterality: N/A;    There were no vitals filed for this visit.      Subjective Assessment - 09/24/15 1152    Subjective "I didn't do much with the homework"               ADULT SLP TREATMENT - 09/24/15 1152    General Information   Behavior/Cognition Alert;Cooperative;Pleasant mood   Treatment Provided   Treatment provided Cognitive-Linquistic   Pain Assessment   Pain Assessment 0-10   Pain Score 5    Pain Location right hip   Pain Descriptors / Indicators Aching;Discomfort   Pain Intervention(s) Monitored during session   Cognitive-Linquistic Treatment   Treatment focused on Cognition   Skilled Treatment Pt placed his appointments in his phone and is using phone alarm as reminder to get ready for appointments. He utilized phone to answer questions re: future appointments with supervision cues.  He reports forgetting conversations and phone messages. Pt was also  unaware that I gave him handouts on eval - Instructed pt to get a binder for therapy handouts and bring it in each session, as well as a memory note book to help recall visitors, messages etc. Pt  alternated attention between 3 piles of card sorts each with a different rule, with occasional min to mod cues to attend to all 3 piles - pt mildly impulsive with sort. Attention to details reading and answering time math problems re: bank hours with frequent cues to attend to details on 4/5 questions.    Assessment / Recommendations / Plan   Plan Continue with current plan of care   Progression Toward Goals   Progression toward goals Progressing toward goals          SLP Education - 09/24/15 1328    Education provided Yes   Education Details compensations for memory   Person(s) Educated Patient   Methods Explanation;Handout   Comprehension Verbalized understanding;Need further instruction          SLP Short Term Goals - 09/24/15 1332    SLP SHORT TERM GOAL #1   Title Pt will utlize external aids for recall of scedule, daily tasks and events over 3 sessions.    Time 4   Period Weeks   Status On-going   SLP SHORT TERM GOAL #2   Title Pt will attend to details on moderately complex cognitive linguistic tasks  with rare min A   Time 4   Period Weeks   Status On-going   SLP SHORT TERM GOAL #3   Title Pt will sustain attention to cognitive linguistic tasks in distracting environment with rare min A   Time 4   Period Weeks   Status On-going          SLP Long Term Goals - 09/24/15 1332    SLP LONG TERM GOAL #1   Title Pt will divide attention between 2 moderately complex cognitive linguistic tasks with 85% on each and rare min A.   Time 8   Period Weeks   Status On-going   SLP LONG TERM GOAL #2   Title Pt will utilize compenstory strategies to recall new information (paragraphs, messages, etc) 85% accuracy and rare min A   Time 8   Period Weeks   Status On-going           Plan - 09/24/15 1328    Clinical Impression Statement Mr. Auvil required SLP A for attention to details and alternating attention and error awareness. He is to get a Glass blower/designermemory notebook for therapy handouts and to help recall pertinent daily events. Continue skilled ST to maximize cognition for possible return to work.   Speech Therapy Frequency 2x / week   Treatment/Interventions Cognitive reorganization;SLP instruction and feedback;Internal/external aids;Compensatory strategies;Patient/family education;Functional tasks;Cueing hierarchy   Potential to Achieve Goals Good   Potential Considerations Ability to learn/carryover information   Consulted and Agree with Plan of Care Patient      Patient will benefit from skilled therapeutic intervention in order to improve the following deficits and impairments:   Cognitive communication deficit    Problem List Patient Active Problem List   Diagnosis Date Noted  . 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 Falls   . Acute deep vein thrombosis (DVT) of left femoral vein (HCC) 07/01/2015  . Pain   . Poor fluid intake   . Essential hypertension   . Type 2 diabetes mellitus with complication, without long-term current use of insulin (HCC)   . Disorientation   . Convulsions (HCC)   . Acute blood loss anemia   . Hypokalemia   . Ileus (HCC)   . Leukocytosis   . Right knee pain   . Cognitive safety issue   . Impulsiveness   . PVC (premature ventricular contraction)   . Abdominal discomfort   . SAH (subarachnoid hemorrhage) (HCC) 06/11/2015  . Emesis   . Subarachnoid hemorrhage (HCC) 06/10/2015  . Hypertension   . Diabetes mellitus without complication (HCC)     Jackolyn Geron, Radene JourneyLaura Ann MS, CCC-SLP 09/24/2015, 1:33 PM  [facility] [address] [place], [place], [address] Phone: [phone]   Fax:  [phone]   Name: Darryl Dicelonzo  [name] MRN: [id number] Date of Birth: 1951-03-21

## 2015-09-24 NOTE — Patient Instructions (Signed)
  Get notebook for therapy handouts with PT/ST sections and section with blank paper to write down conversations, messages, visitors etc to help with recall   Cognitive Activities you can do at home:   - Solitaire  - Majong  - Scrabble  - Chess/Checkers  - Crosswords (easy level)  - Education officer, communityUno  - Card Games  - Board Games  - Connect 4  - Simon  - the Costco WholesaleMemory Game  On your computer, tablet or phone:   Soil scientistMemory Game Scrabble pics App Cracker Barrel App Photo Quiz App MixTwo App What's the Word?App

## 2015-09-25 NOTE — Therapy (Signed)
[facility] [address] [place], [place], [address] Phone: [phone]   Fax:  [phone]  Physical Therapy Treatment  Patient Details  Name: Darryl [name] MRN: [id number] Date of Birth: Aug 11, 1950 Referring Provider: Faith RogueZachary Swartz, MD  Encounter Date: 09/24/2015      PT End of Session - 09/24/15 1240    Visit Number 4   Number of Visits 17  eval + 16 visits   Date for PT Re-Evaluation 11/05/15   Authorization Type BCBS; no visit limit, no auth required   PT Start Time 1235   PT Stop Time 1317   PT Time Calculation (min) 42 min   Equipment Utilized During Treatment Gait belt   Activity Tolerance Patient tolerated treatment well   Behavior During Therapy WFL for tasks assessed/performed      Past Medical History  Diagnosis Date  . Hypertension   . Seizures (HCC)   . Diabetes mellitus without complication (HCC)   . Acute deep vein thrombosis (DVT) of left femoral vein (HCC) 07/01/2015    Past Surgical History  Procedure Laterality Date  . Knee surgery    . Cholecystectomy    . Radiology with anesthesia N/A 06/11/2015    Procedure: RADIOLOGY WITH ANESTHESIA;  Surgeon: Lisbeth RenshawNeelesh Nundkumar, MD;  [facility];  Service: Radiology;  Laterality: N/A;    There were no vitals filed for this visit.      Subjective Assessment - 09/24/15 1238    Subjective No new complaints. No falls to report.    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    Patient Stated Goals "I want to get stronger in my right leg; to get rid of the cane; and to work on balance."   Currently in Pain? Yes   Pain Score 1    Pain Location Eye   Pain Orientation Left   Pain Descriptors / Indicators Sore   Pain Type Acute pain   Pain Onset 1 to 4 weeks ago   Pain Frequency Intermittent   Aggravating Factors  exercises, increased activity   Pain Relieving Factors resting, closing eyes   Pain Score 2   Pain Location Hip   Pain Orientation Left   Pain Descriptors / Indicators Aching;Sore   Pain Type Acute pain   Pain Onset 1 to 4 weeks ago   Pain Frequency Intermittent   Aggravating Factors  increased activity   Pain Relieving Factors rest?, mostly not sure             Balance Exercises - 09/24/15 1242    Balance Exercises: Standing   Rockerboard Anterior/posterior;Lateral;EO;EC;Head turns;10 reps;10 seconds   Gait with Head Turns Forward;3 reps;Limitations  with straight cane   Other Standing Exercises along a 50 foot hallway: gait with head nods up<>down x 3 laps (dizziness 3-4/10 afterwards, decreased with rest), head turns left<>right x 3 laps (dizziness 5/10 afterwards, decreased with rest break). min guard to min assist for balance.                               Balance Exercises: Standing   Rebounder Limitations on rocker board both ways in parallel bars: EC for 10 sec's x 3 reps, EO head movements up<>down, left<>right and diagonals both ways x 10  each, min guard to min assist for balance. no increase in dizziness reported.                                      PT Short Term Goals - 09/06/15 2046    PT SHORT TERM GOAL #1   Title Pt will perform initial home exercise program with mod I using paper handout to indicate safe HEP compliance. (Target date: 10/04/15)   PT SHORT TERM GOAL #2   Title Pt will increase self-selected gait speed from 1.66 ft/sec > 1.8 ft/sec to indicate decreased risk of recurrent falls.    PT SHORT TERM GOAL #3   Title Pt will increase Berg from 31/56 to 35/56 to indicate improved standing balance.    PT SHORT TERM GOAL #4   Title Pt will ambulate 200' over level, indoor surfaces with mod I using LRAD to indicate increased safety with household mobility.     PT SHORT TERM GOAL #5   Title Pt will  negotiate 14 stairs with 2 rails with mod I to enable progress toward safety/independence accessing second floor of home.    Additional Short Term Goals   Additional Short Term Goals Yes   PT SHORT TERM GOAL #6   Title Pt will negotiate 3 stairs without rails with mod I using LRAD to enable pt to safely access second floor of home.    PT SHORT TERM GOAL #7   Title Pt will negotiate 6 stairs with 1 rail with mod I to enable pt to safely use primary home entrance.    Baseline Of 14 stairs to access second floor, 3 stairs have no rails.    PT SHORT TERM GOAL #8   Title Pt will perform floor transfer with min A using standard chair or mat table to progress toward safety with fall recovery.           PT Long Term Goals - 09/06/15 2057    PT LONG TERM GOAL #1   Title Pt will increase self-selected gait speed from 1.66 ft/sec to > / = 2.26 ft/sec to demonstrate increased efficiency of ambulation.   (Target date: 11/01/15)   PT LONG TERM GOAL #2   Title Pt will improve Berg score from 31/56 to 39/56 to indicate significant improvement in functional standing balance.    PT LONG TERM GOAL #3   Title Pt will ambulate 1,000' over unlevel, paved surfaces with mod I using LRAD with no rest breaks to indicate improved endurance and increased safety with community mobility.   PT LONG TERM GOAL #4   Title Pt will negotiate standard ramp and curb step with mod I using LRAD to indicate increased safety traversing community obstacles.    PT LONG TERM GOAL #5   Title Pt will perform floor transfer with mod I using standard chair or mat table to indicate increased safety with fall recovery.   Additional Long Term Goals   Additional Long Term Goals Yes   PT LONG TERM GOAL #6   Title Pt will perform all above aspects of functional mobility with no more than 2-point increase in pain rating to indicate decreased impact of pain on functional activity tolerance.           Plan - 09/24/15 1241    Clinical  Impression Statement Skilled session focused on balance with emphasis on increased vestibular imput. No significant reports of  dizziness, when dizziness did occur it resolved quickly with seated rest breaks. Most challenged with complaint surfaces and dynamic gait activities. Pt is making steady progress toward goals.    Rehab Potential Good   Clinical Impairments Affecting Rehab Potential positive: age, supportive wife; negative: decreased awareness, chronic R knee pain   PT Frequency 2x / week   PT Duration 8 weeks   PT Treatment/Interventions ADLs/Self Care Home Management;Vestibular;DME Instruction;Gait training;Stair training;Functional mobility training;Therapeutic activities;Therapeutic exercise;Balance training;Orthotic Fit/Training;Patient/family education;Neuromuscular re-education;Visual/perceptual remediation/compensation   PT Next Visit Plan Keep check home exercise for habituation, keeping dizziness < 5/10. Standing balance, gait training. Hip strengthening   Consulted and Agree with Plan of Care Patient;Family member/caregiver   Family Member Consulted wife, [name]      Patient will benefit from skilled therapeutic intervention in order to improve the following deficits and impairments:  Postural dysfunction, Abnormal gait, Decreased activity tolerance, Decreased balance, Decreased endurance, Decreased coordination, Dizziness, Decreased knowledge of use of DME, Other (comment), Pain, Decreased strength, Decreased safety awareness, Impaired perceived functional ability, Impaired vision/preception (Pain will be monitored but not directly addressed due to nature of referral and chronicity of pain.)  Visit Diagnosis: Other abnormalities of gait and mobility  Unsteadiness on feet  Dizziness and giddiness  History of falling  Other lack of coordination     Problem List Patient Active Problem List   Diagnosis Date Noted  . 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 Falls   . Acute deep vein thrombosis (DVT) of left femoral vein (HCC) 07/01/2015  . Pain   . Poor fluid intake   . Essential hypertension   . Type 2 diabetes mellitus with complication, without long-term current use of insulin (HCC)   . Disorientation   . Convulsions (HCC)   . Acute blood loss anemia   . Hypokalemia   . Ileus (HCC)   . Leukocytosis   . Right knee pain   . Cognitive safety issue   . Impulsiveness   . PVC (premature ventricular contraction)   . Abdominal discomfort   . SAH (subarachnoid hemorrhage) (HCC) 06/11/2015  . Emesis   . Subarachnoid hemorrhage (HCC) 06/10/2015  . Hypertension   . Diabetes mellitus without complication (HCC)     Sallyanne Kuster, PTA, [facility] [address] [place], [place] [address] [phone] 09/25/2015, 12:56 PM   Name: Darryl [name] MRN: [id number] Date of Birth: [birth year]

## 2015-09-27 ENCOUNTER — Encounter: Payer: Self-pay | Admitting: Physical Therapy

## 2015-09-27 ENCOUNTER — Ambulatory Visit: Payer: BC Managed Care – PPO | Admitting: Speech Pathology

## 2015-09-27 ENCOUNTER — Ambulatory Visit: Payer: BC Managed Care – PPO | Admitting: Occupational Therapy

## 2015-09-27 ENCOUNTER — Ambulatory Visit: Payer: BC Managed Care – PPO | Admitting: Physical Therapy

## 2015-09-27 ENCOUNTER — Encounter: Payer: Self-pay | Admitting: Occupational Therapy

## 2015-09-27 DIAGNOSIS — Z9181 History of falling: Secondary | ICD-10-CM

## 2015-09-27 DIAGNOSIS — R2689 Other abnormalities of gait and mobility: Secondary | ICD-10-CM

## 2015-09-27 DIAGNOSIS — R41841 Cognitive communication deficit: Secondary | ICD-10-CM

## 2015-09-27 DIAGNOSIS — I69118 Other symptoms and signs involving cognitive functions following nontraumatic intracerebral hemorrhage: Secondary | ICD-10-CM

## 2015-09-27 DIAGNOSIS — R2681 Unsteadiness on feet: Secondary | ICD-10-CM

## 2015-09-27 DIAGNOSIS — R278 Other lack of coordination: Secondary | ICD-10-CM

## 2015-09-27 DIAGNOSIS — R42 Dizziness and giddiness: Secondary | ICD-10-CM

## 2015-09-27 DIAGNOSIS — M6281 Muscle weakness (generalized): Secondary | ICD-10-CM

## 2015-09-27 DIAGNOSIS — R41842 Visuospatial deficit: Secondary | ICD-10-CM

## 2015-09-27 NOTE — Therapy (Signed)
[facility] [address] [place], [place], [address] Phone: [phone]   Fax:  [phone]  Speech Language Pathology Treatment  Patient Details  Name: Darryl [name] MRN: [id number] Date of Birth: 04/24/1951 Referring Provider: Dr. Faith RogueZachary Swartz  Encounter Date: 09/27/2015      End of Session - 09/27/15 1358    Visit Number 3   Number of Visits 17   Date for SLP Re-Evaluation 11/01/15   Authorization Type BCBS   SLP Start Time 1317   SLP Stop Time  1358   SLP Time Calculation (min) 41 min      Past Medical History  Diagnosis Date  . Hypertension   . Seizures (HCC)   . Diabetes mellitus without complication (HCC)   . Acute deep vein thrombosis (DVT) of left femoral vein (HCC) 07/01/2015    Past Surgical History  Procedure Laterality Date  . Knee surgery    . Cholecystectomy    . Radiology with anesthesia N/A 06/11/2015    Procedure: RADIOLOGY WITH ANESTHESIA;  Surgeon: Lisbeth RenshawNeelesh Nundkumar, MD;  [facility];  Service: Radiology;  Laterality: N/A;    There were no vitals filed for this visit.      Subjective Assessment - 09/27/15 1327    Subjective "I actually did the homework"   Currently in Pain? No/denies               ADULT SLP TREATMENT - 09/27/15 1328    General Information   Behavior/Cognition Alert;Cooperative;Pleasant mood   Treatment Provided   Treatment provided Cognitive-Linquistic   Pain Assessment   Pain Assessment No/denies pain   Cognitive-Linquistic Treatment   Treatment focused on Cognition   Skilled Treatment Reviewed homework - menu correct, ST noted 7 errors on this, with rare min cues for pt to ID and correct errors. Alternating attention faciliated with filling out category matrix and attending to card sort, when matching cards come up, name item on cards with min cues re-attend to category matrix after naming item on card sort. Pt also required visual cues to re-attend  to cognitive tasks after simple conversation. Organization and attention to detail facilitated with organizing data into chart form to add hours/amounts with rare min A for organization - pt to complete this for homework   Assessment / Recommendations / Plan   Plan Continue with current plan of care   Progression Toward Goals   Progression toward goals Progressing toward goals            SLP Short Term Goals - 09/27/15 1358    SLP SHORT TERM GOAL #1   Title Pt will utlize external aids for recall of scedule, daily tasks and events over 3 sessions.    Time 4   Period Weeks   Status On-going   SLP SHORT TERM GOAL #2   Title Pt will attend to details on moderately complex cognitive linguistic tasks with rare min A   Time 4   Period Weeks   Status On-going   SLP SHORT TERM GOAL #3   Title Pt will sustain attention to cognitive linguistic tasks in distracting environment with rare min A   Time 4   Period Weeks   Status On-going          SLP Long Term Goals - 09/27/15 1358    SLP LONG TERM GOAL #1   Title Pt will divide attention between 2 moderately complex cognitive linguistic tasks with 85% on each and rare min A.  Time 8   Period Weeks   Status On-going   SLP LONG TERM GOAL #2   Title Pt will utilize compenstory strategies to recall new information (paragraphs, messages, etc) 85% accuracy and rare min A   Time 8   Period Weeks   Status On-going          Plan - 09/27/15 1354    Clinical Impression Statement [name] required min A for attention to details on homework correction and to alternate attention between 2 cognitive linguistic tasks. He also requires cues for emergent awareness. Pt verbalized surprise at the amount of errors on homework as he checked it 3 times. Continue skilled ST to maximize cognition for possible retrun to work.    Speech Therapy Frequency 2x / week   Treatment/Interventions Cognitive reorganization;SLP instruction and  feedback;Internal/external aids;Compensatory strategies;Patient/family education;Functional tasks;Cueing hierarchy   Potential to Achieve Goals Good   Consulted and Agree with Plan of Care Patient      Patient will benefit from skilled therapeutic intervention in order to improve the following deficits and impairments:   Cognitive communication deficit    Problem List Patient Active Problem List   Diagnosis Date Noted  . 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 Falls   . Acute deep vein thrombosis (DVT) of left femoral vein (HCC) 07/01/2015  . Pain   . Poor fluid intake   . Essential hypertension   . Type 2 diabetes mellitus with complication, without long-term current use of insulin (HCC)   . Disorientation   . Convulsions (HCC)   . Acute blood loss anemia   . Hypokalemia   . Ileus (HCC)   . Leukocytosis   . Right knee pain   . Cognitive safety issue   . Impulsiveness   . PVC (premature ventricular contraction)   . Abdominal discomfort   . SAH (subarachnoid hemorrhage) (HCC) 06/11/2015  . Emesis   . Subarachnoid hemorrhage (HCC) 06/10/2015  . Hypertension   . Diabetes mellitus without complication (HCC)     Lovvorn, Radene Journey MS, CCC-SLP 09/27/2015, 1:59 PM  [facility] [address] [place], [place], [address] Phone: [phone]   Fax:  [phone]   Name: Darryl [name] MRN: [id number] Date of Birth: 1951-01-03

## 2015-09-27 NOTE — Patient Instructions (Signed)
Walking Program:  Begin walking for exercise for 10 minutes, 2 times/day, 5 days/week.   Progress your walking program by adding 2-3 minutes to your routine each week, as tolerated. Be sure to wear good walking shoes, walk in a safe environment and only progress to your tolerance.      TIPS: Use cane for stability, especially with outdoor surfaces and where distractions can occur  Start walking away from home for half the goal time, then turn and walk back the remaining time so to avoid overdoing it  (example- for 10 minute walk, walk out from home for 5 minute's, then turn and walk back the remaining 5 minutes's)  Have someone walk with you for the first week or so for safety due to balance issues at times

## 2015-09-27 NOTE — Therapy (Signed)
[facility] [address] [place], [place], [address] Phone: [phone]   Fax:  [phone]  Physical Therapy Treatment  Patient Details  Name: Darryl [name] MRN: [id number] Date of Birth: 1951/04/05 Referring Provider: Faith Rogue, MD  Encounter Date: 09/27/2015      PT End of Session - 09/27/15 1407    Visit Number 5   Number of Visits 17  eval + 16 visits   Date for PT Re-Evaluation 11/05/15   Authorization Type BCBS; no visit limit, no auth required   PT Start Time 1400   PT Stop Time 1445   PT Time Calculation (min) 45 min   Equipment Utilized During Treatment Gait belt   Activity Tolerance Patient tolerated treatment well   Behavior During Therapy [employer] for tasks assessed/performed      Past Medical History  Diagnosis Date  . Hypertension   . Seizures (HCC)   . Diabetes mellitus without complication (HCC)   . Acute deep vein thrombosis (DVT) of left femoral vein (HCC) 07/01/2015    Past Surgical History  Procedure Laterality Date  . Knee surgery    . Cholecystectomy    . Radiology with anesthesia N/A 06/11/2015    Procedure: RADIOLOGY WITH ANESTHESIA;  Surgeon: Lisbeth Renshaw, MD;  Location: [facility] OR;  Service: Radiology;  Laterality: N/A;    There were no vitals filed for this visit.      Subjective Assessment - 09/27/15 1405    Subjective No new complaints. No falls to report.    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    Patient Stated Goals "I want to get stronger in my right leg; to get rid of the cane; and to work on balance."   Currently in Pain? Yes   Pain Score 2    Pain Location Hip   Pain Orientation Right   Pain Descriptors / Indicators Aching;Other (Comment)  stiffness   Pain Type Chronic pain   Pain Onset More than a month ago   Aggravating Factors   sitting too long, increased gait   Pain Relieving Factors resting   Pain Score 4   Pain Location Hip   Pain Orientation Left   Pain Descriptors / Indicators Aching;Sore   Pain Type Chronic pain   Pain Onset More than a month ago   Pain Frequency Intermittent   Aggravating Factors  prolonged sitting and increased activity   Pain Relieving Factors rest?, not really sure             [facility] Adult PT Treatment/Exercise - 09/27/15 1408    Transfers   Transfers Sit to Stand;Stand to Sit   Sit to Stand 5: Supervision;With upper extremity assist;From bed   Stand to Sit 5: Supervision;With upper extremity assist;To bed   Ambulation/Gait   Ambulation/Gait Yes   Ambulation/Gait Assistance 5: Supervision   Ambulation/Gait Assistance Details no balance issues noted. pt did demo foot scuffing on the outdoor uneven surfaces. this decreased when cued to use cane vs carrying it for safety and to off load LE's for decreaesd pain and increased activity tolerance. Pt verbalized understanding.                                     Ambulation Distance (Feet) 700 Feet   Assistive device Straight  cane   Gait Pattern Step-through pattern;Decreased stride length;Decreased arm swing - left;Shuffle  occasional shuffle when pt carrying cane, not using it   Ambulation Surface Level;Unlevel;Indoor;Outdoor;Paved   Stairs Yes   Stairs Assistance 5: Supervision;4: Min assist;4: Min guard   Stairs Assistance Details (indicate cue type and reason) blocked practice with 2 rails, 1 rail (each side used x 3 reps) and no rails: Mod I for stairs with 2 rails reciprocally, min guard to supervision with 1 rail using reciprocal pattern. min guard to ascend reciprocally without any rails/AD and min assist to descend with no rails reciprocally. with cues to use step to pattern for safety pt progressed to min guard with descending stairs without rails/AD.                          Stair Management Technique One rail Right;One rail  Left;Two rails;No rails;Step to pattern;Alternating pattern;Forwards   Number of Stairs 4  repeated with blocked practice           Balance Exercises - 09/27/15 1436    Balance Exercises: Standing   Gait with Head Turns Forward;2 reps;Limitations   Other Standing Exercises along a 50 foot hallway: gait with head movements left<>right x 2 laps with no dizziness reported, with head movements up<>down with no dizziness reported, and with head movements in diagonal directions x 2 laps each way with 4-5/10 dizziness after the 2 laps each. Dizziness decreased with seated rest break. Mild veering noted with all gait activities, no overt loss of balance noted.                                                               PT Education - 09/27/15 1425    Education provided Yes   Education Details HEP: walking progream   Person(s) Educated Patient   Methods Explanation;Demonstration;Handout   Comprehension Verbalized understanding;Returned demonstration          PT Short Term Goals - 09/06/15 2046    PT SHORT TERM GOAL #1   Title Pt will perform initial home exercise program with mod I using paper handout to indicate safe HEP compliance. (Target date: 10/04/15)   PT SHORT TERM GOAL #2   Title Pt will increase self-selected gait speed from 1.66 ft/sec > 1.8 ft/sec to indicate decreased risk of recurrent falls.    PT SHORT TERM GOAL #3   Title Pt will increase Berg from 31/56 to 35/56 to indicate improved standing balance.    PT SHORT TERM GOAL #4   Title Pt will ambulate 200' over level, indoor surfaces with mod I using LRAD to indicate increased safety with household mobility.     PT SHORT TERM GOAL #5   Title Pt will negotiate 14 stairs with 2 rails with mod I to enable progress toward safety/independence accessing second floor of home.    Additional Short Term Goals   Additional Short Term Goals Yes   PT SHORT TERM GOAL #6   Title Pt will negotiate 3 stairs without rails with mod I  using LRAD to enable pt to safely access second floor of home.    PT SHORT TERM GOAL #7   Title Pt will negotiate 6 stairs with 1 rail with mod I to  enable pt to safely use primary home entrance.    Baseline Of 14 stairs to access second floor, 3 stairs have no rails.    PT SHORT TERM GOAL #8   Title Pt will perform floor transfer with min A using standard chair or mat table to progress toward safety with fall recovery.           PT Long Term Goals - 09/06/15 2057    PT LONG TERM GOAL #1   Title Pt will increase self-selected gait speed from 1.66 ft/sec to > / = 2.26 ft/sec to demonstrate increased efficiency of ambulation.   (Target date: 11/01/15)   PT LONG TERM GOAL #2   Title Pt will improve Berg score from 31/56 to 39/56 to indicate significant improvement in functional standing balance.    PT LONG TERM GOAL #3   Title Pt will ambulate 1,000' over unlevel, paved surfaces with mod I using LRAD with no rest breaks to indicate improved endurance and increased safety with community mobility.   PT LONG TERM GOAL #4   Title Pt will negotiate standard ramp and curb step with mod I using LRAD to indicate increased safety traversing community obstacles.    PT LONG TERM GOAL #5   Title Pt will perform floor transfer with mod I using standard chair or mat table to indicate increased safety with fall recovery.   Additional Long Term Goals   Additional Long Term Goals Yes   PT LONG TERM GOAL #6   Title Pt will perform all above aspects of functional mobility with no more than 2-point increase in pain rating to indicate decreased impact of pain on functional activity tolerance.            Plan - 09/27/15 1407    Clinical Impression Statement Today's session focused on gait and stair negotiation moslty, with a little balance toward the end of session. Pt tolerated well, only complaint was of dizziness with gait with head movments in diagonal pattern. Added walking program to pt's HEP. Pt is  making steady progress toward goals.    Rehab Potential Good   Clinical Impairments Affecting Rehab Potential positive: age, supportive wife; negative: decreased awareness, chronic R knee pain   PT Frequency 2x / week   PT Duration 8 weeks   PT Treatment/Interventions ADLs/Self Care Home Management;Vestibular;DME Instruction;Gait training;Stair training;Functional mobility training;Therapeutic activities;Therapeutic exercise;Balance training;Orthotic Fit/Training;Patient/family education;Neuromuscular re-education;Visual/perceptual remediation/compensation   PT Next Visit Plan Keep check home exercise for habituation, keeping dizziness < 5/10. Standing balance, gait training. Hip strengthening   PT Home Exercise Plan x1 gaze, corner balance, walking program   Consulted and Agree with Plan of Care Patient;Family member/caregiver   Family Member Consulted wife, [name]      Patient will benefit from skilled therapeutic intervention in order to improve the following deficits and impairments:  Postural dysfunction, Abnormal gait, Decreased activity tolerance, Decreased balance, Decreased endurance, Decreased coordination, Dizziness, Decreased knowledge of use of DME, Other (comment), Pain, Decreased strength, Decreased safety awareness, Impaired perceived functional ability, Impaired vision/preception (Pain will be monitored but not directly addressed due to nature of referral and chronicity of pain.)  Visit Diagnosis: Other abnormalities of gait and mobility  Unsteadiness on feet  Dizziness and giddiness  History of falling  Other lack of coordination    Problem List Patient Active Problem List   Diagnosis Date Noted  . 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 Falls   . Acute deep vein thrombosis (  DVT) of left femoral vein (HCC) 07/01/2015  . Pain   . Poor fluid intake   . Essential hypertension   . Type 2 diabetes mellitus  with complication, without long-term current use of insulin (HCC)   . Disorientation   . Convulsions (HCC)   . Acute blood loss anemia   . Hypokalemia   . Ileus (HCC)   . Leukocytosis   . Right knee pain   . Cognitive safety issue   . Impulsiveness   . PVC (premature ventricular contraction)   . Abdominal discomfort   . SAH (subarachnoid hemorrhage) (HCC) 06/11/2015  . Emesis   . Subarachnoid hemorrhage (HCC) 06/10/2015  . Hypertension   . Diabetes mellitus without complication (HCC)     Sallyanne KusterKathy Thayne Cindric, PTA, [facility] [address], [address] [place], [place] [address] [phone] 09/27/2015, 3:11 PM   Name: Darryl [name] MRN: [id number] Date of Birth: 1951/03/09

## 2015-09-27 NOTE — Therapy (Signed)
[facility] [address] [place], [place], [address] Phone: [phone]   Fax:  [phone]  Occupational Therapy Evaluation  Patient Details  Name: Darryl [name] MRN: [id number] Date of Birth: Feb 16, 1951 Referring Provider: Dr. Faith Rogue  Encounter Date: 09/27/2015      OT End of Session - 09/27/15 1954    Visit Number 1   Number of Visits 13   Date for OT Re-Evaluation 11/09/15   Authorization Type BCBS, no visit limit, no auth needed   OT Start Time 1105   OT Stop Time 1150   OT Time Calculation (min) 45 min   Activity Tolerance Patient tolerated treatment well   Behavior During Therapy [facility] for tasks assessed/performed      Past Medical History  Diagnosis Date  . Hypertension   . Seizures (HCC)   . Diabetes mellitus without complication (HCC)   . Acute deep vein thrombosis (DVT) of left femoral vein (HCC) 07/01/2015    Past Surgical History  Procedure Laterality Date  . Knee surgery    . Cholecystectomy    . Radiology with anesthesia N/A 06/11/2015    Procedure: RADIOLOGY WITH ANESTHESIA;  Surgeon: Lisbeth Renshaw, MD;  Location: [facility] OR;  Service: Radiology;  Laterality: N/A;    There were no vitals filed for this visit.      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 No/denies  none currently   Pain Score 0-No pain  R hip/knee   Pain Location --  R hip, knee   Pain Orientation Right   Pain Descriptors / Indicators --  stiffness   Pain Type Chronic pain   Pain Onset 1 to 4 weeks ago   Pain Frequency Intermittent   Aggravating Factors  sitting too long, lying on it   Pain Relieving Factors resting   Effect of Pain on  Daily Activities OT will not address leg pain due to nature and location           [facility] OT Assessment - 09/27/15 0001    Assessment   Diagnosis SAH   Referring Provider Dr. Faith Rogue   Onset Date 06/10/15   Assessment hospitalized 06/10/15-07/24/15   Prior Therapy CIR   Precautions   Precautions Fall  hx of seizure   Restrictions   Weight Bearing Restrictions No   Balance Screen   Has the patient fallen in the past 6 months Yes   How many times? "a bunch"  in hospital, stairs, picking up something from floor   Has the patient had a decrease in activity level because of a fear of falling?  --  last fall approx 2 weeks ago   Home  Environment   Family/patient expects to be discharged to: Private residence   Living Arrangements Spouse/significant other   Type of Home House   Home Layout Two level   Lives With Spouse   Prior Function   Level of Independence Independent   Vocation Full time employment   Scientist, clinical (histocompatibility and immunogenetics) at [employer]   Leisure likes [employer] basketball, watching TV, and to go to church   ADL   ADL comments Pt reports performing BADLs mod I.  Pt reports tremors affect ability to drink and that pt uses both hands.   IADL   Prior Level of Function Light Housekeeping pt did yardwork, wife did cleaning tasks   Light Housekeeping --  pt is not performing yardwork   Prior Level of Function Meal Prep wife performed   Prior Level of Function International aid/development worker Relies on family or friends for transportation   Prior Level of Function Financial Management pt performed   Landscape architect Manages day-to-day purchases, but needs help with banking, major purchases, etc.  has not done budgeting yet   Mobility   Mobility Status History of falls   Mobility Status Comments pt reports using single-point cane prn in the home and in community   Written Expression   Dominant Hand Right   Handwriting 90% legible  cramped writing,  pt reports significant change from previous   Vision - History   Baseline Vision Bifocals   Vision Assessment   Eye Alignment Impaired (comment)   Vision Assessment Vision impaired  _ to be further tested in functional context   Ocular Range of Motion Within Functional Limits   Tracking/Visual Pursuits Decreased smoothness of horizontal tracking  with R eye, nystagmus inconsistently moving R eye to midline   Saccades Within functional limits   Convergence --   Visual Fields --  ?accuracy as inconsistent response   Diplopia Assessment Only with left gaze  towards end range   Comment pt reports fatigue with eye movements and blurriness with fatigue, occasional L eye pain/fatigue   Cognition   Overall Cognitive Status Impaired/Different from baseline  See Speech Therapy notes for details   Area of Impairment Memory;Attention   Current Attention Level Alternating;Divided   Attention Comments OT will likely not directly address cognition as Speech Therapy is addressing pt's cognitive concerns    Memory Decreased short-term memory   Sensation   Light Touch Not tested   Coordination   9 Hole Peg Test Right;Left   Right 9 Hole Peg Test 35.41  with 1 drop   Left 9 Hole Peg Test 29.91   Tremors Pt demo resting hand tremors in both hands and reports intermittent tremors with functional tasks such as drinking coffee   ROM / Strength   AROM / PROM / Strength AROM;Strength   AROM   Overall AROM  Within functional limits for tasks performed   Strength   Overall Strength Deficits   Overall Strength Comments Shoulder strength grossly 5/5 with LUE, grossly 4/5 with RUE   Hand Function   Right Hand Grip (lbs) 52   Left Hand Grip (lbs) 33                         OT Education - 09/27/15 1259    Education provided --   Education Details eval results, POC; recommendation pt not pick up things from floor due to multiple falls; recommended that pt have wife check after him for  financial management tasks due to cognitive deficits   Person(s) Educated Patient   Methods Explanation   Comprehension Verbalized understanding          OT Short Term Goals - 09/27/15 2030    OT SHORT TERM GOAL #1   Title Pt will verbalize understanding of adaptive/compensation strategies for ADLs (including compensation strategies for writing and tremors).--check STGs 10/27/15   Time 4   Period Weeks   Status New   OT SHORT TERM GOAL #  2   Title Pt will perform a variety of tabletop scanning with >95% accuracy for IADLs.   Time 4   Period Weeks   Status New   OT SHORT TERM GOAL #3   Title Pt will improve R hand coordination for ADLs as shown by improving time on 9-hole peg test by at least 5sec.   Baseline 35.41sec   Time 4   Period Weeks   Status New           OT Long Term Goals - 09/27/15 2012    OT LONG TERM GOAL #1   Title Pt will be independent with HEP for coordination, R shoulder, and L hand strengthening.--check LTGs 11/09/15   Time 6   Period Weeks   Status New   OT LONG TERM GOAL #2   Title Pt will perform environmental scanning with at least 95% accuracy in busy environment for improved safety for ADLs.   Time 6   Period Weeks   Status New   OT LONG TERM GOAL #3   Title Pt will write at least 5 sentences with good legibility and good letter/word spacing.   Baseline --   Time 6   Period Weeks   Status New   OT LONG TERM GOAL #4   Title Pt will demo at least 40lbs L grip strength for ADLs.   Baseline 33lbs   Time 6   Period Weeks   Status New               Plan - 09/27/15 1957    Clinical Impression Statement Pt is a 65 y.o. 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  Pt with PMH that includes seizure disorder, HTN, DM, chronic knee pain.  Pt was independent and working full time as an Airline pilot  prior to hospitalization.  Per pt, he was undergoing cardiac work-up prior to anticipated R knee surgery for chronic pain prior to  hospitalization.  Pt presents with decr R shoulder strength, decr L hand strength, decr coordination, visual deficits, decr balance for ADLs, and cognitive deficits.  Pt would benefit from occupational therapy to improve strength, coordination, vision prn, and instruct in strategies for improved ADL/IADL performance.     Rehab Potential Good   Clinical Impairments Affecting Rehab Potential Memory deficits   OT Frequency 2x / week   OT Duration 6 weeks  +eval   OT Treatment/Interventions Self-care/ADL training;DME and/or AE instruction;Energy conservation;Building services engineer;Therapeutic exercises;Patient/family education;Visual/perceptual remediation/compensation;Cognitive remediation/compensation;Therapeutic exercise;Moist Heat;Cryotherapy;Manual Therapy;Passive range of motion;Therapeutic activities;Balance training;Neuromuscular education   Plan writing strategies, HEP for coordination and R shoulder strengthening   Recommended Other Services pt is receiving PT and Speech therapy for cognitive deficits   Consulted and Agree with Plan of Care Patient      Patient will benefit from skilled therapeutic intervention in order to improve the following deficits and impairments:  Decreased coordination, Impaired vision/preception, Decreased balance, Decreased cognition, Decreased knowledge of precautions, Decreased safety awareness, Impaired UE functional use, Decreased strength, Decreased knowledge of use of DME  Visit Diagnosis: Other lack of coordination  Unsteadiness on feet  Muscle weakness (generalized)  Other symptoms and signs involving cognitive functions following nontraumatic intracerebral hemorrhage  Visuospatial deficit    Problem List Patient Active Problem List   Diagnosis Date Noted  . 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 Falls   . Acute deep vein thrombosis (DVT) of left femoral vein (HCC) 07/01/2015  .  Pain   .  Poor fluid intake   . Essential hypertension   . Type 2 diabetes mellitus with complication, without long-term current use of insulin (HCC)   . Disorientation   . Convulsions (HCC)   . Acute blood loss anemia   . Hypokalemia   . Ileus (HCC)   . Leukocytosis   . Right knee pain   . Cognitive safety issue   . Impulsiveness   . PVC (premature ventricular contraction)   . Abdominal discomfort   . SAH (subarachnoid hemorrhage) (HCC) 06/11/2015  . Emesis   . Subarachnoid hemorrhage (HCC) 06/10/2015  . Hypertension   . Diabetes mellitus without complication [facility](HCC)     Banner Payson RegionalFREEMAN,Karianne Nogueira 09/27/2015, 8:43 PM  [facility] [address] [place], [place], [address] Phone: [phone]   Fax:  [phone]  Name: Claria Dicelonzo Asa MRN: [id number] Date of Birth: [birth year]  Willa FraterAngela Haydon Kalmar, OTR/L [facility] [address] [place], [place]  [phone] phone [phone] 09/27/2015 8:44 PM

## 2015-10-02 ENCOUNTER — Ambulatory Visit: Payer: BC Managed Care – PPO | Attending: Family Medicine | Admitting: Physical Therapy

## 2015-10-02 ENCOUNTER — Ambulatory Visit: Payer: BC Managed Care – PPO | Admitting: Speech Pathology

## 2015-10-02 DIAGNOSIS — R2689 Other abnormalities of gait and mobility: Secondary | ICD-10-CM

## 2015-10-02 DIAGNOSIS — R278 Other lack of coordination: Secondary | ICD-10-CM | POA: Diagnosis present

## 2015-10-02 DIAGNOSIS — R42 Dizziness and giddiness: Secondary | ICD-10-CM | POA: Diagnosis present

## 2015-10-02 DIAGNOSIS — I69118 Other symptoms and signs involving cognitive functions following nontraumatic intracerebral hemorrhage: Secondary | ICD-10-CM | POA: Diagnosis present

## 2015-10-02 DIAGNOSIS — R41841 Cognitive communication deficit: Secondary | ICD-10-CM | POA: Insufficient documentation

## 2015-10-02 DIAGNOSIS — R2681 Unsteadiness on feet: Secondary | ICD-10-CM | POA: Diagnosis not present

## 2015-10-02 DIAGNOSIS — R41842 Visuospatial deficit: Secondary | ICD-10-CM | POA: Diagnosis present

## 2015-10-02 DIAGNOSIS — M6281 Muscle weakness (generalized): Secondary | ICD-10-CM | POA: Diagnosis present

## 2015-10-02 NOTE — Therapy (Signed)
[facility] [address] [place], [place], [address] Phone: [phone]   Fax:  [phone]  Physical Therapy Treatment  Patient Details  Name: Darryl [name] MRN: [id number] Date of Birth: 1950/11/11 Referring Provider: Alger Simons, MD  Encounter Date: 10/02/2015      PT End of Session - 10/02/15 2126    Visit Number 6   Number of Visits 17   Date for PT Re-Evaluation 11/05/15   Authorization Type BC[employer]; no visit limit, no auth required   PT Start Time 1405   PT Stop Time 1448   PT Time Calculation (min) 43 min   Equipment Utilized During Treatment Gait belt   Activity Tolerance Patient tolerated treatment well   Behavior During Therapy [facility] for tasks assessed/performed      Past Medical History  Diagnosis Date  . Hypertension   . Seizures ([place])   . Diabetes mellitus without complication ([place])   . Acute deep vein thrombosis (DVT) of left femoral vein ([place]) 07/01/2015    Past Surgical History  Procedure Laterality Date  . Knee surgery    . Cholecystectomy    . Radiology with anesthesia N/A 06/11/2015    Procedure: RADIOLOGY WITH ANESTHESIA;  Surgeon: [name], MD;  Location: [facility];  Service: Radiology;  Laterality: N/A;    There were no vitals filed for this visit.      Subjective Assessment - 10/02/15 1409    Subjective Pt reports no falls. 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    Patient Stated Goals "I want to get stronger in my right leg; to get rid of the cane; and to work on balance."   Currently in Pain? Yes   Pain Score 2    Pain Location Leg   Pain Orientation Right;Proximal;Medial  inner thigh   Pain Descriptors / Indicators Aching   Pain  Type Acute pain   Pain Onset In the past 7 days   Pain Frequency Intermittent   Aggravating Factors  when doing balance exercises   Pain Relieving Factors rest   Multiple Pain Sites No                         [facility] Adult PT Treatment/Exercise - 10/02/15 0001    Transfers   Transfers Sit to Stand;Stand to Sit   Sit to Stand 7: Independent   Stand to Sit 7: Independent   Ambulation/Gait   Ambulation/Gait Yes   Ambulation/Gait Assistance 5: Supervision;6: Modified independent (Device/Increase time)   Ambulation/Gait Assistance Details (S) without AD due to intermittent L toe catch, pt occasionally stepping off sidewalk into grass on L side.   Ambulation Distance (Feet) 1100 Feet   Assistive device None   Gait Pattern Step-through pattern;Decreased stride length;Decreased arm swing - left;Shuffle;Poor foot clearance - left  intermittent L toe catch during outdoor mobility w/o AD   Ambulation Surface Level;Unlevel;Indoor;Outdoor;Paved;Grass   Gait velocity 3.18 ft/sec   Stairs Yes   Stairs Assistance 6: Modified independent (Device/Increase time)   Stairs Assistance Details (indicate cue type and reason) x16 stairs with single rail; x8 stairs with B rails with mod I, x4 stairs without rails with (S)   Stair Management Technique One  rail Right;Two rails;Alternating pattern;Forwards   Number of Stairs 28  consecutively; no rest break   Height of Stairs 6   Ramp 6: Modified independent (Device);5: Supervision   Ramp Details (indicate cue type and reason) mod I with SPC; (S) without AD   Curb 6: Modified independent (Device/increase time);5: Supervision   Curb Details (indicate cue type and reason) mod I with SPC; (S) without AD   Berg Balance Test   Sit to Stand Able to stand without using hands and stabilize independently   Standing Unsupported Able to stand safely 2 minutes   Sitting with Back Unsupported but Feet Supported on Floor or Stool Able to sit safely and  securely 2 minutes   Stand to Sit Sits safely with minimal use of hands   Transfers Able to transfer safely, minor use of hands   Standing Unsupported with Eyes Closed Able to stand 10 seconds safely   Standing Ubsupported with Feet Together Able to place feet together independently and stand 1 minute safely   From Standing, Reach Forward with Outstretched Arm Can reach confidently >25 cm (10")   From Standing Position, Pick up Object from Floor Able to pick up shoe safely and easily   From Standing Position, Turn to Look Behind Over each Shoulder Looks behind from both sides and weight shifts well   Turn 360 Degrees Able to turn 360 degrees safely one side only in 4 seconds or less   Standing Unsupported, Alternately Place Feet on Step/Stool Able to stand independently and safely and complete 8 steps in 20 seconds   Standing Unsupported, One Foot in Front Able to place foot tandem independently and hold 30 seconds   Standing on One Leg Able to lift leg independently and hold 5-10 seconds   Total Score 54                PT Education - 10/02/15 2124    Education provided Yes   Education Details PT goals, findings, progress. Plan to assess FGA at next session and write goal accordingly.    Person(s) Educated Patient   Methods Explanation   Comprehension Verbalized understanding          PT Short Term Goals - 10/02/15 1422    PT SHORT TERM GOAL #1   Title Pt will perform initial home exercise program with mod I using paper handout to indicate safe HEP compliance. (Target date: 10/04/15)   Status On-going   PT SHORT TERM GOAL #2   Title Pt will increase self-selected gait speed from 1.66 ft/sec > 1.8 ft/sec to indicate decreased risk of recurrent falls.    Baseline 5/2: gait velocity = 3.18 ft/sec   Status Achieved   PT SHORT TERM GOAL #3   Title Pt will increase Berg from 31/56 to 35/56 to indicate improved standing balance.    Baseline 5/2: Berg score = 54/56.   Status  Achieved   PT SHORT TERM GOAL #4   Title Pt will ambulate 200' over level, indoor surfaces with mod I using LRAD to indicate increased safety with household mobility.     Status Achieved   PT SHORT TERM GOAL #5   Title Pt will negotiate 14 stairs with 2 rails with mod I to enable progress toward safety/independence accessing second floor of home.    Baseline Met 5/2.   Status Achieved   PT SHORT TERM GOAL #6   Title Pt will negotiate 3 stairs without rails with mod I using LRAD to  enable pt to safely access second floor of home.    Baseline Required (S) on 5/2.   Status On-going   PT SHORT TERM GOAL #7   Title Pt will negotiate 6 stairs with 1 rail with mod I to enable pt to safely use primary home entrance.   Of 14 stairs to access second floor, 3 stairs have no rails.    Baseline Met 5/2.   Status Achieved   PT SHORT TERM GOAL #8   Title Pt will perform floor transfer with min A using standard chair or mat table to progress toward safety with fall recovery.   Baseline Met 5/2.   Status Achieved           PT Long Term Goals - 10/02/15 1431    PT LONG TERM GOAL #1   Title Pt will increase self-selected gait speed from 1.66 ft/sec to > / = 2.26 ft/sec to demonstrate increased efficiency of ambulation.   (Target date: 11/01/15)   Baseline 5/2: gait velocity = 3.18 ft/sec   Status Achieved   PT LONG TERM GOAL #2   Title Pt will improve Berg score from 31/56 to 39/56 to indicate significant improvement in functional standing balance.    Baseline 5/2: [name] = 54/56   Status Achieved   PT LONG TERM GOAL #3   Title Pt will independently ambulate 1,000' over unlevel, paved surfaces with no rest breaks to indicate improved endurance and increased safety with community mobility.   Baseline 5/2: REVISED from mod I to independent without AD due to pt progress.   Status Revised   PT LONG TERM GOAL #4   Title Pt will independently negotiate standard ramp and curb step to indicate increased  safety traversing community obstacles.    Baseline 5/2: REVISED from mod I to independent without AD due to pt progress.   Status Revised   PT LONG TERM GOAL #5   Title Pt will perform floor transfer with mod I using standard chair or mat table to indicate increased safety with fall recovery.   Baseline Met 5/2.   Status Achieved   PT LONG TERM GOAL #6   Title Pt will perform all above aspects of functional mobility with no more than 2-point increase in pain rating to indicate decreased impact of pain on functional activity tolerance.   Status On-going               Plan - 10/02/15 2137    Clinical Impression Statement Pt has met majority of short and long term goals. Berg score improved markedly, from 31/56 to 54/56, since evaluation. Functional endurnance much improved, as pt able to ambulate > 1,000' and negotiate 28 stairs consecutively without rest break. Revised LTGs for community mobility from mod I to independent due to significant pt progress. Will plan to perform FGA at next session and set goal accordingly.    Rehab Potential Good   Clinical Impairments Affecting Rehab Potential positive: age, supportive wife; negative: decreased awareness, chronic R knee pain   PT Frequency 2x / week   PT Duration 8 weeks   PT Treatment/Interventions ADLs/Self Care Home Management;Vestibular;DME Instruction;Gait training;Stair training;Functional mobility training;Therapeutic activities;Therapeutic exercise;Balance training;Orthotic Fit/Training;Patient/family education;Neuromuscular re-education;Visual/perceptual remediation/compensation   PT Next Visit Plan Assess FGA and set goal accordingly. Add dynamic gait to HEP. Keep check home exercise for habituation, keeping dizziness < 5/10. Standing balance, gait training. Hip strengthening   Consulted and Agree with Plan of Care Patient      Patient  will benefit from skilled therapeutic intervention in order to improve the following deficits  and impairments:  Postural dysfunction, Abnormal gait, Decreased activity tolerance, Decreased balance, Decreased endurance, Decreased coordination, Dizziness, Decreased knowledge of use of DME, Other (comment), Pain, Decreased strength, Decreased safety awareness, Impaired perceived functional ability, Impaired vision/preception (Pain will be monitored but not directly addressed due to chronicity of pain.)  Visit Diagnosis: Unsteadiness on feet  Other abnormalities of gait and mobility  Dizziness and giddiness     Problem List Patient Active Problem List   Diagnosis Date Noted  . 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 Falls   . Acute deep vein thrombosis (DVT) of left femoral vein ([place]) 07/01/2015  . Pain   . Poor fluid intake   . Essential hypertension   . Type 2 diabetes mellitus with complication, without long-term current use of insulin ([place])   . Disorientation   . Convulsions ([place])   . Acute blood loss anemia   . Hypokalemia   . Ileus ([place])   . Leukocytosis   . Right knee pain   . Cognitive safety issue   . Impulsiveness   . PVC (premature ventricular contraction)   . Abdominal discomfort   . SAH (subarachnoid hemorrhage) ([place]) 06/11/2015  . Emesis   . Subarachnoid hemorrhage ([place]) 06/10/2015  . Hypertension   . Diabetes mellitus without complication ([place])     Billie Ruddy, PT, DPT [facility] [address] [place], [place], [address] Phone: [phone]   Fax:  [phone] 10/02/2015, 9:40 PM  Name: Darryl [name] MRN: [id number] Date of Birth: April 10, 1951

## 2015-10-02 NOTE — Therapy (Signed)
[facility] [address] [place], [place], [address] Phone: [phone]   Fax:  [phone]  Speech Language Pathology Treatment  Patient Details  Name: Darryl [name] MRN: [id number] Date of Birth: [birth year] Referring Provider: Dr. Faith RogueZachary Swartz  Encounter Date: 10/02/2015      End of Session - 10/02/15 1410    Visit Number 4   Number of Visits 17   Date for SLP Re-Evaluation 11/01/15   SLP Start Time 1316   SLP Stop Time  1404   SLP Time Calculation (min) 48 min      Past Medical History  Diagnosis Date  . Hypertension   . Seizures ([facility])   . Diabetes mellitus without complication ([facility])   . Acute deep vein thrombosis (DVT) of left femoral vein ([facility]) 07/01/2015    Past Surgical History  Procedure Laterality Date  . Knee surgery    . Cholecystectomy    . Radiology with anesthesia N/A 06/11/2015    Procedure: RADIOLOGY WITH ANESTHESIA;  Surgeon: Lisbeth RenshawNeelesh Nundkumar, MD;  [facility];  Service: Radiology;  Laterality: N/A;    There were no vitals filed for this visit.      Subjective Assessment - 10/02/15 1322    Subjective "I had the homework but I forgot it in the car"   Currently in Pain? No/denies               ADULT SLP TREATMENT - 10/02/15 1323    General Information   Behavior/Cognition Alert;Cooperative;Pleasant mood   Treatment Provided   Treatment provided Cognitive-Linquistic   Pain Assessment   Pain Assessment No/denies pain   Cognitive-Linquistic Treatment   Treatment focused on Cognition   Skilled Treatment Pt forgot homework. Facilitated attention to details and organization placing stores in appropiate mall plots according to deduction clues with extended time and occasional min A for attention to details - pt miss read directions several times, missing the word "not" despite verbal cues. Pt utlized phone to answer questions re: his schedule.  Instructed pt to put a sticky note  on the dash of the card that reads "therapy notebook" to remind him to bring homework.   Assessment / Recommendations / Plan   Plan Continue with current plan of care   Progression Toward Goals   Progression toward goals Progressing toward goals            SLP Short Term Goals - 10/02/15 1409    SLP SHORT TERM GOAL #1   Title Pt will utlize external aids for recall of scedule, daily tasks and events over 3 sessions.    Time 3   Period Weeks   Status On-going   SLP SHORT TERM GOAL #2   Title Pt will attend to details on moderately complex cognitive linguistic tasks with rare min A   Time 3   Period Weeks   Status On-going   SLP SHORT TERM GOAL #3   Title Pt will sustain attention to cognitive linguistic tasks in distracting environment with rare min A   Time 3   Period Weeks   Status On-going          SLP Long Term Goals - 10/02/15 1410    SLP LONG TERM GOAL #1   Title Pt will divide attention between 2 moderately complex cognitive linguistic tasks with 85% on each and rare min A.   Time 7   Period Weeks   Status On-going   SLP LONG TERM GOAL #2  Title Pt will utilize compenstory strategies to recall new information (paragraphs, messages, etc) 85% accuracy and rare min A   Time 7   Period Weeks   Status On-going          Plan - 10/02/15 1403    Clinical Impression Statement Mr. Wende Bushy continues to required min A for attention to details on mildly complex cognitive llinguistic tasks and for memory. He has forgotten his homework twice - verbal instructions/cues to use external reminders.    Speech Therapy Frequency 2x / week   Treatment/Interventions Cognitive reorganization;SLP instruction and feedback;Internal/external aids;Compensatory strategies;Patient/family education;Functional tasks;Cueing hierarchy   Potential to Achieve Goals Good   Potential Considerations Ability to learn/carryover information   Consulted and Agree with Plan of Care Patient       Patient will benefit from skilled therapeutic intervention in order to improve the following deficits and impairments:   Cognitive communication deficit    Problem List Patient Active Problem List   Diagnosis Date Noted  . 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 Falls   . Acute deep vein thrombosis (DVT) of left femoral vein ([facility]) 07/01/2015  . Pain   . Poor fluid intake   . Essential hypertension   . Type 2 diabetes mellitus with complication, without long-term current use of insulin ([facility])   . Disorientation   . Convulsions ([facility])   . Acute blood loss anemia   . Hypokalemia   . Ileus ([facility])   . Leukocytosis   . Right knee pain   . Cognitive safety issue   . Impulsiveness   . PVC (premature ventricular contraction)   . Abdominal discomfort   . SAH (subarachnoid hemorrhage) ([facility]) 06/11/2015  . Emesis   . Subarachnoid hemorrhage ([facility]) 06/10/2015  . Hypertension   . Diabetes mellitus without complication ([facility])     Helaine Yackel, Radene Journey MS, CCC-SLP 10/02/2015, 2:12 PM  [facility] [address] [place], [place], [address] Phone: [phone]   Fax:  [phone]   Name: Lovelle Lema MRN: [id number] Date of Birth: 1950-10-31

## 2015-10-02 NOTE — Patient Instructions (Signed)
  Put sticky note on dash that says "Therapy Notebook" to help you remember to bring notebook and homework

## 2015-10-03 ENCOUNTER — Telehealth: Payer: Self-pay | Admitting: *Deleted

## 2015-10-03 NOTE — Telephone Encounter (Signed)
Mr Darryl [name] calling for refill on medications.  He did not specify.  I have left a message to call us back and specify. I did include on the message that most of his medications will be refilled by his primary care from now on but that he can leave us their names and we will let him know if it is something that would continue from this office.

## 2015-10-04 ENCOUNTER — Encounter: Payer: Self-pay | Admitting: Occupational Therapy

## 2015-10-04 ENCOUNTER — Ambulatory Visit: Payer: BC Managed Care – PPO | Admitting: Physical Therapy

## 2015-10-04 ENCOUNTER — Ambulatory Visit: Payer: BC Managed Care – PPO | Admitting: Occupational Therapy

## 2015-10-04 ENCOUNTER — Ambulatory Visit: Payer: BC Managed Care – PPO | Admitting: Speech Pathology

## 2015-10-04 DIAGNOSIS — R2689 Other abnormalities of gait and mobility: Secondary | ICD-10-CM

## 2015-10-04 DIAGNOSIS — R42 Dizziness and giddiness: Secondary | ICD-10-CM

## 2015-10-04 DIAGNOSIS — R278 Other lack of coordination: Secondary | ICD-10-CM

## 2015-10-04 DIAGNOSIS — R2681 Unsteadiness on feet: Secondary | ICD-10-CM

## 2015-10-04 DIAGNOSIS — R41841 Cognitive communication deficit: Secondary | ICD-10-CM

## 2015-10-04 NOTE — Patient Instructions (Signed)
Suggestions for tremors:   - supporting forearm on table with writing and/or eating/drinking - using light cuff weight (2-3 lbs) for writing and/or eating as needed. Do NOT wear except for specific tasks - weight bearing over arms prior to activity - using felt tip pen or weighted pen with writing when tremors are bad    Coordination Activities  Perform the following activities for 15-20 minutes 1-2 times per day with both hand(s).   Rotate ball in fingertips (clockwise and counter-clockwise).  Toss ball in air and catch with the same hand.  Flip cards 1 at a time as fast as you can.  Deal cards with your thumb (Hold deck in hand and push card off top with thumb).  Rotate card in hand (clockwise and counter-clockwise).  Pick up coins one at a time until you get 5-10 in your hand, then move coins from palm to fingertips to stack one at a time.  Practice writing and/or typing.

## 2015-10-04 NOTE — Therapy (Signed)
[facility] [address] [place], [place], [address] Phone: [phone]   Fax:  [phone]  Speech Language Pathology Treatment  Patient Details  Name: Darryl [name] MRN: [id number] Date of Birth: [birth year] Referring Provider: Dr. Faith Rogue  Encounter Date: 10/04/2015      End of Session - 10/04/15 1512    SLP Start Time 1238   SLP Stop Time  1317   SLP Time Calculation (min) 39 min      Past Medical History  Diagnosis Date  . Hypertension   . Seizures ([facility])   . Diabetes mellitus without complication ([facility])   . Acute deep vein thrombosis (DVT) of left femoral vein ([facility]) 07/01/2015    Past Surgical History  Procedure Laterality Date  . Knee surgery    . Cholecystectomy    . Radiology with anesthesia N/A 06/11/2015    Procedure: RADIOLOGY WITH ANESTHESIA;  Surgeon: Lisbeth Renshaw, MD;  Location[facility];  Service: Radiology;  Laterality: N/A;    There were no vitals filed for this visit.      Subjective Assessment - 10/04/15 1511    Subjective "You asked for my wife to come so I brought her" Pt arrived 8 min late for therapy               ADULT SLP TREATMENT - 10/04/15 1242    General Information   Behavior/Cognition Alert;Cooperative;Pleasant mood   Treatment Provided   Treatment provided Cognitive-Linquistic   Pain Assessment   Pain Assessment No/denies pain   Cognitive-Linquistic Treatment   Treatment focused on Cognition   Skilled Treatment Spouse present during session. Attention to detail in mildly complex  linguistic task with 80% accuracy and  usual min A to attend to details. Alternating attention and reasoning with deduction puzzle with occasional min verbal cues  - spouse trained on cueing pt and providing minimal distraction with simple homework to work on alternating and divided attention at home.    Assessment / Recommendations / Plan   Plan Continue with current plan of care   Progression Toward Goals   Progression toward goals Progressing toward goals          SLP Education - 10/04/15 1510    Education provided Yes   Education Details cueing pt for homework, providing conversation distractions on simple homework tasks   Person(s) Educated Patient;Spouse   Methods Explanation;Demonstration;Verbal cues   Comprehension Verbalized understanding;Returned demonstration          SLP Short Term Goals - 10/04/15 1511    SLP SHORT TERM GOAL #1   Title Pt will utlize external aids for recall of scedule, daily tasks and events over 3 sessions.    Time 3   Period Weeks   Status On-going   SLP SHORT TERM GOAL #2   Title Pt will attend to details on moderately complex cognitive linguistic tasks with rare min A   Time 3   Period Weeks   Status On-going   SLP SHORT TERM GOAL #3   Title Pt will sustain attention to cognitive linguistic tasks in distracting environment with rare min A   Time 3   Period Weeks   Status On-going          SLP Long Term Goals - 10/04/15 1511    SLP LONG TERM GOAL #1   Title Pt will divide attention between 2 moderately complex cognitive linguistic tasks with 85% on each and rare min A.   Time 7  Period Weeks   Status On-going   SLP LONG TERM GOAL #2   Title Pt will utilize compenstory strategies to recall new information (paragraphs, messages, etc) 85% accuracy and rare min A   Time 7   Period Weeks   Status On-going          Plan - 10/04/15 1511    Clinical Impression Statement Mr. Wende BushyHimes continues to required min A for attention to details on mildly complex cognitive llinguistic tasks and for memory. He has forgotten his homework twice - verbal instructions/cues to use external reminders.    Speech Therapy Frequency 2x / week   Treatment/Interventions Cognitive reorganization;SLP instruction and feedback;Internal/external aids;Compensatory strategies;Patient/family education;Functional tasks;Cueing hierarchy    Potential to Achieve Goals Good   Potential Considerations Ability to learn/carryover information   Consulted and Agree with Plan of Care Patient      Patient will benefit from skilled therapeutic intervention in order to improve the following deficits and impairments:   Cognitive communication deficit    Problem List Patient Active Problem List   Diagnosis Date Noted  . 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 Falls   . Acute deep vein thrombosis (DVT) of left femoral vein ([facility]) 07/01/2015  . Pain   . Poor fluid intake   . Essential hypertension   . Type 2 diabetes mellitus with complication, without long-term current use of insulin (H[facility])   . Disorientation   . Convulsions ([facility])   . Acute blood loss anemia   . Hypokalemia   . Ileus (H[facility])   . Leukocytosis   . Right knee pain   . Cognitive safety issue   . Impulsiveness   . PVC (premature ventricular contraction)   . Abdominal discomfort   . SAH (subarachnoid hemorrhage) ([facility]) 06/11/2015  . Emesis   . Subarachnoid hemorrhage ([facility]) 06/10/2015  . Hypertension   . Diabetes mellitus without complication ([facility])     Hadli Vandemark, Radene JourneyLaura Ann MS, CCC-SLP 10/04/2015, 3:12 PM  [facility] [address] [place], [place], [address] Phone: [phone]   Fax:  [phone]   Name: Claria Dicelonzo Gutknecht MRN: [id number] Date of Birth: Apr 08, 1951

## 2015-10-04 NOTE — Therapy (Signed)
[facility] [address] [place], [place], [address] Phone: [phone]   Fax:  [phone]  Occupational Therapy Treatment  Patient Details  Name: Darryl [name] MRN: [id number] Date of Birth: 10-11-1950 Referring Provider: Dr. Faith RogueZachary Swartz  Encounter Date: 10/04/2015      OT End of Session - 10/04/15 1026    Visit Number 2   Number of Visits 13   Date for OT Re-Evaluation 11/09/15   Authorization Type BCBS, no visit limit, no auth needed   OT Start Time 0940   OT Stop Time 1020   OT Time Calculation (min) 40 min   Activity Tolerance Patient tolerated treatment well      Past Medical History  Diagnosis Date  . Hypertension   . Seizures (HCC)   . Diabetes mellitus without complication (HCC)   . Acute deep vein thrombosis (DVT) of left femoral vein (HCC) 07/01/2015    Past Surgical History  Procedure Laterality Date  . Knee surgery    . Cholecystectomy    . Radiology with anesthesia N/A 06/11/2015    Procedure: RADIOLOGY WITH ANESTHESIA;  Surgeon: Lisbeth RenshawNeelesh Nundkumar, MD;  [facility];  Service: Radiology;  Laterality: N/A;    There were no vitals filed for this visit.      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 Yes   Pain Score 3    Pain Location Leg  lower leg   Pain Orientation Right   Pain Descriptors / Indicators Aching   Pain Type Acute pain   Pain Onset More than a month ago   Pain Frequency Intermittent   Aggravating Factors  over exercise   Pain Relieving Factors nothing                      OT Treatments/Exercises (OP) - 10/04/15 0001    ADLs   Writing Practiced writing with and without built up pen. Pt preferred regular pen. Pt also wrote with Pen Again but  did not like and had decreased legibility. Recommended slightly larger pen when writing for longer periods to prevent cramping. Also discussed ways to decrease tremors including: forearm supported, weighted or felt tip pen, lightweight cuff weight (2-3 lbs), and wt bearing prior to writing or carrying cup of coffee.    ADL Comments Discussed safety concerns with carrying coffee while having tremors and recommended wt bearing prior to carrying coffee and using travel mug to prevent spills and burning self   Fine Motor Coordination   Other Fine Motor Exercises coordination HEP issued - see pt instructions                OT Education - 10/04/15 1014    Education provided Yes   Education Details coordination HEP, suggestions for reducing tremors   Person(s) Educated Patient   Methods Explanation;Demonstration;Handout   Comprehension Verbalized understanding;Returned demonstration          OT Short Term Goals - 09/27/15 2030    OT SHORT TERM GOAL #1   Title Pt will verbalize understanding of adaptive/compensation strategies for ADLs (including compensation strategies for writing and tremors).--check STGs 10/27/15   Time 4   Period Weeks   Status New   OT SHORT TERM GOAL #2  Title Pt will perform a variety of tabletop scanning with >95% accuracy for IADLs.   Time 4   Period Weeks   Status New   OT SHORT TERM GOAL #3   Title Pt will improve R hand coordination for ADLs as shown by improving time on 9-hole peg test by at least 5sec.   Baseline 35.41sec   Time 4   Period Weeks   Status New           OT Long Term Goals - 09/27/15 2012    OT LONG TERM GOAL #1   Title Pt will be independent with HEP for coordination, R shoulder, and L hand strengthening.--check LTGs 11/09/15   Time 6   Period Weeks   Status New   OT LONG TERM GOAL #2   Title Pt will perform environmental scanning with at least 95% accuracy in busy environment for improved safety for ADLs.   Time 6   Period  Weeks   Status New   OT LONG TERM GOAL #3   Title Pt will write at least 5 sentences with good legibility and good letter/word spacing.   Baseline --   Time 6   Period Weeks   Status New   OT LONG TERM GOAL #4   Title Pt will demo at least 40lbs L grip strength for ADLs.   Baseline 33lbs   Time 6   Period Weeks   Status New               Plan - 10/04/15 1027    Clinical Impression Statement Pt very concerned about tremors and unpredictability of onset. Discussed ways to reduce tremors but also encouraged pt to discuss with MD medical reasons.    Rehab Potential Good   Clinical Impairments Affecting Rehab Potential Memory deficits   OT Frequency 2x / week   OT Treatment/Interventions Self-care/ADL training;DME and/or AE instruction;Energy conservation;Building services engineer;Therapeutic exercises;Patient/family education;Visual/perceptual remediation/compensation;Cognitive remediation/compensation;Therapeutic exercise;Moist Heat;Cryotherapy;Manual Therapy;Passive range of motion;Therapeutic activities;Balance training;Neuromuscular education   Plan review coordination HEP PRN, issue HEP for Rt shoulder strengthening, tabletop scanning and visual HEP PRN   OT Home Exercise Plan coordination HEP and suggestions for reducing tremors issued 10/04/15   Consulted and Agree with Plan of Care Patient      Patient will benefit from skilled therapeutic intervention in order to improve the following deficits and impairments:  Decreased coordination, Impaired vision/preception, Decreased balance, Decreased cognition, Decreased knowledge of precautions, Decreased safety awareness, Impaired UE functional use, Decreased strength, Decreased knowledge of use of DME  Visit Diagnosis: Other lack of coordination    Problem List Patient Active Problem List   Diagnosis Date Noted  . 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 Falls   .  Acute deep vein thrombosis (DVT) of left femoral vein (HCC) 07/01/2015  . Pain   . Poor fluid intake   . Essential hypertension   . Type 2 diabetes mellitus with complication, without long-term current use of insulin (HCC)   . Disorientation   . Convulsions (HCC)   . Acute blood loss anemia   . Hypokalemia   . Ileus (HCC)   . Leukocytosis   . Right knee pain   . Cognitive safety issue   . Impulsiveness   . PVC (premature ventricular contraction)   . Abdominal discomfort   . SAH (subarachnoid hemorrhage) (HCC) 06/11/2015  . Emesis   . Subarachnoid hemorrhage (HCC) 06/10/2015  . Hypertension   . Diabetes mellitus without complication (  HCC)     Kelli Churn, OTR/L 10/04/2015, 10:30 AM  [facility] [address] [place], [place], [address] Phone: [phone]   Fax:  [phone]  Name: Junior Huezo MRN: [id number] Date of Birth: [birth year]

## 2015-10-04 NOTE — Therapy (Signed)
[facility] [address] [place], [place], [address] Phone: [phone]   Fax:  [phone]  Physical Therapy Treatment  Patient Details  Name: Darryl [name] MRN: [id number] Date of Birth: May 21, 1951 Referring Provider: Alger Simons, MD  Encounter Date: 10/04/2015      PT End of Session - 10/04/15 2038    Visit Number 7   Number of Visits 17   Date for PT Re-Evaluation 12/03/15   Authorization Type BCBS; no visit limit, no auth required   PT Start Time 1317   PT Stop Time 1402   PT Time Calculation (min) 45 min   Equipment Utilized During Treatment Gait belt   Activity Tolerance Other (comment)  patient limited by dizziness and headache   Behavior During Therapy [facility] for tasks assessed/performed      Past Medical History  Diagnosis Date  . Hypertension   . Seizures ([place])   . Diabetes mellitus without complication ([place])   . Acute deep vein thrombosis (DVT) of left femoral vein ([place]) 07/01/2015    Past Surgical History  Procedure Laterality Date  . Knee surgery    . Cholecystectomy    . Radiology with anesthesia N/A 06/11/2015    Procedure: RADIOLOGY WITH ANESTHESIA;  Surgeon: [name], MD;  Location: [place];  Service: Radiology;  Laterality: N/A;    There were no vitals filed for this visit.      Subjective Assessment - 10/04/15 1319    Subjective Pt denies falls and reports no significant changes. States, "I get headaches when I think too hard and after I get really dizzy. 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    Patient Stated Goals "I want to get stronger in my right leg; to get rid of the cane; and to work on balance."   Currently in Pain? No/denies  "Mostly just soreness from exercise; not even worth rating"             [facility] PT Assessment - 10/04/15 0001    Functional Gait  Assessment   Gait assessed  Yes   Gait Level Surface Walks 20 ft, slow speed, abnormal gait pattern, evidence for imbalance or deviates 10-15 in outside of the 12 in walkway width. Requires more than 7 sec to ambulate 20 ft.  7.41 sec   Change in Gait Speed Makes only minor adjustments to walking speed, or accomplishes a change in speed with significant gait deviations, deviates 10-15 in outside the 12 in walkway width, or changes speed but loses balance but is able to recover and continue walking.  LOB with effective self-recovery   Gait with Horizontal Head Turns Performs head turns with moderate changes in gait velocity, slows down, deviates 10-15 in outside 12 in walkway width but recovers, can continue to walk.   Gait with Vertical Head Turns Performs task with slight change in gait velocity (eg, minor disruption to smooth gait path), deviates 6 - 10 in outside 12 in walkway width or uses assistive device   Gait and Pivot Turn Turns slowly, requires verbal cueing, or requires several small steps to catch balance following turn and stop  requires (S)   Step Over Obstacle Is able to step over one shoe box (4.5 in total height) without changing gait speed. No evidence of imbalance.  Gait with Narrow Base of Support Ambulates less than 4 steps heel to toe or cannot perform without assistance.   Gait with Eyes Closed Walks 20 ft, slow speed, abnormal gait pattern, evidence for imbalance, deviates 10-15 in outside 12 in walkway width. Requires more than 9 sec to ambulate 20 ft.  17.51 sec; onset of headache afterward   Ambulating Backwards Cannot walk 20 ft without assistance, severe gait deviations or imbalance, deviates greater than 15 in outside 12 in walkway width or will not attempt task.  "that made me a little dizzy"; posterior LOB upon stopping   Steps Alternating feet, must use rail.   Total Score 11   FGA comment: <  19 = high fall risk                          Balance Exercises - 10/04/15 2055    Balance Exercises: Standing   Gait with Head Turns Forward;Intermittent upper extremity support;5 reps;Other (comment)  horiz, vert, and B diagonal head turns 5 x10' each   Tandem Gait Forward;5 reps;Other reps (comment);Intermittent upper extremity support  5 x10' with finger tip support           PT Education - 10/04/15 2035    Education provided Yes   Education Details FGA findings and implicated fall risk.  HEP: modified to gait wth head turns, tandem gait at countertop. Strategies for mitigating dizziness, avoiding headache.   Person(s) Educated Patient   Methods Explanation;Demonstration;Verbal cues;Handout   Comprehension Verbalized understanding;Returned demonstration          PT Short Term Goals - 10/04/15 2040    PT SHORT TERM GOAL #1   Title Pt will perform initial home exercise program with mod I using paper handout to indicate safe HEP compliance. (Target date: 10/04/15)   Baseline Pt reports compliance with walking program and demonstrates x1 viewing effectively.   Status Achieved   PT SHORT TERM GOAL #2   Title Pt will increase self-selected gait speed from 1.66 ft/sec > 1.8 ft/sec to indicate decreased risk of recurrent falls.    Baseline 5/2: gait velocity = 3.18 ft/sec   Status Achieved   PT SHORT TERM GOAL #3   Title Pt will increase Berg from 31/56 to 35/56 to indicate improved standing balance.    Baseline 5/2: Berg score = 54/56.   Status Achieved   PT SHORT TERM GOAL #4   Title Pt will ambulate 200' over level, indoor surfaces with mod I using LRAD to indicate increased safety with household mobility.     Status Achieved   PT SHORT TERM GOAL #5   Title Pt will negotiate 14 stairs with 2 rails with mod I to enable progress toward safety/independence accessing second floor of home.    Baseline Met 5/2.   Status Achieved   PT SHORT TERM GOAL #6   Title  Pt will negotiate 3 stairs without rails with mod I using LRAD to enable pt to safely access second floor of home.    Baseline Met 5/4.   Status Achieved   PT SHORT TERM GOAL #7   Title Pt will negotiate 6 stairs with 1 rail with mod I to enable pt to safely use primary home entrance.   Of 14 stairs to access second floor, 3 stairs have no rails.    Baseline Met 5/2.   Status Achieved   PT SHORT TERM GOAL #8   Title Pt will perform floor  transfer with min A using standard chair or mat table to progress toward safety with fall recovery.   Baseline Met 5/2.   Status Achieved           PT Long Term Goals - 10/04/15 2041    PT LONG TERM GOAL #1   Title Pt will increase self-selected gait speed from 1.66 ft/sec to > / = 2.26 ft/sec to demonstrate increased efficiency of ambulation.   (Target date: 11/01/15)   Baseline 5/2: gait velocity = 3.18 ft/sec   Status Achieved   PT LONG TERM GOAL #2   Title Pt will improve Berg score from 31/56 to 39/56 to indicate significant improvement in functional standing balance.    Baseline 5/2: [name] = 54/56   Status Achieved   PT LONG TERM GOAL #3   Title Pt will independently ambulate 1,000' over unlevel, paved surfaces with no rest breaks to indicate improved endurance and increased safety with community mobility.   Baseline 5/2: REVISED from mod I to independent without AD due to pt progress.   Status Revised   PT LONG TERM GOAL #4   Title Pt will independently negotiate standard ramp and curb step to indicate increased safety traversing community obstacles.    Baseline 5/2: REVISED from mod I to independent without AD due to pt progress.   Status Revised   PT LONG TERM GOAL #5   Title Pt will perform floor transfer with mod I using standard chair or mat table to indicate increased safety with fall recovery.   Baseline Met 5/2.   Status Achieved   Additional Long Term Goals   Additional Long Term Goals Yes   PT LONG TERM GOAL #6   Title Pt will  perform all above aspects of functional mobility with no more than 2-point increase in pain rating to indicate decreased impact of pain on functional activity tolerance.   Status On-going   PT LONG TERM GOAL #7   Title Pt will improve FGA score from 11/20 to >/= 19/20 to indicate decreased fall risk.   (Target date: 11/01/15)   Status New   PT LONG TERM GOAL #8   Title Pt will demonstrate use of strategy to mitigate dizziness without cueing from PT to demonstrate pt independence managing symptoms.  (Target date: 11/01/15)   Status New               Plan - 10/04/15 2044    Clinical Impression Statement Pt met majority of short and long term goals at last session. Current session focused on assessment of dynamic gait stability. providing HEP to address identified impairments, and education on pt management of dizziness and headaches. Pt scored 11/30 on FGA, suggesting high fall risk. Pt required prolonged seated rest break (10 minutes) to recover from dizziness (4/10) and headache (6/10) after FGA. During rest break, PT educated pt on origin of dizziness (suspect impaired visual-vestibular integration); explained habituation but emphasized importance of keeping symptoms at < 5/10. Pt will likely require reinforcement. Added LTG's for FGA and for self-management of symptoms. Pt will continue to benefit from skilled outpatient PT  2x/week for 4 weeks remaining in this POC.    Rehab Potential Good   Clinical Impairments Affecting Rehab Potential positive: age, supportive wife; negative: decreased awareness, chronic R knee pain   PT Frequency 2x / week   PT Duration 8 weeks   PT Treatment/Interventions ADLs/Self Care Home Management;Vestibular;DME Instruction;Gait training;Stair training;Functional mobility training;Therapeutic activities;Therapeutic exercise;Balance training;Orthotic Fit/Training;Patient/family education;Neuromuscular re-education;Visual/perceptual remediation/compensation  PT Next  Visit Plan Assess HEP performance and progress prn.Consider adding standing head turns and static standning with EC for habituation.  Standing balance, gait training, dynamic gait, hip strengthening.   PT Home Exercise Plan See Pt Instructions from 5/4.   Consulted and Agree with Plan of Care Patient      Patient will benefit from skilled therapeutic intervention in order to improve the following deficits and impairments:  Postural dysfunction, Abnormal gait, Decreased activity tolerance, Decreased balance, Decreased endurance, Decreased coordination, Dizziness, Decreased knowledge of use of DME, Pain, Decreased strength, Decreased safety awareness, Impaired perceived functional ability, Impaired vision/preception  Visit Diagnosis: Other abnormalities of gait and mobility - Plan: PT plan of care cert/re-cert  Unsteadiness on feet - Plan: PT plan of care cert/re-cert  Dizziness and giddiness - Plan: PT plan of care cert/re-cert  Other lack of coordination - Plan: PT plan of care cert/re-cert     Problem List Patient Active Problem List   Diagnosis Date Noted  . 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 Falls   . Acute deep vein thrombosis (DVT) of left femoral vein ([place]) 07/01/2015  . Pain   . Poor fluid intake   . Essential hypertension   . Type 2 diabetes mellitus with complication, without long-term current use of insulin ([place])   . Disorientation   . Convulsions ([place])   . Acute blood loss anemia   . Hypokalemia   . Ileus ([place])   . Leukocytosis   . Right knee pain   . Cognitive safety issue   . Impulsiveness   . PVC (premature ventricular contraction)   . Abdominal discomfort   . SAH (subarachnoid hemorrhage) ([place]) 06/11/2015  . Emesis   . Subarachnoid hemorrhage ([place]) 06/10/2015  . Hypertension   . Diabetes mellitus without complication ([place])    Billie Ruddy, PT, [facility] [address] [place], [place], [address] Phone: [phone]   Fax:  [phone] 10/04/2015, 8:58 PM  Name: Elijiah Mickley MRN: [id number] Date of Birth: [birth year]

## 2015-10-04 NOTE — Patient Instructions (Signed)
If symptoms increase above 4/10, stop and rest until symptoms settle. If you're dizzy, try staring at a stationary object ("A") in seated for 1-2 minutes. If you get a headache, try sitting, resting in a quiet place until headache subsides.  Walking Head Turn    Stand with 1 hand on a countertop. Walk the length of your countertop (down and back 5 times each) while turning head: - right to left   - up to down - diagonally from up/left to down/right - diagonally form up/right to down left  Tandem Walking    While still standing with one hand at countertop, walk with each foot directly in front of other, heel of one foot touching toes of other foot with each step. Both feet straight ahead.  Walk the length of your countertop 5 times (down and back) in this manner.

## 2015-10-05 NOTE — Telephone Encounter (Signed)
[name] called back and I received clarification as to what medications he needed refilled.  He stated that he needed simvastatin and megestrol refilled. I explained that these meds will need to be filled by his PCP.  He does not have another appointment with the PCP until June.  He asked if we could handle these refills for this immediate need?   I explained that I would send a message to Dr. Riley KillSwartz and get back to him.  I explained to the patient that ultimately he needs to get his heart medications from a cardiologist and that other daily maintenance medications need to come from his family  Doctor.Marland Kitchen.Marland Kitchen.Marland Kitchen.Marland Kitchen.is it okay for us to fill these two medications to cover patient until his appointment with his primary care?

## 2015-10-08 MED ORDER — MEGESTROL ACETATE 400 MG/10ML PO SUSP: 400.0000 mg | Freq: Two times a day (BID) | ORAL | Status: DC

## 2015-10-08 MED ORDER — SIMVASTATIN 20 MG PO TABS: 20.0000 mg | ORAL_TABLET | Freq: Every day | ORAL | Status: DC

## 2015-10-08 NOTE — Telephone Encounter (Signed)
i refilled these meds. The question i have is, "does he still need the megace?" I had hoped he would be off of it at this point.

## 2015-10-09 ENCOUNTER — Ambulatory Visit: Payer: BC Managed Care – PPO | Admitting: Occupational Therapy

## 2015-10-09 ENCOUNTER — Ambulatory Visit: Payer: BC Managed Care – PPO | Admitting: Physical Therapy

## 2015-10-09 ENCOUNTER — Ambulatory Visit: Payer: BC Managed Care – PPO | Admitting: Speech Pathology

## 2015-10-09 DIAGNOSIS — M6281 Muscle weakness (generalized): Secondary | ICD-10-CM

## 2015-10-09 DIAGNOSIS — R2689 Other abnormalities of gait and mobility: Secondary | ICD-10-CM

## 2015-10-09 DIAGNOSIS — R2681 Unsteadiness on feet: Secondary | ICD-10-CM

## 2015-10-09 DIAGNOSIS — R41842 Visuospatial deficit: Secondary | ICD-10-CM

## 2015-10-09 DIAGNOSIS — R42 Dizziness and giddiness: Secondary | ICD-10-CM

## 2015-10-09 DIAGNOSIS — R41841 Cognitive communication deficit: Secondary | ICD-10-CM

## 2015-10-09 DIAGNOSIS — R278 Other lack of coordination: Secondary | ICD-10-CM

## 2015-10-09 NOTE — Therapy (Signed)
[facility] [address] [place], [place], [address] Phone: [phone]   Fax:  [phone]  Physical Therapy Treatment  Patient Details  Name: Darryl [name] MRN: [id number] Date of Birth: 11/06/1950 Referring Provider: Alger Simons, MD  Encounter Date: 10/09/2015      PT End of Session - 10/09/15 1502    Visit Number 8   Number of Visits 17   Date for PT Re-Evaluation 12/03/15   Authorization Type [employer]; no visit limit, no auth required   PT Start Time 1405   PT Stop Time 1445   PT Time Calculation (min) 40 min   Activity Tolerance Patient tolerated treatment well   Behavior During Therapy [facility] for tasks assessed/performed      Past Medical History  Diagnosis Date  . Hypertension   . Seizures (Darryl [name])   . Diabetes mellitus without complication (Darryl [name])   . Acute deep vein thrombosis (DVT) of left femoral vein (Darryl [name]) 07/01/2015    Past Surgical History  Procedure Laterality Date  . Knee surgery    . Cholecystectomy    . Radiology with anesthesia N/A 06/11/2015    Procedure: RADIOLOGY WITH ANESTHESIA;  Surgeon: Darryl Lose, MD;  Location: [facility];  Service: Radiology;  Laterality: N/A;    There were no vitals filed for this visit.      Subjective Assessment - 10/09/15 1406    Subjective Pt denies falls and reports no significant changes. 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    Patient Stated Goals "I want to get stronger in my right leg; to get rid of the cane; and to work on balance."   Currently in Pain? No/denies            [facility] PT Assessment - 10/09/15 0001    Strength   Overall Strength Deficits   Overall Strength Comments L hip flexion 4/5; noted extensor lag with supine SLR, suggesting decreaed motor control in R  VMO. R hip ABD 3-/5, R hip ext 3-/5.                     [facility] Adult PT Treatment/Exercise - 10/09/15 0001    Ambulation/Gait   Ambulation/Gait Yes   Ambulation/Gait Assistance 5: Supervision   Ambulation/Gait Assistance Details Gait 3 x90' while searching for playing cards to facilitate multidirectional head turns. Cueing provided for increased visual attention, especially to cards at eye-level on L side. Noted single LOB due to toe catch with effective self-recovery. Pt perceived R knee instability during gait.   Ambulation Distance (Feet) 300 Feet   Assistive device None   Gait Pattern Step-through pattern;Decreased stride length;Shuffle;Poor foot clearance - left  R toe catch x1   Ambulation Surface Level;Indoor   Exercises   Exercises Other Exercises   Other Exercises  With verbal/demo cueing from PT, pt effectively performed the following exercises (all of which were added to HEP): supine SLR 2 x10 reps with cueing to prevent quadriceps lag; R clamshells x20 reps with cueing for alignment/technique; R singleleg bridge x8 reps (to pt fatigue) with cueing for technique.             Balance Exercises - 10/09/15 1501    Balance Exercises: Standing   Gait with Head Turns Forward;Intermittent upper extremity support;5 reps;Other (comment)  horiz, vert, and  B diagonal head turns 5 x10' each   Tandem Gait Forward;5 reps;Other reps (comment);Intermittent upper extremity support  5 x10' with finger tip support           PT Education - 10/09/15 1455    Education provided Yes   Education Details HEP: added R hip/knee strengthening exercises. See Pt Instructions.   Person(s) Educated Patient   Methods Explanation;Demonstration;Handout;Verbal cues;Tactile cues   Comprehension Verbalized understanding;Returned demonstration;Need further instruction  May need reinforcement.          PT Short Term Goals - 10/04/15 2040    PT SHORT TERM GOAL #1   Title Pt will  perform initial home exercise program with mod I using paper handout to indicate safe HEP compliance. (Target date: 10/04/15)   Baseline Pt reports compliance with walking program and demonstrates x1 viewing effectively.   Status Achieved   PT SHORT TERM GOAL #2   Title Pt will increase self-selected gait speed from 1.66 ft/sec > 1.8 ft/sec to indicate decreased risk of recurrent falls.    Baseline 5/2: gait velocity = 3.18 ft/sec   Status Achieved   PT SHORT TERM GOAL #3   Title Pt will increase Berg from 31/56 to 35/56 to indicate improved standing balance.    Baseline 5/2: Berg score = 54/56.   Status Achieved   PT SHORT TERM GOAL #4   Title Pt will ambulate 200' over level, indoor surfaces with mod I using LRAD to indicate increased safety with household mobility.     Status Achieved   PT SHORT TERM GOAL #5   Title Pt will negotiate 14 stairs with 2 rails with mod I to enable progress toward safety/independence accessing second floor of home.    Baseline Met 5/2.   Status Achieved   PT SHORT TERM GOAL #6   Title Pt will negotiate 3 stairs without rails with mod I using LRAD to enable pt to safely access second floor of home.    Baseline Met 5/4.   Status Achieved   PT SHORT TERM GOAL #7   Title Pt will negotiate 6 stairs with 1 rail with mod I to enable pt to safely use primary home entrance.   Of 14 stairs to access second floor, 3 stairs have no rails.    Baseline Met 5/2.   Status Achieved   PT SHORT TERM GOAL #8   Title Pt will perform floor transfer with min A using standard chair or mat table to progress toward safety with fall recovery.   Baseline Met 5/2.   Status Achieved           PT Long Term Goals - 10/04/15 2041    PT LONG TERM GOAL #1   Title Pt will increase self-selected gait speed from 1.66 ft/sec to > / = 2.26 ft/sec to demonstrate increased efficiency of ambulation.   (Target date: 11/01/15)   Baseline 5/2: gait velocity = 3.18 ft/sec   Status Achieved    PT LONG TERM GOAL #2   Title Pt will improve Berg score from 31/56 to 39/56 to indicate significant improvement in functional standing balance.    Baseline 5/2: Darryl [name] = 54/56   Status Achieved   PT LONG TERM GOAL #3   Title Pt will independently ambulate 1,000' over unlevel, paved surfaces with no rest breaks to indicate improved endurance and increased safety with community mobility.   Baseline 5/2: REVISED from mod I to independent without AD due to pt progress.   Status  Revised   PT LONG TERM GOAL #4   Title Pt will independently negotiate standard ramp and curb step to indicate increased safety traversing community obstacles.    Baseline 5/2: REVISED from mod I to independent without AD due to pt progress.   Status Revised   PT LONG TERM GOAL #5   Title Pt will perform floor transfer with mod I using standard chair or mat table to indicate increased safety with fall recovery.   Baseline Met 5/2.   Status Achieved   Additional Long Term Goals   Additional Long Term Goals Yes   PT LONG TERM GOAL #6   Title Pt will perform all above aspects of functional mobility with no more than 2-point increase in pain rating to indicate decreased impact of pain on functional activity tolerance.   Status On-going   PT LONG TERM GOAL #7   Title Pt will improve FGA score from 11/20 to >/= 19/20 to indicate decreased fall risk.   (Target date: 11/01/15)   Status New   PT LONG TERM GOAL #8   Title Pt will demonstrate use of strategy to mitigate dizziness without cueing from PT to demonstrate pt independence managing symptoms.  (Target date: 11/01/15)   Status New               Plan - 10/09/15 1505    Clinical Impression Statement Pt continues to perceive R knee instability during standing/gait. Reassessment of RLE strength reveals weakness in R hip extension, ABD, and decreased motor control in R VMO. Added home exercises to address said impairments.    Rehab Potential Good   Clinical Impairments  Affecting Rehab Potential positive: age, supportive wife; negative: decreased awareness, chronic R knee pain   PT Frequency 2x / week   PT Duration 8 weeks   PT Treatment/Interventions ADLs/Self Care Home Management;Vestibular;DME Instruction;Gait training;Stair training;Functional mobility training;Therapeutic activities;Therapeutic exercise;Balance training;Orthotic Fit/Training;Patient/family education;Neuromuscular re-education;Visual/perceptual remediation/compensation   PT Next Visit Plan Ask about RLE strengthening HEP. Continue hip strengthening (emphasis on R), dynamic gait, high level balance.   Consulted and Agree with Plan of Care Patient      Patient will benefit from skilled therapeutic intervention in order to improve the following deficits and impairments:  Postural dysfunction, Abnormal gait, Decreased activity tolerance, Decreased balance, Decreased endurance, Decreased coordination, Dizziness, Decreased knowledge of use of DME, Pain, Decreased strength, Decreased safety awareness, Impaired perceived functional ability, Impaired vision/preception  Visit Diagnosis: Unsteadiness on feet  Other abnormalities of gait and mobility  Dizziness and giddiness  Muscle weakness (generalized)     Problem List Patient Active Problem List   Diagnosis Date Noted  . 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 Falls   . Acute deep vein thrombosis (DVT) of left femoral vein ([facility]) 07/01/2015  . Pain   . Poor fluid intake   . Essential hypertension   . Type 2 diabetes mellitus with complication, without long-term current use of insulin ([facility])   . Disorientation   . Convulsions ([facility])   . Acute blood loss anemia   . Hypokalemia   . Ileus ([facility])   . Leukocytosis   . Right knee pain   . Cognitive safety issue   . Impulsiveness   . PVC (premature ventricular contraction)   . Abdominal discomfort   . SAH (subarachnoid hemorrhage)  ([place]) 06/11/2015  . Emesis   . Subarachnoid hemorrhage ([place]) 06/10/2015  . Hypertension   . Diabetes mellitus without complication ([place])  Billie Ruddy, PT, DPT [facility] [address] [place], [place], [address] Phone: [phone]   Fax:  [phone] 10/09/2015, 3:08 PM  Name: Darryl [name] MRN: [id number] Date of Birth: 29-Aug-1950

## 2015-10-09 NOTE — Therapy (Signed)
[facility] [address] [place], [place], [address] Phone: [phone]   Fax:  [phone]  Speech Language Pathology Treatment  Patient Details  Name: Darryl [name] MRN: [id number] Date of Birth: [birth year] Referring Provider: Dr. Faith RogueZachary Swartz  Encounter Date: 10/09/2015      End of Session - 10/09/15 1402    Visit Number 6   Number of Visits 17   Date for SLP Re-Evaluation 11/01/15   SLP Start Time 1314   SLP Stop Time  1402   SLP Time Calculation (min) 48 min      Past Medical History  Diagnosis Date  . Hypertension   . Seizures ([facility])   . Diabetes mellitus without complication ([facility])   . Acute deep vein thrombosis (DVT) of left femoral vein ([facility]) 07/01/2015    Past Surgical History  Procedure Laterality Date  . Knee surgery    . Cholecystectomy    . Radiology with anesthesia N/A 06/11/2015    Procedure: RADIOLOGY WITH ANESTHESIA;  Surgeon: Lisbeth RenshawNeelesh Nundkumar, MD;  [facility];  Service: Radiology;  Laterality: N/A;    There were no vitals filed for this visit.      Subjective Assessment - 10/09/15 1315    Subjective "I didn't go to church - I changed my mind. I have to get the psychological part in order"   Currently in Pain? No/denies               ADULT SLP TREATMENT - 10/09/15 1316    General Information   Behavior/Cognition Alert;Cooperative;Pleasant mood   Treatment Provided   Treatment provided Cognitive-Linquistic   Pain Assessment   Pain Assessment No/denies pain   Cognitive-Linquistic Treatment   Treatment focused on Cognition   Skilled Treatment Pt. completed homework with some reported difficulty on moderately complex deduction puzzles.  Alternating attention, attention to detail and verbal problem solving targeted by reading information/price chart, organizing math for word problems and answering questions. Pt required usual min to mod A for attention to details and error ID.  Pt missed 4/7 details without cues, then 3/7 details on next problem. . Pt utilized phone to answer questions re: his schedule and appoinments with supervision A.    Assessment / Recommendations / Plan   Plan Continue with current plan of care   Progression Toward Goals   Progression toward goals Progressing toward goals          SLP Education - 10/09/15 1355    Education provided Yes   Education Details Compensations for attention   Person(s) Educated Patient   Methods Explanation;Demonstration   Comprehension Verbalized understanding;Need further instruction          SLP Short Term Goals - 10/09/15 1359    SLP SHORT TERM GOAL #1   Title Pt will utlize external aids for recall of scedule, daily tasks and events over 3 sessions.    Baseline 10/09/15,   Time 2   Period Weeks   Status On-going   SLP SHORT TERM GOAL #2   Title Pt will attend to details on moderately complex cognitive linguistic tasks with rare min A   Time 2   Period Weeks   Status On-going   SLP SHORT TERM GOAL #3   Title Pt will sustain attention to cognitive linguistic tasks in distracting environment with rare min A   Time 2   Period Weeks   Status Achieved          SLP Long Term Goals -  10/09/15 1402    SLP LONG TERM GOAL #1   Title Pt will divide attention between 2 moderately complex cognitive linguistic tasks with 85% on each and rare min A.   Time 6   Period Weeks   Status On-going   SLP LONG TERM GOAL #2   Title Pt will utilize compenstory strategies to recall new information (paragraphs, messages, etc) 85% accuracy and rare min A   Time 6   Period Weeks   Status On-going          Plan - 10/09/15 1356    Clinical Impression Statement SLP A continued for attention to details on moderately complex cogntive linguistic tasks and for error awareness. Continue skilled ST to maximize cognition for possible return to work and improved independence.     Speech Therapy Frequency 2x / week    Treatment/Interventions Cognitive reorganization;SLP instruction and feedback;Internal/external aids;Compensatory strategies;Patient/family education;Functional tasks;Cueing hierarchy   Potential to Achieve Goals Good   Potential Considerations Ability to learn/carryover information   Consulted and Agree with Plan of Care Patient      Patient will benefit from skilled therapeutic intervention in order to improve the following deficits and impairments:   Cognitive communication deficit    Problem List Patient Active Problem List   Diagnosis Date Noted  . 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 Falls   . Acute deep vein thrombosis (DVT) of left femoral vein ([facility]) 07/01/2015  . Pain   . Poor fluid intake   . Essential hypertension   . Type 2 diabetes mellitus with complication, without long-term current use of insulin ([facility])   . Disorientation   . Convulsions ([facility])   . Acute blood loss anemia   . Hypokalemia   . Ileus ([facility])   . Leukocytosis   . Right knee pain   . Cognitive safety issue   . Impulsiveness   . PVC (premature ventricular contraction)   . Abdominal discomfort   . SAH (subarachnoid hemorrhage) ([facility]) 06/11/2015  . Emesis   . Subarachnoid hemorrhage ([facility]) 06/10/2015  . Hypertension   . Diabetes mellitus without complication ([facility])     Warren Lindahl, Radene Journey MS, CCC-SLP 10/09/2015, 2:03 PM  [facility] [address] [place], [place], [address] Phone: [phone]   Fax:  [phone]   Name: Darryl [name] MRN: [id number] Date of Birth: 1950-09-18

## 2015-10-09 NOTE — Patient Instructions (Addendum)
If symptoms increase above 4/10, stop and rest until symptoms settle. If you're dizzy, try staring at a stationary object ("A") in seated for 1-2 minutes. If you get a headache, try sitting, resting in a quiet place until headache subsides.  Walking Head Turn    Stand with 1 hand on a countertop. Walk the length of your countertop (down and back 5 times each) while turning head: - diagonally from up/left to down/right - diagonally form up/right to down left  Tandem Walking    While still standing with one hand at countertop, walk with each foot directly in front of other, heel of one foot touching toes of other foot with each step. Both feet straight ahead.  Walk the length of your countertop 5 times (down and back) in this manner.   Bridging (Single Leg) - RIGHT   Lie on back with feet shoulder width apart and LEFT leg straight. Use right leg to lift hips toward the ceiling while keeping leg straight. Hold __1-2__ seconds, then slowly lower to starting position. Repeat __8__ times. Do __2-3__ sessions per days. Increase by 1-2 reps at a time, as tolerated.   Abduction: Clam (Eccentric) - Side-Lying - RIGHT    Lie on side with knees bent. Lift top knee, keeping feet together. Keep trunk stead - hold onto edge of bed with top hand to prevent hip movement. Slowly lower for 3-5 seconds. _20__ reps per set, 2-3 times per day on the RIGHT leg.  Hip Flexion / Knee Extension: Straight-Leg Raise (Eccentric) - RIGHT    Lie on back. Lift right leg with knee straight. Slowly lower leg for 3-5 seconds. Make sure the back of your knee and the back of your heel touch the bed at the same time.  __10_ reps per set, 2-3 times per day.

## 2015-10-09 NOTE — Patient Instructions (Signed)
Strengthening: Resisted Flexion   Attach tube to door.  Hold tubing with one arm at side. Pull forward and up. Move shoulder through pain-free range of motion. Repeat 15 times per set.  Do 1 sessions per day. Right arm    Strengthening: Resisted Extension   Attach one end to door.  Hold tubing in one hand, arm forward. Pull arm back, elbow straight. Repeat 15 times per set. Do 1 sessions per day.  Right arm   Resisted Horizontal Abduction: Bilateral   Sit or stand, tubing in both hands, arms out in front. Keeping arms straight, pinch shoulder blades together and stretch arms out. Repeat 15 times per set.  Do 1 sessions per day.    Scapular Retraction (Prone)   Lie with arms at sides. Pinch shoulder blades together and raise arms a few inches from floor. Repeat 10 times per set. Do 1 sessions per day.    Prone Plank (Eccentric)   SHOULDER: Scapular Protraction - Prone    Use shoulder muscles to lift chest off floor. Do not lift hips. Hold 5sec.  5-10 reps per set, 1 times per day     Angry Cat, All Fours    Stand at table with hands on table and feet on floor. Tuck chin and tighten stomach, round back upward. Then arch back downward and squeeze shoulder blades together. Hold each position 5-10 seconds. Repeat 5-10 times per session. Do 1 sessions per day.

## 2015-10-09 NOTE — Therapy (Signed)
[facility] [address] [place], [place], [address] Phone: [phone]   Fax:  [phone]  Occupational Therapy Treatment  Patient Details  Name: Darryl [name] MRN: [id number] Date of Birth: Aug 22, 1950 Referring Provider: Dr. Faith Rogue  Encounter Date: 10/09/2015      OT End of Session - 10/09/15 0837    Visit Number 3   Number of Visits 13   Date for OT Re-Evaluation 11/09/15   Authorization Type [employer], no visit limit, no auth needed   OT Start Time 0805   OT Stop Time 0845   OT Time Calculation (min) 40 min   Activity Tolerance Patient tolerated treatment well   Behavior During Therapy [facility] for tasks assessed/performed      Past Medical History  Diagnosis Date  . Hypertension   . Seizures (HCC)   . Diabetes mellitus without complication (HCC)   . Acute deep vein thrombosis (DVT) of left femoral vein (HCC) 07/01/2015    Past Surgical History  Procedure Laterality Date  . Knee surgery    . Cholecystectomy    . Radiology with anesthesia N/A 06/11/2015    Procedure: RADIOLOGY WITH ANESTHESIA;  Surgeon: Lisbeth Renshaw, MD;  Location: [facility] OR;  Service: Radiology;  Laterality: N/A;    There were no vitals filed for this visit.      Subjective Assessment - 10/09/15 0806    Subjective  Pt reports that tremors come and go.  Pt reports that he plans to journal when he has them.  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 No/denies                      OT Treatments/Exercises (OP) - 10/09/15 0001    ADLs   Writing Trial of writing with weighted pen, but no difference noted today due to no tremors.  Will plan to trial weighted pen/felt tip pen again if pt is experiencing  tremors.   Fine Motor Coordination   Fine Motor Coordination Dealing card with thumb   Dealing card with thumb with R hand with min difficulty/incr time   Visual/Perceptual Exercises   Copy this Image Pegboard   Pegboard Pt able to copy small peg design with 100% accuracy and incr time.  Placing small pegs in pegboard with R hand for incr coordination with min difficulty.  Then removing pegs with in-hand manipulation/translation in hand with incr time/min difficulty.                OT Education - 10/09/15 1324    Education Details HEP (red theraband, wt. bearing, scapular stability)--see pt instructions   Person(s) Educated Patient   Methods Explanation;Demonstration;Verbal cues;Handout;Tactile cues   Comprehension Returned demonstration;Verbalized understanding;Verbal cues required          OT Short Term Goals - 09/27/15 2030    OT SHORT TERM GOAL #1   Title Pt will verbalize understanding of adaptive/compensation strategies for ADLs (including compensation strategies for writing and tremors).--check STGs 10/27/15   Time 4   Period Weeks   Status New   OT SHORT TERM GOAL #2   Title Pt will perform a variety of tabletop scanning with >95% accuracy for IADLs.   Time 4   Period  Weeks   Status New   OT SHORT TERM GOAL #3   Title Pt will improve R hand coordination for ADLs as shown by improving time on 9-hole peg test by at least 5sec.   Baseline 35.41sec   Time 4   Period Weeks   Status New           OT Long Term Goals - 09/27/15 2012    OT LONG TERM GOAL #1   Title Pt will be independent with HEP for coordination, R shoulder, and L hand strengthening.--check LTGs 11/09/15   Time 6   Period Weeks   Status New   OT LONG TERM GOAL #2   Title Pt will perform environmental scanning with at least 95% accuracy in busy environment for improved safety for ADLs.   Time 6   Period Weeks   Status New   OT LONG TERM GOAL #3   Title Pt will write at least 5 sentences with  good legibility and good letter/word spacing.   Baseline --   Time 6   Period Weeks   Status New   OT LONG TERM GOAL #4   Title Pt will demo at least 40lbs L grip strength for ADLs.   Baseline 33lbs   Time 6   Period Weeks   Status New             Patient will benefit from skilled therapeutic intervention in order to improve the following deficits and impairments:     Visit Diagnosis: Other lack of coordination  Muscle weakness (generalized)  Visuospatial deficit    Problem List Patient Active Problem List   Diagnosis Date Noted  . 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 Falls   . Acute deep vein thrombosis (DVT) of left femoral vein (HCC) 07/01/2015  . Pain   . Poor fluid intake   . Essential hypertension   . Type 2 diabetes mellitus with complication, without long-term current use of insulin (HCC)   . Disorientation   . Convulsions (HCC)   . Acute blood loss anemia   . Hypokalemia   . Ileus (HCC)   . Leukocytosis   . Right knee pain   . Cognitive safety issue   . Impulsiveness   . PVC (premature ventricular contraction)   . Abdominal discomfort   . SAH (subarachnoid hemorrhage) (HCC) 06/11/2015  . Emesis   . Subarachnoid hemorrhage (HCC) 06/10/2015  . Hypertension   . Diabetes mellitus without complication [facility](HCC)     [facility],Syd Manges 10/09/2015, 8:53 AM  [facility] [address] [place], [place], [address] Phone: [phone]   Fax:  [phone]  Name: Darryl [name] MRN: [id number] Date of Birth: [birth year]  Willa FraterAngela Sherhonda Gaspar, OTR/L [facility] [address] [place], [place]  [address] [phone] phone [phone] 10/09/2015 8:53 AM

## 2015-10-11 ENCOUNTER — Encounter: Payer: Self-pay | Admitting: Occupational Therapy

## 2015-10-11 ENCOUNTER — Ambulatory Visit: Payer: BC Managed Care – PPO | Admitting: Occupational Therapy

## 2015-10-11 DIAGNOSIS — R41842 Visuospatial deficit: Secondary | ICD-10-CM

## 2015-10-11 DIAGNOSIS — R278 Other lack of coordination: Secondary | ICD-10-CM

## 2015-10-11 DIAGNOSIS — I69118 Other symptoms and signs involving cognitive functions following nontraumatic intracerebral hemorrhage: Secondary | ICD-10-CM

## 2015-10-11 DIAGNOSIS — M6281 Muscle weakness (generalized): Secondary | ICD-10-CM

## 2015-10-11 DIAGNOSIS — R2681 Unsteadiness on feet: Secondary | ICD-10-CM | POA: Diagnosis not present

## 2015-10-11 DIAGNOSIS — R41841 Cognitive communication deficit: Secondary | ICD-10-CM

## 2015-10-11 NOTE — Therapy (Signed)
[facility] [address] [place], [place], [address] Phone: [phone]   Fax:  [phone]  Occupational Therapy Treatment  Patient Details  Name: Darryl [name] MRN: [id number] Date of Birth: 1950/10/06 Referring Provider: Dr. Faith Rogue  Encounter Date: 10/11/2015      OT End of Session - 10/11/15 1259    OT Start Time 0849   OT Stop Time 0932   OT Time Calculation (min) 43 min   Equipment Utilized During Treatment Red theraband; mat table   Activity Tolerance Patient tolerated treatment well   Behavior During Therapy [facility] for tasks assessed/performed      Past Medical History  Diagnosis Date  . Hypertension   . Seizures (HCC)   . Diabetes mellitus without complication (HCC)   . Acute deep vein thrombosis (DVT) of left femoral vein (HCC) 07/01/2015    Past Surgical History  Procedure Laterality Date  . Knee surgery    . Cholecystectomy    . Radiology with anesthesia N/A 06/11/2015    Procedure: RADIOLOGY WITH ANESTHESIA;  Surgeon: Lisbeth Renshaw, MD;  Location: [facility] OR;  Service: Radiology;  Laterality: N/A;    There were no vitals filed for this visit.      Subjective Assessment - 10/11/15 0853    Subjective  Pt reports that he has noticed decreased tremors. He denies pain and reports that he is performing HEP coordination ex's 1x/day.   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 No/denies                      OT Treatments/Exercises (OP) - 10/11/15 0001    Exercises   Exercises Shoulder   Shoulder Exercises: Supine   Protraction Strengthening;Both;10 reps   Horizontal ABduction Strengthening;Both;Theraband;15 reps   Theraband Level (Shoulder Horizontal ABduction) Level 2 (Red)   Flexion Strengthening;Right;15  reps;Theraband   Theraband Level (Shoulder Flexion) Level 2 (Red)   Shoulder Exercises: Seated   Extension Strengthening;Right;15 reps;Theraband   Theraband Level (Shoulder Extension) Level 2 (Red)   Retraction AROM;Both;10 reps  prone   Shoulder Exercises: Standing   Other Standing Exercises Angry Cat in standing at table x10 reps   Visual/Perceptual Exercises   Scanning Tabletop   Scanning - Tabletop Cross out designited letters, double letters etc. x3 worksheets  Increased time with these activities today @ 100%   Neurological Re-education Exercises   Scapular Stabilization Bilateral;10 reps;Quadraped                OT Education - 10/11/15 0916    Education provided Yes   Education Details Cont HEP as issued for shoulder stabilization, strengthening and coordination.   Person(s) Educated Patient   Methods Explanation;Demonstration;Other (comment);Verbal cues;Tactile cues  Pt has handouts in 3 ring binder   Comprehension Verbalized understanding;Returned demonstration;Need further instruction          OT Short Term Goals - 09/27/15 2030    OT SHORT TERM GOAL #1   Title Pt will verbalize understanding of adaptive/compensation strategies for ADLs (including compensation strategies for writing and tremors).--check STGs 10/27/15   Time 4   Period Weeks   Status New   OT SHORT TERM GOAL #2   Title Pt will perform a variety of tabletop scanning with >95% accuracy for IADLs.   Time 4  Period Weeks   Status New   OT SHORT TERM GOAL #3   Title Pt will improve R hand coordination for ADLs as shown by improving time on 9-hole peg test by at least 5sec.   Baseline 35.41sec   Time 4   Period Weeks   Status New           OT Long Term Goals - 09/27/15 2012    OT LONG TERM GOAL #1   Title Pt will be independent with HEP for coordination, R shoulder, and L hand strengthening.--check LTGs 11/09/15   Time 6   Period Weeks   Status New   OT LONG TERM GOAL #2   Title Pt  will perform environmental scanning with at least 95% accuracy in busy environment for improved safety for ADLs.   Time 6   Period Weeks   Status New   OT LONG TERM GOAL #3   Title Pt will write at least 5 sentences with good legibility and good letter/word spacing.   Baseline --   Time 6   Period Weeks   Status New   OT LONG TERM GOAL #4   Title Pt will demo at least 40lbs L grip strength for ADLs.   Baseline 33lbs   Time 6   Period Weeks   Status New               Plan - 10/11/15 [id number]    Clinical Impression Statement Pt denies pain, he reports that he is performing HEP for scapular stabilization daily, he should benefit from cont strengthening, coordination and addressing vision/perception to assist in maximizing independence with ADL/IADL's.   Rehab Potential Good   Clinical Impairments Affecting Rehab Potential Memory deficits   OT Frequency 2x / week   OT Duration 6 weeks   OT Treatment/Interventions Self-care/ADL training;DME and/or AE instruction;Energy conservation;Building services engineerunctional Mobility Training;Therapeutic exercises;Patient/family education;Visual/perceptual remediation/compensation;Cognitive remediation/compensation;Therapeutic exercise;Moist Heat;Cryotherapy;Manual Therapy;Passive range of motion;Therapeutic activities;Balance training;Neuromuscular education   Plan Review Coordination; environmental/visual scanning    Consulted and Agree with Plan of Care Patient      Patient will benefit from skilled therapeutic intervention in order to improve the following deficits and impairments:  Decreased coordination, Impaired vision/preception, Decreased balance, Decreased cognition, Decreased knowledge of precautions, Decreased safety awareness, Impaired UE functional use, Decreased strength, Decreased knowledge of use of DME  Visit Diagnosis: Other lack of coordination  Muscle weakness (generalized)  Visuospatial deficit  Cognitive communication deficit  Other  symptoms and signs involving 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 Falls   . Acute deep vein thrombosis (DVT) of left femoral vein (HCC) 07/01/2015  . Pain   . Poor fluid intake   . Essential hypertension   . Type 2 diabetes mellitus with complication, without long-term current use of insulin (HCC)   . Disorientation   . Convulsions (HCC)   . Acute blood loss anemia   . Hypokalemia   . Ileus (HCC)   . Leukocytosis   . Right knee pain   . Cognitive safety issue   . Impulsiveness   . PVC (premature ventricular contraction)   . Abdominal discomfort   . SAH (subarachnoid hemorrhage) (HCC) 06/11/2015  . Emesis   . Subarachnoid hemorrhage (HCC) 06/10/2015  . Hypertension   . Diabetes mellitus without complication [facility](HCC)     Karolyna Bianchini Beth Dixon, OTR/L 10/11/2015,  1:02 PM  [facility] [address] [place], [place], [address] Phone: [phone]   Fax:  [phone]  Name: Tirrell Buchberger MRN: [id number] Date of Birth: 03/04/1951

## 2015-10-12 ENCOUNTER — Ambulatory Visit: Payer: BC Managed Care – PPO | Admitting: Physical Therapy

## 2015-10-12 ENCOUNTER — Ambulatory Visit: Payer: BC Managed Care – PPO

## 2015-10-12 ENCOUNTER — Encounter: Payer: Self-pay | Admitting: Physical Therapy

## 2015-10-12 DIAGNOSIS — R2681 Unsteadiness on feet: Secondary | ICD-10-CM | POA: Diagnosis not present

## 2015-10-12 DIAGNOSIS — R42 Dizziness and giddiness: Secondary | ICD-10-CM

## 2015-10-12 DIAGNOSIS — M6281 Muscle weakness (generalized): Secondary | ICD-10-CM

## 2015-10-12 DIAGNOSIS — R41841 Cognitive communication deficit: Secondary | ICD-10-CM

## 2015-10-12 DIAGNOSIS — R2689 Other abnormalities of gait and mobility: Secondary | ICD-10-CM

## 2015-10-12 NOTE — Therapy (Signed)
[facility] [address] [place], [place], [address] Phone: [phone]   Fax:  [phone]  Speech Language Pathology Treatment  Patient Details  Name: Darryl [name] MRN: [id number] Date of Birth: 1951-02-18 Referring Provider: Dr. Faith RogueZachary Swartz  Encounter Date: 10/12/2015      End of Session - 10/12/15 1442    Visit Number 7   Number of Visits 17   Date for SLP Re-Evaluation 11/01/15   SLP Start Time 1406   SLP Stop Time  1446   SLP Time Calculation (min) 40 min   Activity Tolerance Patient tolerated treatment well      Past Medical History  Diagnosis Date  . Hypertension   . Seizures (HCC)   . Diabetes mellitus without complication (HCC)   . Acute deep vein thrombosis (DVT) of left femoral vein (HCC) 07/01/2015    Past Surgical History  Procedure Laterality Date  . Knee surgery    . Cholecystectomy    . Radiology with anesthesia N/A 06/11/2015    Procedure: RADIOLOGY WITH ANESTHESIA;  Surgeon: Lisbeth RenshawNeelesh Nundkumar, MD;  [facility];  Service: Radiology;  Laterality: N/A;    There were no vitals filed for this visit.      Subjective Assessment - 10/12/15 1408    Currently in Pain? No/denies               ADULT SLP TREATMENT - 10/12/15 1409    General Information   Behavior/Cognition Alert;Cooperative;Pleasant mood   Treatment Provided   Treatment provided Cognitive-Linquistic   Cognitive-Linquistic Treatment   Treatment focused on Cognition   Skilled Treatment Mod complex deductive reasoning puzzle was used by SLP to target attention to detail, reasoning skills, and alternating attention. Rare min A needed for alternating attention between clues and grid, and occasional verbal cues back to task with conversation. Pt req'd min-mod A with reasoning/problem solving skills, occasionally. In a detailed organization task, pt req'd cues how to set up task (holiday dates and putting in numerical  order) as he began numbering holidays prior to finding dates. Pt returned task back to SLP without completing dates column and told SLP task was completed. When cued by SLP he acknwledged he had not completed dates column and began to do so, however forgot to write the dates down and just checked his ordering of the dates. SLP cue needed for pt to realize his error.    Assessment / Recommendations / Plan   Plan Continue with current plan of care   Progression Toward Goals   Progression toward goals Progressing toward goals            SLP Short Term Goals - 10/12/15 1458    SLP SHORT TERM GOAL #1   Title Pt will utlize external aids for recall of scedule, daily tasks and events over 3 sessions.    Baseline 10/09/15,   Time 2   Period Weeks   Status On-going   SLP SHORT TERM GOAL #2   Title Pt will attend to details on moderately complex cognitive linguistic tasks with rare min A   Time 2   Period Weeks   Status On-going   SLP SHORT TERM GOAL #3   Title Pt will sustain attention to cognitive linguistic tasks in distracting environment with rare min A   Time 2   Period Weeks   Status Achieved          SLP Long Term Goals - 10/12/15 1458    SLP  LONG TERM GOAL #1   Title Pt will divide attention between 2 moderately complex cognitive linguistic tasks with 85% on each and rare min A.   Time 6   Period Weeks   Status On-going   SLP LONG TERM GOAL #2   Title Pt will utilize compenstory strategies to recall new information (paragraphs, messages, etc) 85% accuracy and rare min A   Time 6   Period Weeks   Status On-going          Plan - 10/12/15 1442    Clinical Impression Statement SLP A was necessary, consistently, on moderately complex cogntive linguistic tasks for attention to detail, alternating attention, and error awareness. Continue skilled ST to maximize cognition for possible return to work and improved independence.     Speech Therapy Frequency 2x / week    Treatment/Interventions Cognitive reorganization;SLP instruction and feedback;Internal/external aids;Compensatory strategies;Patient/family education;Functional tasks;Cueing hierarchy   Potential to Achieve Goals Good   Potential Considerations Ability to learn/carryover information   Consulted and Agree with Plan of Care Patient      Patient will benefit from skilled therapeutic intervention in order to improve the following deficits and impairments:   Cognitive communication deficit    Problem List Patient Active Problem List   Diagnosis Date Noted  . 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 Falls   . Acute deep vein thrombosis (DVT) of left femoral vein (HCC) 07/01/2015  . Pain   . Poor fluid intake   . Essential hypertension   . Type 2 diabetes mellitus with complication, without long-term current use of insulin (HCC)   . Disorientation   . Convulsions (HCC)   . Acute blood loss anemia   . Hypokalemia   . Ileus (HCC)   . Leukocytosis   . Right knee pain   . Cognitive safety issue   . Impulsiveness   . PVC (premature ventricular contraction)   . Abdominal discomfort   . SAH (subarachnoid hemorrhage) (HCC) 06/11/2015  . Emesis   . Subarachnoid hemorrhage (HCC) 06/10/2015  . Hypertension   . Diabetes mellitus without complication (HCC)     SCHINKE,CARL ,MS, CCC-SLP  10/12/2015, 2:59 PM  [facility] [address] [place], [place], [address] Phone: [phone]   Fax:  [phone]   Name: Darryl [name] MRN: [id number] Date of Birth: 12/03/1950

## 2015-10-12 NOTE — Patient Instructions (Signed)
  Please complete the assigned speech therapy homework and return it to your next session.  

## 2015-10-15 NOTE — Therapy (Signed)
[facility] [address] [place], [place], [address] Phone: [phone]   Fax:  [phone]  Physical Therapy Treatment  Patient Details  Name: Darryl [name] MRN: [id number] Date of Birth: 05/09/1951 Referring Provider: Alger Simons, MD  Encounter Date: 10/12/2015   10/12/15 1500  PT Visits / Re-Eval  Visit Number 9  Number of Visits 17  Date for PT Re-Evaluation 12/03/15  Authorization  Authorization Type BC[employer]; no visit limit, no auth required  PT Time Calculation  PT Start Time 1450  PT Stop Time 1530  PT Time Calculation (min) 40 min  PT - End of Session  Activity Tolerance Patient tolerated treatment well  Behavior During Therapy [facility] for tasks assessed/performed     Past Medical History  Diagnosis Date  . Hypertension   . Seizures ([place])   . Diabetes mellitus without complication ([place])   . Acute deep vein thrombosis (DVT) of left femoral vein ([place]) 07/01/2015    Past Surgical History  Procedure Laterality Date  . Knee surgery    . Cholecystectomy    . Radiology with anesthesia N/A 06/11/2015    Procedure: RADIOLOGY WITH ANESTHESIA;  Surgeon: [name], MD;  Location: [facility];  Service: Radiology;  Laterality: N/A;    There were no vitals filed for this visit.     10/12/15 1451  Symptoms/Limitations  Subjective No new complaints. One fall yesterday. Fell backwards while walking down stairs, ended with sitting down on stairs. Did not hit head. Was using one rail/cane combo, going down reciprocally. Also had something in his hand so only half holding the rail. Also had a fall last night. Stood up from low sofa and turned to look back. [place] backwards this time as well. Landed on buttocks on floor. Some pain in right lateral hip from hitting the coffee table. Did not hit his head. Hip pain is more soreness today vs yesterday. Was able to get self up both times. Reports no dizziness or headache prior to  either fall.                                                  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   Patient Stated Goals "I want to get stronger in my right leg; to get rid of the cane; and to work on balance."  Pain Assessment  Currently in Pain? Yes  Pain Score 3  Pain Location Leg (inner thigh)  Pain Orientation Right  Pain Descriptors / Indicators Sore  Pain Type Acute pain  Pain Onset More than a month ago  Pain Frequency Constant  Aggravating Factors  over exercise  Pain Relieving Factors rest, walking, certain exercises    Treatment: Reviewed HEP issued at previous session. Cues needed on correct form and technique. Performed x 10 reps each one.       10/12/15 1528  Neuro Re-ed   Neuro Re-ed Details  gait with cane while scanning for playing cards x 5 laps, pt missing 3-4 different cards each time.            PT Short Term Goals - 10/04/15 2040    PT SHORT TERM GOAL #1   Title Pt will perform initial home exercise program  with mod I using paper handout to indicate safe HEP compliance. (Target date: 10/04/15)   Baseline Pt reports compliance with walking program and demonstrates x1 viewing effectively.   Status Achieved   PT SHORT TERM GOAL #2   Title Pt will increase self-selected gait speed from 1.66 ft/sec > 1.8 ft/sec to indicate decreased risk of recurrent falls.    Baseline 5/2: gait velocity = 3.18 ft/sec   Status Achieved   PT SHORT TERM GOAL #3   Title Pt will increase Berg from 31/56 to 35/56 to indicate improved standing balance.    Baseline 5/2: Berg score = 54/56.   Status Achieved   PT SHORT TERM GOAL #4   Title Pt will ambulate 200' over level, indoor surfaces with mod I using LRAD to indicate increased safety with household mobility.     Status Achieved   PT SHORT TERM GOAL #5   Title Pt will negotiate 14 stairs  with 2 rails with mod I to enable progress toward safety/independence accessing second floor of home.    Baseline Met 5/2.   Status Achieved   PT SHORT TERM GOAL #6   Title Pt will negotiate 3 stairs without rails with mod I using LRAD to enable pt to safely access second floor of home.    Baseline Met 5/4.   Status Achieved   PT SHORT TERM GOAL #7   Title Pt will negotiate 6 stairs with 1 rail with mod I to enable pt to safely use primary home entrance.   Of 14 stairs to access second floor, 3 stairs have no rails.    Baseline Met 5/2.   Status Achieved   PT SHORT TERM GOAL #8   Title Pt will perform floor transfer with min A using standard chair or mat table to progress toward safety with fall recovery.   Baseline Met 5/2.   Status Achieved           PT Long Term Goals - 10/04/15 2041    PT LONG TERM GOAL #1   Title Pt will increase self-selected gait speed from 1.66 ft/sec to > / = 2.26 ft/sec to demonstrate increased efficiency of ambulation.   (Target date: 11/01/15)   Baseline 5/2: gait velocity = 3.18 ft/sec   Status Achieved   PT LONG TERM GOAL #2   Title Pt will improve Berg score from 31/56 to 39/56 to indicate significant improvement in functional standing balance.    Baseline 5/2: [name] = 54/56   Status Achieved   PT LONG TERM GOAL #3   Title Pt will independently ambulate 1,000' over unlevel, paved surfaces with no rest breaks to indicate improved endurance and increased safety with community mobility.   Baseline 5/2: REVISED from mod I to independent without AD due to pt progress.   Status Revised   PT LONG TERM GOAL #4   Title Pt will independently negotiate standard ramp and curb step to indicate increased safety traversing community obstacles.    Baseline 5/2: REVISED from mod I to independent without AD due to pt progress.   Status Revised   PT LONG TERM GOAL #5   Title Pt will perform floor transfer with mod I using standard chair or mat table to indicate  increased safety with fall recovery.   Baseline Met 5/2.   Status Achieved   Additional Long Term Goals   Additional Long Term Goals Yes   PT LONG TERM GOAL #6   Title Pt will perform all above  aspects of functional mobility with no more than 2-point increase in pain rating to indicate decreased impact of pain on functional activity tolerance.   Status On-going   PT LONG TERM GOAL #7   Title Pt will improve FGA score from 11/20 to >/= 19/20 to indicate decreased fall risk.   (Target date: 11/01/15)   Status New   PT LONG TERM GOAL #8   Title Pt will demonstrate use of strategy to mitigate dizziness without cueing from PT to demonstrate pt independence managing symptoms.  (Target date: 11/01/15)   Status New        10/12/15 1500  Plan  Clinical Impression Statement Pt able to perform HEP issued at previous visit with cues and handout. Remainder of session continued to focus on high level balance activities. Pt is making steady progress toward goals. Primary PT notified of pt's recent falls.  Pt will benefit from skilled therapeutic intervention in order to improve on the following deficits Postural dysfunction;Abnormal gait;Decreased activity tolerance;Decreased balance;Decreased endurance;Decreased coordination;Dizziness;Decreased knowledge of use of DME;Pain;Decreased strength;Decreased safety awareness;Impaired perceived functional ability;Impaired vision/preception  Rehab Potential Good  Clinical Impairments Affecting Rehab Potential positive: age, supportive wife; negative: decreased awareness, chronic R knee pain  PT Frequency 2x / week  PT Duration 8 weeks  PT Treatment/Interventions ADLs/Self Care Home Management;Vestibular;DME Instruction;Gait training;Stair training;Functional mobility training;Therapeutic activities;Therapeutic exercise;Balance training;Orthotic Fit/Training;Patient/family education;Neuromuscular re-education;Visual/perceptual remediation/compensation  PT Next Visit  Plan Continue hip strengthening (emphasis on R), dynamic gait, high level balance.  Consulted and Agree with Plan of Care Patient       Patient will benefit from skilled therapeutic intervention in order to improve the following deficits and impairments:  Postural dysfunction, Abnormal gait, Decreased activity tolerance, Decreased balance, Decreased endurance, Decreased coordination, Dizziness, Decreased knowledge of use of DME, Pain, Decreased strength, Decreased safety awareness, Impaired perceived functional ability, Impaired vision/preception  Visit Diagnosis: Muscle weakness (generalized)  Unsteadiness on feet  Other abnormalities of gait and mobility  Dizziness and giddiness     Problem List Patient Active Problem List   Diagnosis Date Noted  . 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 Falls   . Acute deep vein thrombosis (DVT) of left femoral vein ([place]) 07/01/2015  . Pain   . Poor fluid intake   . Essential hypertension   . Type 2 diabetes mellitus with complication, without long-term current use of insulin ([place])   . Disorientation   . Convulsions ([place])   . Acute blood loss anemia   . Hypokalemia   . Ileus ([place])   . Leukocytosis   . Right knee pain   . Cognitive safety issue   . Impulsiveness   . PVC (premature ventricular contraction)   . Abdominal discomfort   . SAH (subarachnoid hemorrhage) ([place]) 06/11/2015  . Emesis   . Subarachnoid hemorrhage ([facility]) 06/10/2015  . Hypertension   . Diabetes mellitus without complication ([place])    Willow Ora, PTA, [facility] [address], [place], [place] [address] [phone] 10/15/2015, 12:44 AM   Name: Darryl [name] MRN: [id number] Date of Birth: Feb 26, 1951

## 2015-10-16 ENCOUNTER — Ambulatory Visit: Payer: BC Managed Care – PPO | Admitting: Speech Pathology

## 2015-10-16 ENCOUNTER — Ambulatory Visit: Payer: BC Managed Care – PPO | Admitting: Occupational Therapy

## 2015-10-16 ENCOUNTER — Ambulatory Visit: Payer: BC Managed Care – PPO | Admitting: Physical Therapy

## 2015-10-16 DIAGNOSIS — M6281 Muscle weakness (generalized): Secondary | ICD-10-CM

## 2015-10-16 DIAGNOSIS — R278 Other lack of coordination: Secondary | ICD-10-CM

## 2015-10-16 DIAGNOSIS — R2681 Unsteadiness on feet: Secondary | ICD-10-CM | POA: Diagnosis not present

## 2015-10-16 DIAGNOSIS — R2689 Other abnormalities of gait and mobility: Secondary | ICD-10-CM

## 2015-10-16 DIAGNOSIS — R41841 Cognitive communication deficit: Secondary | ICD-10-CM

## 2015-10-16 NOTE — Therapy (Signed)
[facility] [address] [place], [place], [address] Phone: [phone]   Fax:  [phone]  Speech Language Pathology Treatment  Patient Details  Name: Darryl [name] MRN: [id number] Date of Birth: Oct 30, 1950 Referring Provider: Dr. Faith RogueZachary Swartz  Encounter Date: 10/16/2015      End of Session - 10/16/15 1447    Visit Number 8   Number of Visits 17   Date for SLP Re-Evaluation 11/01/15   SLP Start Time 1402   SLP Stop Time  1447   SLP Time Calculation (min) 45 min      Past Medical History  Diagnosis Date  . Hypertension   . Seizures ([facility])   . Diabetes mellitus without complication ([facility])   . Acute deep vein thrombosis (DVT) of left femoral vein ([facility]) 07/01/2015    Past Surgical History  Procedure Laterality Date  . Knee surgery    . Cholecystectomy    . Radiology with anesthesia N/A 06/11/2015    Procedure: RADIOLOGY WITH ANESTHESIA;  Surgeon: Lisbeth RenshawNeelesh Nundkumar, MD;  [facility];  Service: Radiology;  Laterality: N/A;    There were no vitals filed for this visit.      Subjective Assessment - 10/16/15 1409    Subjective "I can now read and keep up with the ticker at the bottom of the screen on the news!"  Pt also returned to church for the 1st time since aneurysm               ADULT SLP TREATMENT - 10/16/15 1402    General Information   Behavior/Cognition Alert;Cooperative;Pleasant mood   Treatment Provided   Treatment provided Cognitive-Linquistic   Pain Assessment   Pain Assessment No/denies pain   Cognitive-Linquistic Treatment   Treatment focused on Cognition   Skilled Treatment Pt reported mild word finding episodes and forgetting people's names. He also reports improved reading comprehension/retention. Attention to detail reading car maintence schedule and answering questions /simple math re: schedule with rare min cues for attention to detail. Pt ID'd error with mod I and self  corrected. Divide attention between category writing and attending to card flip, identifying matches, with 80% accuracy with cards and 70% accuracy with naming task.   Assessment / Recommendations / Plan   Plan Continue with current plan of care            SLP Short Term Goals - 10/16/15 1445    SLP SHORT TERM GOAL #1   Title Pt will utlize external aids for recall of scedule, daily tasks and events over 3 sessions.    Baseline 10/09/15, 10/16/15,   Time 1   Period Weeks   Status On-going   SLP SHORT TERM GOAL #2   Title Pt will attend to details on moderately complex cognitive linguistic tasks with rare min A   Time 1   Period Weeks   Status On-going   SLP SHORT TERM GOAL #3   Title Pt will sustain attention to cognitive linguistic tasks in distracting environment with rare min A   Time 1   Period Weeks   Status Achieved          SLP Long Term Goals - 10/16/15 1447    SLP LONG TERM GOAL #1   Title Pt will divide attention between 2 moderately complex cognitive linguistic tasks with 85% on each and rare min A.   Time 5   Period Weeks   Status On-going   SLP LONG TERM GOAL #2  Title Pt will utilize compenstory strategies to recall new information (paragraphs, messages, etc) 85% accuracy and rare min A   Time 5   Period Weeks   Status On-going          Plan - 10/16/15 1443    Clinical Impression Statement Pt reporting improved success attending to reading and TV tickers. Continues to require min A for error awareness, alternating attention and attention to detail. Continue skilled ST to maximize cognition for possible return to work   Speech Therapy Frequency 2x / week   Treatment/Interventions Cognitive reorganization;SLP instruction and feedback;Internal/external aids;Compensatory strategies;Patient/family education;Functional tasks;Cueing hierarchy   Potential to Achieve Goals Good   Potential Considerations Ability to learn/carryover information   Consulted and  Agree with Plan of Care Patient      Patient will benefit from skilled therapeutic intervention in order to improve the following deficits and impairments:   Cognitive communication deficit    Problem List Patient Active Problem List   Diagnosis Date Noted  . 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 Falls   . Acute deep vein thrombosis (DVT) of left femoral vein ([facility]) 07/01/2015  . Pain   . Poor fluid intake   . Essential hypertension   . Type 2 diabetes mellitus with complication, without long-term current use of insulin ([facility])   . Disorientation   . Convulsions ([facility])   . Acute blood loss anemia   . Hypokalemia   . Ileus ([facility])   . Leukocytosis   . Right knee pain   . Cognitive safety issue   . Impulsiveness   . PVC (premature ventricular contraction)   . Abdominal discomfort   . SAH (subarachnoid hemorrhage) ([facility]) 06/11/2015  . Emesis   . Subarachnoid hemorrhage ([facility]) 06/10/2015  . Hypertension   . Diabetes mellitus without complication ([facility])     Ramar Nobrega, Radene Journey  MS, CCC-SLP  10/16/2015, 2:51 PM  [facility] [address] [place], [place], [address] Phone: [phone]   Fax:  [phone]   Name: Jameison Haji MRN: [id number] Date of Birth: 1950-06-17

## 2015-10-16 NOTE — Patient Instructions (Signed)
1. Grip Strengthening (Resistive Putty)   Squeeze putty using thumb and all fingers. Repeat _20___ times. Do __2__ sessions per day.   2. Roll putty into tube on table and pinch between each finger and thumb x 10 reps each. (can do ring and small finger together)     Copyright  VHI. All rights reserved.   

## 2015-10-16 NOTE — Therapy (Signed)
[facility] [address] [place], [place], [address] Phone: [phone]   Fax:  [phone]  Physical Therapy Treatment  Patient Details  Name: Darryl [name] MRN: [id number] Date of Birth: [birth year] Referring Provider: Alger Simons, MD  Encounter Date: 10/16/2015      PT End of Session - 10/16/15 1919    Visit Number 10   Number of Visits 17   Date for PT Re-Evaluation 12/03/15   Authorization Type BCBS; no visit limit, no auth required   PT Start Time 1450   PT Stop Time 1530   PT Time Calculation (min) 40 min   Activity Tolerance Patient tolerated treatment well;No increased pain   Behavior During Therapy [facility] for tasks assessed/performed      Past Medical History  Diagnosis Date  . Hypertension   . Seizures ([place])   . Diabetes mellitus without complication ([place])   . Acute deep vein thrombosis (DVT) of left femoral vein ([place]) 07/01/2015    Past Surgical History  Procedure Laterality Date  . Knee surgery    . Cholecystectomy    . Radiology with anesthesia N/A 06/11/2015    Procedure: RADIOLOGY WITH ANESTHESIA;  Surgeon: [name], MD;  Location: [facility];  Service: Radiology;  Laterality: N/A;    There were no vitals filed for this visit.      Subjective Assessment - 10/16/15 1450    Subjective "I have not fallen. 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    Patient Stated Goals "I want to get stronger in my right leg; to get rid of the cane; and to work on balance."   Currently in Pain? Yes   Pain Score 1    Pain Location Hip   Pain Orientation Right   Pain Descriptors / Indicators Aching   Pain Type Chronic pain   Pain Onset More than a month ago   Pain Frequency Intermittent   Aggravating  Factors  "over exercise," per pt.    Pain Relieving Factors stretching, rest   Multiple Pain Sites No            [facility] PT Assessment - 10/16/15 0001    AROM   Overall AROM  Deficits   Overall AROM Comments Pain with eccentric contraction of R hip flexors. Limited active R hip extension, internal rotation, and adduction.   PROM   Overall PROM  Deficits   Overall PROM Comments Limited and painful (concordant) R hip internal rotation, extension, and adductionw with capsular endfeel.                      [facility] Adult PT Treatment/Exercise - 10/16/15 0001    Exercises   Exercises Knee/Hip   Other Exercises  Seated R hip internal rotation self-stretch 2 x30 -sec holds. Pt demonstrates difficulty with technique despite verbal/demo cueing.   Knee/Hip Exercises: Stretches   Sports administrator Right;60 seconds;1 rep   Sports administrator Limitations Attempted supine [employer]; however, pt unable to feel adequate stretch, so transitioned to prone with strap (see below).    Hip Flexor Stretch Right;3 reps;60 seconds   Hip Flexor Stretch Limitations Prone with pillow under R thigh using strap   Knee/Hip Exercises: Supine   Single Leg Bridge Strengthening;Right;1 set;5 reps  Pt continues to demo  difficulty; removed from HEP.   Straight Leg Raises AROM;Strengthening;Right;1 set;20 reps   Straight Leg Raises Limitations Pt able to perform x20 reps without extension lag; removed from HEP.   Other Supine Knee/Hip Exercises Supine bent knee fallout 10-sec holds x10 reps with cueing for technique at beginning of session. At end of session, pt with effective wthin-session carryover of bent knee fallouts 10-sec holds x3 reps.   Other Supine Knee/Hip Exercises Supine: R hip internal rotation self-stretch 2 x45-sec holds at beginning of session. Pt required use of paper handout and min cueing for technique when reviewing post-session x45-sec hold.   Knee/Hip Exercises: Sidelying   Clams x20 reps on R  with cueing for technique at beginning of session. Reviewed at end of session, at which time pt performed x10 reps with proper alignment/technique.   Knee/Hip Exercises: Prone   Hip Extension AROM;Strengthening;Right;2 sets;5 reps   Hip Extension Limitations AROM limited by decreased extensibility of R iliopsoas.                PT Education - 10/16/15 1903    Education provided Yes   Education Details Modified current HEP for RLE stretching/strengthening.   Person(s) Educated Patient   Methods Explanation;Demonstration;Tactile cues;Handout;Verbal cues   Comprehension Verbalized understanding;Need further instruction;Returned demonstration  May need further reinforcement in future sessions          PT Short Term Goals - 10/04/15 2040    PT SHORT TERM GOAL #1   Title Pt will perform initial home exercise program with mod I using paper handout to indicate safe HEP compliance. (Target date: 10/04/15)   Baseline Pt reports compliance with walking program and demonstrates x1 viewing effectively.   Status Achieved   PT SHORT TERM GOAL #2   Title Pt will increase self-selected gait speed from 1.66 ft/sec > 1.8 ft/sec to indicate decreased risk of recurrent falls.    Baseline 5/2: gait velocity = 3.18 ft/sec   Status Achieved   PT SHORT TERM GOAL #3   Title Pt will increase Berg from 31/56 to 35/56 to indicate improved standing balance.    Baseline 5/2: Berg score = 54/56.   Status Achieved   PT SHORT TERM GOAL #4   Title Pt will ambulate 200' over level, indoor surfaces with mod I using LRAD to indicate increased safety with household mobility.     Status Achieved   PT SHORT TERM GOAL #5   Title Pt will negotiate 14 stairs with 2 rails with mod I to enable progress toward safety/independence accessing second floor of home.    Baseline Met 5/2.   Status Achieved   PT SHORT TERM GOAL #6   Title Pt will negotiate 3 stairs without rails with mod I using LRAD to enable pt to safely  access second floor of home.    Baseline Met 5/4.   Status Achieved   PT SHORT TERM GOAL #7   Title Pt will negotiate 6 stairs with 1 rail with mod I to enable pt to safely use primary home entrance.   Of 14 stairs to access second floor, 3 stairs have no rails.    Baseline Met 5/2.   Status Achieved   PT SHORT TERM GOAL #8   Title Pt will perform floor transfer with min A using standard chair or mat table to progress toward safety with fall recovery.   Baseline Met 5/2.   Status Achieved           PT Long Term  Goals - 10/04/15 2041    PT LONG TERM GOAL #1   Title Pt will increase self-selected gait speed from 1.66 ft/sec to > / = 2.26 ft/sec to demonstrate increased efficiency of ambulation.   (Target date: 11/01/15)   Baseline 5/2: gait velocity = 3.18 ft/sec   Status Achieved   PT LONG TERM GOAL #2   Title Pt will improve Berg score from 31/56 to 39/56 to indicate significant improvement in functional standing balance.    Baseline 5/2: [name] = 54/56   Status Achieved   PT LONG TERM GOAL #3   Title Pt will independently ambulate 1,000' over unlevel, paved surfaces with no rest breaks to indicate improved endurance and increased safety with community mobility.   Baseline 5/2: REVISED from mod I to independent without AD due to pt progress.   Status Revised   PT LONG TERM GOAL #4   Title Pt will independently negotiate standard ramp and curb step to indicate increased safety traversing community obstacles.    Baseline 5/2: REVISED from mod I to independent without AD due to pt progress.   Status Revised   PT LONG TERM GOAL #5   Title Pt will perform floor transfer with mod I using standard chair or mat table to indicate increased safety with fall recovery.   Baseline Met 5/2.   Status Achieved   Additional Long Term Goals   Additional Long Term Goals Yes   PT LONG TERM GOAL #6   Title Pt will perform all above aspects of functional mobility with no more than 2-point increase in  pain rating to indicate decreased impact of pain on functional activity tolerance.   Status On-going   PT LONG TERM GOAL #7   Title Pt will improve FGA score from 11/20 to >/= 19/20 to indicate decreased fall risk.   (Target date: 11/01/15)   Status New   PT LONG TERM GOAL #8   Title Pt will demonstrate use of strategy to mitigate dizziness without cueing from PT to demonstrate pt independence managing symptoms.  (Target date: 11/01/15)   Status New               Plan - 10/16/15 1936    Clinical Impression Statement Pt continues to report pain in R hip, which limits standing and walking tolerance. Limited R hip PROM follows capsular pattern and pain is relieved by manual distraction. Current session focused on progressing/modifying current HEP to focus mainly on stretching R hip.    Rehab Potential Good   Clinical Impairments Affecting Rehab Potential positive: age, supportive wife; negative: decreased awareness, chronic R knee pain   PT Frequency 2x / week   PT Duration 8 weeks   PT Treatment/Interventions ADLs/Self Care Home Management;Vestibular;DME Instruction;Gait training;Stair training;Functional mobility training;Therapeutic activities;Therapeutic exercise;Balance training;Orthotic Fit/Training;Patient/family education;Neuromuscular re-education;Visual/perceptual remediation/compensation   PT Next Visit Plan Continue stretching/strengthening R hip, dynamic gait, high level balance.   Consulted and Agree with Plan of Care Patient      Patient will benefit from skilled therapeutic intervention in order to improve the following deficits and impairments:  Postural dysfunction, Abnormal gait, Decreased activity tolerance, Decreased balance, Decreased endurance, Decreased coordination, Dizziness, Decreased knowledge of use of DME, Pain, Decreased strength, Decreased safety awareness, Impaired perceived functional ability, Impaired vision/preception  Visit Diagnosis: Muscle weakness  (generalized)  Other abnormalities of gait and mobility     Problem List Patient Active Problem List   Diagnosis Date Noted  . Attention and concentration deficit following nontraumatic intracerebral  hemorrhage 07/25/2015  . Falls   . Acute deep vein thrombosis (DVT) of left femoral vein ([place]) 07/01/2015  . Pain   . Poor fluid intake   . Essential hypertension   . Type 2 diabetes mellitus with complication, without long-term current use of insulin ([place])   . Disorientation   . Convulsions ([place])   . Acute blood loss anemia   . Hypokalemia   . Ileus ([place])   . Leukocytosis   . Right knee pain   . Cognitive safety issue   . Impulsiveness   . PVC (premature ventricular contraction)   . Abdominal discomfort   . SAH (subarachnoid hemorrhage) ([place]) 06/11/2015  . Emesis   . Subarachnoid hemorrhage ([place]) 06/10/2015  . Hypertension   . Diabetes mellitus without complication ([place])     Billie Ruddy, PT, DPT [facility] [address] [place], [place], [address] Phone: [phone]   Fax:  [phone] 10/16/2015, 7:41 PM  Name: Lavan Imes MRN: [id number] Date of Birth: 12-30-1950

## 2015-10-16 NOTE — Therapy (Addendum)
[facility] [address] [place], [place], [address] Phone: [phone]   Fax:  [phone]  Occupational Therapy Treatment  Patient Details  Name: Darryl [name] MRN: [id number] Date of Birth: 1950/10/14 Referring Provider: Dr. Alger Simons  Encounter Date: 10/16/2015      OT End of Session - 10/16/15 1305    Visit Number 5   Number of Visits 13   Date for OT Re-Evaluation 11/09/15   Authorization Type BCBS, no visit limit, no auth needed   OT Start Time 1157  pt arrived late   OT Stop Time 1230   OT Time Calculation (min) 33 min   Behavior During Therapy [facility] for tasks assessed/performed      Past Medical History  Diagnosis Date  . Hypertension   . Seizures ([place])   . Diabetes mellitus without complication ([place])   . Acute deep vein thrombosis (DVT) of left femoral vein ([place]) 07/01/2015    Past Surgical History  Procedure Laterality Date  . Knee surgery    . Cholecystectomy    . Radiology with anesthesia N/A 06/11/2015    Procedure: RADIOLOGY WITH ANESTHESIA;  Surgeon: [name], MD;  Location: [facility];  Service: Radiology;  Laterality: N/A;    There were no vitals filed for this visit.      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 Yes   Pain Score 3    Pain Location Leg   Pain Orientation Right   Pain Descriptors / Indicators Aching   Pain Type Acute pain   Pain Onset More than a month ago   Aggravating Factors  over exercise   Pain Relieving Factors repostioning   Effect of Pain on Daily Activities OT will not address leg pain   Multiple Pain Sites No          Treatment: Pt was instructed in red putty HEP for LUE, min  v.c./ demonstration Reviewed previous theraband and scapular HEP, 10-15 reps each, min v.c. Placing grooved pegs in pegboard with RUE fior increased fine motor coordination, min difficulty, v.c. Pt wrote a sentence with moderately small size and grossly 75% legibility, will address handwriting further.                    OT Education - 10/16/15 1306    Education provided Yes   Education Details red putty for LUE strength, theraband HEP red, scapular stability ex   Person(s) Educated Patient   Methods Explanation;Demonstration;Verbal cues;Handout   Comprehension Verbalized understanding;Returned demonstration;Verbal cues required          OT Short Term Goals - 09/27/15 2030    OT SHORT TERM GOAL #1   Title Pt will verbalize understanding of adaptive/compensation strategies for ADLs (including compensation strategies for writing and tremors).--check STGs 10/27/15   Time 4   Period Weeks   Status New   OT SHORT TERM GOAL #2   Title Pt will perform a variety of tabletop scanning with >95% accuracy for IADLs.   Time 4   Period Weeks   Status New   OT SHORT TERM GOAL #3   Title Pt will improve R hand coordination for ADLs as shown by improving time on  9-hole peg test by at least 5sec.   Baseline 35.41sec   Time 4   Period Weeks   Status New           OT Long Term Goals - 10/16/15 1200    OT LONG TERM GOAL #1   Title Pt will be independent with HEP for coordination, R shoulder, and L hand strengthening.--check LTGs 11/09/15   Time 6   Period Weeks   Status New   OT LONG TERM GOAL #2   Title Pt will perform environmental scanning with at least 95% accuracy in busy environment for improved safety for ADLs.   Time 6   Period Weeks   Status New   OT LONG TERM GOAL #3   Title Pt will write at least 5 sentences with good legibility and good letter/word spacing.   Time 6   Period Weeks   Status New   OT LONG TERM GOAL #4   Title Pt will demo at least 40lbs L  grip strength for ADLs.- upgrade goal to pt will demonstrate 50 lbs grip strength for LUE for increased ease with ADLS   Baseline 37CWU   Time 6   Period Weeks   Status Revised   goal met-45 lbs               Plan - 10/16/15 1303    Clinical Impression Statement Pt is progressing towards goals. He demonstrates understanding of HEP.   Rehab Potential Good   Clinical Impairments Affecting Rehab Potential Memory deficits   OT Frequency 2x / week   OT Duration 6 weeks   OT Treatment/Interventions Self-care/ADL training;DME and/or AE instruction;Energy conservation;Therapist, nutritional;Therapeutic exercises;Patient/family education;Visual/perceptual remediation/compensation;Cognitive remediation/compensation;Therapeutic exercise;Moist Heat;Cryotherapy;Manual Therapy;Passive range of motion;Therapeutic activities;Balance training;Neuromuscular education   Plan handwriting strategies   OT Home Exercise Plan coordination HEP and suggestions for reducing tremors issued 10/04/15, theraband, scapular stability ex, red putty   Consulted and Agree with Plan of Care Patient      Patient will benefit from skilled therapeutic intervention in order to improve the following deficits and impairments:  Decreased coordination, Impaired vision/preception, Decreased balance, Decreased cognition, Decreased knowledge of precautions, Decreased safety awareness, Impaired UE functional use, Decreased strength, Decreased knowledge of use of DME  Visit Diagnosis: Muscle weakness (generalized)  Other lack of coordination    Problem List Patient Active Problem List   Diagnosis Date Noted  . 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 Falls   . Acute deep vein thrombosis (DVT) of left femoral vein ([place]) 07/01/2015  . Pain   . Poor fluid intake   . Essential hypertension   . Type 2 diabetes mellitus with complication, without long-term current  use of insulin ([place])   . Disorientation   . Convulsions ([place])   . Acute blood loss anemia   . Hypokalemia   . Ileus ([place])   . Leukocytosis   . Right knee pain   . Cognitive safety issue   . Impulsiveness   . PVC (premature ventricular contraction)   . Abdominal discomfort   . SAH (subarachnoid hemorrhage) ([place]) 06/11/2015  . Emesis   . Subarachnoid hemorrhage ([place]) 06/10/2015  . Hypertension   . Diabetes mellitus without complication [facility])   Theone Murdoch, OTR/L [phone] Phone: [phone] 1:27 PM 10/16/2015  RINE,KATHRYN 10/16/2015, 1:27 PM [facility] [address] [place], [place], [address] Phone: [phone]   Fax:  [phone]  Name: Darryl [name] MRN: [id number] Date  of Birth: February 29, 1951

## 2015-10-16 NOTE — Patient Instructions (Addendum)
Abduction: Clam (Eccentric) - Side-Lying - RIGHT    Lie on side with knees bent. Lift top knee, keeping feet together. Keep trunk steady - hold onto edge of bed with top hand to prevent hip movement. Slowly lower for 3-5 seconds. _20__ reps per set, 2-3 times per day on the RIGHT leg. *You should feel this working in the muscle in the back of your right hip.  Adductor Stretch - Supine    Lie on bed, knees bent, feet flat. Keeping feet together, lower knees toward bed (as pictured) until stretch felt at inner thighs. Hold for 10 econds. Relax. Repeat. Perform 8-10 reps per day.   Extensors / Rotators, Supine - RIGHT   Lie supine, one leg straight, other leg bent, knee held by opposite hand. Gently pull knee toward opposite shoulder. Feel stretch in buttocks and outside of hip. Hold _45__ seconds. Repeat _2-3_ times per day on the RIGHT leg.  Hip Flexor, Quadricep Stretch: Belly Down (Strap)    Lie on your stomach with a pillow under your RIGHT thigh. Loop a belt around RIGHT foot/ankle. Use belt to gently pull knee into more bent position until you feel a gentle stretch in the front of your RIGHT hip/thigh. Hold for 45-60 seconds, 2-3 times per day on the RIGHT leg.

## 2015-10-18 ENCOUNTER — Ambulatory Visit: Payer: BC Managed Care – PPO | Admitting: Physical Therapy

## 2015-10-18 ENCOUNTER — Encounter: Payer: Self-pay | Admitting: Physical Therapy

## 2015-10-18 ENCOUNTER — Ambulatory Visit: Payer: BC Managed Care – PPO | Admitting: Speech Pathology

## 2015-10-18 ENCOUNTER — Ambulatory Visit: Payer: BC Managed Care – PPO | Admitting: Occupational Therapy

## 2015-10-18 DIAGNOSIS — R41842 Visuospatial deficit: Secondary | ICD-10-CM

## 2015-10-18 DIAGNOSIS — R278 Other lack of coordination: Secondary | ICD-10-CM

## 2015-10-18 DIAGNOSIS — R41841 Cognitive communication deficit: Secondary | ICD-10-CM

## 2015-10-18 DIAGNOSIS — R2681 Unsteadiness on feet: Secondary | ICD-10-CM | POA: Diagnosis not present

## 2015-10-18 DIAGNOSIS — M6281 Muscle weakness (generalized): Secondary | ICD-10-CM

## 2015-10-18 NOTE — Therapy (Signed)
[facility] [address] [place], [place], [address] Phone: [phone]   Fax:  [phone]  Speech Language Pathology Treatment  Patient Details  Name: Darryl [name] MRN: [id number] Date of Birth: 01-21-1951 Referring Provider: Dr. Alger Simons  Encounter Date: 10/18/2015      End of Session - 10/18/15 1515    Visit Number 9   Number of Visits 17   Date for SLP Re-Evaluation 11/01/15   SLP Start Time 1404   SLP Stop Time  1446   SLP Time Calculation (min) 42 min      Past Medical History  Diagnosis Date  . Hypertension   . Seizures ([place])   . Diabetes mellitus without complication ([place])   . Acute deep vein thrombosis (DVT) of left femoral vein ([place]) 07/01/2015    Past Surgical History  Procedure Laterality Date  . Knee surgery    . Cholecystectomy    . Radiology with anesthesia N/A 06/11/2015    Procedure: RADIOLOGY WITH ANESTHESIA;  Surgeon: [name], MD;  Location: [facility];  Service: Radiology;  Laterality: N/A;    There were no vitals filed for this visit.      Subjective Assessment - 10/18/15 1418    Subjective "I'm doing better each day - the homework was tedious."   Currently in Pain? No/denies               ADULT SLP TREATMENT - 10/18/15 1419    General Information   Behavior/Cognition Alert;Cooperative;Pleasant mood   Treatment Provided   Treatment provided Cognitive-Linquistic   Pain Assessment   Pain Assessment No/denies pain   Cognitive-Linquistic Treatment   Treatment focused on Cognition   Skilled Treatment Faciliated divided attention with moderately complex deduction puzzle and simple conversation with extended time and occasional min verbal cues for deduction puzzle (80% accuracy). Pt divided attention with conversation with occasional min verbal cues. Mildly complex organization/reasoning  with extended time and occasional mod A - math "what's my rule"   Assessment /  Recommendations / Plan   Plan Continue with current plan of care   Progression Toward Goals   Progression toward goals Progressing toward goals            SLP Short Term Goals - 10/18/15 1515    SLP SHORT TERM GOAL #1   Title Pt will utlize external aids for recall of scedule, daily tasks and events over 3 sessions.    Baseline 10/09/15, 10/16/15, 10/18/15   Time 1   Period Weeks   Status Achieved   SLP SHORT TERM GOAL #2   Title Pt will attend to details on moderately complex cognitive linguistic tasks with rare min A   Time 1   Period Weeks   Status Partially Met   SLP SHORT TERM GOAL #3   Title Pt will sustain attention to cognitive linguistic tasks in distracting environment with rare min A   Time 1   Period Weeks   Status Achieved          SLP Long Term Goals - 10/18/15 1515    SLP LONG TERM GOAL #1   Title Pt will divide attention between 2 moderately complex cognitive linguistic tasks with 85% on each and rare min A.   Time 5   Period Weeks   Status On-going   SLP LONG TERM GOAL #2   Title Pt will utilize compenstory strategies to recall new information (paragraphs, messages, etc) 85% accuracy and rare min A  Time 5   Period Weeks   Status On-going          Plan - 10/18/15 1514    Clinical Impression Statement Continue skilled ST to maximize attention/reasoning for possible return to work   Speech Therapy Frequency 2x / week   Treatment/Interventions Cognitive reorganization;SLP instruction and feedback;Internal/external aids;Compensatory strategies;Patient/family education;Functional tasks;Cueing hierarchy   Potential to Achieve Goals Good   Potential Considerations Ability to learn/carryover information   Consulted and Agree with Plan of Care Patient      Patient will benefit from skilled therapeutic intervention in order to improve the following deficits and impairments:   Cognitive communication deficit    Problem List Patient Active Problem  List   Diagnosis Date Noted  . 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 Falls   . Acute deep vein thrombosis (DVT) of left femoral vein ([place]) 07/01/2015  . Pain   . Poor fluid intake   . Essential hypertension   . Type 2 diabetes mellitus with complication, without long-term current use of insulin ([place])   . Disorientation   . Convulsions ([place])   . Acute blood loss anemia   . Hypokalemia   . Ileus ([place])   . Leukocytosis   . Right knee pain   . Cognitive safety issue   . Impulsiveness   . PVC (premature ventricular contraction)   . Abdominal discomfort   . SAH (subarachnoid hemorrhage) ([place]) 06/11/2015  . Emesis   . Subarachnoid hemorrhage ([place]) 06/10/2015  . Hypertension   . Diabetes mellitus without complication ([place])     Lovvorn, Annye Rusk MS, CCC-SLP 10/18/2015, 3:16 PM  [facility] [address] [place], [place], [address] Phone: [phone]   Fax:  [phone]   Name: Darryl [name] MRN: [id number] Date of Birth: [birth year]

## 2015-10-18 NOTE — Patient Instructions (Signed)
Diplopia HEP:  Perform at least 2-3 times per day. Stop if your eye becomes fatigued or hurts and try again later.  1. Hold a small object/card in front of you.  Hold it in the middle at arm's length away.    2. Cover your LEFT eye and look at the object with your RIGHT eye.  3. Slowly move the object side to side in front of you while continuing to watch it with your RIGHT eye.  4.  Remember to keep your head still and only move your eye.  5.  Repeat 5-10 times.  6.  Then, move object up and down while watching it 5-10 times.  7. Cover your RIGHT eye and look at the object with your LEFT eye while you repeat #1-6 above.  8.  Now, uncover both eyes and try to focus on the object while holding it in the middle.  Try to make it 1 image.   9.  If you can, try to hold it for 10-30 sec increasing as able.    10.  Once you can make the image 1 for at least 30 sec in the middle, repeat #1-6 above with both eyes moving slowly and only in the range that you can keep the image 1.

## 2015-10-18 NOTE — Therapy (Signed)
[facility] [address] [place], [place], [address] Phone: [phone]   Fax:  [phone]  Occupational Therapy Treatment  Patient Details  Name: Darryl [name] MRN: [id number] Date of Birth: [birth year] Referring Provider: Dr. Alger Simons  Encounter Date: 10/18/2015      OT End of Session - 10/18/15 1112    Visit Number 6   Number of Visits 13   Date for OT Re-Evaluation 11/09/15   Authorization Type BCBS, no visit limit, no auth needed   OT Start Time 1102   OT Stop Time 1145   OT Time Calculation (min) 43 min   Activity Tolerance Patient tolerated treatment well   Behavior During Therapy [facility] for tasks assessed/performed      Past Medical History  Diagnosis Date  . Hypertension   . Seizures ([place])   . Diabetes mellitus without complication ([place])   . Acute deep vein thrombosis (DVT) of left femoral vein ([place]) 07/01/2015    Past Surgical History  Procedure Laterality Date  . Knee surgery    . Cholecystectomy    . Radiology with anesthesia N/A 06/11/2015    Procedure: RADIOLOGY WITH ANESTHESIA;  Surgeon: [name], MD;  Location: [facility];  Service: Radiology;  Laterality: N/A;    There were no vitals filed for this visit.      Subjective Assessment - 10/18/15 1106    Subjective  Pt reports tremors are less.  significant tremors only with fatigue or first thing in the am.  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 No/denies                      OT Treatments/Exercises (OP) - 10/18/15 0001    ADLs   Writing Writing short paragraph with approx 90% legibility.  Pt demo improved spacing/legibility, but fatigues  quickly.  No significant tremors noted   Fine Motor Coordination   Fine Motor Coordination O'Connor pegs;Grooved pegs   O'Connor pegs placing pegs in with tweezers with min-mod difficulty wih R hand for coordination/strength in prep for writing   Grooved pegs with min difficulty with in-hand manipulation R hand.   Other Fine Motor Exercises Removing small pegs from green putty with R hand for incr strength and then placed in pegboard for incr coordination.   Functional Reaching Activities   High Level mid-high range reaching to place/remove clothespins with 1-8lb resistance on vertical pole for incr strength and activity tolerance.                OT Education - 10/18/15 1133    Education Details Visual HEP for diplopia   Person(s) Educated Patient   Methods Explanation;Verbal cues;Handout;Demonstration   Comprehension Verbalized understanding;Returned demonstration;Verbal cues required;Need further instruction  min-mod cues to slow down          OT Short Term Goals - 09/27/15 2030    OT SHORT TERM GOAL #1   Title Pt will verbalize understanding of adaptive/compensation strategies for ADLs (including compensation strategies for writing and tremors).--check STGs 10/27/15   Time 4   Period Weeks   Status New   OT SHORT TERM GOAL #2   Title Pt  will perform a variety of tabletop scanning with >95% accuracy for IADLs.   Time 4   Period Weeks   Status New   OT SHORT TERM GOAL #3   Title Pt will improve R hand coordination for ADLs as shown by improving time on 9-hole peg test by at least 5sec.   Baseline 35.41sec   Time 4   Period Weeks   Status New           OT Long Term Goals - 10/16/15 1200    OT LONG TERM GOAL #1   Title Pt will be independent with HEP for coordination, R shoulder, and L hand strengthening.--check LTGs 11/09/15   Time 6   Period Weeks   Status New   OT LONG TERM GOAL #2   Title Pt will perform environmental scanning with at least 95% accuracy in  busy environment for improved safety for ADLs.   Time 6   Period Weeks   Status New   OT LONG TERM GOAL #3   Title Pt will write at least 5 sentences with good legibility and good letter/word spacing.   Time 6   Period Weeks   Status New   OT LONG TERM GOAL #4   Title Pt will demo at least 40lbs L grip strength for ADLs.- upgrade goal to pt will demonstrate 50 lbs grip strength for LUE for increased ease with ADLS   Baseline 23FTD   Time 6   Period Weeks   Status Revised   goal met-45 lbs               Plan - 10/18/15 1134    Clinical Impression Statement Pt is progressing towards goals with improved coordination and writing.     Rehab Potential Good   Clinical Impairments Affecting Rehab Potential Memory deficits   OT Frequency 2x / week   OT Duration 6 weeks   OT Treatment/Interventions Self-care/ADL training;DME and/or AE instruction;Energy conservation;Therapist, nutritional;Therapeutic exercises;Patient/family education;Visual/perceptual remediation/compensation;Cognitive remediation/compensation;Therapeutic exercise;Moist Heat;Cryotherapy;Manual Therapy;Passive range of motion;Therapeutic activities;Balance training;Neuromuscular education   Plan **Check STGs; review visual HEP, coordination, writing   OT Home Exercise Plan coordination HEP and suggestions for reducing tremors issued 10/04/15, theraband, scapular stability ex, red putty; 10/18/15  visual HEP for diplopia   Consulted and Agree with Plan of Care Patient      Patient will benefit from skilled therapeutic intervention in order to improve the following deficits and impairments:  Decreased coordination, Impaired vision/preception, Decreased balance, Decreased cognition, Decreased knowledge of precautions, Decreased safety awareness, Impaired UE functional use, Decreased strength, Decreased knowledge of use of DME  Visit Diagnosis: Other lack of coordination  Muscle weakness (generalized)  Visuospatial  deficit    Problem List Patient Active Problem List   Diagnosis Date Noted  . 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 Falls   . Acute deep vein thrombosis (DVT) of left femoral vein ([place]) 07/01/2015  . Pain   . Poor fluid intake   . Essential hypertension   . Type 2 diabetes mellitus with complication, without long-term current use of insulin ([place])   . Disorientation   . Convulsions ([place])   . Acute blood loss anemia   . Hypokalemia   . Ileus ([place])   . Leukocytosis   . Right knee pain   . Cognitive safety issue   . Impulsiveness   . PVC (premature ventricular contraction)   . Abdominal discomfort   . SAH (subarachnoid hemorrhage) ([place]) 06/11/2015  .  Emesis   . Subarachnoid hemorrhage ([place]) 06/10/2015  . Hypertension   . Diabetes mellitus without complication ([place])     [facility] 10/18/2015, 11:45 AM  [facility] [address] [place], [place], [address] Phone: [phone]   Fax:  [phone]  Name: Kristof Nadeem MRN: [id number] Date of Birth: [birth year]  Vianne Bulls, OTR/L [facility] [address] [place], [place]  [address] [phone] phone [phone] 10/18/2015 11:46 AM

## 2015-10-19 NOTE — Therapy (Signed)
[facility] [address] [place], [place], [address] Phone: [phone]   Fax:  [phone]  Physical Therapy Treatment  Patient Details  Name: Darryl [name] MRN: [id number] Date of Birth: January 05, 1951 Referring Provider: Alger Simons, MD  Encounter Date: 10/18/2015      PT End of Session - 10/18/15 1453    Visit Number 11   Number of Visits 17   Date for PT Re-Evaluation 12/03/15   Authorization Type BCBS; no visit limit, no auth required   PT Start Time 3016   PT Stop Time 0109   PT Time Calculation (min) 43 min   Activity Tolerance Patient tolerated treatment well;No increased pain   Behavior During Therapy [facility] for tasks assessed/performed      Past Medical History  Diagnosis Date  . Hypertension   . Seizures ([place])   . Diabetes mellitus without complication ([place])   . Acute deep vein thrombosis (DVT) of left femoral vein ([place]) 07/01/2015    Past Surgical History  Procedure Laterality Date  . Knee surgery    . Cholecystectomy    . Radiology with anesthesia N/A 06/11/2015    Procedure: RADIOLOGY WITH ANESTHESIA;  Surgeon: [name], MD;  Location: [facility];  Service: Radiology;  Laterality: N/A;    There were no vitals filed for this visit.      Subjective Assessment - 10/18/15 1452    Subjective No new complaints. No falls or pain to report.    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    Patient Stated Goals "I want to get stronger in my right leg; to get rid of the cane; and to work on balance."   Currently in Pain? No/denies   Pain Score 0-No pain           [facility] Adult PT Treatment/Exercise - 10/18/15 1458    Ambulation/Gait   Ambulation/Gait Yes   Knee/Hip Exercises: Supine   Bridges Limitations 5 sec holds with single leg bridges, cues on hold times.   Single  Leg Bridge AROM;Strengthening;Right;1 set;10 reps;Limitations   Other Supine Knee/Hip Exercises supine bent knee fallout: 10 sec holds x 10 reps with cues on technique and hold times intitially, none needed toward end of reps   Other Supine Knee/Hip Exercises Supine: R hip internal rotation self-stretch 2 x45-sec holds at beginning of session.     Knee/Hip Exercises: Sidelying   Clams x 10 reps on each side with cues on form (to keep hip forward)              Balance Exercises - 10/18/15 1509    Balance Exercises: Standing   Rockerboard Anterior/posterior;Lateral;Head turns;EO;EC;10 reps;Other time (comment)   Gait with Head Turns Forward;4 reps;Limitations   Other Standing Exercises along a 50 foot hallway: gait with head movements up<>down and left<>right x 4 laps forward each, min guard assist. veering noted, minor increase in dizziness reported. Dizziness decreased with seated rest break.                            Balance Exercises: Standing   Rebounder Limitations on rocker board both ways in parallel bars:EO rocking board with emphasis on tall posture,  EC for 15 sec's x 3 reps, EC head movements up<>down, left<>right  x 10 each, min guard to min assist for balance. no increase  in dizziness reported.                                       PT Short Term Goals - 10/04/15 2040    PT SHORT TERM GOAL #1   Title Pt will perform initial home exercise program with mod I using paper handout to indicate safe HEP compliance. (Target date: 10/04/15)   Baseline Pt reports compliance with walking program and demonstrates x1 viewing effectively.   Status Achieved   PT SHORT TERM GOAL #2   Title Pt will increase self-selected gait speed from 1.66 ft/sec > 1.8 ft/sec to indicate decreased risk of recurrent falls.    Baseline 5/2: gait velocity = 3.18 ft/sec   Status Achieved   PT SHORT TERM GOAL #3   Title Pt will increase Berg from 31/56 to 35/56 to indicate improved standing balance.     Baseline 5/2: Berg score = 54/56.   Status Achieved   PT SHORT TERM GOAL #4   Title Pt will ambulate 200' over level, indoor surfaces with mod I using LRAD to indicate increased safety with household mobility.     Status Achieved   PT SHORT TERM GOAL #5   Title Pt will negotiate 14 stairs with 2 rails with mod I to enable progress toward safety/independence accessing second floor of home.    Baseline Met 5/2.   Status Achieved   PT SHORT TERM GOAL #6   Title Pt will negotiate 3 stairs without rails with mod I using LRAD to enable pt to safely access second floor of home.    Baseline Met 5/4.   Status Achieved   PT SHORT TERM GOAL #7   Title Pt will negotiate 6 stairs with 1 rail with mod I to enable pt to safely use primary home entrance.   Of 14 stairs to access second floor, 3 stairs have no rails.    Baseline Met 5/2.   Status Achieved   PT SHORT TERM GOAL #8   Title Pt will perform floor transfer with min A using standard chair or mat table to progress toward safety with fall recovery.   Baseline Met 5/2.   Status Achieved           PT Long Term Goals - 10/04/15 2041    PT LONG TERM GOAL #1   Title Pt will increase self-selected gait speed from 1.66 ft/sec to > / = 2.26 ft/sec to demonstrate increased efficiency of ambulation.   (Target date: 11/01/15)   Baseline 5/2: gait velocity = 3.18 ft/sec   Status Achieved   PT LONG TERM GOAL #2   Title Pt will improve Berg score from 31/56 to 39/56 to indicate significant improvement in functional standing balance.    Baseline 5/2: [name] = 54/56   Status Achieved   PT LONG TERM GOAL #3   Title Pt will independently ambulate 1,000' over unlevel, paved surfaces with no rest breaks to indicate improved endurance and increased safety with community mobility.   Baseline 5/2: REVISED from mod I to independent without AD due to pt progress.   Status Revised   PT LONG TERM GOAL #4   Title Pt will independently negotiate standard ramp and  curb step to indicate increased safety traversing community obstacles.    Baseline 5/2: REVISED from mod I to independent without AD due to pt progress.   Status Revised  PT LONG TERM GOAL #5   Title Pt will perform floor transfer with mod I using standard chair or mat table to indicate increased safety with fall recovery.   Baseline Met 5/2.   Status Achieved   Additional Long Term Goals   Additional Long Term Goals Yes   PT LONG TERM GOAL #6   Title Pt will perform all above aspects of functional mobility with no more than 2-point increase in pain rating to indicate decreased impact of pain on functional activity tolerance.   Status On-going   PT LONG TERM GOAL #7   Title Pt will improve FGA score from 11/20 to >/= 19/20 to indicate decreased fall risk.   (Target date: 11/01/15)   Status New   PT LONG TERM GOAL #8   Title Pt will demonstrate use of strategy to mitigate dizziness without cueing from PT to demonstrate pt independence managing symptoms.  (Target date: 11/01/15)   Status New            Plan - 10/18/15 1453    Clinical Impression Statement Today's session revisited HEP issued at previous session for LE strengthening as pt continues to have issues and questions. Pt able to perform in session after instruction. Remainder of session addressed high level balance activities with no issues reported. Pt is making steady progress toward goals .                                            Rehab Potential Good   Clinical Impairments Affecting Rehab Potential positive: age, supportive wife; negative: decreased awareness, chronic R knee pain   PT Frequency 2x / week   PT Duration 8 weeks   PT Treatment/Interventions ADLs/Self Care Home Management;Vestibular;DME Instruction;Gait training;Stair training;Functional mobility training;Therapeutic activities;Therapeutic exercise;Balance training;Orthotic Fit/Training;Patient/family education;Neuromuscular re-education;Visual/perceptual  remediation/compensation   PT Next Visit Plan Continue stretching/strengthening R hip, dynamic gait, high level balance.   Consulted and Agree with Plan of Care Patient      Patient will benefit from skilled therapeutic intervention in order to improve the following deficits and impairments:  Postural dysfunction, Abnormal gait, Decreased activity tolerance, Decreased balance, Decreased endurance, Decreased coordination, Dizziness, Decreased knowledge of use of DME, Pain, Decreased strength, Decreased safety awareness, Impaired perceived functional ability, Impaired vision/preception  Visit Diagnosis: Muscle weakness (generalized)  Other lack of coordination     Problem List Patient Active Problem List   Diagnosis Date Noted  . 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 Falls   . Acute deep vein thrombosis (DVT) of left femoral vein ([place]) 07/01/2015  . Pain   . Poor fluid intake   . Essential hypertension   . Type 2 diabetes mellitus with complication, without long-term current use of insulin ([name])   . Disorientation   . Convulsions ([place])   . Acute blood loss anemia   . Hypokalemia   . Ileus ([place])   . Leukocytosis   . Right knee pain   . Cognitive safety issue   . Impulsiveness   . PVC (premature ventricular contraction)   . Abdominal discomfort   . SAH (subarachnoid hemorrhage) ([place]) 06/11/2015  . Emesis   . Subarachnoid hemorrhage ([place]) 06/10/2015  . Hypertension   . Diabetes mellitus without complication ([place])     Willow Ora, PTA, [facility] [address] [place], [place] [address] [phone]  10/19/2015, 7:55 PM   Name: Sergio Zawislak MRN: [id number] Date of Birth: September 11, 1950

## 2015-10-21 ENCOUNTER — Other Ambulatory Visit: Payer: Self-pay | Admitting: Physical Medicine and Rehabilitation

## 2015-10-23 ENCOUNTER — Ambulatory Visit: Payer: BC Managed Care – PPO | Admitting: Occupational Therapy

## 2015-10-23 ENCOUNTER — Ambulatory Visit: Payer: BC Managed Care – PPO | Admitting: Physical Therapy

## 2015-10-23 ENCOUNTER — Ambulatory Visit: Payer: BC Managed Care – PPO | Admitting: Speech Pathology

## 2015-10-23 DIAGNOSIS — R41841 Cognitive communication deficit: Secondary | ICD-10-CM

## 2015-10-23 DIAGNOSIS — R2681 Unsteadiness on feet: Secondary | ICD-10-CM | POA: Diagnosis not present

## 2015-10-23 DIAGNOSIS — R278 Other lack of coordination: Secondary | ICD-10-CM

## 2015-10-23 DIAGNOSIS — M6281 Muscle weakness (generalized): Secondary | ICD-10-CM

## 2015-10-23 DIAGNOSIS — R2689 Other abnormalities of gait and mobility: Secondary | ICD-10-CM

## 2015-10-23 DIAGNOSIS — R41842 Visuospatial deficit: Secondary | ICD-10-CM

## 2015-10-23 NOTE — Therapy (Signed)
[facility] [address] [place], [place], [address] Phone: [phone]   Fax:  [phone]  Physical Therapy Treatment  Patient Details  Name: Darryl [name] MRN: [id number] Date of Birth: [birth year] Referring Provider: Alger Simons, MD  Encounter Date: 10/23/2015      PT End of Session - 10/23/15 1852    Visit Number 12   Number of Visits 17   Date for PT Re-Evaluation 12/03/15   Authorization Type BCBS; no visit limit, no auth required   PT Start Time 1451   PT Stop Time 1530   PT Time Calculation (min) 39 min   Equipment Utilized During Treatment Gait belt   Activity Tolerance Patient tolerated treatment well;No increased pain   Behavior During Therapy [facility] for tasks assessed/performed      Past Medical History  Diagnosis Date  . Hypertension   . Seizures ([place])   . Diabetes mellitus without complication ([place])   . Acute deep vein thrombosis (DVT) of left femoral vein ([place]) 07/01/2015    Past Surgical History  Procedure Laterality Date  . Knee surgery    . Cholecystectomy    . Radiology with anesthesia N/A 06/11/2015    Procedure: RADIOLOGY WITH ANESTHESIA;  Surgeon: [name], MD;  Location: [facility];  Service: Radiology;  Laterality: N/A;    There were no vitals filed for this visit.      Subjective Assessment - 10/23/15 1455    Subjective Pt reports no falls, no significant changes. No pain at this time, but pt states, "The knee is significantly better, but my (right) ankle slaps on the ground when I get tired. 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    Patient Stated Goals "I want to get stronger in my right leg; to get rid of the cane; and to work on balance."   Currently in Pain? No/denies   Pain Descriptors / Indicators  Penetrating            [facility] PT Assessment - 10/23/15 0001    Functional Gait  Assessment   Gait assessed  Yes   Gait Level Surface Walks 20 ft in less than 7 sec but greater than 5.5 sec, uses assistive device, slower speed, mild gait deviations, or deviates 6-10 in outside of the 12 in walkway width.  6.26 sec   Change in Gait Speed Able to change speed, demonstrates mild gait deviations, deviates 6-10 in outside of the 12 in walkway width, or no gait deviations, unable to achieve a major change in velocity, or uses a change in velocity, or uses an assistive device.   Gait with Horizontal Head Turns Performs head turns smoothly with slight change in gait velocity (eg, minor disruption to smooth gait path), deviates 6-10 in outside 12 in walkway width, or uses an assistive device.   Gait with Vertical Head Turns Performs head turns with no change in gait. Deviates no more than 6 in outside 12 in walkway width.   Gait and Pivot Turn Pivot turns safely in greater than 3 sec and stops with no loss of balance, or pivot turns safely within 3 sec and stops with mild imbalance, requires small steps to catch balance.   Step Over Obstacle Is able to step over one shoe box (4.5 in total height) without changing gait speed.  No evidence of imbalance.   Gait with Narrow Base of Support Ambulates 7-9 steps.  9 steps   Gait with Eyes Closed Walks 20 ft, uses assistive device, slower speed, mild gait deviations, deviates 6-10 in outside 12 in walkway width. Ambulates 20 ft in less than 9 sec but greater than 7 sec.  7.9 sec   Ambulating Backwards Walks 20 ft, uses assistive device, slower speed, mild gait deviations, deviates 6-10 in outside 12 in walkway width.   Steps Alternating feet, no rail.   Total Score 22                     [facility] Adult PT Treatment/Exercise - 10/23/15 0001    Exercises   Exercises Ankle   Ankle Exercises: Stretches   Gastroc Stretch 3 reps;60 seconds;2 reps    Gastroc Stretch Limitations standing; x2 on L, x3 on R   Other Stretch Seated manual self-stretch of R peroneal muscles 2 x45-sec holds.   Other Stretch Standing ankle PF self-stretch with tibial internal rotation x45-sec hold, with external rotation x45-sec hold.   Ankle Exercises: Standing   Toe Raise 15 reps;1 second   Toe Raise Limitations with BUE support; R ankle DF AROM limited   Ankle Exercises: Seated   Other Seated Ankle Exercises Tband-resisted R ankle DF x10 reps, x5 reps (to fatigue) with emphasis on eccentric DF.                PT Education - 10/23/15 1531    Education provided Yes   Education Details FGA score, progress, and functional implications. HEP: may stop doing dynamic gait HEP. Added gastrocnemius stretch.   Person(s) Educated Patient   Methods Explanation;Demonstration;Verbal cues;Handout   Comprehension Verbalized understanding;Returned demonstration          PT Short Term Goals - 10/04/15 2040    PT SHORT TERM GOAL #1   Title Pt will perform initial home exercise program with mod I using paper handout to indicate safe HEP compliance. (Target date: 10/04/15)   Baseline Pt reports compliance with walking program and demonstrates x1 viewing effectively.   Status Achieved   PT SHORT TERM GOAL #2   Title Pt will increase self-selected gait speed from 1.66 ft/sec > 1.8 ft/sec to indicate decreased risk of recurrent falls.    Baseline 5/2: gait velocity = 3.18 ft/sec   Status Achieved   PT SHORT TERM GOAL #3   Title Pt will increase Berg from 31/56 to 35/56 to indicate improved standing balance.    Baseline 5/2: Berg score = 54/56.   Status Achieved   PT SHORT TERM GOAL #4   Title Pt will ambulate 200' over level, indoor surfaces with mod I using LRAD to indicate increased safety with household mobility.     Status Achieved   PT SHORT TERM GOAL #5   Title Pt will negotiate 14 stairs with 2 rails with mod I to enable progress toward safety/independence  accessing second floor of home.    Baseline Met 5/2.   Status Achieved   PT SHORT TERM GOAL #6   Title Pt will negotiate 3 stairs without rails with mod I using LRAD to enable pt to safely access second floor of home.    Baseline Met 5/4.   Status Achieved   PT SHORT TERM GOAL #7   Title Pt will negotiate 6 stairs with 1 rail with mod I to enable pt to safely use primary home entrance.   Of 14  stairs to access second floor, 3 stairs have no rails.    Baseline Met 5/2.   Status Achieved   PT SHORT TERM GOAL #8   Title Pt will perform floor transfer with min A using standard chair or mat table to progress toward safety with fall recovery.   Baseline Met 5/2.   Status Achieved           PT Long Term Goals - 10/23/15 1506    PT LONG TERM GOAL #1   Title Pt will increase self-selected gait speed from 1.66 ft/sec to > / = 2.26 ft/sec to demonstrate increased efficiency of ambulation.   (Target date: 11/01/15)   Baseline 5/2: gait velocity = 3.18 ft/sec   Status Achieved   PT LONG TERM GOAL #2   Title Pt will improve Berg score from 31/56 to 39/56 to indicate significant improvement in functional standing balance.    Baseline 5/2: [name] = 54/56   Status Achieved   PT LONG TERM GOAL #3   Title Pt will independently ambulate 1,000' over unlevel, paved surfaces with no rest breaks to indicate improved endurance and increased safety with community mobility.   Baseline 5/2: REVISED from mod I to independent without AD due to pt progress.   Status On-going   PT LONG TERM GOAL #4   Title Pt will independently negotiate standard ramp and curb step to indicate increased safety traversing community obstacles.    Baseline 5/2: REVISED from mod I to independent without AD due to pt progress.   Status On-going   PT LONG TERM GOAL #5   Title Pt will perform floor transfer with mod I using standard chair or mat table to indicate increased safety with fall recovery.   Baseline Met 5/2.   Status  Achieved   PT LONG TERM GOAL #6   Title Pt will perform all above aspects of functional mobility with no more than 2-point increase in pain rating to indicate decreased impact of pain on functional activity tolerance.   Status On-going   PT LONG TERM GOAL #7   Title Pt will improve FGA score from 11/20 to >/= 19/20 to indicate decreased fall risk.   (Target date: 11/01/15)   Baseline 5/23: FGA = 22/30   Status Achieved   PT LONG TERM GOAL #8   Title Pt will demonstrate use of strategy to mitigate dizziness without cueing from PT to demonstrate pt independence managing symptoms.  (Target date: 11/01/15)   Baseline Met 5/23.   Status Achieved               Plan - 10/23/15 1528    Clinical Impression Statement LTG for FGA met, as score improved from 11/30 to 22/30 since last assessed. Remainder of session focused on addressing R foot slap during gait, which occurs with increased fatigue; and addressing intermittent R calf soreness.   Rehab Potential Good   Clinical Impairments Affecting Rehab Potential positive: age, supportive wife; negative: decreased awareness, chronic R knee pain   PT Frequency 2x / week   PT Duration 8 weeks   PT Treatment/Interventions ADLs/Self Care Home Management;Vestibular;DME Instruction;Gait training;Stair training;Functional mobility training;Therapeutic activities;Therapeutic exercise;Balance training;Orthotic Fit/Training;Patient/family education;Neuromuscular re-education;Visual/perceptual remediation/compensation   PT Next Visit Plan Focus on community, dynamic gait without SPC. Continue LE strengthening and standing balance.   Consulted and Agree with Plan of Care Patient      Patient will benefit from skilled therapeutic intervention in order to improve the following deficits and impairments:  Postural  dysfunction, Abnormal gait, Decreased activity tolerance, Decreased balance, Decreased endurance, Decreased coordination, Dizziness, Decreased knowledge  of use of DME, Pain, Decreased strength, Decreased safety awareness, Impaired perceived functional ability, Impaired vision/preception  Visit Diagnosis: Other abnormalities of gait and mobility  Muscle weakness (generalized)     Problem List Patient Active Problem List   Diagnosis Date Noted  . 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 Falls   . Acute deep vein thrombosis (DVT) of left femoral vein ([place]) 07/01/2015  . Pain   . Poor fluid intake   . Essential hypertension   . Type 2 diabetes mellitus with complication, without long-term current use of insulin ([place])   . Disorientation   . Convulsions ([place])   . Acute blood loss anemia   . Hypokalemia   . Ileus ([place])   . Leukocytosis   . Right knee pain   . Cognitive safety issue   . Impulsiveness   . PVC (premature ventricular contraction)   . Abdominal discomfort   . SAH (subarachnoid hemorrhage) ([place]) 06/11/2015  . Emesis   . Subarachnoid hemorrhage ([place]) 06/10/2015  . Hypertension   . Diabetes mellitus without complication ([place])     Billie Ruddy, PT, DPT [facility] [address] [place] [place], [place], [address] Phone: [phone]   Fax:  [phone] 10/23/2015, 6:55 PM  Name: Cordarrius Coad MRN: [id number] Date of Birth: Feb 17, 1951

## 2015-10-23 NOTE — Patient Instructions (Addendum)
Gastroc Stretch    Stand with right foot back, leg straight, forward leg bent. Keeping heel on floor, turned slightly out, lean into wall until stretch is felt in calf. Hold 45-50 seconds. Perform 2-3 times per day on both legs (especially the RIGHT).

## 2015-10-23 NOTE — Therapy (Signed)
[facility] [address] [place], [place], [address] Phone: [phone]   Fax:  [phone]  Occupational Therapy Treatment  Patient Details  Name: Darryl [name] MRN: [id number] Date of Birth: [birth year] Referring Provider: Dr. Alger Simons  Encounter Date: 10/23/2015      OT End of Session - 10/23/15 1114    Visit Number 7   Number of Visits 13   Date for OT Re-Evaluation 11/09/15   Authorization Type BCBS, no visit limit, no auth needed   OT Start Time 1106   OT Stop Time 1145   OT Time Calculation (min) 39 min      Past Medical History  Diagnosis Date  . Hypertension   . Seizures ([place])   . Diabetes mellitus without complication ([place])   . Acute deep vein thrombosis (DVT) of left femoral vein ([place]) 07/01/2015    Past Surgical History  Procedure Laterality Date  . Knee surgery    . Cholecystectomy    . Radiology with anesthesia N/A 06/11/2015    Procedure: RADIOLOGY WITH ANESTHESIA;  Surgeon: [name], MD;  Location: [facility];  Service: Radiology;  Laterality: N/A;    There were no vitals filed for this visit.      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 No/denies      Therapist checked progress towards short term goals see below. Tabletop scanning 35M number cancellation task with only 1 omission Reviewed vision HEP, pt returned demonstration. Copying small peg design on vertical surface with RUE for increased RUE fine motor coordination and visual perceptual skills,  With 100% accuracy.                           OT Short Term Goals - 10/23/15 1106    OT SHORT TERM GOAL #1   Title Pt will verbalize  understanding of adaptive/compensation strategies for ADLs (including compensation strategies for writing and tremors).--check STGs 10/27/15   Time 4   Period Weeks   Status Achieved   OT SHORT TERM GOAL #2   Title Pt will perform a variety of tabletop scanning with >95% accuracy for IADLs.   Time 4   Period Weeks   Status On-going   OT SHORT TERM GOAL #3   Title Pt will improve R hand coordination for ADLs as shown by improving time on 9-hole peg test by at least 5sec.   Baseline 35.41sec   Time 4   Period Weeks   Status Achieved  26.72           OT Long Term Goals - 10/16/15 1200    OT LONG TERM GOAL #1   Title Pt will be independent with HEP for coordination, R shoulder, and L hand strengthening.--check LTGs 11/09/15   Time 6   Period Weeks   Status New   OT LONG TERM GOAL #2   Title Pt will perform environmental scanning with at least 95% accuracy in busy environment for improved safety for ADLs.   Time 6   Period Weeks   Status New   OT LONG TERM GOAL #3   Title Pt will write at least 5  sentences with good legibility and good letter/word spacing.   Time 6   Period Weeks   Status New   OT LONG TERM GOAL #4   Title Pt will demo at least 40lbs L grip strength for ADLs.- upgrade goal to pt will demonstrate 50 lbs grip strength for LUE for increased ease with ADLS   Baseline [id number]   Time 6   Period Weeks   Status Revised   goal met-45 lbs               Plan - 10/23/15 1115    Clinical Impression Statement Pt demonstratees good progress towards short term goals.   Rehab Potential Good   Clinical Impairments Affecting Rehab Potential Memory deficits   OT Frequency 2x / week   OT Duration 6 weeks   OT Treatment/Interventions Self-care/ADL training;DME and/or AE instruction;Energy conservation;Therapist, nutritional;Therapeutic exercises;Patient/family education;Visual/perceptual remediation/compensation;Cognitive remediation/compensation;Therapeutic  exercise;Moist Heat;Cryotherapy;Manual Therapy;Passive range of motion;Therapeutic activities;Balance training;Neuromuscular education   Plan coordination activities, visual perceptual tasks   OT Home Exercise Plan coordination HEP and suggestions for reducing tremors issued 10/04/15, theraband, scapular stability ex, red putty; 10/18/15  visual HEP for diplopia      Patient will benefit from skilled therapeutic intervention in order to improve the following deficits and impairments:  Decreased coordination, Impaired vision/preception, Decreased balance, Decreased cognition, Decreased knowledge of precautions, Decreased safety awareness, Impaired UE functional use, Decreased strength, Decreased knowledge of use of DME  Visit Diagnosis: Other lack of coordination  Visuospatial deficit  Muscle weakness (generalized)    Problem List Patient Active Problem List   Diagnosis Date Noted  . 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 Falls   . Acute deep vein thrombosis (DVT) of left femoral vein ([place]) 07/01/2015  . Pain   . Poor fluid intake   . Essential hypertension   . Type 2 diabetes mellitus with complication, without long-term current use of insulin ([place])   . Disorientation   . Convulsions ([place])   . Acute blood loss anemia   . Hypokalemia   . Ileus ([place])   . Leukocytosis   . Right knee pain   . Cognitive safety issue   . Impulsiveness   . PVC (premature ventricular contraction)   . Abdominal discomfort   . SAH (subarachnoid hemorrhage) ([place]) 06/11/2015  . Emesis   . Subarachnoid hemorrhage ([place]) 06/10/2015  . Hypertension   . Diabetes mellitus without complication ([place])     RINE,KATHRYN 10/24/2015, 6:03 PM Theone Murdoch, OTR/L [phone] Phone: [phone] 6:03 PM 10/24/2015 [facility] [address] [place], [place], [address] Phone: [phone]    Fax:  [phone]  Name: Darryl [name] MRN: [id number] Date of Birth: [birth year]

## 2015-10-23 NOTE — Therapy (Signed)
[facility] [address] [place], [place], [address] Phone: [phone]   Fax:  [phone]  Speech Language Pathology Treatment  Patient Details  Name: Darryl [name] MRN: [id number] Date of Birth: 09-14-1950 Referring Provider: Dr. Alger Simons  Encounter Date: 10/23/2015      End of Session - 10/23/15 1459    Visit Number 10   Number of Visits 17   Date for SLP Re-Evaluation 11/01/15   Authorization Type BCBS   SLP Start Time 5329   SLP Stop Time  1450   SLP Time Calculation (min) 40 min      Past Medical History  Diagnosis Date  . Hypertension   . Seizures ([place])   . Diabetes mellitus without complication ([place])   . Acute deep vein thrombosis (DVT) of left femoral vein ([place]) 07/01/2015    Past Surgical History  Procedure Laterality Date  . Knee surgery    . Cholecystectomy    . Radiology with anesthesia N/A 06/11/2015    Procedure: RADIOLOGY WITH ANESTHESIA;  Surgeon: [name], MD;  Location: [facility];  Service: Radiology;  Laterality: N/A;    There were no vitals filed for this visit.      Subjective Assessment - 10/23/15 1419    Subjective "I was about to walk home" ST ran late with prior pt   Currently in Pain? No/denies               ADULT SLP TREATMENT - 10/23/15 1419    General Information   Behavior/Cognition Alert;Cooperative;Pleasant mood   Treatment Provided   Treatment provided Cognitive-Linquistic   Pain Assessment   Pain Assessment No/denies pain   Cognitive-Linquistic Treatment   Treatment focused on Cognition   Skilled Treatment Complex reasoning/attention facilitated with complex math word problem with extended time and supervision cues. Divided attention between mildly complex check book balancing task and auditory task with  100% accuracy on each task and rare request for repeat of auditory information   Assessment / Recommendations / [place] with  current plan of care   Progression Toward Goals   Progression toward goals Progressing toward goals          SLP Education - 10/23/15 1457    Education provided Yes   Education Details progress towards goals, attention compensations for return to work   [employer]) Educated Patient   Methods Explanation   Comprehension Verbalized understanding          SLP Short Term Goals - 10/23/15 1459    SLP SHORT TERM GOAL #1   Title Pt will utlize external aids for recall of scedule, daily tasks and events over 3 sessions.    Baseline 10/09/15, 10/16/15, 10/18/15   Time 1   Period Weeks   Status Achieved   SLP SHORT TERM GOAL #2   Title Pt will attend to details on moderately complex cognitive linguistic tasks with rare min A   Time 1   Period Weeks   Status Partially Met   SLP SHORT TERM GOAL #3   Title Pt will sustain attention to cognitive linguistic tasks in distracting environment with rare min A   Time 1   Period Weeks   Status Achieved          SLP Long Term Goals - 10/23/15 1459    SLP LONG TERM GOAL #1   Title Pt will divide attention between 2 moderately complex cognitive linguistic tasks with 85% on each and rare  min A.   Time 4   Period Weeks   Status On-going   SLP LONG TERM GOAL #2   Title Pt will utilize compenstory strategies to recall new information (paragraphs, messages, etc) 85% accuracy and rare min A   Time 4   Period Weeks   Status On-going          Plan - 10/23/15 1457    Clinical Impression Statement Pt continues to improve divided attention and attention to details with min A. Pt is demonstrating error awareness and anticiaptory awareness of attention impairments with min A. Continue skilled ST to maximize cognition for eventual return to work.    Speech Therapy Frequency 2x / week   Treatment/Interventions Cognitive reorganization;SLP instruction and feedback;Internal/external aids;Compensatory strategies;Patient/family education;Functional  tasks;Cueing hierarchy   Potential to Achieve Goals Good   Consulted and Agree with Plan of Care Patient      Patient will benefit from skilled therapeutic intervention in order to improve the following deficits and impairments:   Cognitive communication deficit    Problem List Patient Active Problem List   Diagnosis Date Noted  . 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 Falls   . Acute deep vein thrombosis (DVT) of left femoral vein ([place]) 07/01/2015  . Pain   . Poor fluid intake   . Essential hypertension   . Type 2 diabetes mellitus with complication, without long-term current use of insulin ([place])   . Disorientation   . Convulsions ([place])   . Acute blood loss anemia   . Hypokalemia   . Ileus ([place])   . Leukocytosis   . Right knee pain   . Cognitive safety issue   . Impulsiveness   . PVC (premature ventricular contraction)   . Abdominal discomfort   . SAH (subarachnoid hemorrhage) ([place]) 06/11/2015  . Emesis   . Subarachnoid hemorrhage ([place]) 06/10/2015  . Hypertension   . Diabetes mellitus without complication ([place])     Allyssa Abruzzese, Annye Rusk MS, CCC-SLP 10/23/2015, 3:00 PM  [facility] [address] [place], [place], [address] Phone: [phone]   Fax:  [phone]   Name: Shrihaan Porzio MRN: [id number] Date of Birth: 28-Jan-1951

## 2015-10-25 ENCOUNTER — Ambulatory Visit: Payer: BC Managed Care – PPO | Admitting: Physical Therapy

## 2015-10-25 ENCOUNTER — Ambulatory Visit: Payer: BC Managed Care – PPO | Admitting: Speech Pathology

## 2015-10-25 ENCOUNTER — Encounter: Payer: Self-pay | Admitting: Physical Therapy

## 2015-10-25 DIAGNOSIS — R278 Other lack of coordination: Secondary | ICD-10-CM

## 2015-10-25 DIAGNOSIS — R42 Dizziness and giddiness: Secondary | ICD-10-CM

## 2015-10-25 DIAGNOSIS — R2689 Other abnormalities of gait and mobility: Secondary | ICD-10-CM

## 2015-10-25 DIAGNOSIS — R2681 Unsteadiness on feet: Secondary | ICD-10-CM

## 2015-10-25 DIAGNOSIS — R41841 Cognitive communication deficit: Secondary | ICD-10-CM

## 2015-10-25 NOTE — Therapy (Signed)
[facility] [address] [place], [place], [address] Phone: [phone]   Fax:  [phone]  Speech Language Pathology Treatment  Patient Details  Name: Darryl [name] MRN: [id number] Date of Birth: May 21, 1951 Referring Provider: Dr. Alger Simons  Encounter Date: 10/25/2015      End of Session - 10/25/15 1500    Visit Number 11   Number of Visits 17   Date for SLP Re-Evaluation 11/01/15   Authorization Type BCBS   SLP Start Time 5449   SLP Stop Time  2010   SLP Time Calculation (min) 44 min      Past Medical History  Diagnosis Date  . Hypertension   . Seizures ([place])   . Diabetes mellitus without complication ([place])   . Acute deep vein thrombosis (DVT) of left femoral vein ([place]) 07/01/2015    Past Surgical History  Procedure Laterality Date  . Knee surgery    . Cholecystectomy    . Radiology with anesthesia N/A 06/11/2015    Procedure: RADIOLOGY WITH ANESTHESIA;  Surgeon: [name], MD;  Location: [facility];  Service: Radiology;  Laterality: N/A;    There were no vitals filed for this visit.      Subjective Assessment - 10/25/15 1419    Subjective "I am having trouble thinking of words"   Currently in Pain? No/denies               ADULT SLP TREATMENT - 10/25/15 1420    General Information   Behavior/Cognition Alert;Cooperative;Pleasant mood   Treatment Provided   Treatment provided Cognitive-Linquistic   Pain Assessment   Pain Assessment No/denies pain   Cognitive-Linquistic Treatment   Treatment focused on Cognition   Skilled Treatment Facilitated divided attention with complex naming tasks while participating in convesation, with extended time for word finding and semantic cues required. Provided homework for word finding as this is pt's complaint today          SLP Education - 10/25/15 1458    Education provided Yes   Education Details compensations for word finding difficulties           SLP Short Term Goals - 10/25/15 1459    SLP SHORT TERM GOAL #1   Title Pt will utlize external aids for recall of scedule, daily tasks and events over 3 sessions.    Baseline 10/09/15, 10/16/15, 10/18/15   Time 1   Period Weeks   Status Achieved   SLP SHORT TERM GOAL #2   Title Pt will attend to details on moderately complex cognitive linguistic tasks with rare min A   Time 1   Period Weeks   Status Partially Met   SLP SHORT TERM GOAL #3   Title Pt will sustain attention to cognitive linguistic tasks in distracting environment with rare min A   Time 1   Period Weeks   Status Achieved          SLP Long Term Goals - 10/25/15 1459    SLP LONG TERM GOAL #1   Title Pt will divide attention between 2 moderately complex cognitive linguistic tasks with 85% on each and rare min A.   Time 4   Period Weeks   Status On-going   SLP LONG TERM GOAL #2   Title Pt will utilize compenstory strategies to recall new information (paragraphs, messages, etc) 85% accuracy and rare min A   Time 4   Period Weeks   Status On-going  Plan - 10/25/15 1458    Clinical Impression Statement Pt continues to improve divided attention and attention to details with min A. Pt is demonstrating error awareness and anticiaptory awareness of attention impairments with min A. Today he expressed concern re: word finding difficulties 3x a day at least. I provided homework to target word finding.  Continue skilled ST to maximize cognition for eventual return to work.    Speech Therapy Frequency 2x / week   Treatment/Interventions Cognitive reorganization;SLP instruction and feedback;Internal/external aids;Compensatory strategies;Patient/family education;Functional tasks;Cueing hierarchy   Potential to Achieve Goals Good   Consulted and Agree with Plan of Care Patient      Patient will benefit from skilled therapeutic intervention in order to improve the following deficits and impairments:    Cognitive communication deficit    Problem List Patient Active Problem List   Diagnosis Date Noted  . 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 Falls   . Acute deep vein thrombosis (DVT) of left femoral vein ([place]) 07/01/2015  . Pain   . Poor fluid intake   . Essential hypertension   . Type 2 diabetes mellitus with complication, without long-term current use of insulin ([place])   . Disorientation   . Convulsions ([place])   . Acute blood loss anemia   . Hypokalemia   . Ileus ([place])   . Leukocytosis   . Right knee pain   . Cognitive safety issue   . Impulsiveness   . PVC (premature ventricular contraction)   . Abdominal discomfort   . SAH (subarachnoid hemorrhage) ([place]) 06/11/2015  . Emesis   . Subarachnoid hemorrhage ([place]) 06/10/2015  . Hypertension   . Diabetes mellitus without complication ([place])     Lovvorn, Annye Rusk MS, CCC-SLP 10/25/2015, 3:01 PM  [facility] [address] [place], [place], [address] Phone: [phone]   Fax:  [phone]   Name: Darryl [name] MRN: [id number] Date of Birth: 26-Oct-1950

## 2015-10-26 NOTE — Therapy (Signed)
[facility] [address] [place], [place], [address] Phone: [phone]   Fax:  [phone]  Physical Therapy Treatment  Patient Details  Name: Darryl [name] MRN: [id number] Date of Birth: [birth year] Referring Provider: Alger Simons, MD  Encounter Date: 10/25/2015      PT End of Session - 10/25/15 1454    Visit Number 13   Number of Visits 17   Date for PT Re-Evaluation 12/03/15   Authorization Type BCBS; no visit limit, no auth required   PT Start Time 1450   PT Stop Time 1530   PT Time Calculation (min) 40 min   Equipment Utilized During Treatment Gait belt   Activity Tolerance Patient tolerated treatment well;No increased pain   Behavior During Therapy Darryl [name] for tasks assessed/performed      Past Medical History  Diagnosis Date  . Hypertension   . Seizures ([place])   . Diabetes mellitus without complication ([place])   . Acute deep vein thrombosis (DVT) of left femoral vein ([place]) 07/01/2015    Past Surgical History  Procedure Laterality Date  . Knee surgery    . Cholecystectomy    . Radiology with anesthesia N/A 06/11/2015    Procedure: RADIOLOGY WITH ANESTHESIA;  Surgeon: Darryl Lose, MD;  Location: [facility];  Service: Radiology;  Laterality: N/A;    There were no vitals filed for this visit.      Subjective Assessment - 10/25/15 1452    Subjective No new falls. Increased pain in right leg/shin after last session that went away the next day.   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    Patient Stated Goals "I want to get stronger in my right leg; to get rid of the cane; and to work on balance."   Currently in Pain? No/denies   Pain Score 0-No pain            [facility] Adult PT Treatment/Exercise - 10/25/15 1456    Ambulation/Gait   Ambulation/Gait Yes   Ambulation/Gait Assistance 5: Supervision   Ambulation/Gait Assistance Details 2 episodes of toe scuffing noted, no balance loss noted. pt dual tasking with converstaion and enviromental scanning   Ambulation Distance (Feet) 1000 Feet   Assistive device None   Gait Pattern Step-through pattern  occasional toe scuffing   Ambulation Surface Level;Outdoor;Unlevel;Indoor;Paved   High Level Balance   High Level Balance Activities Marching forwards;Marching backwards;Tandem walking  toe/tandem walking fwd/bwd,   High Level Balance Comments on red mats x 3 laps each fwd/bwd with min guard to min assist for balance and cues on form/technique. Attemtped heel walking fwd, increased right shin pain after 1-2 steps, so ex stopped.              Balance Exercises - 10/25/15 1518    Balance Exercises: Standing   SLS with Vectors Foam/compliant surface;Other reps (comment);Limitations   Balance Beam standing with feet across blue foam beam: no UE support-  alternating forward heel taps to floor and back onto foam x 10 each side, alternating backward toe taps to floor and back onto foam x 10 reps bil legs.min guard to min assist for balance, cues on weight shifting and to maintain wider base of support for improved balance.  Balance Exercises: Standing   SLS with Vectors Limitations 5 cones down center of red mats: bil toe taps to each cone with side stepping around cone to next cone in line (alternating sides), x 4 laps forward with 180* turns at end x 3 reps, min guard to min assist for balance.                                  PT Short Term Goals - 10/04/15 2040    PT SHORT TERM GOAL #1   Title Pt will perform initial home exercise program with mod I using paper handout to indicate safe HEP compliance. (Target date: 10/04/15)   Baseline Pt reports compliance with walking program and demonstrates x1 viewing effectively.   Status  Achieved   PT SHORT TERM GOAL #2   Title Pt will increase self-selected gait speed from 1.66 ft/sec > 1.8 ft/sec to indicate decreased risk of recurrent falls.    Baseline 5/2: gait velocity = 3.18 ft/sec   Status Achieved   PT SHORT TERM GOAL #3   Title Pt will increase Berg from 31/56 to 35/56 to indicate improved standing balance.    Baseline 5/2: Berg score = 54/56.   Status Achieved   PT SHORT TERM GOAL #4   Title Pt will ambulate 200' over level, indoor surfaces with mod I using LRAD to indicate increased safety with household mobility.     Status Achieved   PT SHORT TERM GOAL #5   Title Pt will negotiate 14 stairs with 2 rails with mod I to enable progress toward safety/independence accessing second floor of home.    Baseline Met 5/2.   Status Achieved   PT SHORT TERM GOAL #6   Title Pt will negotiate 3 stairs without rails with mod I using LRAD to enable pt to safely access second floor of home.    Baseline Met 5/4.   Status Achieved   PT SHORT TERM GOAL #7   Title Pt will negotiate 6 stairs with 1 rail with mod I to enable pt to safely use primary home entrance.   Of 14 stairs to access second floor, 3 stairs have no rails.    Baseline Met 5/2.   Status Achieved   PT SHORT TERM GOAL #8   Title Pt will perform floor transfer with min A using standard chair or mat table to progress toward safety with fall recovery.   Baseline Met 5/2.   Status Achieved           PT Long Term Goals - 10/23/15 1506    PT LONG TERM GOAL #1   Title Pt will increase self-selected gait speed from 1.66 ft/sec to > / = 2.26 ft/sec to demonstrate increased efficiency of ambulation.   (Target date: 11/01/15)   Baseline 5/2: gait velocity = 3.18 ft/sec   Status Achieved   PT LONG TERM GOAL #2   Title Pt will improve Berg score from 31/56 to 39/56 to indicate significant improvement in functional standing balance.    Baseline 5/2: Darryl [name] = 54/56   Status Achieved   PT LONG TERM GOAL #3   Title Pt  will independently ambulate 1,000' over unlevel, paved surfaces with no rest breaks to indicate improved endurance and increased safety with community mobility.   Baseline 5/2: REVISED from mod I to independent without AD due to pt progress.   Status On-going   PT LONG  TERM GOAL #4   Title Pt will independently negotiate standard ramp and curb step to indicate increased safety traversing community obstacles.    Baseline 5/2: REVISED from mod I to independent without AD due to pt progress.   Status On-going   PT LONG TERM GOAL #5   Title Pt will perform floor transfer with mod I using standard chair or mat table to indicate increased safety with fall recovery.   Baseline Met 5/2.   Status Achieved   PT LONG TERM GOAL #6   Title Pt will perform all above aspects of functional mobility with no more than 2-point increase in pain rating to indicate decreased impact of pain on functional activity tolerance.   Status On-going   PT LONG TERM GOAL #7   Title Pt will improve FGA score from 11/20 to >/= 19/20 to indicate decreased fall risk.   (Target date: 11/01/15)   Baseline 5/23: FGA = 22/30   Status Achieved   PT LONG TERM GOAL #8   Title Pt will demonstrate use of strategy to mitigate dizziness without cueing from PT to demonstrate pt independence managing symptoms.  (Target date: 11/01/15)   Baseline Met 5/23.   Status Achieved            Plan - 10/25/15 1454    Clinical Impression Statement Today's session continued to address high level balance and gait without AD. No issues reported with session. Pt is making steady progress toward goals.   Rehab Potential Good   Clinical Impairments Affecting Rehab Potential positive: age, supportive wife; negative: decreased awareness, chronic R knee pain   PT Frequency 2x / week   PT Duration 8 weeks   PT Treatment/Interventions ADLs/Self Care Home Management;Vestibular;DME Instruction;Gait training;Stair training;Functional mobility  training;Therapeutic activities;Therapeutic exercise;Balance training;Orthotic Fit/Training;Patient/family education;Neuromuscular re-education;Visual/perceptual remediation/compensation   PT Next Visit Plan Focus on community, dynamic gait without SPC. Continue LE strengthening and standing balance.   Consulted and Agree with Plan of Care Patient      Patient will benefit from skilled therapeutic intervention in order to improve the following deficits and impairments:  Postural dysfunction, Abnormal gait, Decreased activity tolerance, Decreased balance, Decreased endurance, Decreased coordination, Dizziness, Decreased knowledge of use of DME, Pain, Decreased strength, Decreased safety awareness, Impaired perceived functional ability, Impaired vision/preception  Visit Diagnosis: Other abnormalities of gait and mobility  Unsteadiness on feet  Dizziness and giddiness  Other lack of coordination     Problem List Patient Active Problem List   Diagnosis Date Noted  . 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 Falls   . Acute deep vein thrombosis (DVT) of left femoral vein ([place]) 07/01/2015  . Pain   . Poor fluid intake   . Essential hypertension   . Type 2 diabetes mellitus with complication, without long-term current use of insulin ([place])   . Disorientation   . Convulsions ([place])   . Acute blood loss anemia   . Hypokalemia   . Ileus ([place])   . Leukocytosis   . Right knee pain   . Cognitive safety issue   . Impulsiveness   . PVC (premature ventricular contraction)   . Abdominal discomfort   . SAH (subarachnoid hemorrhage) ([place]) 06/11/2015  . Emesis   . Subarachnoid hemorrhage ([place]) 06/10/2015  . Hypertension   . Diabetes mellitus without complication ([place])     Willow Ora, PTA, [facility] [address] [place], [place] [address] [phone] 10/26/2015, 12:23 PM  Name:  Jaion Lagrange MRN:  [id number] Date of Birth: July 14, 1950

## 2015-10-30 ENCOUNTER — Ambulatory Visit: Payer: BC Managed Care – PPO | Admitting: Occupational Therapy

## 2015-10-30 ENCOUNTER — Ambulatory Visit: Payer: BC Managed Care – PPO | Admitting: Physical Therapy

## 2015-10-30 ENCOUNTER — Ambulatory Visit: Payer: BC Managed Care – PPO | Admitting: Speech Pathology

## 2015-10-30 ENCOUNTER — Encounter: Payer: Self-pay | Admitting: Physical Therapy

## 2015-10-30 DIAGNOSIS — R42 Dizziness and giddiness: Secondary | ICD-10-CM

## 2015-10-30 DIAGNOSIS — M6281 Muscle weakness (generalized): Secondary | ICD-10-CM

## 2015-10-30 DIAGNOSIS — R2689 Other abnormalities of gait and mobility: Secondary | ICD-10-CM

## 2015-10-30 DIAGNOSIS — R278 Other lack of coordination: Secondary | ICD-10-CM

## 2015-10-30 DIAGNOSIS — R41842 Visuospatial deficit: Secondary | ICD-10-CM

## 2015-10-30 DIAGNOSIS — R2681 Unsteadiness on feet: Secondary | ICD-10-CM

## 2015-10-30 DIAGNOSIS — R41841 Cognitive communication deficit: Secondary | ICD-10-CM

## 2015-10-30 NOTE — Therapy (Signed)
[facility] [address] [place], [place], [address] Phone: [phone]   Fax:  [phone]  Occupational Therapy Treatment  Patient Details  Name: Darryl [name] MRN: [id number] Date of Birth: 01/10/1951 Referring Provider: Dr. Alger Simons  Encounter Date: 10/30/2015      OT End of Session - 10/30/15 1118    Visit Number 8   Number of Visits 13   Date for OT Re-Evaluation 11/09/15   Authorization Type BCBS, no visit limit, no auth needed   OT Start Time 1117   OT Stop Time 1145   OT Time Calculation (min) 28 min   Activity Tolerance Patient tolerated treatment well   Behavior During Therapy [facility] for tasks assessed/performed      Past Medical History  Diagnosis Date  . Hypertension   . Seizures ([place])   . Diabetes mellitus without complication ([place])   . Acute deep vein thrombosis (DVT) of left femoral vein ([place]) 07/01/2015    Past Surgical History  Procedure Laterality Date  . Knee surgery    . Cholecystectomy    . Radiology with anesthesia N/A 06/11/2015    Procedure: RADIOLOGY WITH ANESTHESIA;  Surgeon: [name], MD;  Location: [facility];  Service: Radiology;  Laterality: N/A;    There were no vitals filed for this visit.      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 No/denies          Treatment: Handwriting activities, with emphasis on larger size min v.c.Pt demonstrates ability to write 5 sentences with good legibility and letter size after practice. Environmental scanning pt missed 5/13 items on first pass during environmental scanning with significantly increased time required. Therapist reinforced importance of organized scan pattern with patient and discussed activities that  would be impacted by decreased scanning.                      OT Short Term Goals - 10/23/15 1106    OT SHORT TERM GOAL #1   Title Pt will verbalize understanding of adaptive/compensation strategies for ADLs (including compensation strategies for writing and tremors).--check STGs 10/27/15   Time 4   Period Weeks   Status Achieved   OT SHORT TERM GOAL #2   Title Pt will perform a variety of tabletop scanning with >95% accuracy for IADLs.   Time 4   Period Weeks   Status Achieved  performed 2 m number cancellation and peg design with greater thean 95% accuracy.   OT SHORT TERM GOAL #3   Title Pt will improve R hand coordination for ADLs as shown by improving time on 9-hole peg test by at least 5sec.   Baseline 35.41sec   Time 4   Period Weeks   Status Achieved  26.72           OT Long Term Goals - 10/16/15 1200    OT LONG TERM GOAL #1   Title Pt will be independent with HEP for coordination, R shoulder, and L hand strengthening.--check LTGs 11/09/15   Time 6   Period Weeks   Status New   OT LONG TERM GOAL #2   Title Pt will perform environmental scanning with at least 95% accuracy in busy environment for improved safety for  ADLs.   Time 6   Period Weeks   Status New   OT LONG TERM GOAL #3   Title Pt will write at least 5 sentences with good legibility and good letter/word spacing.   Time 6   Period Weeks   Status New   OT LONG TERM GOAL #4   Title Pt will demo at least 40lbs L grip strength for ADLs.- upgrade goal to pt will demonstrate 50 lbs grip strength for LUE for increased ease with ADLS   Baseline 67ELF   Time 6   Period Weeks   Status Revised   goal met-45 lbs               Plan - 10/30/15 1303    Clinical Impression Statement Pt is progressing towards goals yet he continues to demonstrate difficulties with environmantal scanning.   Rehab Potential Good   Clinical Impairments Affecting Rehab Potential Memory deficits   OT Frequency  2x / week   OT Duration 6 weeks   OT Treatment/Interventions Self-care/ADL training;DME and/or AE instruction;Energy conservation;Therapist, nutritional;Therapeutic exercises;Patient/family education;Visual/perceptual remediation/compensation;Cognitive remediation/compensation;Therapeutic exercise;Moist Heat;Cryotherapy;Manual Therapy;Passive range of motion;Therapeutic activities;Balance training;Neuromuscular education   Plan coordination activities, continue to work towards short term goals.   OT Home Exercise Plan coordination HEP and suggestions for reducing tremors issued 10/04/15, theraband, scapular stability ex, red putty; 10/18/15  visual HEP for diplopia   Consulted and Agree with Plan of Care Patient      Patient will benefit from skilled therapeutic intervention in order to improve the following deficits and impairments:  Decreased coordination, Impaired vision/preception, Decreased balance, Decreased cognition, Decreased knowledge of precautions, Decreased safety awareness, Impaired UE functional use, Decreased strength, Decreased knowledge of use of DME  Visit Diagnosis: Other lack of coordination  Visuospatial deficit  Muscle weakness (generalized)    Problem List Patient Active Problem List   Diagnosis Date Noted  . 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 Falls   . Acute deep vein thrombosis (DVT) of left femoral vein ([place]) 07/01/2015  . Pain   . Poor fluid intake   . Essential hypertension   . Type 2 diabetes mellitus with complication, without long-term current use of insulin ([place])   . Disorientation   . Convulsions ([place])   . Acute blood loss anemia   . Hypokalemia   . Ileus ([place])   . Leukocytosis   . Right knee pain   . Cognitive safety issue   . Impulsiveness   . PVC (premature ventricular contraction)   . Abdominal discomfort   . SAH (subarachnoid hemorrhage) ([place]) 06/11/2015  . Emesis   .  Subarachnoid hemorrhage ([place]) 06/10/2015  . Hypertension   . Diabetes mellitus without complication ([place])     Shameca Landen 10/30/2015, 1:09 PM Theone Murdoch, OTR/L [phone] Phone: [phone] 1:09 PM 10/30/2015 [facility] [address] [place], [place], [address] Phone: [phone]   Fax:  [phone]  Name: Darryl [name] MRN: [id number] Date of Birth: [birth year]

## 2015-10-30 NOTE — Therapy (Signed)
[facility] [address] [place], [place], [address] Phone: [phone]   Fax:  [phone]  Speech Language Pathology Treatment  Patient Details  Name: Darryl [name] MRN: [id number] Date of Birth: 15-Nov-1950 Referring Provider: Dr. Alger Simons  Encounter Date: 10/30/2015      End of Session - 10/30/15 1445    Visit Number 12   Number of Visits 17   Date for SLP Re-Evaluation 11/01/15   Authorization Type BCBS   SLP Start Time 6767   SLP Stop Time  1446   SLP Time Calculation (min) 43 min      Past Medical History  Diagnosis Date  . Hypertension   . Seizures ([place])   . Diabetes mellitus without complication ([place])   . Acute deep vein thrombosis (DVT) of left femoral vein ([place]) 07/01/2015    Past Surgical History  Procedure Laterality Date  . Knee surgery    . Cholecystectomy    . Radiology with anesthesia N/A 06/11/2015    Procedure: RADIOLOGY WITH ANESTHESIA;  Surgeon: [name], MD;  Location: [facility];  Service: Radiology;  Laterality: N/A;    There were no vitals filed for this visit.      Subjective Assessment - 10/30/15 1413    Subjective "i'm far from where I think I should be"   Currently in Pain? No/denies               ADULT SLP TREATMENT - 10/30/15 1413    Pain Assessment   Pain Assessment No/denies pain   Cognitive-Linquistic Treatment   Treatment focused on Cognition   Skilled Treatment Facilitaed divided attention between generating complex multiple meaning words - while partcipating in simple convesation with occasional min A for multiple meaning sentences. Complex  naming in categories with occasoinal min A.   Assessment / Recommendations / Plan   Plan Continue with current plan of care   Progression Toward Goals   Progression toward goals Progressing toward goals            SLP Short Term Goals - 10/30/15 1444    SLP SHORT TERM GOAL #1   Title Pt will utlize  external aids for recall of scedule, daily tasks and events over 3 sessions.    Baseline 10/09/15, 10/16/15, 10/18/15   Time 1   Period Weeks   Status Achieved   SLP SHORT TERM GOAL #2   Title Pt will attend to details on moderately complex cognitive linguistic tasks with rare min A   Time 1   Period Weeks   Status Partially Met   SLP SHORT TERM GOAL #3   Title Pt will sustain attention to cognitive linguistic tasks in distracting environment with rare min A   Time 1   Period Weeks   Status Achieved          SLP Long Term Goals - 10/30/15 1444    SLP LONG TERM GOAL #1   Title Pt will divide attention between 2 moderately complex cognitive linguistic tasks with 85% on each and rare min A.   Time 3   Period Weeks   Status Achieved   SLP LONG TERM GOAL #2   Title Pt will utilize compenstory strategies to recall new information (paragraphs, messages, etc) 85% accuracy and rare min A   Time 3   Period Weeks   Status On-going          Plan - 10/30/15 1443    Clinical Impression Statement Pt continues  to improve divided attention and attention to details with min A. Pt is demonstrating error awareness and anticiaptory awareness of attention impairments with min A. Today he expressed concern re: word finding difficulties 3x a day at least. I provided homework to target word finding.  Continue skilled ST to maximize cognition for eventual return to work.    Speech Therapy Frequency 2x / week   Treatment/Interventions Cognitive reorganization;SLP instruction and feedback;Internal/external aids;Compensatory strategies;Patient/family education;Functional tasks;Cueing hierarchy   Potential to Achieve Goals Good   Potential Considerations Ability to learn/carryover information   Consulted and Agree with Plan of Care Patient      Patient will benefit from skilled therapeutic intervention in order to improve the following deficits and impairments:   Cognitive communication  deficit    Problem List Patient Active Problem List   Diagnosis Date Noted  . 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 Falls   . Acute deep vein thrombosis (DVT) of left femoral vein ([facility]) 07/01/2015  . Pain   . Poor fluid intake   . Essential hypertension   . Type 2 diabetes mellitus with complication, without long-term current use of insulin ([place])   . Disorientation   . Convulsions ([place])   . Acute blood loss anemia   . Hypokalemia   . Ileus ([place])   . Leukocytosis   . Right knee pain   . Cognitive safety issue   . Impulsiveness   . PVC (premature ventricular contraction)   . Abdominal discomfort   . SAH (subarachnoid hemorrhage) ([facility]) 06/11/2015  . Emesis   . Subarachnoid hemorrhage ([facility]) 06/10/2015  . Hypertension   . Diabetes mellitus without complication ([place])     Tyrae Alcoser, Annye Rusk MS, CCC-SLP 10/30/2015, 2:56 PM  [facility] [address] [place], [place], [address] Phone: [phone]   Fax:  [phone]   Name: Izaak Sahr MRN: [id number] Date of Birth: 12-28-1950

## 2015-10-31 NOTE — Therapy (Signed)
[facility] [address] [place], [place], [address] Phone: [phone]   Fax:  [phone]  Physical Therapy Treatment  Patient Details  Name: Darryl [name] MRN: [id number] Date of Birth: Oct 22, 1950 Referring Provider: Alger Simons, MD  Encounter Date: 10/30/2015      PT End of Session - 10/30/15 1455    Visit Number 14   Number of Visits 17   Date for PT Re-Evaluation 12/03/15   Authorization Type BCBS; no visit limit, no auth required   PT Start Time 1450   PT Stop Time 1530   PT Time Calculation (min) 40 min   Equipment Utilized During Treatment Gait belt   Activity Tolerance Patient tolerated treatment well;No increased pain   Behavior During Therapy [facility] for tasks assessed/performed      Past Medical History  Diagnosis Date  . Hypertension   . Seizures ([place])   . Diabetes mellitus without complication ([place])   . Acute deep vein thrombosis (DVT) of left femoral vein ([facility]) 07/01/2015    Past Surgical History  Procedure Laterality Date  . Knee surgery    . Cholecystectomy    . Radiology with anesthesia N/A 06/11/2015    Procedure: RADIOLOGY WITH ANESTHESIA;  Surgeon: [name], MD;  Location: [facility];  Service: Radiology;  Laterality: N/A;    There were no vitals filed for this visit.      Subjective Assessment - 10/30/15 1454    Subjective No new complaints. No falls. No pain currently.    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    Patient Stated Goals "I want to get stronger in my right leg; to get rid of the cane; and to work on balance."   Currently in Pain? No/denies   Pain Score 0-No pain            [facility] Adult PT Treatment/Exercise - 10/30/15 1456    Ambulation/Gait   Ambulation/Gait Yes    Ambulation/Gait Assistance 5: Supervision   Ambulation/Gait Assistance Details couple episodes of toe scuffing noted. no balance losses noted with gait on indoor or outdoor surfaces. some knee pain reported during gait approximatly half way through.                          Ambulation Distance (Feet) 1000 Feet   Assistive device None   Gait Pattern Step-through pattern   Ambulation Surface Level;Unlevel;Indoor;Outdoor;Paved;Gravel;Grass   High Level Balance   High Level Balance Activities Marching forwards;Marching backwards;Tandem walking  tandem fwd/bwd, toe walking fwd/bwd   High Level Balance Comments on red mats x 3 laps each fwd/bwd with min guard to min assist for balance and cues on form/technique.              Balance Exercises - 10/30/15 1525    Balance Exercises: Standing   Balance Beam blue foam balance beam: tandem walking fwd/bwd and side stepping left<>right x 3 laps each with no UE support, min gueard to min assist with cues on posture and ex form/technique.              PT Short Term Goals - 10/04/15 2040    PT SHORT TERM GOAL #1   Title Pt will perform initial home exercise program with mod I using paper  handout to indicate safe HEP compliance. (Target date: 10/04/15)   Baseline Pt reports compliance with walking program and demonstrates x1 viewing effectively.   Status Achieved   PT SHORT TERM GOAL #2   Title Pt will increase self-selected gait speed from 1.66 ft/sec > 1.8 ft/sec to indicate decreased risk of recurrent falls.    Baseline 5/2: gait velocity = 3.18 ft/sec   Status Achieved   PT SHORT TERM GOAL #3   Title Pt will increase Berg from 31/56 to 35/56 to indicate improved standing balance.    Baseline 5/2: Berg score = 54/56.   Status Achieved   PT SHORT TERM GOAL #4   Title Pt will ambulate 200' over level, indoor surfaces with mod I using LRAD to indicate increased safety with household mobility.     Status Achieved   PT SHORT TERM GOAL #5    Title Pt will negotiate 14 stairs with 2 rails with mod I to enable progress toward safety/independence accessing second floor of home.    Baseline Met 5/2.   Status Achieved   PT SHORT TERM GOAL #6   Title Pt will negotiate 3 stairs without rails with mod I using LRAD to enable pt to safely access second floor of home.    Baseline Met 5/4.   Status Achieved   PT SHORT TERM GOAL #7   Title Pt will negotiate 6 stairs with 1 rail with mod I to enable pt to safely use primary home entrance.   Of 14 stairs to access second floor, 3 stairs have no rails.    Baseline Met 5/2.   Status Achieved   PT SHORT TERM GOAL #8   Title Pt will perform floor transfer with min A using standard chair or mat table to progress toward safety with fall recovery.   Baseline Met 5/2.   Status Achieved           PT Long Term Goals - 10/23/15 1506    PT LONG TERM GOAL #1   Title Pt will increase self-selected gait speed from 1.66 ft/sec to > / = 2.26 ft/sec to demonstrate increased efficiency of ambulation.   (Target date: 11/01/15)   Baseline 5/2: gait velocity = 3.18 ft/sec   Status Achieved   PT LONG TERM GOAL #2   Title Pt will improve Berg score from 31/56 to 39/56 to indicate significant improvement in functional standing balance.    Baseline 5/2: [name] = 54/56   Status Achieved   PT LONG TERM GOAL #3   Title Pt will independently ambulate 1,000' over unlevel, paved surfaces with no rest breaks to indicate improved endurance and increased safety with community mobility.   Baseline 5/2: REVISED from mod I to independent without AD due to pt progress.   Status On-going   PT LONG TERM GOAL #4   Title Pt will independently negotiate standard ramp and curb step to indicate increased safety traversing community obstacles.    Baseline 5/2: REVISED from mod I to independent without AD due to pt progress.   Status On-going   PT LONG TERM GOAL #5   Title Pt will perform floor transfer with mod I using  standard chair or mat table to indicate increased safety with fall recovery.   Baseline Met 5/2.   Status Achieved   PT LONG TERM GOAL #6   Title Pt will perform all above aspects of functional mobility with no more than 2-point increase in pain rating to indicate decreased impact of  pain on functional activity tolerance.   Status On-going   PT LONG TERM GOAL #7   Title Pt will improve FGA score from 11/20 to >/= 19/20 to indicate decreased fall risk.   (Target date: 11/01/15)   Baseline 5/23: FGA = 22/30   Status Achieved   PT LONG TERM GOAL #8   Title Pt will demonstrate use of strategy to mitigate dizziness without cueing from PT to demonstrate pt independence managing symptoms.  (Target date: 11/01/15)   Baseline Met 5/23.   Status Achieved            Plan - 10/30/15 1455    Clinical Impression Statement Today's skilled session continued to address gait without AD on outdoor surfaces and high level balance activities. Pt is makign great progress toward goals.   Rehab Potential Good   Clinical Impairments Affecting Rehab Potential positive: age, supportive wife; negative: decreased awareness, chronic R knee pain   PT Frequency 2x / week   PT Duration 8 weeks   PT Treatment/Interventions ADLs/Self Care Home Management;Vestibular;DME Instruction;Gait training;Stair training;Functional mobility training;Therapeutic activities;Therapeutic exercise;Balance training;Orthotic Fit/Training;Patient/family education;Neuromuscular re-education;Visual/perceptual remediation/compensation   PT Next Visit Plan Focus on community, dynamic gait without SPC. Continue LE strengthening and standing balance.   Consulted and Agree with Plan of Care Patient      Patient will benefit from skilled therapeutic intervention in order to improve the following deficits and impairments:  Postural dysfunction, Abnormal gait, Decreased activity tolerance, Decreased balance, Decreased endurance, Decreased  coordination, Dizziness, Decreased knowledge of use of DME, Pain, Decreased strength, Decreased safety awareness, Impaired perceived functional ability, Impaired vision/preception  Visit Diagnosis: Other abnormalities of gait and mobility  Unsteadiness on feet  Dizziness and giddiness  Other lack of coordination     Problem List Patient Active Problem List   Diagnosis Date Noted  . 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 Falls   . Acute deep vein thrombosis (DVT) of left femoral vein ([facility]) 07/01/2015  . Pain   . Poor fluid intake   . Essential hypertension   . Type 2 diabetes mellitus with complication, without long-term current use of insulin ([place] [facility])   . Disorientation   . Convulsions ([place])   . Acute blood loss anemia   . Hypokalemia   . Ileus ([place])   . Leukocytosis   . Right knee pain   . Cognitive safety issue   . Impulsiveness   . PVC (premature ventricular contraction)   . Abdominal discomfort   . SAH (subarachnoid hemorrhage) ([facility]) 06/11/2015  . Emesis   . Subarachnoid hemorrhage ([place]) 06/10/2015  . Hypertension   . Diabetes mellitus without complication ([place])     Willow Ora, PTA, [facility] [address], [place], [place] [address] [phone] 10/31/2015, 6:35 PM  Name: Darryl [name] MRN: [id number] Date of Birth: [birth year]

## 2015-11-01 ENCOUNTER — Ambulatory Visit: Payer: BC Managed Care – PPO | Admitting: Occupational Therapy

## 2015-11-01 ENCOUNTER — Ambulatory Visit: Payer: BC Managed Care – PPO | Admitting: Physical Therapy

## 2015-11-01 ENCOUNTER — Ambulatory Visit: Payer: BC Managed Care – PPO | Admitting: Speech Pathology

## 2015-11-06 ENCOUNTER — Ambulatory Visit: Payer: BC Managed Care – PPO | Admitting: Physical Therapy

## 2015-11-06 ENCOUNTER — Ambulatory Visit: Payer: BC Managed Care – PPO | Admitting: Occupational Therapy

## 2015-11-08 ENCOUNTER — Ambulatory Visit: Payer: BC Managed Care – PPO | Attending: Family Medicine | Admitting: Physical Therapy

## 2015-11-08 ENCOUNTER — Ambulatory Visit: Payer: BC Managed Care – PPO

## 2015-11-08 ENCOUNTER — Ambulatory Visit: Payer: BC Managed Care – PPO | Admitting: Occupational Therapy

## 2015-11-08 DIAGNOSIS — R278 Other lack of coordination: Secondary | ICD-10-CM | POA: Insufficient documentation

## 2015-11-08 DIAGNOSIS — M6281 Muscle weakness (generalized): Secondary | ICD-10-CM | POA: Insufficient documentation

## 2015-11-08 DIAGNOSIS — R2689 Other abnormalities of gait and mobility: Secondary | ICD-10-CM | POA: Insufficient documentation

## 2015-11-08 DIAGNOSIS — R42 Dizziness and giddiness: Secondary | ICD-10-CM | POA: Insufficient documentation

## 2015-11-08 DIAGNOSIS — R41841 Cognitive communication deficit: Secondary | ICD-10-CM | POA: Insufficient documentation

## 2015-11-08 DIAGNOSIS — I69118 Other symptoms and signs involving cognitive functions following nontraumatic intracerebral hemorrhage: Secondary | ICD-10-CM | POA: Insufficient documentation

## 2015-11-08 DIAGNOSIS — R41842 Visuospatial deficit: Secondary | ICD-10-CM | POA: Insufficient documentation

## 2015-11-08 DIAGNOSIS — R2681 Unsteadiness on feet: Secondary | ICD-10-CM | POA: Insufficient documentation

## 2015-11-13 ENCOUNTER — Encounter: Payer: Self-pay | Admitting: Physical Therapy

## 2015-11-13 ENCOUNTER — Ambulatory Visit: Payer: BC Managed Care – PPO | Admitting: Physical Therapy

## 2015-11-13 ENCOUNTER — Ambulatory Visit: Payer: BC Managed Care – PPO | Admitting: Occupational Therapy

## 2015-11-13 DIAGNOSIS — R42 Dizziness and giddiness: Secondary | ICD-10-CM

## 2015-11-13 DIAGNOSIS — I69118 Other symptoms and signs involving cognitive functions following nontraumatic intracerebral hemorrhage: Secondary | ICD-10-CM | POA: Diagnosis present

## 2015-11-13 DIAGNOSIS — R41841 Cognitive communication deficit: Secondary | ICD-10-CM | POA: Diagnosis not present

## 2015-11-13 DIAGNOSIS — R278 Other lack of coordination: Secondary | ICD-10-CM

## 2015-11-13 DIAGNOSIS — M6281 Muscle weakness (generalized): Secondary | ICD-10-CM

## 2015-11-13 DIAGNOSIS — R2689 Other abnormalities of gait and mobility: Secondary | ICD-10-CM | POA: Diagnosis present

## 2015-11-13 DIAGNOSIS — R41842 Visuospatial deficit: Secondary | ICD-10-CM

## 2015-11-13 DIAGNOSIS — R2681 Unsteadiness on feet: Secondary | ICD-10-CM | POA: Diagnosis present

## 2015-11-13 NOTE — Therapy (Signed)
[facility] [address] [place], [place], [address] Phone: [phone]   Fax:  [phone]  Occupational Therapy Treatment  Patient Details  Name: Darryl [name] MRN: [id number] Date of Birth: 10-05-1950 Referring Provider: Dr. Alger Simons  Encounter Date: 11/13/2015      OT End of Session - 11/13/15 0928    Visit Number 9   Number of Visits 13   Date for OT Re-Evaluation 11/13/15   Authorization Type BCBS, no visit limit, no auth needed   OT Start Time 0850   OT Stop Time 0930   OT Time Calculation (min) 40 min   Activity Tolerance Patient tolerated treatment well   Behavior During Therapy [facility] for tasks assessed/performed      Past Medical History  Diagnosis Date  . Hypertension   . Seizures ([place])   . Diabetes mellitus without complication ([place])   . Acute deep vein thrombosis (DVT) of left femoral vein ([place]) 07/01/2015    Past Surgical History  Procedure Laterality Date  . Knee surgery    . Cholecystectomy    . Radiology with anesthesia N/A 06/11/2015    Procedure: RADIOLOGY WITH ANESTHESIA;  Surgeon: [name], MD;  Location: [facility];  Service: Radiology;  Laterality: N/A;    There were no vitals filed for this visit.           Treatment: Therapist checked progress towards goals , pt requests d/c today. Environmental scanning, pt missed 5/13 cards on first pass. Therapist upgraded pt's HEP to green theraband and green theraputty, 10-15 reps each, min v.c. Pt returned demonstration. Therapist discussed recommendation that pt does not return to driving yet due to decreased attention to detail, and decreased visual scanning. Therapist  recommends pt double checks his work and has a Product manager double check behind him initially when he returns to work. Arm bike x 6 mins level 4 for conditioning. Pt reports he is planning to continue working with ST on cognition.                     OT Short Term Goals - 10/23/15 1106    OT SHORT TERM GOAL #1   Title Pt will verbalize understanding of adaptive/compensation strategies for ADLs (including compensation strategies for writing and tremors).--check STGs 10/27/15   Time 4   Period Weeks   Status Achieved   OT SHORT TERM GOAL #2   Title Pt will perform a variety of tabletop scanning with >95% accuracy for IADLs.   Time 4   Period Weeks   Status Achieved  performed 2 m number cancellation and peg design with greater thean 95% accuracy.   OT SHORT TERM GOAL #3   Title Pt will improve R hand coordination for ADLs as shown by improving time on 9-hole peg test by at least 5sec.   Baseline 35.41sec   Time 4   Period Weeks   Status Achieved  26.72           OT Long Term Goals - 11/13/15 5726    OT LONG TERM GOAL #1   Title Pt will be independent with HEP for coordination, R shoulder, and L hand strengthening.--check LTGs 11/09/15   Time 6   Period Weeks   Status Achieved   OT LONG TERM GOAL #2   Title Pt will perform environmental scanning with at least 95% accuracy in busy environment for improved safety for ADLs.   Time 6   Period Weeks  Status Not Met  5/13 items missed   OT LONG TERM GOAL #3   Title Pt will write at least 5 sentences with good legibility and good letter/word spacing.   Time 6   Period Weeks   Status Achieved   OT LONG TERM GOAL #4   Title Pt will demo at least 40lbs L grip strength for ADLs.- upgrade goal to pt will demonstrate 50 lbs grip strength for LUE for increased ease with ADLS   Time 6   Period Weeks   Status Partially Met  40 lbs, inital goal met, revised goal not met               Plan - 11/13/15 0927    Clinical Impression Statement Pt requests discharge from occupational therapy today. He hopes to return to work soon.   OT Frequency 2x / week   OT Duration 6 weeks   OT Treatment/Interventions Self-care/ADL training;DME and/or AE instruction;Energy  conservation;Therapist, nutritional;Therapeutic exercises;Patient/family education;Visual/perceptual remediation/compensation;Cognitive remediation/compensation;Therapeutic exercise;Moist Heat;Cryotherapy;Manual Therapy;Passive range of motion;Therapeutic activities;Balance training;Neuromuscular education   Plan discharge OT   OT Home Exercise Plan coordination HEP and suggestions for reducing tremors issued 10/04/15, theraband, scapular stability ex, red putty; 10/18/15  visual HEP for diplopia   Consulted and Agree with Plan of Care Patient      Patient will benefit from skilled therapeutic intervention in order to improve the following deficits and impairments:  Decreased coordination, Impaired vision/preception, Decreased balance, Decreased cognition, Decreased knowledge of precautions, Decreased safety awareness, Impaired UE functional use, Decreased strength, Decreased knowledge of use of DME  Visit Diagnosis: Other symptoms and signs involving cognitive functions following nontraumatic intracerebral hemorrhage  Muscle weakness (generalized)  Other lack of coordination  Visuospatial deficit   OCCUPATIONAL THERAPY DISCHARGE SUMMARY    Current functional level related to goals / functional outcomes: Pt made good overall progress towards goals, yet he continues to demonstrate limitations in cognition and visual scanning.   Remaining deficits: Cognition, visual perceptual skills, decreased strength, coordination   Education / Equipment: Pt was educated regarding:HEP, compensation for tremors, and handwriting strategies.  Therapist  also cautioned pt regarding safety for driving as his environmental scanning is still impaired as well as attention to detail. Pt plans to continue with ST regarding cognition. Plan: Patient agrees to discharge.  Patient goals were partially met. Patient is being discharged due to the patient's request.  ?????     Problem List Patient Active  Problem List   Diagnosis Date Noted  . 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 Falls   . Acute deep vein thrombosis (DVT) of left femoral vein ([facility]) 07/01/2015  . Pain   . Poor fluid intake   . Essential hypertension   . Type 2 diabetes mellitus with complication, without long-term current use of insulin ([place])   . Disorientation   . Convulsions ([place])   . Acute blood loss anemia   . Hypokalemia   . Ileus ([place])   . Leukocytosis   . Right knee pain   . Cognitive safety issue   . Impulsiveness   . PVC (premature ventricular contraction)   . Abdominal discomfort   . SAH (subarachnoid hemorrhage) ([place]) 06/11/2015  . Emesis   . Subarachnoid hemorrhage ([place]) 06/10/2015  . Hypertension   . Diabetes mellitus without complication ([place])     Dianah Pruett 11/13/2015, 9:29 AM Theone Murdoch, OTR/L [phone] Phone: [phone] 9:29 AM 11/13/2015 [facility] [address]  [place], [place], [address] Phone: [phone]   Fax:  [phone]  Name: Darryl [name] MRN: [id number] Date of Birth: March 17, 1951

## 2015-11-13 NOTE — Therapy (Addendum)
[address] [place], [place], [address] Phone: [phone]   Fax:  [phone]  Physical Therapy Treatment  Patient Details  Name: Darryl [name] MRN: [id number] Date of Birth: 1950/07/10 Referring Provider: Alger Simons, MD  Encounter Date: 11/13/2015      PT End of Session - 11/13/15 0806    Visit Number 15   Number of Visits 17   Date for PT Re-Evaluation 12/03/15   Authorization Type BCBS; no visit limit, no auth required   PT Start Time 0802   PT Stop Time 0825  discharge visit, not all time needed   PT Time Calculation (min) 23 min   Equipment Utilized During Treatment Gait belt   Activity Tolerance Patient tolerated treatment well;No increased pain   Behavior During Therapy [facility] for tasks assessed/performed      Past Medical History  Diagnosis Date  . Hypertension   . Seizures ([place])   . Diabetes mellitus without complication ([place])   . Acute deep vein thrombosis (DVT) of left femoral vein ([place]) 07/01/2015    Past Surgical History  Procedure Laterality Date  . Knee surgery    . Cholecystectomy    . Radiology with anesthesia N/A 06/11/2015    Procedure: RADIOLOGY WITH ANESTHESIA;  Surgeon: [name], MD;  Location: [facility];  Service: Radiology;  Laterality: N/A;    There were no vitals filed for this visit.      Subjective Assessment - 11/13/15 0806    Subjective No new complaints. No falls. No pain currently.    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    Patient Stated Goals "I want to get stronger in my right leg; to get rid of the cane; and to work on balance."   Currently in Pain? No/denies   Pain Score 0-No pain             [facility] Adult PT Treatment/Exercise - 11/13/15 0807    Transfers   Transfers Sit to Stand;Stand to Sit   Sit to Stand  7: Independent   Stand to Sit 7: Independent   Ambulation/Gait   Ambulation/Gait Yes   Ambulation/Gait Assistance 7: Independent   Ambulation/Gait Assistance Details no  issues noted. pain in hips 2/10 after session.   Ambulation Distance (Feet) 1000 Feet   Assistive device None   Gait Pattern Step-through pattern   Ambulation Surface Level;Unlevel;Indoor;Outdoor;Paved;Gravel;Grass            PT Education - 11/13/15 0829    Education provided Yes   Education Details HEP: to continue with current exercises and how to advance them when they become easy   Person(s) Educated Patient   Methods Explanation;Verbal cues   Comprehension Verbalized understanding;Returned demonstration          PT Short Term Goals - 10/04/15 2040    PT SHORT TERM GOAL #1   Title Pt will perform initial home exercise program with mod I using paper handout to indicate safe HEP compliance. (Target date: 10/04/15)   Baseline Pt reports compliance with walking program and demonstrates x1 viewing effectively.   Status Achieved   PT SHORT TERM GOAL #2   Title Pt will increase self-selected gait speed from 1.66 ft/sec > 1.8 ft/sec to indicate decreased risk of recurrent falls.    Baseline 5/2: gait velocity = 3.18 ft/sec   Status Achieved   PT  SHORT TERM GOAL #3   Title Pt will increase Berg from 31/56 to 35/56 to indicate improved standing balance.    Baseline 5/2: Berg score = 54/56.   Status Achieved   PT SHORT TERM GOAL #4   Title Pt will ambulate 200' over level, indoor surfaces with mod I using LRAD to indicate increased safety with household mobility.     Status Achieved   PT SHORT TERM GOAL #5   Title Pt will negotiate 14 stairs with 2 rails with mod I to enable progress toward safety/independence accessing second floor of home.    Baseline Met 5/2.   Status Achieved   PT SHORT TERM GOAL #6   Title Pt will negotiate 3 stairs without rails with mod I using LRAD to enable pt to safely access second  floor of home.    Baseline Met 5/4.   Status Achieved   PT SHORT TERM GOAL #7   Title Pt will negotiate 6 stairs with 1 rail with mod I to enable pt to safely use primary home entrance.   Of 14 stairs to access second floor, 3 stairs have no rails.    Baseline Met 5/2.   Status Achieved   PT SHORT TERM GOAL #8   Title Pt will perform floor transfer with min A using standard chair or mat table to progress toward safety with fall recovery.   Baseline Met 5/2.   Status Achieved           PT Long Term Goals - 11/13/15 0806    PT LONG TERM GOAL #1   Title Pt will increase self-selected gait speed from 1.66 ft/sec to > / = 2.26 ft/sec to demonstrate increased efficiency of ambulation.   (Target date: 11/01/15)   Baseline 5/2: gait velocity = 3.18 ft/sec   Status Achieved   PT LONG TERM GOAL #2   Title Pt will improve Berg score from 31/56 to 39/56 to indicate significant improvement in functional standing balance.    Baseline 5/2: [name] = 54/56   Status Achieved   PT LONG TERM GOAL #3   Title Pt will independently ambulate 1,000' over unlevel, paved surfaces with no rest breaks to indicate improved endurance and increased safety with community mobility.   Baseline 5/2: REVISED from mod I to independent without AD due to pt progress.- met on 11/13/15   Status Achieved   PT LONG TERM GOAL #4   Title Pt will independently negotiate standard ramp and curb step to indicate increased safety traversing community obstacles.    Baseline 5/2: REVISED from mod I to independent without AD due to pt progress.- met 11/13/15   Status Achieved   PT LONG TERM GOAL #5   Title Pt will perform floor transfer with mod I using standard chair or mat table to indicate increased safety with fall recovery.   Baseline Met 5/2.   Status Achieved   PT LONG TERM GOAL #6   Title Pt will perform all above aspects of functional mobility with no more than 2-point increase in pain rating to indicate decreased impact of  pain on functional activity tolerance.   Baseline met on 11/13/15   Status Achieved   PT LONG TERM GOAL #7   Title Pt will improve FGA score from 11/20 to >/= 19/20 to indicate decreased fall risk.   (Target date: 11/01/15)   Baseline 5/23: FGA = 22/30   Status Achieved   PT LONG TERM GOAL #8   Title Pt will  demonstrate use of strategy to mitigate dizziness without cueing from PT to demonstrate pt independence managing symptoms.  (Target date: 11/01/15)   Baseline Met 5/23.   Status Achieved          Plan - 11/13/15 0806    Clinical Impression Statement Pt has met all LTGs and is agreeable to discharge today.    Rehab Potential Good   Clinical Impairments Affecting Rehab Potential positive: age, supportive wife; negative: decreased awareness, chronic R knee pain   PT Frequency 2x / week   PT Duration 8 weeks   PT Treatment/Interventions ADLs/Self Care Home Management;Vestibular;DME Instruction;Gait training;Stair training;Functional mobility training;Therapeutic activities;Therapeutic exercise;Balance training;Orthotic Fit/Training;Patient/family education;Neuromuscular re-education;Visual/perceptual remediation/compensation   PT Next Visit Plan discharge today   Consulted and Agree with Plan of Care Patient      Patient will benefit from skilled therapeutic intervention in order to improve the following deficits and impairments:  Postural dysfunction, Abnormal gait, Decreased activity tolerance, Decreased balance, Decreased endurance, Decreased coordination, Dizziness, Decreased knowledge of use of DME, Pain, Decreased strength, Decreased safety awareness, Impaired perceived functional ability, Impaired vision/preception  Visit Diagnosis: Other abnormalities of gait and mobility  Unsteadiness on feet  Dizziness and giddiness  Muscle weakness (generalized)     Problem List Patient Active Problem List   Diagnosis Date Noted  . 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 Falls   . Acute deep vein thrombosis (DVT) of left femoral vein ([place]) 07/01/2015  . Pain   . Poor fluid intake   . Essential hypertension   . Type 2 diabetes mellitus with complication, without long-term current use of insulin ([place])   . Disorientation   . Convulsions ([place])   . Acute blood loss anemia   . Hypokalemia   . Ileus ([place])   . Leukocytosis   . Right knee pain   . Cognitive safety issue   . Impulsiveness   . PVC (premature ventricular contraction)   . Abdominal discomfort   . SAH (subarachnoid hemorrhage) ([place]) 06/11/2015  . Emesis   . Subarachnoid hemorrhage ([place]) 06/10/2015  . Hypertension   . Diabetes mellitus without complication ([place])     Willow Ora, PTA, [facility] [place] [address] [place], [place] [address] [phone] 11/13/2015, 8:32 AM   Name: Darryl [name] MRN: [id number] Date of Birth: 03-21-1951    Addendum by Billie Ruddy, PT, DPT [facility] [address] [place], [place], [address] Phone: [phone]   Fax:  [phone] 11/13/2015, 9:53 AM   PHYSICAL THERAPY DISCHARGE SUMMARY  Visits from Start of Care: 15  Current functional level related to goals / functional outcomes: See above goals and findings/outcomes.   Remaining deficits: Pt continues to intermittently report R knee pain with prolonged standing/walking. This is due to longstanding h/o R knee OA, per chart. Pt is proficient in HEP to address hip weakness, decreased muscle extensibility in RLE, and decreased motor control in R VMO, all of which are likely attributing to R knee discomfort with mobility.    Education / Equipment: HEP to address dizziness, for generalized strengthening/balance, and for RLE stretching/strengthening to address R hip/knee pain;  fall prevention strategies in home environment. Plan: Patient agrees to discharge.  Patient goals were  met. Patient is being discharged due to meeting the stated rehab goals.  ?????       Billie Ruddy, PT, [facility] [address] [place], [place], [address]  Phone: [phone]   Fax:  [phone] 11/13/2015, 9:57 AM

## 2015-11-14 ENCOUNTER — Ambulatory Visit: Payer: BC Managed Care – PPO

## 2015-11-14 DIAGNOSIS — R41841 Cognitive communication deficit: Secondary | ICD-10-CM

## 2015-11-14 NOTE — Patient Instructions (Addendum)
Talk with your doctor about what he/she thinks about neuropsychological testing.  I would caution you against "multitasking" in the following tasks: Driving, cooking, household tasks requiring increased focus:checkbook balancing, e.g.   Divided Attention ideas - do any of these together  Playing a card game either by yourself or with someone  Playing a board game with someone  Sorting playing cards  Writing down commercials on TV or the radio  Writing down news stories on TV or radio  Working outside with yard work, gardening, Catering manageretc.  [employer] City[place]ashing dishes  Recite state names and capitals  Complete long division or 2 or 3 digit multiplication problems  Listen to a Therapist, sportsspeech online (i.e., YouTube), to online news (NPR, CNN, etc.), or to a family member, and give pertinent information from the speech, news story, or conversation after it is over  Empty the dishwasher  Complete paper/pencil puzzles (sudoku, crosswords, word search)  Complete puzzles online (www.lumosity.com - computer or tablet based, or www.constanttherapy.com for information on this app)  Therapy worksheets/exercises  Put together a puzzle  State coins needed to make a certain change amount  Have someone read a portion of a novel to you, and you give a summary    Start at a level that you are challenged by, but also where you can see some success

## 2015-11-14 NOTE — Therapy (Signed)
[facility] [address] [place], [place], [address] Phone: [phone]   Fax:  [phone]  Speech Language Pathology Treatment  Patient Details  Name: Darryl [name] MRN: [id number] Date of Birth: 1951-04-17 Referring Provider: Dr. Alger Simons  Encounter Date: 11/14/2015      End of Session - 11/14/15 1251    Visit Number 13   Number of Visits 17   Date for SLP Re-Evaluation 11/01/15   SLP Start Time 1155   SLP Stop Time  1232  pt arrived 10 minutes late   SLP Time Calculation (min) 37 min      Past Medical History  Diagnosis Date  . Hypertension   . Seizures ([place])   . Diabetes mellitus without complication ([place])   . Acute deep vein thrombosis (DVT) of left femoral vein ([place]) 07/01/2015    Past Surgical History  Procedure Laterality Date  . Knee surgery    . Cholecystectomy    . Radiology with anesthesia N/A 06/11/2015    Procedure: RADIOLOGY WITH ANESTHESIA;  Surgeon: [name], MD;  Location: [facility];  Service: Radiology;  Laterality: N/A;    There were no vitals filed for this visit.      Subjective Assessment - 11/14/15 1156    Subjective Pt arrived 10 minutes late to therapy. "Today is my last visit."               ADULT SLP TREATMENT - 11/14/15 1200    General Information   Behavior/Cognition Alert;Cooperative;Pleasant mood   Treatment Provided   Treatment provided Cognitive-Linquistic   Cognitive-Linquistic Treatment   Treatment focused on Cognition   Skilled Treatment Pt complained this morning of "I can't find my words." SLP inquired about how the homework for word finding went and pt told SLP he does not work with words, he works with numbers as an Optometrist. Pt did not appear to have any difficulty with word finding in his session today. SLP facilitated divided attention tasks with pt today. In simple tasks pt was able to divide attention what appeared to be at least 90% of  the time, but he req'd SLP min cues for accuracy with dividing simple and mod complex tasks, or dividing two mod complex tasks.   Assessment / Recommendations / Plan   Plan Discharge SLP treatment due to (comment)  pt request   Progression Toward Goals   Progression toward goals Progressing toward goals          SLP Education - 11/14/15 1249    Education provided Yes   Education Details Divided attention tasks at home, caution against divided attention tasks: driving, cooking, checkbook balancing and other household tasks requiring heavy concentration   Person(s) Educated Patient   Methods Explanation;Handout   Comprehension Verbalized understanding          SLP Short Term Goals - 10/30/15 1444    SLP SHORT TERM GOAL #1   Title Pt will utlize external aids for recall of scedule, daily tasks and events over 3 sessions.    Baseline 10/09/15, 10/16/15, 10/18/15   Time 1   Period Weeks   Status Achieved   SLP SHORT TERM GOAL #2   Title Pt will attend to details on moderately complex cognitive linguistic tasks with rare min A   Time 1   Period Weeks   Status Partially Met   SLP SHORT TERM GOAL #3   Title Pt will sustain attention to cognitive linguistic tasks in distracting environment with  rare min A   Time 1   Period Weeks   Status Achieved          SLP Long Term Goals - 11/14/15 1200    SLP LONG TERM GOAL #1   Title Pt will divide attention between 2 moderately complex cognitive linguistic tasks with 85% on each and rare min A.   Time --   Period --   Status Achieved   SLP LONG TERM GOAL #2   Title Pt will utilize compenstory strategies to recall new information (paragraphs, messages, etc) 85% accuracy and rare min A   Time --   Period --   Status Partially Met  pt indicated he could write things down more often than he did before to compensate          Plan - 11/14/15 1252    Clinical Impression Statement Pt continues to demo divided attention and attention to  details deficits, requiring SLP min A, especially in paired mod complex and simple tasks, or two paired mod complex tasks. 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 SLP therefore questions the reasoning for this complaint, and could indicate cont'd cognitive deficits with insight/awareness. Pt discharged himself from [facility] stating he wants to work at home on remaining defiicts.   Speech Therapy Frequency 2x / week   Treatment/Interventions Cognitive reorganization;SLP instruction and feedback;Internal/external aids;Compensatory strategies;Patient/family education;Functional tasks;Cueing hierarchy   Potential to Achieve Goals Good   Potential Considerations Ability to learn/carryover information   Consulted and Agree with Plan of Care Patient      Patient will benefit from skilled therapeutic intervention in order to improve the following deficits and impairments:   Cognitive communication deficit   SPEECH THERAPY DISCHARGE SUMMARY  Visits from Start of Care: 13  Current functional level related to goals / functional outcomes: Pt made gains in memory compensations and with some attention skills. However this SLP does not think pt is at his baseline cognitive-linguistic ability. See goal update and "clincial impression statement" for details. Pt entered the ST session today 10 minutes late and stated that it would be his last day in therapy, that he would work the rest of the time at home.  SLP believes pt could have likely made more efficient progress if he would have chosen to remain in therapy.   Remaining deficits: Higher level cognitive-linguistic deficits.   Education / Equipment: Compensations for memory, compensations for divided attention  Plan: Patient agrees to discharge.  Patient goals were partially met. Patient is being discharged due to the patient's request.  ?????        Problem  List Patient Active Problem List   Diagnosis Date Noted  . 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 Falls   . Acute deep vein thrombosis (DVT) of left femoral vein ([place]) 07/01/2015  . Pain   . Poor fluid intake   . Essential hypertension   . Type 2 diabetes mellitus with complication, without long-term current use of insulin ([place])   . Disorientation   . Convulsions ([place])   . Acute blood loss anemia   . Hypokalemia   . Ileus ([place])   . Leukocytosis   . Right knee pain   . Cognitive safety issue   . Impulsiveness   . PVC (premature ventricular contraction)   . Abdominal discomfort   . SAH (subarachnoid hemorrhage) ([place]) 06/11/2015  .  Emesis   . Subarachnoid hemorrhage ([place]) 06/10/2015  . Hypertension   . Diabetes mellitus without complication ([place])     [place] ,[place], [place]  11/14/2015, 5:09 PM  [facility] [address] [place], [place], [address] Phone: [phone]   Fax:  [phone]   Name: Darryl [name] MRN: [id number] Date of Birth: 05/11/1951

## 2015-11-16 ENCOUNTER — Ambulatory Visit: Payer: BC Managed Care – PPO | Admitting: Physical Therapy

## 2016-05-05 ENCOUNTER — Other Ambulatory Visit (HOSPITAL_COMMUNITY): Payer: Self-pay | Admitting: Neurosurgery

## 2016-05-05 ENCOUNTER — Other Ambulatory Visit: Payer: Self-pay | Admitting: Neurosurgery

## 2016-05-05 DIAGNOSIS — I609 Nontraumatic subarachnoid hemorrhage, unspecified: Secondary | ICD-10-CM

## 2016-05-09 ENCOUNTER — Encounter: Payer: Self-pay | Admitting: Internal Medicine

## 2016-05-14 ENCOUNTER — Other Ambulatory Visit: Payer: Self-pay | Admitting: Radiology

## 2016-05-15 ENCOUNTER — Encounter (INDEPENDENT_AMBULATORY_CARE_PROVIDER_SITE_OTHER): Payer: Self-pay

## 2016-05-15 ENCOUNTER — Ambulatory Visit (INDEPENDENT_AMBULATORY_CARE_PROVIDER_SITE_OTHER): Payer: BC Managed Care – PPO | Admitting: Internal Medicine

## 2016-05-15 ENCOUNTER — Encounter: Payer: Self-pay | Admitting: Internal Medicine

## 2016-05-15 VITALS — BP 132/70 | HR 67 | Ht 71.0 in | Wt 182.6 lb

## 2016-05-15 DIAGNOSIS — I493 Ventricular premature depolarization: Secondary | ICD-10-CM | POA: Diagnosis not present

## 2016-05-15 NOTE — Progress Notes (Signed)
Patient Care Team: Darryl RakersVeita Bland, MD as PCP - General (Family Medicine)   HPI  Darryl [name] is a 65 y.o. male Referred from? Atrial flutter. In fact the issue is PVCs and bradycardia  The patient saw Dr. Derek JackWC 12/16 for PVCs. Following the below noted evaluation beta blockers were started. This was subsequently discontinued because of resting rates in the 40s  12/16 Holter monitor report 14.9% PVCs There are at least 3 distinct morphologies noted. Unfortunately these are not quantitated. There appears to be a dominant and a secondary ( personally reviewed)  12/16 Myoview demonstrated normal EF.  After having been seen, his course was complicated by a subarachnoid hemorrhage associated with intracranial aneurysm. He was intubated and since [place] for some time. That course was complicated by the development of a popliteal DVT for which he was started on Xarelto and which has been maintained here to date  He also developed a seizure disorder. His neurologist has been concerned about his slow heart beat.      Past Medical History:  Diagnosis Date  . Acute deep vein thrombosis (DVT) of left femoral vein ([facility]) 07/01/2015  . Diabetes mellitus without complication ([facility])   . Hypertension   . Seizures ([facility])     Past Surgical History:  Procedure Laterality Date  . CHOLECYSTECTOMY    . KNEE SURGERY    . RADIOLOGY WITH ANESTHESIA N/A 06/11/2015   Procedure: RADIOLOGY WITH ANESTHESIA;  Surgeon: Darryl RenshawNeelesh Nundkumar, MD;  [facility];  Service: Radiology;  Laterality: N/A;    Current Outpatient Prescriptions  Medication Sig Dispense Refill  . FeFum-FePoly-FA-B Cmp-C-Biot (INTEGRA PLUS) CAPS Take 1 capsule by mouth 2 (two) times daily.     Marland Kitchen. KLOR-CON M20 20 MEQ tablet TAKE 1 TABLET BY MOUTH AT BEDTIME 30 tablet 2  . lamoTRIgine (LAMICTAL) 100 MG tablet Take 100 mg by mouth 2 (two) times daily. Reported on 08/28/2015    . lamoTRIgine (LAMICTAL) 25 MG tablet Take 1 tablet by mouth 2  (two) times daily. Taken with 100 mg for total 125 mg bid    . LORazepam (ATIVAN) 0.5 MG tablet Take 1 tablet by mouth 2 (two) times daily.    . metFORMIN (GLUCOPHAGE-XR) 500 MG 24 hr tablet Take 3 tablets by mouth at bedtime.    . rivaroxaban (XARELTO) 20 MG TABS tablet Take 1 tablet (20 mg total) by mouth daily with supper. 30 tablet 1  . simvastatin (ZOCOR) 20 MG tablet Take 1 tablet (20 mg total) by mouth daily at 6 PM. 30 tablet 1  . Vitamin D, Ergocalciferol, (DRISDOL) 50000 UNITS CAPS capsule Take 50,000 Units by mouth once a week. On Sundays  5   No current facility-administered medications for this visit.     No Known Allergies    Review of Systems negative except from HPI and PMH  Physical Exam BP 132/70   Pulse 67   Ht 5\' 11"  (1.803 m)   Wt 182 lb 9.6 oz (82.8 kg)   SpO2 98%   BMI 25.47 kg/m  Well developed and well nourished in no acute distress HENT normal E scleral and icterus clear Neck Supple JVP flat; carotids brisk and full Clear to ausculation Irregul r rate and rhythm, no murmurs gallops or rub Soft with active bowel sounds No clubbing cyanosis  Edema Alert and oriented, grossly normal motor and sensory function Skin Warm and Dry  ECG demonstrates sinus rhythm at 65 Intervals 15/09/99 ECG from 10/17 from the  outside records was reviewed demonstrating PVCs 2 out of 10 beats    Assessment and  Plan  PVCs dominant morphology indeterminate axis  Seizure disorder? Related to subarachnoid hemorrhage   functional bradycardia  DVT-popliteal   His bradycardia seems to be related to the PVCs. I agree with the discontinuation of the beta blocker. The question has been raised as to whether there is an interplay between his seizure disorder and his PVCs. There are recent papers identifying the presence of iron channels that are identical in both organs. I am not sure of a more clinical relevance. I will have to explore this.  I wonder if the seizuers are  [facility]  His DVT was secondary presumably to his coma. It is been more than 6 months. His DVT was localized to the popliteal veins. We will discontinue his Xarelto           Current medicines are reviewed at length with the patient today .  The patient does not have concerns regarding medicines.

## 2016-05-15 NOTE — Patient Instructions (Signed)
Medication Instructions: Your physician has recommended you make the following change in your medication:  -- 1. STOP Xarelto   Labwork: None Ordered  Procedures/Testing: Your physician has requested that you have an echocardiogram. Echocardiography is a painless test that uses sound waves to create images of your heart. It provides your doctor with information about the size and shape of your heart and how well your heart's chambers and valves are working. This procedure takes approximately one hour. There are no restrictions for this procedure.    Follow-Up: Your physician wants you to follow-up in: 1 YEAR with Dr. Graciela HusbandsKlein. You will receive a reminder letter in the mail two months in advance. If you don't receive a letter, please call our office to schedule the follow-up appointment.    Any Additional Special Instructions Will Be Listed Below (If Applicable).     If you need a refill on your cardiac medications before your next appointment, please call your pharmacy.  2.in

## 2016-05-16 ENCOUNTER — Ambulatory Visit (HOSPITAL_COMMUNITY)
Admission: RE | Admit: 2016-05-16 | Discharge: 2016-05-16 | Disposition: A | Discharge status: Home / Self Care | Payer: BC Managed Care – PPO | Source: Ambulatory Visit | Attending: Neurosurgery | Admitting: Neurosurgery

## 2016-05-16 ENCOUNTER — Other Ambulatory Visit (HOSPITAL_COMMUNITY): Payer: Self-pay | Admitting: Neurosurgery

## 2016-05-16 ENCOUNTER — Encounter (HOSPITAL_COMMUNITY): Payer: Self-pay | Admitting: Neurosurgery

## 2016-05-16 DIAGNOSIS — Z86718 Personal history of other venous thrombosis and embolism: Secondary | ICD-10-CM | POA: Diagnosis not present

## 2016-05-16 DIAGNOSIS — Z7901 Long term (current) use of anticoagulants: Secondary | ICD-10-CM | POA: Diagnosis not present

## 2016-05-16 DIAGNOSIS — E119 Type 2 diabetes mellitus without complications: Secondary | ICD-10-CM | POA: Diagnosis not present

## 2016-05-16 DIAGNOSIS — Z87891 Personal history of nicotine dependence: Secondary | ICD-10-CM | POA: Diagnosis not present

## 2016-05-16 DIAGNOSIS — I609 Nontraumatic subarachnoid hemorrhage, unspecified: Secondary | ICD-10-CM

## 2016-05-16 DIAGNOSIS — Z8679 Personal history of other diseases of the circulatory system: Secondary | ICD-10-CM | POA: Diagnosis not present

## 2016-05-16 DIAGNOSIS — Z48812 Encounter for surgical aftercare following surgery on the circulatory system: Secondary | ICD-10-CM | POA: Diagnosis not present

## 2016-05-16 DIAGNOSIS — R569 Unspecified convulsions: Secondary | ICD-10-CM | POA: Diagnosis not present

## 2016-05-16 DIAGNOSIS — I10 Essential (primary) hypertension: Secondary | ICD-10-CM | POA: Diagnosis not present

## 2016-05-16 HISTORY — PX: IR GENERIC HISTORICAL: IMG1180011

## 2016-05-16 LAB — URINALYSIS, ROUTINE W REFLEX MICROSCOPIC
Bacteria, UA: NONE SEEN
Bilirubin Urine: NEGATIVE
Glucose, UA: NEGATIVE mg/dL
Ketones, ur: NEGATIVE mg/dL
Leukocytes, UA: NEGATIVE
Nitrite: NEGATIVE
Protein, ur: 30 mg/dL — AB
Specific Gravity, Urine: 1.014 (ref 1.005–1.030)
Squamous Epithelial / LPF: NONE SEEN
pH: 6 (ref 5.0–8.0)

## 2016-05-16 LAB — BASIC METABOLIC PANEL
Anion gap: 7 (ref 5–15)
BUN: 14 mg/dL (ref 6–20)
CO2: 29 mmol/L (ref 22–32)
Calcium: 9.5 mg/dL (ref 8.9–10.3)
Chloride: 107 mmol/L (ref 101–111)
Creatinine, Ser: 1.12 mg/dL (ref 0.61–1.24)
GFR calc Af Amer: >60 mL/min (ref >60)
GFR calc non Af Amer: >60 mL/min (ref >60)
Glucose, Bld: 142 mg/dL — ABNORMAL HIGH (ref 65–99)
Potassium: 4.1 mmol/L (ref 3.5–5.1)
Sodium: 143 mmol/L (ref 135–145)

## 2016-05-16 LAB — CBC WITH DIFFERENTIAL/PLATELET
Basophils Absolute: 0 10*3/uL (ref 0.0–0.1)
Basophils Relative: 0 %
Eosinophils Absolute: 0.1 10*3/uL (ref 0.0–0.7)
Eosinophils Relative: 2 %
HCT: 42.6 % (ref 39.0–52.0)
Hemoglobin: 14.3 g/dL (ref 13.0–17.0)
Lymphocytes Relative: 32 %
Lymphs Abs: 1.9 10*3/uL (ref 0.7–4.0)
MCH: 25.4 pg — ABNORMAL LOW (ref 26.0–34.0)
MCHC: 33.6 g/dL (ref 30.0–36.0)
MCV: 75.8 fL — ABNORMAL LOW (ref 78.0–100.0)
Monocytes Absolute: 0.4 10*3/uL (ref 0.1–1.0)
Monocytes Relative: 6 %
Neutro Abs: 3.4 10*3/uL (ref 1.7–7.7)
Neutrophils Relative %: 60 %
Platelets: 244 10*3/uL (ref 150–400)
RBC: 5.62 MIL/uL (ref 4.22–5.81)
RDW: 14.5 % (ref 11.5–15.5)
WBC: 5.8 10*3/uL (ref 4.0–10.5)

## 2016-05-16 LAB — PROTIME-INR
INR: 1
Prothrombin Time: 13.2 seconds (ref 11.4–15.2)

## 2016-05-16 LAB — APTT: aPTT: 32 seconds (ref 24–36)

## 2016-05-16 MED ORDER — MIDAZOLAM HCL 2 MG/2ML IJ SOLN
INTRAMUSCULAR | Status: AC | PRN
  Administered 2016-05-16: 1 mg via INTRAVENOUS

## 2016-05-16 MED ORDER — CHLORHEXIDINE GLUCONATE CLOTH 2 % EX PADS: 6.0000 | MEDICATED_PAD | Freq: Once | CUTANEOUS | Status: DC

## 2016-05-16 MED ORDER — LIDOCAINE HCL 1 % IJ SOLN
INTRAMUSCULAR | Status: AC | PRN
  Administered 2016-05-16: 10 mL

## 2016-05-16 MED ORDER — SODIUM CHLORIDE 0.9 % IV SOLN
INTRAVENOUS | Status: AC | PRN
  Administered 2016-05-16: 10 mL/h via INTRAVENOUS

## 2016-05-16 MED ORDER — HEPARIN SODIUM (PORCINE) 1000 UNIT/ML IJ SOLN
INTRAMUSCULAR | Status: AC
  Filled 2016-05-16: qty 2

## 2016-05-16 MED ORDER — SODIUM CHLORIDE 0.9 % IV SOLN: INTRAVENOUS | Status: DC

## 2016-05-16 MED ORDER — HYDROCODONE-ACETAMINOPHEN 5-325 MG PO TABS
ORAL_TABLET | ORAL | Status: AC
  Filled 2016-05-16: qty 1

## 2016-05-16 MED ORDER — FENTANYL CITRATE (PF) 100 MCG/2ML IJ SOLN
INTRAMUSCULAR | Status: AC
  Filled 2016-05-16: qty 2

## 2016-05-16 MED ORDER — FENTANYL CITRATE (PF) 100 MCG/2ML IJ SOLN
INTRAMUSCULAR | Status: AC | PRN
  Administered 2016-05-16: 25 ug via INTRAVENOUS

## 2016-05-16 MED ORDER — HYDROCODONE-ACETAMINOPHEN 5-325 MG PO TABS
1.0000 | ORAL_TABLET | ORAL | Status: DC | PRN
  Administered 2016-05-16: 1 via ORAL

## 2016-05-16 MED ORDER — LIDOCAINE HCL 1 % IJ SOLN
INTRAMUSCULAR | Status: AC
  Filled 2016-05-16: qty 20

## 2016-05-16 MED ORDER — MIDAZOLAM HCL 2 MG/2ML IJ SOLN
INTRAMUSCULAR | Status: AC
  Filled 2016-05-16: qty 2

## 2016-05-16 MED ORDER — IOPAMIDOL (ISOVUE-300) INJECTION 61%
INTRAVENOUS | Status: AC
  Administered 2016-05-16: 50 mL
  Filled 2016-05-16: qty 100

## 2016-05-16 NOTE — Progress Notes (Signed)
C/O 5/10 pain rt groin area. Site level 0. Soft to touch/ no bruising present/ no bleeding noted.

## 2016-05-16 NOTE — H&P (Signed)
CC:  History of aneurysm rupture  HPI: Darryl [name] is a 65 y.o. 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  He has made an excellent recovery.  He presents today for routine one year follow-up diagnostic cerebral angiogram.  PMH: Past Medical History:  Diagnosis Date  . Acute deep vein thrombosis (DVT) of left femoral vein (HCC) 07/01/2015  . Diabetes mellitus without complication (HCC)   . Hypertension   . Seizures (HCC)     PSH: Past Surgical History:  Procedure Laterality Date  . CHOLECYSTECTOMY    . KNEE SURGERY    . RADIOLOGY WITH ANESTHESIA N/A 06/11/2015   Procedure: RADIOLOGY WITH ANESTHESIA;  Surgeon: Lisbeth RenshawNeelesh Luvina Poirier, MD;  Location: [facility] OR;  Service: Radiology;  Laterality: N/A;    SH: Social History  Substance Use Topics  . Smoking status: Former Smoker    Quit date: 06/02/1988  . Smokeless tobacco: Never Used  . Alcohol use No    MEDS: Prior to Admission medications   Medication Sig Start Date End Date Taking? Authorizing Provider  Fe Fum-FePoly-Vit C-Vit B3 (INTEGRA PO) Take 1 capsule by mouth 2 (two) times daily.   Yes Historical Provider, MD  KLOR-CON M20 20 MEQ tablet TAKE 1 TABLET BY MOUTH AT BEDTIME 10/22/15  Yes Ranelle OysterZachary T Swartz, MD  lamoTRIgine (LAMICTAL) 100 MG tablet Take 100 mg by mouth 2 (two) times daily. Reported on 08/28/2015   Yes Historical Provider, MD  lamoTRIgine (LAMICTAL) 25 MG tablet Take 25 mg by mouth 2 (two) times daily. Taken with 100 mg for total 125 mg bid 08/16/15  Yes Historical Provider, MD  simvastatin (ZOCOR) 20 MG tablet Take 1 tablet (20 mg total) by mouth daily at 6 PM. 10/08/15  Yes Ranelle OysterZachary T Swartz, MD  XARELTO 20 MG TABS tablet Take 20 mg by mouth daily. 04/22/16  Yes Historical Provider, MD  Vitamin D, Ergocalciferol, (DRISDOL) 50000 UNITS CAPS capsule Take 50,000 Units by mouth every Sunday.  05/05/15   Historical Provider, MD    ALLERGY: No Known  Allergies  ROS: ROS  NEUROLOGIC EXAM: Awake, alert, oriented Memory and concentration grossly intact Speech fluent, appropriate CN grossly intact Motor exam: Upper Extremities Deltoid Bicep Tricep Grip  Right 5/5 5/5 5/5 5/5  Left 5/5 5/5 5/5 5/5   Lower Extremity IP Quad PF DF EHL  Right 5/5 5/5 5/5 5/5 5/5  Left 5/5 5/5 5/5 5/5 5/5   Sensation grossly intact to LT  IMPRESSION: - 65 y.o. 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  He is neurologically well.  PLAN: - proceed with follow-up diagnostic cerebral angiogram  I have reviewed the indications for the angiogram, as well as risks, benefits, and alternatives in the office. All questions were answered.

## 2016-05-16 NOTE — Sedation Documentation (Signed)
5 Fr. Exoseal to right groin 

## 2016-05-16 NOTE — Discharge Instructions (Signed)
Femoral Site Care °Introduction °Refer to this sheet in the next few weeks. These instructions provide you with information about caring for yourself after your procedure. Your health care provider may also give you more specific instructions. Your treatment has been planned according to current medical practices, but problems sometimes occur. Call your health care provider if you have any problems or questions after your procedure. °What can I expect after the procedure? °After your procedure, it is typical to have the following: °· Bruising at the site that usually fades within 1-2 weeks. °· Blood collecting in the tissue (hematoma) that may be painful to the touch. It should usually decrease in size and tenderness within 1-2 weeks. °Follow these instructions at home: °· Take medicines only as directed by your health care provider. °· You may shower 24-48 hours after the procedure or as directed by your health care provider. Remove the bandage (dressing) and gently wash the site with plain soap and water. Pat the area dry with a clean towel. Do not rub the site, because this may cause bleeding. °· Do not take baths, swim, or use a hot tub until your health care provider approves. °· Check your insertion site every day for redness, swelling, or drainage. °· Do not apply powder or lotion to the site. °· Limit use of stairs to twice a day for the first 2-3 days or as directed by your health care provider. °· Do not squat for the first 2-3 days or as directed by your health care provider. °· Do not lift over 10 lb (4.5 kg) for 5 days after your procedure or as directed by your health care provider. °· Ask your health care provider when it is okay to: °¨ Return to work or school. °¨ Resume usual physical activities or sports. °¨ Resume sexual activity. °· Do not drive home if you are discharged the same day as the procedure. Have someone else drive you. °· You may drive 24 hours after the procedure unless otherwise  instructed by your health care provider. °· Do not operate machinery or power tools for 24 hours after the procedure or as directed by your health care provider. °· If your procedure was done as an outpatient procedure, which means that you went home the same day as your procedure, a responsible adult should be with you for the first 24 hours after you arrive home. °· Keep all follow-up visits as directed by your health care provider. This is important. °Contact a health care provider if: °· You have a fever. °· You have chills. °· You have increased bleeding from the site. Hold pressure on the site. °Get help right away if: °· You have unusual pain at the site. °· You have redness, warmth, or swelling at the site. °· You have drainage (other than a small amount of blood on the dressing) from the site. °· The site is bleeding, and the bleeding does not stop after 30 minutes of holding steady pressure on the site. °· Your leg or foot becomes pale, cool, tingly, or numb. °This information is not intended to replace advice given to you by your health care provider. Make sure you discuss any questions you have with your health care provider. °Document Released: 01/20/2014 Document Revised: 10/25/2015 Document Reviewed: 12/06/2013 °© 2017 Elsevier ° °

## 2016-06-09 ENCOUNTER — Ambulatory Visit (HOSPITAL_COMMUNITY): Payer: BC Managed Care – PPO | Attending: Cardiology

## 2016-06-09 ENCOUNTER — Other Ambulatory Visit: Payer: Self-pay

## 2016-06-09 DIAGNOSIS — I493 Ventricular premature depolarization: Secondary | ICD-10-CM

## 2016-06-09 DIAGNOSIS — I517 Cardiomegaly: Secondary | ICD-10-CM | POA: Insufficient documentation

## 2016-06-17 ENCOUNTER — Telehealth: Payer: Self-pay

## 2016-06-17 NOTE — Telephone Encounter (Signed)
Pt is aware of normal results. Patient still had several question and concerns:  1) Pt states "Sometimes when I wake up in the morning I notice that my heart rate is between 40-45. Is that something that I should be concerned about" - He informed me that me that SOMETIMES he is symptomatic with bradycardia. Pt also states "once I get up and get moving I feel better."  2) Pt states "The last couple times I have seen my neurologists she has noticed that my skipped beats (PVC's) are happening more frequently and are increasing. Is that something I should be concerned about" 3) Pt states" I have also noticed that when my anxiety and my stress levels are high I am having that chest pain around my heart again. I know that it is onset by stress. Is that something I should be concerned about"  I informed patient that I would forward all questions to Dr. Graciela HusbandsKlein and if Dr. Graciela HusbandsKlein felt there needed to be a change in therapy someone from our office would contact him. I also advised him to follow up with is PCP about his stress and anxiety induced chest pain to see If he wanted to explore any treatment options. I also told the patient that if his chest pain persisted and he couldn't get it to "go away on its on like it usually does" - per the patient then to go to ED. He is agreeable to all plans.

## 2016-06-25 ENCOUNTER — Encounter: Payer: Self-pay | Admitting: Internal Medicine

## 2016-06-25 NOTE — Progress Notes (Unsigned)
Spoke with pt on phone re PVCs and functional bradycardia Echo was normal so we discussed drug therapy for PVCs realizing we could not use BB 2/2 previously identified bradycardia ( HR 40s) He would prefer to avoid more meds if possible  We also discussed chest pain, duration 30-60 min, freq 1-2 week provoked by stress and not be exertion with neg myoview 12/16;  Suspect GERD and recommended OTC PPI for 1 month  Will arrange EP-APP f/u in about 2-3 months

## 2016-07-08 ENCOUNTER — Encounter (HOSPITAL_BASED_OUTPATIENT_CLINIC_OR_DEPARTMENT_OTHER): Payer: Self-pay

## 2016-07-08 DIAGNOSIS — R569 Unspecified convulsions: Secondary | ICD-10-CM

## 2016-07-22 ENCOUNTER — Ambulatory Visit (INDEPENDENT_AMBULATORY_CARE_PROVIDER_SITE_OTHER): Payer: BC Managed Care – PPO | Admitting: Podiatry

## 2016-07-22 ENCOUNTER — Encounter: Payer: Self-pay | Admitting: Podiatry

## 2016-07-22 VITALS — BP 122/59 | HR 49 | Ht 70.5 in | Wt 175.0 lb

## 2016-07-22 DIAGNOSIS — S90222A Contusion of left lesser toe(s) with damage to nail, initial encounter: Secondary | ICD-10-CM

## 2016-07-22 DIAGNOSIS — M722 Plantar fascial fibromatosis: Secondary | ICD-10-CM | POA: Diagnosis not present

## 2016-07-22 NOTE — Progress Notes (Signed)
   Subjective:    Patient ID: Darryl [name], male    DOB: [birth year], 66 y.o.   MRN: [id number]  HPI    Review of Systems     Objective:   Physical Exam        Assessment & Plan:

## 2016-07-22 NOTE — Progress Notes (Signed)
   Subjective:    Patient ID: Darryl [name], male    DOB: 1951-03-05, 66 y.o.   MRN: [id number]  HPI   Patient presents today requesting evaluation of darkened color changes beginning the left hallux toenail in the third toenails, bilaterally. Patient noticed these changes approximate a month ago but working outside doing yard work wearing old shoes. Denies any history of drainage, warmth, or pus formation in these areas. He denies any pain in these areas. He mentions occasional plantar pain on the left foot for approximately a month with standing and walking  Patient is a diabetic and denies any history of foot ulceration, claudication or amputation  Patient is a former smoker discontinued 1990  Review of Systems     Objective:   Physical Exam  Orientated 3  Vascular: DP and PT pulses 2/4 bilaterally Capillary reflex immediate bilaterally  Neurological: Sensation to 10 g monofilament wire intact 5/5 bilaterally Ankle reflex reactive bilaterally Ankle reflex equal and reactive bilaterally  Dermatological: No open skin lesions bilaterally Dry blood beneath the left hallux. There is no surrounding erythema, edema, drainage. Debridement of the distal edge of the nail reveals dried blood with visibility of nailbed without pigmentation Distal third toes bilaterally Dried blood in debrided again demonstrate dried blood with visibility of nailbeds without pigmentation. Slight bleeding from the distal left toe. 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 Patient informed of these findings. Start the patient apply topical antibiotic ointment and a Band-Aid daily to the third left toe until a scab forms Discussed replacement of old work shoes and fasciitis in general   Reappoint as needed or yearly

## 2016-07-22 NOTE — Patient Instructions (Signed)
There is evidence of old dried blood beneath the left great toenail and third toes right and left feet Trimming the third left toenail created slight bleeding. Removed Band-Aid on third left toe in 1-3 days and apply topical antibiotic ointment and a Band-Aid until a scab forms   Plantar Fasciitis Plantar fasciitis is a painful foot condition that affects the heel. It occurs when the band of tissue that connects the toes to the heel bone (plantar fascia) becomes irritated. This can happen after exercising too much or doing other repetitive activities (overuse injury). The pain from plantar fasciitis can range from mild irritation to severe pain that makes it difficult for you to walk or move. The pain is usually worse in the morning or after you have been sitting or lying down for a while. CAUSES This condition may be caused by:  Standing for long periods of time.  Wearing shoes that do not fit.  Doing high-impact activities, including running, aerobics, and ballet.  Being overweight.  Having an abnormal way of walking (gait).  Having tight calf muscles.  Having high arches in your feet.  Starting a new athletic activity. SYMPTOMS The main symptom of this condition is heel pain. Other symptoms include:  Pain that gets worse after activity or exercise.  Pain that is worse in the morning or after resting.  Pain that goes away after you walk for a few minutes. DIAGNOSIS This condition may be diagnosed based on your signs and symptoms. Your health care provider will also do a physical exam to check for:  A tender area on the bottom of your foot.  A high arch in your foot.  Pain when you move your foot.  Difficulty moving your foot. You may also need to have imaging studies to confirm the diagnosis. These can include:  X-rays.  Ultrasound.  MRI. TREATMENT  Treatment for plantar fasciitis depends on the severity of the condition. Your treatment may include:  Rest, ice,  and over-the-counter pain medicines to manage your pain.  Exercises to stretch your calves and your plantar fascia.  A splint that holds your foot in a stretched, upward position while you sleep (night splint).  Physical therapy to relieve symptoms and prevent problems in the future.  Cortisone injections to relieve severe pain.  Extracorporeal shock wave therapy (ESWT) to stimulate damaged plantar fascia with electrical impulses. It is often used as a last resort before surgery.  Surgery, if other treatments have not worked after 12 months. HOME CARE INSTRUCTIONS  Take medicines only as directed by your health care provider.  Avoid activities that cause pain.  Roll the bottom of your foot over a bag of ice or a bottle of cold water. Do this for 20 minutes, 3-4 times a day.  Perform simple stretches as directed by your health care provider.  Try wearing athletic shoes with air-sole or gel-sole cushions or soft shoe inserts.  Wear a night splint while sleeping, if directed by your health care provider.  Keep all follow-up appointments with your health care provider. PREVENTION   Do not perform exercises or activities that cause heel pain.  Consider finding low-impact activities if you continue to have problems.  Lose weight if you need to. The best way to prevent plantar fasciitis is to avoid the activities that aggravate your plantar fascia. SEEK MEDICAL CARE IF:  Your symptoms do not go away after treatment with home care measures.  Your pain gets worse.  Your pain affects your ability  to move or do your daily activities. This information is not intended to replace advice given to you by your health care provider. Make sure you discuss any questions you have with your health care provider. Document Released: 02/11/2001 Document Revised: 09/10/2015 Document Reviewed: 03/29/2014 Elsevier Interactive Patient Education  2017 Elsevier Inc[employer] Diabetes and Foot Care Diabetes  may cause you to have problems because of poor blood supply (circulation) to your feet and legs. This may cause the skin on your feet to become thinner, break easier, and heal more slowly. Your skin may become dry, and the skin may peel and crack. You may also have nerve damage in your legs and feet causing decreased feeling in them. You may not notice minor injuries to your feet that could lead to infections or more serious problems. Taking care of your feet is one of the most important things you can do for yourself. Follow these instructions at home:  Wear shoes at all times, even in the house. Do not go barefoot. Bare feet are easily injured.  Check your feet daily for blisters, cuts, and redness. If you cannot see the bottom of your feet, use a mirror or ask someone for help.  Wash your feet with warm water (do not use hot water) and mild soap. Then pat your feet and the areas between your toes until they are completely dry. Do not soak your feet as this can dry your skin.  Apply a moisturizing lotion or petroleum jelly (that does not contain alcohol and is unscented) to the skin on your feet and to dry, brittle toenails. Do not apply lotion between your toes.  Trim your toenails straight across. Do not dig under them or around the cuticle. File the edges of your nails with an emery board or nail file.  Do not cut corns or calluses or try to remove them with medicine.  Wear clean socks or stockings every day. Make sure they are not too tight. Do not wear knee-high stockings since they may decrease blood flow to your legs.  Wear shoes that fit properly and have enough cushioning. To break in new shoes, wear them for just a few hours a day. This prevents you from injuring your feet. Always look in your shoes before you put them on to be sure there are no objects inside.  Do not cross your legs. This may decrease the blood flow to your feet.  If you find a minor scrape, cut, or break in the  skin on your feet, keep it and the skin around it clean and dry. These areas may be cleansed with mild soap and water. Do not cleanse the area with peroxide, alcohol, or iodine.  When you remove an adhesive bandage, be sure not to damage the skin around it.  If you have a wound, look at it several times a day to make sure it is healing.  Do not use heating pads or hot water bottles. They may burn your skin. If you have lost feeling in your feet or legs, you may not know it is happening until it is too late.  Make sure your health care provider performs a complete foot exam at least annually or more often if you have foot problems. Report any cuts, sores, or bruises to your health care provider immediately. Contact a health care provider if:  You have an injury that is not healing.  You have cuts or breaks in the skin.  You have an ingrown  nail.  You notice redness on your legs or feet.  You feel burning or tingling in your legs or feet.  You have pain or cramps in your legs and feet.  Your legs or feet are numb.  Your feet always feel cold. Get help right away if:  There is increasing redness, swelling, or pain in or around a wound.  There is a red line that goes up your leg.  Pus is coming from a wound.  You develop a fever or as directed by your health care provider.  You notice a bad smell coming from an ulcer or wound. This information is not intended to replace advice given to you by your health care provider. Make sure you discuss any questions you have with your health care provider. Document Released: 05/16/2000 Document Revised: 10/25/2015 Document Reviewed: 10/26/2012 Elsevier Interactive Patient Education  2017 [employer].

## 2016-08-21 ENCOUNTER — Ambulatory Visit (HOSPITAL_BASED_OUTPATIENT_CLINIC_OR_DEPARTMENT_OTHER): Payer: BC Managed Care – PPO | Attending: Family Medicine | Admitting: Internal Medicine

## 2016-08-21 DIAGNOSIS — G479 Sleep disorder, unspecified: Secondary | ICD-10-CM | POA: Diagnosis not present

## 2016-08-21 DIAGNOSIS — G4089 Other seizures: Secondary | ICD-10-CM | POA: Diagnosis not present

## 2016-08-21 DIAGNOSIS — R569 Unspecified convulsions: Secondary | ICD-10-CM

## 2016-08-24 DIAGNOSIS — R569 Unspecified convulsions: Secondary | ICD-10-CM | POA: Diagnosis not present

## 2016-08-24 NOTE — Procedures (Signed)
  Patient Name: Darryl [name], Geovanny Study Date: 08/21/2016 Gender: Male D.O.B: August 03, 1950 Age (years): 65 Referring Provider: [name] Height (inches): 71 Interpreting Physician: Jetty Duhamellinton Carren Blakley MD, ABSM Weight (lbs): 176 RPSGT: Rolene ArbourMcConnico, Yvonne BMI: 25 MRN: [id number] Neck Size: 14.00 CLINICAL INFORMATION Sleep Study Type: NPSG  Indication for sleep study: Excessive Daytime Sleepiness, Snoring  Epworth Sleepiness Score: 9  SLEEP STUDY TECHNIQUE As per the AASM Manual for the Scoring of Sleep and Associated Events v2.3 (April 2016) with a hypopnea requiring 4% desaturations.  The channels recorded and monitored were frontal, central and occipital EEG, electrooculogram (EOG), submentalis EMG (chin), nasal and oral airflow, thoracic and abdominal wall motion, anterior tibialis EMG, snore microphone, electrocardiogram, and pulse oximetry.  MEDICATIONS Medications self-administered by patient taken the night of the study : Home medications taken before arrival at [facility].  SLEEP ARCHITECTURE The study was initiated at 10:16:12 PM and ended at 4:32:43 AM.  Sleep onset time was 2.9 minutes and the sleep efficiency was 98.5%. The total sleep time was 371.0 minutes.  Stage REM latency was 119.0 minutes.  The patient spent 0.94% of the night in stage N1 sleep, 88.95% in stage N2 sleep, 0.00% in stage N3 and 10.11% in REM.  Alpha intrusion was absent.  Supine sleep was 100.00%.  RESPIRATORY PARAMETERS The overall apnea/hypopnea index (AHI) was 2.1 per hour. There were 6 total apneas, including 4 obstructive, 2 central and 0 mixed apneas. There were 7 hypopneas and 9 RERAs.  The AHI during Stage REM sleep was 3.2 per hour.  AHI while supine was 2.1 per hour.  The mean oxygen saturation was 96.64%. The minimum SpO2 during sleep was 93.00%.  Soft snoring was noted during this study.  CARDIAC DATA The 2 lead EKG demonstrated sinus rhythm. The mean heart rate was 68.54 beats  per minute. Other EKG findings include: PVCs.  LEG MOVEMENT DATA The total PLMS were 0 with a resulting PLMS index of 0.00. Associated arousal with leg movement index was 0.0 .  IMPRESSIONS - No significant obstructive sleep apnea occurred during this study (AHI = 2.1/h). - No significant central sleep apnea occurred during this study (CAI = 0.3/h). - The patient snored with Soft snoring volume. - EKG findings include very frequent PVCs. - Clinically significant periodic limb movements did not occur during sleep. No significant associated arousals. - Sleep architecture consistent with medication effect- unusually high percentage of time spent in stage N2 sleep. Medications taken before arrival were not reported.  DIAGNOSIS - Other sleep disorder  RECOMMENDATIONS - Be careful with alcohol, sedatives and other CNS depressants that may worsen sleep apnea and disrupt normal sleep architecture. - Sleep hygiene should be reviewed to assess factors that may improve sleep quality. - Weight management and regular exercise should be initiated or continued if appropriate.  [Electronically signed] 08/24/2016 12:42 PM  Jetty Duhamellinton Chanse Kagel MD, ABSM Diplomate, American Board of Sleep Medicine   NPI: [id number]  Waymon BudgeYOUNG,Joleene Burnham D Diplomate, American Board of Sleep Medicine  ELECTRONICALLY SIGNED ON:  08/24/2016, 12:36 PM [facility] PH: [phone]   FX: [phone] ACCREDITED BY THE AMERICAN ACADEMY OF SLEEP MEDICINE

## 2016-08-29 ENCOUNTER — Ambulatory Visit: Payer: BC Managed Care – PPO | Admitting: Internal Medicine

## 2016-08-30 DIAGNOSIS — R569 Unspecified convulsions: Secondary | ICD-10-CM | POA: Diagnosis not present

## 2016-09-05 ENCOUNTER — Encounter: Payer: Self-pay | Admitting: Internal Medicine

## 2017-01-21 ENCOUNTER — Ambulatory Visit: Payer: BC Managed Care – PPO | Admitting: Neurology

## 2017-01-22 ENCOUNTER — Encounter: Payer: Self-pay | Admitting: Neurology

## 2017-05-04 IMAGING — NM NM MISC PROCEDURE
3 series · 18 of 18 positions shown · non-contrast
Comparison: none

[Series 1: stress-gsp_(id)_sa · 6.4mm · 6.40mm/px · 6 of 512 frames shown]
[frame 43/512]
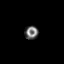
[frame 128/512]
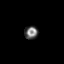
[frame 214/512]
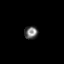
[frame 299/512]
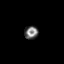
[frame 384/512]
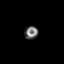
[frame 470/512]
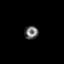

[Series 1: stress-sum-em_(id)_sa · 6.4mm · 6.40mm/px · 6 of 64 frames shown]
[frame 6/64]
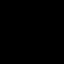
[frame 16/64]
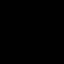
[frame 27/64]
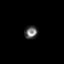
[frame 38/64]
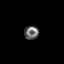
[frame 48/64]
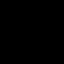
[frame 59/64]
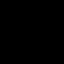

[Series 1: rest_(id)_sa · 6.4mm · 6.40mm/px · 6 of 64 frames shown]
[frame 6/64]
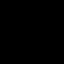
[frame 16/64]
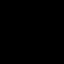
[frame 27/64]
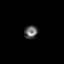
[frame 38/64]
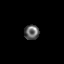
[frame 48/64]
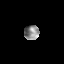
[frame 59/64]
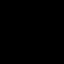

[18 of 18 positions shown; findings below may reference images not displayed]

Canned report from images found in remote index.

Refer to host system for actual result text.

## 2017-07-14 ENCOUNTER — Encounter (HOSPITAL_COMMUNITY): Payer: Self-pay | Admitting: Family Medicine

## 2017-07-14 ENCOUNTER — Ambulatory Visit (INDEPENDENT_AMBULATORY_CARE_PROVIDER_SITE_OTHER): Payer: BC Managed Care – PPO

## 2017-07-14 ENCOUNTER — Ambulatory Visit (HOSPITAL_COMMUNITY)
Admission: EM | Admit: 2017-07-14 | Discharge: 2017-07-14 | Disposition: A | Discharge status: Home / Self Care | Payer: BC Managed Care – PPO | Attending: Emergency Medicine | Admitting: Emergency Medicine

## 2017-07-14 DIAGNOSIS — R509 Fever, unspecified: Secondary | ICD-10-CM | POA: Diagnosis not present

## 2017-07-14 DIAGNOSIS — R7989 Other specified abnormal findings of blood chemistry: Secondary | ICD-10-CM

## 2017-07-14 DIAGNOSIS — R05 Cough: Secondary | ICD-10-CM

## 2017-07-14 DIAGNOSIS — R0789 Other chest pain: Secondary | ICD-10-CM | POA: Diagnosis not present

## 2017-07-14 DIAGNOSIS — R059 Cough, unspecified: Secondary | ICD-10-CM

## 2017-07-14 LAB — POCT I-STAT, CHEM 8
BUN: 23 mg/dL — ABNORMAL HIGH (ref 6–20)
Calcium, Ion: 1.16 mmol/L (ref 1.15–1.40)
Chloride: 101 mmol/L (ref 101–111)
Creatinine, Ser: 1.4 mg/dL — ABNORMAL HIGH (ref 0.61–1.24)
Glucose, Bld: 145 mg/dL — ABNORMAL HIGH (ref 65–99)
HCT: 50 % (ref 39.0–52.0)
Hemoglobin: 17 g/dL (ref 13.0–17.0)
Potassium: 3.8 mmol/L (ref 3.5–5.1)
Sodium: 140 mmol/L (ref 135–145)
TCO2: 29 mmol/L (ref 22–32)

## 2017-07-14 MED ORDER — AEROCHAMBER PLUS MISC: 2 refills | Status: DC

## 2017-07-14 MED ORDER — IPRATROPIUM-ALBUTEROL 0.5-2.5 (3) MG/3ML IN SOLN
3.0000 mL | Freq: Once | RESPIRATORY_TRACT | Status: AC
  Administered 2017-07-14: 3 mL via RESPIRATORY_TRACT

## 2017-07-14 MED ORDER — LIDOCAINE HCL (PF) 1 % IJ SOLN
INTRAMUSCULAR | Status: AC
  Filled 2017-07-14: qty 2

## 2017-07-14 MED ORDER — ALBUTEROL SULFATE HFA 108 (90 BASE) MCG/ACT IN AERS: 2.0000 | INHALATION_SPRAY | RESPIRATORY_TRACT | 0 refills | Status: DC | PRN

## 2017-07-14 MED ORDER — IPRATROPIUM-ALBUTEROL 0.5-2.5 (3) MG/3ML IN SOLN
RESPIRATORY_TRACT | Status: AC
  Filled 2017-07-14: qty 3

## 2017-07-14 MED ORDER — CEFTRIAXONE SODIUM 1 G IJ SOLR
1.0000 g | Freq: Once | INTRAMUSCULAR | Status: AC
  Administered 2017-07-14: 1 g via INTRAMUSCULAR

## 2017-07-14 MED ORDER — AZITHROMYCIN 250 MG PO TABS: 250.0000 mg | ORAL_TABLET | Freq: Every day | ORAL | 0 refills | Status: DC

## 2017-07-14 MED ORDER — CEFTRIAXONE SODIUM 1 G IJ SOLR
INTRAMUSCULAR | Status: AC
  Filled 2017-07-14: qty 10

## 2017-07-14 NOTE — ED Provider Notes (Signed)
HPI  SUBJECTIVE:  Darryl [name] is a 67 y.o. male who presents with a "URI" for the past 6 days.  Reports nasal congestion and cough that was initially nonproductive up until today when he states that he started coughing up yellow phlegm.  Reports fevers to 102.  States that he has been doing a lot of coughing.  Reports wheezing, shortness of breath, dyspnea on exertion, fatigue decreased appetite.  States that he has "lost a little weight".  Also reports substernal constant sore tender nonradiating chest pain starting this morning that he has had before with previous chest colds.  He denies nausea, diaphoresis, chest pressure or heaviness.  There is no exertional component to it.  There are no aggravating or alleviating factors.  He has not tried anything for this chest pain. No admissions in the past 3 months, no antibiotics in the past 3 months, no antipyretic in the past 6-8 hours.  He has tried NyQuil and Vicks VapoRub with some improvement in his URI symptoms.  No aggravating factors.  He has a past medical history of hypertension, diabetes-does not check his sugar regularly, SAH, DVT left lower extremity for which he takes Xarelto, PVCs.  No history of asthma, eczema, COPD, smoking, MI, hypercholesterolemia, coronary artery disease, atrial fibrillation.  ZOX:WRUEA, Darryl Saran, MD   Past Medical History:  Diagnosis Date  . Acute deep vein thrombosis (DVT) of left femoral vein (HCC) 07/01/2015  . Diabetes mellitus without complication (HCC)   . Hypertension   . Seizures (HCC)     Past Surgical History:  Procedure Laterality Date  . CHOLECYSTECTOMY    . IR GENERIC HISTORICAL  05/16/2016   IR ANGIO VERTEBRAL SEL VERTEBRAL UNI L MOD SED 05/16/2016 [facility]  . IR GENERIC HISTORICAL  05/16/2016   IR ANGIOGRAM EXTREMITY RIGHT 05/16/2016 [facility]  . IR GENERIC HISTORICAL  05/16/2016   IR ANGIO INTRA EXTRACRAN SEL COM CAROTID INNOMINATE UNI R MOD SED 05/16/2016 [facility]  . KNEE SURGERY     . RADIOLOGY WITH ANESTHESIA N/A 06/11/2015   Procedure: RADIOLOGY WITH ANESTHESIA;  Surgeon: Lisbeth Renshaw, MD;  Location: [facility] OR;  Service: Radiology;  Laterality: N/A;    Family History  Problem Relation Age of Onset  . Alzheimer's disease Mother   . Diabetes Father   . Aneurysm Brother   . Aneurysm Sister     Social History   Tobacco Use  . Smoking status: Former Smoker    Last attempt to quit: 06/02/1988    Years since quitting: 29.1  . Smokeless tobacco: Never Used  Substance Use Topics  . Alcohol use: No  . Drug use: No    No current facility-administered medications for this encounter.   Current Outpatient Medications:  .  albuterol (PROVENTIL HFA;VENTOLIN HFA) 108 (90 Base) MCG/ACT inhaler, Inhale 2 puffs into the lungs every 4 (four) hours as needed for wheezing or shortness of breath. Dispense with aerochamber, Disp: 1 Inhaler, Rfl: 0 .  azithromycin (ZITHROMAX) 250 MG tablet, Take 1 tablet (250 mg total) by mouth daily. 2 tabs po on day 1, 1 tab po on days 2-5, Disp: 6 tablet, Rfl: 0 .  CIALIS 5 MG tablet, , Disp: , Rfl:  .  Fe Fum-FePoly-Vit C-Vit B3 (INTEGRA PO), Take 1 capsule by mouth 2 (two) times daily., Disp: , Rfl:  .  KLOR-CON M20 20 MEQ tablet, TAKE 1 TABLET BY MOUTH AT BEDTIME, Disp: 30 tablet, Rfl: 2 .  lamoTRIgine (LAMICTAL) 100 MG tablet, Take 100  mg by mouth 2 (two) times daily. Reported on 08/28/2015, Disp: , Rfl:  .  lamoTRIgine (LAMICTAL) 25 MG tablet, Take 25 mg by mouth 2 (two) times daily. Taken with 100 mg for total 125 mg bid, Disp: , Rfl:  .  Spacer/Aero-Holding Chambers (AEROCHAMBER PLUS) inhaler, Use as instructed, Disp: 1 each, Rfl: 2 .  Vitamin D, Ergocalciferol, (DRISDOL) 50000 UNITS CAPS capsule, Take 50,000 Units by mouth every Sunday. , Disp: , Rfl: 5 .  XARELTO 20 MG TABS tablet, Take 20 mg by mouth daily., Disp: , Rfl: 2  No Known Allergies   ROS  As noted in HPI.   Physical Exam  BP 105/72   Pulse 60   Temp (!) 97.5 F  (36.4 C)   Resp 18   SpO2 100%   Constitutional: Well developed, well nourished, no acute distress Eyes:  EOMI, conjunctiva normal bilaterally HENT: Normocephalic, atraumatic,mucus membranes moist.  Mild nasal congestion.  No sinus tenderness. Respiratory: Normal inspiratory effort, rhonchi right lower lobe, fair air movement.  Positive diffuse chest wall tenderness maximal in the sternal area which she states reproduces his chest pain.  Pain is produced/aggravated with torso rotation. Cardiovascular: Normal rate regular rhythm, no murmurs, rubs, gallops GI: nondistended skin: No rash, skin intact Musculoskeletal: no deformities Neurologic: Alert & oriented x 3, no focal neuro deficits Psychiatric: Speech and behavior appropriate   ED Course   Medications  ipratropium-albuterol (DUONEB) 0.5-2.5 (3) MG/3ML nebulizer solution 3 mL (3 mLs Nebulization Given 07/14/17 1237)  cefTRIAXone (ROCEPHIN) injection 1 g (1 g Intramuscular Given 07/14/17 1338)    Orders Placed This Encounter  Procedures  . DG Chest 2 View    Standing Status:   Standing    Number of Occurrences:   1    Order Specific Question:   Reason for Exam (SYMPTOM  OR DIAGNOSIS REQUIRED)    Answer:   r/o PNA  . I-STAT, chem 8    Standing Status:   Standing    Number of Occurrences:   1    Results for orders placed or performed during the hospital encounter of 07/14/17 (from the past 24 hour(s))  I-STAT, chem 8     Status: Abnormal   Collection Time: 07/14/17  1:03 PM  Result Value Ref Range   Sodium 140 135 - 145 mmol/L   Potassium 3.8 3.5 - 5.1 mmol/L   Chloride 101 101 - 111 mmol/L   BUN 23 (H) 6 - 20 mg/dL   Creatinine, Ser 4.541.40 (H) 0.61 - 1.24 mg/dL   Glucose, Bld 098145 (H) 65 - 99 mg/dL   Calcium, Ion 1.191.16 1.471.15 - 1.40 mmol/L   TCO2 29 22 - 32 mmol/L   Hemoglobin 17.0 13.0 - 17.0 g/dL   HCT 82.950.0 56.239.0 - 13.052.0 %   Dg Chest 2 View  Result Date: 07/14/2017 CLINICAL DATA:  Cough and chest pain EXAM: CHEST  2  VIEW COMPARISON:  07/16/2015 FINDINGS: The heart size and mediastinal contours are within normal limits. Both lungs are clear. The visualized skeletal structures are unremarkable. IMPRESSION: No active cardiopulmonary disease. Electronically Signed   By: Alcide CleverMark  Lukens M.D.   On: 07/14/2017 12:58    ED Clinical Impression  Cough  Fever, unspecified fever cause  Creatinine elevation   ED Assessment/Plan  Suspect that patient has a pneumonia.  Will check a chest x-ray and i-STAT for CURB 65 score. Will reevaluate.  Feel that the chest pain is musculoskeletal from all the coughing that he has  been doing.  It is reproducible and he states that he has had this identical pain before with previous "chest colds".  Doubt cardiac cause.  CURB score 2 due to age and BUN.  However, he has no radiographic evidence of pneumonia, he is not hypoxic, he is in no respiratory distress, I feel that it is reasonable to try outpatient treatment at this time.  He does have a primary care physician with whom he can follow-up with in 2 days.   clinically he has pneumonia, so we will treat him empirically for pneumonia.  Giving 1 g of Rocephin here.  We will send him home with azithromycin for 5 days, albuterol inhaler with a spacer, have him push fluids.  He will need to follow-up with his PMD or here in 2 days for reevaluation.  He will go to the ER if he gets worse.  Reviewed imaging independently.  No pneumonia. see radiology report for full details.  On re-evaluation, patient states that he feels about the same.  He has rhonchi on the right side and rales at the base.  Discussed the possibility of him going to the emergency department today versus attempting outpatient treatment and he has opted for outpatient treatment.  He will follow-up here or see his primary care physician in 2 days to recheck his BUN/creatinine and perhaps recheck a chest x-ray.  Discussed labs, imaging, MDM, plan and followup with patient.  Discussed sn/sx that should prompt return to the ED. patient agrees with plan.   Meds ordered this encounter  Medications  . ipratropium-albuterol (DUONEB) 0.5-2.5 (3) MG/3ML nebulizer solution 3 mL  . cefTRIAXone (ROCEPHIN) injection 1 g  . azithromycin (ZITHROMAX) 250 MG tablet    Sig: Take 1 tablet (250 mg total) by mouth daily. 2 tabs po on day 1, 1 tab po on days 2-5    Dispense:  6 tablet    Refill:  0  . albuterol (PROVENTIL HFA;VENTOLIN HFA) 108 (90 Base) MCG/ACT inhaler    Sig: Inhale 2 puffs into the lungs every 4 (four) hours as needed for wheezing or shortness of breath. Dispense with aerochamber    Dispense:  1 Inhaler    Refill:  0  . Spacer/Aero-Holding Chambers (AEROCHAMBER PLUS) inhaler    Sig: Use as instructed    Dispense:  1 each    Refill:  2    *This clinic note was created using Dragon dictation software. Therefore, there may be occasional mistakes despite careful proofreading.   ?   Domenick Gong, MD 07/14/17 1420

## 2017-07-14 NOTE — Discharge Instructions (Signed)
You do not have a pneumonia on chest x-ray today however, I think that you have one so I am treating empirically as if you do.  Push fluids.  Finish the antibiotics unless a healthcare provider tells you to stop.  2 puffs from your albuterol inhaler using a spacer every 4-6 hours as needed for coughing, wheezing, shortness of breath.  Return here or see your doctor in 2 days for lab recheck.  We may want to recheck an x-ray at that time but I will leave that up to the provider who sees you.

## 2017-07-14 NOTE — ED Triage Notes (Signed)
Pt here for cough, cold, congestion and chest pain since last Wednesday. Reports intermittent fever.

## 2018-06-10 ENCOUNTER — Ambulatory Visit: Payer: Self-pay | Admitting: Podiatry

## 2018-06-11 ENCOUNTER — Ambulatory Visit: Payer: Self-pay | Admitting: Podiatry

## 2018-06-11 ENCOUNTER — Encounter: Payer: Self-pay | Admitting: Podiatry

## 2018-06-11 DIAGNOSIS — S90222A Contusion of left lesser toe(s) with damage to nail, initial encounter: Secondary | ICD-10-CM

## 2018-06-11 DIAGNOSIS — B351 Tinea unguium: Secondary | ICD-10-CM

## 2018-06-11 NOTE — Progress Notes (Signed)
Subjective:   Patient ID: Darryl [name], male   DOB: 67 y.o.   MRN: [id number]   HPI Patient presents concerned about the third nail on the left foot that is been discolored for several months and does not remember history of injury.  Also complains about dryness on his feet   ROS      Objective:  Physical Exam  Neurovascular status intact with patient's third nail being discolored with dark discoloration of the back with mild neuritis of the plantar aspect of both feet     Assessment:  Traumatized left third nail that is localized with mild dryness of the feet that is localized in nature with no pain     Plan:  Probable trauma to the left third nailbed creating discoloration with no looseness of the nail drainage or pain.  I did discuss this with him and I do not recommend treatment unless it were to get worse

## 2018-07-20 ENCOUNTER — Encounter (INDEPENDENT_AMBULATORY_CARE_PROVIDER_SITE_OTHER): Payer: Self-pay | Admitting: Orthopaedic Surgery

## 2018-07-20 ENCOUNTER — Ambulatory Visit (INDEPENDENT_AMBULATORY_CARE_PROVIDER_SITE_OTHER): Payer: Medicare Other | Admitting: Orthopaedic Surgery

## 2018-07-20 ENCOUNTER — Ambulatory Visit (INDEPENDENT_AMBULATORY_CARE_PROVIDER_SITE_OTHER): Payer: Medicare Other

## 2018-07-20 VITALS — BP 141/82 | HR 56 | Ht 70.5 in | Wt 170.0 lb

## 2018-07-20 DIAGNOSIS — G8929 Other chronic pain: Secondary | ICD-10-CM

## 2018-07-20 DIAGNOSIS — M25512 Pain in left shoulder: Secondary | ICD-10-CM

## 2018-07-20 DIAGNOSIS — S90222A Contusion of left lesser toe(s) with damage to nail, initial encounter: Secondary | ICD-10-CM

## 2018-07-20 NOTE — Progress Notes (Addendum)
Office Visit Note   Patient: Darryl [name]           Date of Birth: [birth year]           MRN: [id number] Visit Date: 07/20/2018              Requested by: Renaye Rakers, MD [address] [place], [place] [address] PCP: Renaye Rakers, MD   Assessment & Plan: Visit Diagnoses:  1. Chronic left shoulder pain     Plan: If symptoms get worse he can call about either physical therapy referral or return for subacromial injection.  Pathophysiology discussed.  He has some rotator cuff tendinopathy on plain radiograph.  We discussed workout activities to modify.  No specific treatment needed for the subungual hematoma.  He can keep his nails cut short and transverse to prevent repetitive trauma.  Follow-Up Instructions: No follow-ups on file.   Orders:  Orders Placed This Encounter  Procedures  . XR Shoulder Left   No orders of the defined types were placed in this encounter.     Procedures: No procedures performed   Clinical Data: No additional findings.   Subjective: Chief Complaint  Patient presents with  . Left Shoulder - Pain    HPI 68 year old male returns with recurrent problem with left third toe subungual hematoma recurrent from several years ago without trauma and also problems with left shoulder with outstretch reaching and overhead activities.  He denies specific shoulder injury he does do some weights and stretching activities and noticed his arm is painful with outstretch and overhead positions.  He is right-hand dominant no chills or fever no associated neck pain no numbness or tingling in his hands.  Older discomfort is been present for few years worse in the last few months.  Review of Systems 14 point systems updated positive for type 2 diabetes, hypertension history of subarachnoid hemorrhage, previous left knee meniscal tear otherwise negative is a pertains HPI 14 point systems updated.   Objective: Vital Signs: BP (!) 141/82 (BP Location: Left Arm)    Pulse (!) 56   Ht 5' 10.5" (1.791 m)   Wt 170 lb (77.1 kg)   BMI 24.05 kg/m   Physical Exam Constitutional:      Appearance: He is well-developed.  HENT:     Head: Normocephalic and atraumatic.  Eyes:     Pupils: Pupils are equal, round, and reactive to light.  Neck:     Thyroid: No thyromegaly.     Trachea: No tracheal deviation.  Cardiovascular:     Rate and Rhythm: Normal rate.  Pulmonary:     Effort: Pulmonary effort is normal.     Breath sounds: No wheezing.  Abdominal:     General: Bowel sounds are normal.     Palpations: Abdomen is soft.  Skin:    General: Skin is warm and dry.     Capillary Refill: Capillary refill takes less than 2 seconds.  Neurological:     Mental Status: He is alert and oriented to person, place, and time.  Psychiatric:        Behavior: Behavior normal.        Thought Content: Thought content normal.        Judgment: Judgment normal.     Ortho Exam positive impingement left shoulder negative drop arm test.  Long head of the biceps is normal.  Negative Hawkins.  Negative for adhesive capsulitis.  Abduction 140 with discomfort.  However reaches full extension session and  is intact good biceps triceps strength.  Left third toe subungual hematoma with good capillary refill.  No plantar foot lesions right or left foot.  Specialty Comments:  No specialty comments available.  Imaging: Xr Shoulder Left  Result Date: 07/20/2018 Three-view x-rays left shoulder obtained and reviewed.  This is negative for acute changes.  Small area of supraspinatus calcification adjacent to the greater tuberosity.  Small inferior glenohumeral spurs minimal acromioclavicular degenerative changes. Impression: Calcific tendinopathy rotator cuff.  Otherwise normal findings for his age.    PMFS History: Patient Active Problem List   Diagnosis Date Noted  . 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 Falls   . Acute  deep vein thrombosis (DVT) of left femoral vein (HCC) 07/01/2015  . Pain   . Poor fluid intake   . Essential hypertension   . Type 2 diabetes mellitus with complication, without long-term current use of insulin (HCC)   . Disorientation   . Convulsions (HCC)   . Acute blood loss anemia   . Hypokalemia   . Ileus (HCC)   . Leukocytosis   . Right knee pain   . Cognitive safety issue   . Impulsiveness   . PVC (premature ventricular contraction)   . Abdominal discomfort   . SAH (subarachnoid hemorrhage) (HCC) 06/11/2015  . Emesis   . Subarachnoid hemorrhage (HCC) 06/10/2015  . Hypertension   . Diabetes mellitus without complication [facility])    Past Medical History:  Diagnosis Date  . Acute deep vein thrombosis (DVT) of left femoral vein (HCC) 07/01/2015  . Diabetes mellitus without complication (HCC)   . Hypertension   . Seizures (HCC)     Family History  Problem Relation Age of Onset  . Alzheimer's disease Mother   . Diabetes Father   . Aneurysm Brother   . Aneurysm Sister     Past Surgical History:  Procedure Laterality Date  . CHOLECYSTECTOMY    . IR GENERIC HISTORICAL  05/16/2016   IR ANGIO VERTEBRAL SEL VERTEBRAL UNI L MOD SED 05/16/2016 [facility]  . IR GENERIC HISTORICAL  05/16/2016   IR ANGIOGRAM EXTREMITY RIGHT 05/16/2016 [facility]  . IR GENERIC HISTORICAL  05/16/2016   IR ANGIO INTRA EXTRACRAN SEL COM CAROTID INNOMINATE UNI R MOD SED 05/16/2016 [facility]  . KNEE SURGERY    . RADIOLOGY WITH ANESTHESIA N/A 06/11/2015   Procedure: RADIOLOGY WITH ANESTHESIA;  Surgeon: Lisbeth Renshaw, MD;  Location: [facility] OR;  Service: Radiology;  Laterality: N/A;   Social History   Occupational History  . Not on file  Tobacco Use  . Smoking status: Former Smoker    Last attempt to quit: 06/02/1988    Years since quitting: 30.1  . Smokeless tobacco: Never Used  Substance and Sexual Activity  . Alcohol use: No  . Drug use: No  . Sexual activity: Not on file

## 2018-12-30 ENCOUNTER — Ambulatory Visit (HOSPITAL_COMMUNITY)
Admission: EM | Admit: 2018-12-30 | Discharge: 2018-12-30 | Disposition: A | Discharge status: Home / Self Care | Payer: Medicare Other | Attending: Family Medicine | Admitting: Family Medicine

## 2018-12-30 ENCOUNTER — Encounter (HOSPITAL_COMMUNITY): Payer: Self-pay | Admitting: Emergency Medicine

## 2018-12-30 ENCOUNTER — Ambulatory Visit (INDEPENDENT_AMBULATORY_CARE_PROVIDER_SITE_OTHER): Payer: Medicare Other

## 2018-12-30 ENCOUNTER — Other Ambulatory Visit: Payer: Self-pay

## 2018-12-30 DIAGNOSIS — I10 Essential (primary) hypertension: Secondary | ICD-10-CM

## 2018-12-30 DIAGNOSIS — R0789 Other chest pain: Secondary | ICD-10-CM

## 2018-12-30 DIAGNOSIS — Z9114 Patient's other noncompliance with medication regimen: Secondary | ICD-10-CM | POA: Diagnosis not present

## 2018-12-30 NOTE — Discharge Instructions (Signed)
Your chest x-ray is normal Your EKG does not show any changes except for the irregular heartbeats See your family doctor tomorrow Although your blood pressure is elevated, is not high enough to be dangerous You may take ibuprofen if needed for the chest wall pain

## 2018-12-30 NOTE — ED Triage Notes (Signed)
Pt here for left sided CP starting this am

## 2018-12-30 NOTE — ED Provider Notes (Signed)
[facility]    CSN: [id number] Arrival date & time: 12/30/18  1128     History   Chief Complaint Chief Complaint  Patient presents with  . Chest Pain    HPI Darryl [name] is a 68 y.o. male.   HPI  Darryl [name] is a 68 year old gentleman who is here for chest pain.  He states he been having chest pain intermittently for a couple of months.  He states is always on the left side of his chest.  It comes and goes.  Last for about 15 minutes.  It is random.  It is not related to activity.  It is not related to stress.  It does not awaken him at night.  He has been able to pursue his normal activities.  He does not have shortness of breath.  He does not have any dizziness or lightheadedness.  He does not have any radiation of pain. Previously was diagnosed with hypertension.  He no longer takes hypertension medication.  He has been taking his blood pressure recently with the chest pain and has been as high as 158/112.  Today it is 16 6/83. He previously was diagnosed with diabetes.  He no longer takes diabetes medicine.  He is careful with his diet. He does have a primary care doctor.  He has an appointment with her tomorrow.  He came in today because he read that with his elevated blood pressure, a medical visit should be sooner rather than later.  Past Medical History:  Diagnosis Date  . Acute deep vein thrombosis (DVT) of left femoral vein (HCC) 07/01/2015  . Diabetes mellitus without complication (HCC)   . Hypertension   . Seizures [facility](HCC)     Patient Active Problem List   Diagnosis Date Noted  . Chronic left shoulder pain 07/20/2018  . Subungual hematoma of foot, left, initial encounter 07/20/2018  . 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 Falls   . Acute deep vein thrombosis (DVT) of left femoral vein (HCC) 07/01/2015  . Pain   . Poor fluid intake   . Essential hypertension   . Type 2 diabetes mellitus with  complication, without long-term current use of insulin (HCC)   . Disorientation   . Convulsions (HCC)   . Acute blood loss anemia   . Hypokalemia   . Ileus (HCC)   . Leukocytosis   . Right knee pain   . Cognitive safety issue   . Impulsiveness   . PVC (premature ventricular contraction)   . Abdominal discomfort   . SAH (subarachnoid hemorrhage) (HCC) 06/11/2015  . Emesis   . Subarachnoid hemorrhage (HCC) 06/10/2015  . Hypertension   . Diabetes mellitus without complication [facility](HCC)     Past Surgical History:  Procedure Laterality Date  . CHOLECYSTECTOMY    . IR GENERIC HISTORICAL  05/16/2016   IR ANGIO VERTEBRAL SEL VERTEBRAL UNI L MOD SED 05/16/2016 [facility]  . IR GENERIC HISTORICAL  05/16/2016   IR ANGIOGRAM EXTREMITY RIGHT 05/16/2016 [facility]  . IR GENERIC HISTORICAL  05/16/2016   IR ANGIO INTRA EXTRACRAN SEL COM CAROTID INNOMINATE UNI R MOD SED 05/16/2016 [facility]  . KNEE SURGERY    . RADIOLOGY WITH ANESTHESIA N/A 06/11/2015   Procedure: RADIOLOGY WITH ANESTHESIA;  Surgeon: Lisbeth RenshawNeelesh Nundkumar, MD;  [facility];  Service: Radiology;  Laterality: N/A;       Home Medications    Prior to Admission medications   Medication Sig Start Date End  Date Taking? Authorizing Provider  CIALIS 5 MG tablet  07/16/16   [provider]  FeFum-FePoly-FA-B Cmp-C-Biot (FOLIVANE-PLUS PO) Take by mouth.    [provider]  KLOR-CON M20 20 MEQ tablet TAKE 1 TABLET BY MOUTH AT BEDTIME 10/22/15   Ranelle OysterSwartz, Zachary T, MD  lamoTRIgine (LAMICTAL) 100 MG tablet Take 100 mg by mouth 2 (two) times daily. Reported on 08/28/2015    [provider]  lamoTRIgine (LAMICTAL) 25 MG tablet Take 25 mg by mouth daily.    [provider]  Vitamin D, Ergocalciferol, (DRISDOL) 50000 UNITS CAPS capsule Take 50,000 Units by mouth every Sunday.  05/05/15   [provider]    Family History Family History  Problem Relation Age of Onset  . Alzheimer's disease  Mother   . Diabetes Father   . Aneurysm Brother   . Aneurysm Sister     Social History Social History   Tobacco Use  . Smoking status: Former Smoker    Quit date: 06/02/1988    Years since quitting: 30.5  . Smokeless tobacco: Never Used  Substance Use Topics  . Alcohol use: No  . Drug use: No     Allergies   Patient has no known allergies.   Review of Systems Review of Systems  Constitutional: Negative for chills and fever.  HENT: Negative for ear pain and sore throat.   Eyes: Negative for pain and visual disturbance.  Respiratory: Negative for cough and shortness of breath.   Cardiovascular: Positive for chest pain. Negative for palpitations.  Gastrointestinal: Negative for abdominal pain and vomiting.  Genitourinary: Negative for dysuria and hematuria.  Musculoskeletal: Negative for arthralgias and back pain.  Skin: Negative for color change and rash.  Neurological: Negative for seizures and syncope.  All other systems reviewed and are negative.    Physical Exam Triage Vital Signs ED Triage Vitals  Enc Vitals Group     BP 12/30/18 1143 (!) 166/83     Pulse Rate 12/30/18 1143 (!) 56     Resp 12/30/18 1143 16     Temp 12/30/18 1143 98.2 F (36.8 C)     Temp Source 12/30/18 1143 Temporal     SpO2 12/30/18 1143 96 %     Weight --      Height --      Head Circumference --      Peak Flow --      Pain Score 12/30/18 1148 4     Pain Loc --      Pain Edu? --      Excl. in GC? --    No data found.  Updated Vital Signs BP (!) 166/83 (BP Location: Right Arm)   Pulse (!) 56   Temp 98.2 F (36.8 C) (Temporal)   Resp 16   SpO2 96%      Physical Exam Constitutional:      General: He is not in acute distress.    Appearance: He is well-developed.  HENT:     Head: Normocephalic and atraumatic.  Eyes:     Conjunctiva/sclera: Conjunctivae normal.     Pupils: Pupils are equal, round, and reactive to light.  Neck:     Musculoskeletal: Normal range of motion.   Cardiovascular:     Rate and Rhythm: Normal rate and regular rhythm.  Extrasystoles are present.    Pulses:          Carotid pulses are 2+ on the right side and 2+ on the left side.  Radial pulses are 2+ on the right side and 2+ on the left side.       Dorsalis pedis pulses are 2+ on the right side and 2+ on the left side.       Posterior tibial pulses are 2+ on the right side and 2+ on the left side.     Heart sounds: Normal heart sounds. No murmur.     Comments: There is chest wall tenderness just to the left of the second and third ribs.  This does reproduce his chest pain Pulmonary:     Effort: Pulmonary effort is normal. No respiratory distress.     Breath sounds: Normal breath sounds.  Abdominal:     General: There is no distension.     Palpations: Abdomen is soft.  Musculoskeletal: Normal range of motion.  Skin:    General: Skin is warm and dry.  Neurological:     Mental Status: He is alert.  Psychiatric:        Mood and Affect: Mood normal.        Behavior: Behavior normal.      UC Treatments / Results  Labs (all labs ordered are listed, but only abnormal results are displayed) Labs Reviewed - No data to display  EKG EKG is compared with prior.  No acute changes.  No ST or T wave changes.  Normal rate and rhythm.  1 PVC is noted   Radiology Dg Chest 2 View  Result Date: 12/30/2018 CLINICAL DATA:  Left anterior chest pain starting this morning EXAM: CHEST - 2 VIEW COMPARISON:  July 14, 2017 FINDINGS: The heart size and mediastinal contours are stable. The aorta is tortuous. Both lungs are clear. The visualized skeletal structures are unremarkable. IMPRESSION: No active cardiopulmonary disease. Electronically Signed   By: Abelardo Diesel M.D.   On: 12/30/2018 12:37    Procedures Procedures (including critical care time)  Medications Ordered in UC Medications - No data to display  Initial Impression / Assessment and Plan / UC Course  I have reviewed the  triage vital signs and the nursing notes.  Pertinent labs & imaging results that were available during my care of the patient were reviewed by me and considered in my medical decision making (see chart for details).      Final Clinical Impressions(s) / UC Diagnoses   Final diagnoses:  Chest wall pain     Discharge Instructions     Your chest x-ray is normal Your EKG does not show any changes except for the irregular heartbeats See your family doctor tomorrow Although your blood pressure is elevated, is not high enough to be dangerous You may take ibuprofen if needed for the chest wall pain    ED Prescriptions    None     Controlled Substance Prescriptions [place] Controlled Substance Registry consulted? Not Applicable   Raylene Everts, MD 12/30/18 1328

## 2019-06-08 ENCOUNTER — Encounter: Payer: Self-pay | Admitting: Orthopaedic Surgery

## 2019-06-08 ENCOUNTER — Ambulatory Visit: Payer: Self-pay

## 2019-06-08 ENCOUNTER — Other Ambulatory Visit: Payer: Self-pay

## 2019-06-08 ENCOUNTER — Ambulatory Visit (INDEPENDENT_AMBULATORY_CARE_PROVIDER_SITE_OTHER): Payer: Medicare PPO | Admitting: Orthopaedic Surgery

## 2019-06-08 VITALS — Ht 71.0 in | Wt 163.0 lb

## 2019-06-08 DIAGNOSIS — M65311 Trigger thumb, right thumb: Secondary | ICD-10-CM

## 2019-06-08 DIAGNOSIS — G8929 Other chronic pain: Secondary | ICD-10-CM

## 2019-06-08 DIAGNOSIS — M7542 Impingement syndrome of left shoulder: Secondary | ICD-10-CM

## 2019-06-08 DIAGNOSIS — M79644 Pain in right finger(s): Secondary | ICD-10-CM | POA: Diagnosis not present

## 2019-06-08 NOTE — Progress Notes (Signed)
Office Visit Note   Patient: Darryl [name]           Date of Birth: [birth year]           MRN: [id number] Visit Date: 06/08/2019              Requested by: Renaye Rakers, MD [address] ,  [place] [address] PCP: Renaye Rakers, MD   Assessment & Plan: Visit Diagnoses:  1. Chronic pain of right thumb   2. Trigger thumb, right thumb   3. Impingement syndrome of left shoulder     Plan: We applied dorsal splint splint over the IP joint of the right thumb he can remove for washing his hands.  Used for couple weeks.  We will set him up for some therapy for treatment of his calcific tendinitis with impingement.  If he has persistent problems we can consider diagnostic injection versus MRI imaging.  We discussed possible injection A1 pulley if the dorsal splinting does not resolve his problem.  Follow-Up Instructions: Return in about 8 weeks (around 08/03/2019).   Orders:  Orders Placed This Encounter  Procedures  . XR Finger Thumb Right  . Ambulatory referral to Physical Therapy   No orders of the defined types were placed in this encounter.     Procedures: No procedures performed   Clinical Data: No additional findings.   Subjective: Chief Complaint  Patient presents with  . Right Thumb - Pain  . Left Shoulder - Pain    HPI 69 year old male returns with problems with left shoulder pain and also right thumb triggering.  He states he did some yard work and was pulling up ivey and is noticed right thumb pain more than left with repetitive catching but he does not require his opposite hand to extend it.  Opposite left hand has been mildly painful as well.  Patient is also had increased problems with his left shoulder.  Previous x-ray showed some calcific tendinopathy at the supraspinatus insertion site small area.  No glenohumeral arthritis.  Patient notes pain with internal rotation of the shoulder outstretch and overhead activities.   Review of Systems 14 point  systems updated unchanged from 07/20/2018.  Labs for diabetes A1c less than 6.   Objective: Vital Signs: Ht 5\' 11"  (1.803 m)   Wt 163 lb (73.9 kg)   BMI 22.73 kg/m   Physical Exam Constitutional:      Appearance: He is well-developed.  HENT:     Head: Normocephalic and atraumatic.  Eyes:     Pupils: Pupils are equal, round, and reactive to light.  Neck:     Thyroid: No thyromegaly.     Trachea: No tracheal deviation.  Cardiovascular:     Rate and Rhythm: Normal rate.  Pulmonary:     Effort: Pulmonary effort is normal.     Breath sounds: No wheezing.  Abdominal:     General: Bowel sounds are normal.     Palpations: Abdomen is soft.  Skin:    General: Skin is warm and dry.     Capillary Refill: Capillary refill takes less than 2 seconds.  Neurological:     Mental Status: He is alert and oriented to person, place, and time.  Psychiatric:        Behavior: Behavior normal.        Thought Content: Thought content normal.        Judgment: Judgment normal.     Ortho Exam patient has a positive impingement  left shoulder negative right.  Slight to moderate tenderness over the left A1 pulley of the thumb without triggering.  Right hand shows moderate to severe tenderness over the A1 pulley palpable nodule with flexion extension with some catching but no true triggering.  Specialty Comments:  No specialty comments available.  Imaging: No results found.   PMFS History: Patient Active Problem List   Diagnosis Date Noted  . Trigger thumb, right thumb 06/08/2019  . Impingement syndrome of left shoulder 06/08/2019  . Chronic left shoulder pain 07/20/2018  . Subungual hematoma of foot, left, initial encounter 07/20/2018  . 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 Falls   . Acute deep vein thrombosis (DVT) of left femoral vein ([place]) 07/01/2015  . Pain   . Poor fluid intake   . Essential hypertension   . Type 2 diabetes  mellitus with complication, without long-term current use of insulin ([place])   . Disorientation   . Convulsions ([place])   . Acute blood loss anemia   . Hypokalemia   . Ileus ([place])   . Leukocytosis   . Right knee pain   . Cognitive safety issue   . Impulsiveness   . PVC (premature ventricular contraction)   . Abdominal discomfort   . SAH (subarachnoid hemorrhage) ([place]) 06/11/2015  . Emesis   . Subarachnoid hemorrhage ([place]) 06/10/2015  . Hypertension   . Diabetes mellitus without complication [facility])    Past Medical History:  Diagnosis Date  . Acute deep vein thrombosis (DVT) of left femoral vein ([place]) 07/01/2015  . Diabetes mellitus without complication ([place])   . Hypertension   . Seizures ([place])     Family History  Problem Relation Age of Onset  . Alzheimer's disease Mother   . Diabetes Father   . Aneurysm Brother   . Aneurysm Sister     Past Surgical History:  Procedure Laterality Date  . CHOLECYSTECTOMY    . IR GENERIC HISTORICAL  05/16/2016   IR ANGIO VERTEBRAL SEL VERTEBRAL UNI L MOD SED 05/16/2016 [facility]  . IR GENERIC HISTORICAL  05/16/2016   IR ANGIOGRAM EXTREMITY RIGHT 05/16/2016 [facility]  . IR GENERIC HISTORICAL  05/16/2016   IR ANGIO INTRA EXTRACRAN SEL COM CAROTID INNOMINATE UNI R MOD SED 05/16/2016 [facility]  . KNEE SURGERY    . RADIOLOGY WITH ANESTHESIA N/A 06/11/2015   Procedure: RADIOLOGY WITH ANESTHESIA;  Surgeon: [name], MD;  Location: [place];  Service: Radiology;  Laterality: N/A;   Social History   Occupational History  . Not on file  Tobacco Use  . Smoking status: Former Smoker    Quit date: 06/02/1988    Years since quitting: 31.0  . Smokeless tobacco: Never Used  Substance and Sexual Activity  . Alcohol use: No  . Drug use: No  . Sexual activity: Not on file

## 2019-06-14 DIAGNOSIS — R569 Unspecified convulsions: Secondary | ICD-10-CM | POA: Diagnosis not present

## 2019-06-14 DIAGNOSIS — E1169 Type 2 diabetes mellitus with other specified complication: Secondary | ICD-10-CM | POA: Diagnosis not present

## 2019-06-14 DIAGNOSIS — R42 Dizziness and giddiness: Secondary | ICD-10-CM | POA: Diagnosis not present

## 2019-06-14 DIAGNOSIS — E785 Hyperlipidemia, unspecified: Secondary | ICD-10-CM | POA: Diagnosis not present

## 2019-06-16 ENCOUNTER — Other Ambulatory Visit: Payer: Self-pay | Admitting: Radiology

## 2019-06-16 ENCOUNTER — Telehealth: Payer: Self-pay | Admitting: Radiology

## 2019-06-16 DIAGNOSIS — R42 Dizziness and giddiness: Secondary | ICD-10-CM

## 2019-06-16 NOTE — Telephone Encounter (Signed)
Attempted to reach patient to have his event monitor mailed to him. Unable to leave msg mailbox is full.

## 2019-06-17 DIAGNOSIS — R42 Dizziness and giddiness: Secondary | ICD-10-CM | POA: Diagnosis not present

## 2019-06-17 DIAGNOSIS — R001 Bradycardia, unspecified: Secondary | ICD-10-CM | POA: Diagnosis not present

## 2019-06-17 NOTE — Telephone Encounter (Signed)
Enrolled patient for a 30 day [employer] Event monitor to be mailed to patients home. Brief instructions were left on patient voice mail per DPR.

## 2019-06-18 DIAGNOSIS — R001 Bradycardia, unspecified: Secondary | ICD-10-CM | POA: Insufficient documentation

## 2019-06-18 DIAGNOSIS — R42 Dizziness and giddiness: Secondary | ICD-10-CM | POA: Insufficient documentation

## 2019-06-27 ENCOUNTER — Encounter (INDEPENDENT_AMBULATORY_CARE_PROVIDER_SITE_OTHER): Payer: Medicare PPO

## 2019-06-27 DIAGNOSIS — R42 Dizziness and giddiness: Secondary | ICD-10-CM | POA: Diagnosis not present

## 2019-06-28 ENCOUNTER — Ambulatory Visit: Payer: Medicare PPO | Admitting: Physical Therapy

## 2019-06-28 ENCOUNTER — Other Ambulatory Visit: Payer: Self-pay

## 2019-06-28 DIAGNOSIS — G4089 Other seizures: Secondary | ICD-10-CM | POA: Diagnosis not present

## 2019-06-28 DIAGNOSIS — M25512 Pain in left shoulder: Secondary | ICD-10-CM

## 2019-06-28 DIAGNOSIS — M25612 Stiffness of left shoulder, not elsewhere classified: Secondary | ICD-10-CM | POA: Diagnosis not present

## 2019-06-28 DIAGNOSIS — G8929 Other chronic pain: Secondary | ICD-10-CM

## 2019-06-28 DIAGNOSIS — E1169 Type 2 diabetes mellitus with other specified complication: Secondary | ICD-10-CM | POA: Diagnosis not present

## 2019-06-28 DIAGNOSIS — M6281 Muscle weakness (generalized): Secondary | ICD-10-CM

## 2019-06-28 NOTE — Therapy (Signed)
[facility] Physical Therapy [address] [place], [place], [address] Phone: [phone]   Fax:  [phone]  Physical Therapy Evaluation  Patient Details  Name: Darryl [name] MRN: [id number] Date of Birth: 1950-07-16 Referring Provider (PT): Annell Greening, MD   Encounter Date: 06/28/2019  PT End of Session - 06/28/19 1337    Visit Number  1    Number of Visits  13    Date for PT Re-Evaluation  08/22/19    Authorization Type  [employer]    PT Start Time  0845    PT Stop Time  0930    PT Time Calculation (min)  45 min    Activity Tolerance  Patient tolerated treatment well    Behavior During Therapy  [facility] for tasks assessed/performed       Past Medical History:  Diagnosis Date  . Acute deep vein thrombosis (DVT) of left femoral vein (HCC) 07/01/2015  . Diabetes mellitus without complication (HCC)   . Hypertension   . Seizures (HCC)     Past Surgical History:  Procedure Laterality Date  . CHOLECYSTECTOMY    . IR GENERIC HISTORICAL  05/16/2016   IR ANGIO VERTEBRAL SEL VERTEBRAL UNI L MOD SED 05/16/2016 [facility]  . IR GENERIC HISTORICAL  05/16/2016   IR ANGIOGRAM EXTREMITY RIGHT 05/16/2016 [facility]  . IR GENERIC HISTORICAL  05/16/2016   IR ANGIO INTRA EXTRACRAN SEL COM CAROTID INNOMINATE UNI R MOD SED 05/16/2016 [facility]  . KNEE SURGERY    . RADIOLOGY WITH ANESTHESIA N/A 06/11/2015   Procedure: RADIOLOGY WITH ANESTHESIA;  Surgeon: Lisbeth Renshaw, MD;  Location: [facility] OR;  Service: Radiology;  Laterality: N/A;    There were no vitals filed for this visit.   Subjective Assessment - 06/28/19 0853    Subjective  Pt arriving to therapy reporting L shoulder pain that has been ongoing for at least 2 years. Pt reporting no pain at rest, but reporting pain with certain movements. Pt reporting pain can increase to 8/10 depending on his activity.    Diagnostic tests  X-ray    Patient Stated Goals  "Do anything to stop it from hurting"    Currently in  Pain?  Yes    Pain Score  0-No pain    Pain Location  Shoulder    Pain Orientation  Left    Pain Descriptors / Indicators  Aching    Pain Type  Chronic pain    Pain Onset  More than a month ago    Pain Frequency  Intermittent    Aggravating Factors   lifting above my shoulder height, lifting    Pain Relieving Factors  resting    Effect of Pain on Daily Activities  difficulty with household chores and lifting objects, difficulty reaching         [facility] PT Assessment - 06/28/19 0001      Assessment   Medical Diagnosis  Left shoulder impingment    Referring Provider (PT)  Annell Greening, MD    Hand Dominance  Right    Prior Therapy  yes, for knee      Precautions   Precautions  None      Restrictions   Weight Bearing Restrictions  No      Balance Screen   Has the patient fallen in the past 6 months  No    Is the patient reluctant to leave their home because of a fear of falling?   No      ROM / Strength  AROM / PROM / Strength  AROM;Strength      AROM   AROM Assessment Site  Shoulder    Right/Left Shoulder  Right;Left    Right Shoulder Extension  40 Degrees    Right Shoulder Flexion  135 Degrees    Right Shoulder ABduction  138 Degrees    Right Shoulder Internal Rotation  --   Thumb to T8   Right Shoulder External Rotation  65 Degrees    Left Shoulder Extension  44 Degrees    Left Shoulder Flexion  125 Degrees    Left Shoulder ABduction  108 Degrees    Left Shoulder Internal Rotation  --   Thumb to T10   Left Shoulder External Rotation  55 Degrees      Strength   Strength Assessment Site  Shoulder    Right/Left Shoulder  Right;Left    Right Shoulder Flexion  5/5    Right Shoulder Extension  5/5    Right Shoulder ABduction  5/5    Right Shoulder Internal Rotation  5/5    Right Shoulder External Rotation  5/5    Left Shoulder Flexion  4-/5    Left Shoulder Extension  4-/5    Left Shoulder ABduction  4-/5    Left Shoulder Internal Rotation  3+/5    Left Shoulder  External Rotation  3+/5      Palpation   Palpation comment  TTP over posterior capsule      Special Tests    Special Tests  Rotator Cuff Impingement    Rotator Cuff Impingment tests  Michel Bickers test      Hawkins-Kennedy test   Findings  Positive    Side  Left      Transfers   Five time sit to stand comments   15 seconds      Ambulation/Gait   Gait Pattern  Within Functional Limits                Objective measurements completed on examination: See above findings.              PT Education - 06/28/19 0856    Education Details  PT POC, HEP    Person(s) Educated  Patient    Methods  Explanation;Demonstration;Other (comment)    Comprehension  Verbalized understanding;Returned demonstration          PT Long Term Goals - 06/28/19 1329      PT LONG TERM GOAL #1   Title  pt will be independent in his HEP and progression.    Baseline  initial HEP issued on 06/28/2019    Time  6    Period  Weeks    Status  New    Target Date  08/09/19      PT LONG TERM GOAL #2   Title  Pt will improve his Left shoulder flexion to >/= 140 degrees.    Baseline  L flexion: 125 degrees on 06/28/2019    Time  6    Period  Weeks    Status  New    Target Date  08/09/19      PT LONG TERM GOAL #3   Title  Pt will be able to perform overhead reaching to hand up clothing in his closset with pain </= 2/10.    Baseline  pain can vary increaseing to 8/10 at times    Time  6    Status  New    Target Date  08/09/19  PT LONG TERM GOAL #4   Title  pt will be able to lift 10# object from floor to counter height without difficutly and with no pain reported.    Baseline  unable due to pain    Time  6    Period  Weeks    Status  New    Target Date  08/09/19      PT LONG TERM GOAL #5   Title  -    Baseline  -             Plan - 06/28/19 0916    Clinical Impression Statement  Pt presenting with L shoulder  impingement syndrome. Pt reporting intermittent pain  in his left shoulder. Pt with mild decrease in ROM and strength when compared to his R UE. Pt reporting difficulties reaching and with overhead activities. Positive Leanord Asal test on the left. Pt could benefit from skilled PT to address the listed impairments with the below interventions.    Personal Factors and Comorbidities  Comorbidity 3+    Comorbidities  HTN, h/o DVT, DM, seizures    Examination-Activity Limitations  Carry;Lift;Reach Overhead    Examination-Participation Restrictions  Community Activity;Yard Work;Other    Stability/Clinical Decision Making  Stable/Uncomplicated    Clinical Decision Making  Low    Rehab Potential  Good    PT Frequency  2x / week    PT Duration  6 weeks    PT Treatment/Interventions  Cryotherapy;Ultrasound;Moist Heat;Electrical Stimulation;Therapeutic activities;Therapeutic exercise;Balance training;Neuromuscular re-education;Patient/family education;Taping;Dry needling;Passive range of motion;Manual techniques    PT Next Visit Plan  shoulder ROM, add isometrics to HEP, gentle strengthening, modalities as needed    PT Home Exercise Plan  Access Code: [email or web address]    Consulted and Agree with Plan of Care  Patient       Patient will benefit from skilled therapeutic intervention in order to improve the following deficits and impairments:  Pain, Decreased strength, Postural dysfunction, Decreased activity tolerance, Decreased range of motion, Impaired UE functional use  Visit Diagnosis: Chronic left shoulder pain  Stiffness of left shoulder, not elsewhere classified  Muscle weakness (generalized)     Problem List Patient Active Problem List   Diagnosis Date Noted  . Trigger thumb, right thumb 06/08/2019  . Impingement syndrome of left shoulder 06/08/2019  . Chronic left shoulder pain 07/20/2018  . Subungual hematoma of foot, left, initial encounter 07/20/2018  . 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 Falls   . Acute deep vein thrombosis (DVT) of left femoral vein (HCC) 07/01/2015  . Pain   . Poor fluid intake   . Essential hypertension   . Type 2 diabetes mellitus with complication, without long-term current use of insulin (HCC)   . Disorientation   . Convulsions (HCC)   . Acute blood loss anemia   . Hypokalemia   . Ileus (HCC)   . Leukocytosis   . Right knee pain   . Cognitive safety issue   . Impulsiveness   . PVC (premature ventricular contraction)   . Abdominal discomfort   . SAH (subarachnoid hemorrhage) (HCC) 06/11/2015  . Emesis   . Subarachnoid hemorrhage (HCC) 06/10/2015  . Hypertension   . Diabetes mellitus without complication [facility])     Sharmon Leyden, PT 06/28/2019, 1:39 PM  [facility] Physical Therapy [address] [place], [place], [address] Phone: [phone]   Fax:  [phone]  Name: Darryl [name] MRN: [id number] Date of Birth: 1951/04/09

## 2019-06-30 ENCOUNTER — Encounter: Payer: Medicare PPO | Admitting: Physical Therapy

## 2019-07-04 DIAGNOSIS — R42 Dizziness and giddiness: Secondary | ICD-10-CM | POA: Diagnosis not present

## 2019-07-13 ENCOUNTER — Other Ambulatory Visit: Payer: Self-pay

## 2019-07-13 ENCOUNTER — Ambulatory Visit (INDEPENDENT_AMBULATORY_CARE_PROVIDER_SITE_OTHER): Payer: Medicare PPO | Admitting: Physical Therapy

## 2019-07-13 ENCOUNTER — Encounter: Payer: Self-pay | Admitting: Physical Therapy

## 2019-07-13 DIAGNOSIS — M6281 Muscle weakness (generalized): Secondary | ICD-10-CM

## 2019-07-13 DIAGNOSIS — G8929 Other chronic pain: Secondary | ICD-10-CM | POA: Diagnosis not present

## 2019-07-13 DIAGNOSIS — M25512 Pain in left shoulder: Secondary | ICD-10-CM

## 2019-07-13 DIAGNOSIS — M25612 Stiffness of left shoulder, not elsewhere classified: Secondary | ICD-10-CM

## 2019-07-13 NOTE — Therapy (Signed)
[facility] [address] [place], [place], [address] Phone: [phone]   Fax:  [phone]  Physical Therapy Treatment  Patient Details  Name: Darryl [name] MRN: [id number] Date of Birth: 1950-11-01 Referring Provider (PT): Annell Greening, MD   Encounter Date: 07/13/2019  PT End of Session - 07/13/19 1054    Visit Number  2    Number of Visits  13    Date for PT Re-Evaluation  08/22/19    Authorization Type  [employer]    PT Start Time  1015    PT Stop Time  1053    PT Time Calculation (min)  38 min    Activity Tolerance  Patient tolerated treatment well    Behavior During Therapy  [facility] for tasks assessed/performed       Past Medical History:  Diagnosis Date  . Acute deep vein thrombosis (DVT) of left femoral vein (HCC) 07/01/2015  . Diabetes mellitus without complication (HCC)   . Hypertension   . Seizures (HCC)     Past Surgical History:  Procedure Laterality Date  . CHOLECYSTECTOMY    . IR GENERIC HISTORICAL  05/16/2016   IR ANGIO VERTEBRAL SEL VERTEBRAL UNI L MOD SED 05/16/2016 [facility]  . IR GENERIC HISTORICAL  05/16/2016   IR ANGIOGRAM EXTREMITY RIGHT 05/16/2016 [facility]  . IR GENERIC HISTORICAL  05/16/2016   IR ANGIO INTRA EXTRACRAN SEL COM CAROTID INNOMINATE UNI R MOD SED 05/16/2016 [facility]  . KNEE SURGERY    . RADIOLOGY WITH ANESTHESIA N/A 06/11/2015   Procedure: RADIOLOGY WITH ANESTHESIA;  Surgeon: Lisbeth Renshaw, MD;  Location: [facility] OR;  Service: Radiology;  Laterality: N/A;    There were no vitals filed for this visit.  Subjective Assessment - 07/13/19 1010    Subjective  shoulder is "a little sore" but overall feels a little better.    Diagnostic tests  X-ray    Patient Stated Goals  "Do anything to stop it from hurting"    Currently in Pain?  Yes    Pain Score  3     Pain Location  Shoulder    Pain Orientation  Left    Pain Descriptors / Indicators  Dull    Pain Type  Surgical pain    Pain Onset  More  than a month ago    Aggravating Factors   lifting above shoulder height    Pain Relieving Factors  resting                       [facility] Adult PT Treatment/Exercise - 07/13/19 1019      Exercises   Exercises  Shoulder      Shoulder Exercises: Supine   External Rotation  Left;10 reps;AAROM   1# bar   Flexion  Both;AAROM;10 reps   1# bar     Shoulder Exercises: Standing   Row  Both;15 reps;Theraband    Theraband Level (Shoulder Row)  Level 2 (Red)      Shoulder Exercises: Pulleys   Flexion  2 minutes    Scaption Limitations  attempted - c/o sharp and burning pain so stopped      Shoulder Exercises: Therapy Ball   Flexion  Both;10 reps    Flexion Limitations  standing with red physioball on tall plinth - limited motion    Scaption  Left;10 reps    Scaption Limitations  standing with red physioball on tall plinth - limited motion      Shoulder Exercises: ROM/Strengthening  UBE (Upper Arm Bike)  L1.0 x 3 min (1.5' each direction)      Manual Therapy   Manual Therapy  Joint mobilization;Passive ROM;Soft tissue mobilization    Joint Mobilization  Lt shoulder A/P and inf mobs grades 2-3    Soft tissue mobilization  Lt deltoids/pecs/upper traps    Passive ROM  Lt shoulder flexion/abduction/er/ir                  PT Long Term Goals - 06/28/19 1329      PT LONG TERM GOAL #1   Title  pt will be independent in his HEP and progression.    Baseline  initial HEP issued on 06/28/2019    Time  6    Period  Weeks    Status  New    Target Date  08/09/19      PT LONG TERM GOAL #2   Title  Pt will improve his Left shoulder flexion to >/= 140 degrees.    Baseline  L flexion: 125 degrees on 06/28/2019    Time  6    Period  Weeks    Status  New    Target Date  08/09/19      PT LONG TERM GOAL #3   Title  Pt will be able to perform overhead reaching to hand up clothing in his closset with pain </= 2/10.    Baseline  pain can vary increaseing to 8/10 at times     Time  6    Status  New    Target Date  08/09/19      PT LONG TERM GOAL #4   Title  pt will be able to lift 10# object from floor to counter height without difficutly and with no pain reported.    Baseline  unable due to pain    Time  6    Period  Weeks    Status  New    Target Date  08/09/19      PT LONG TERM GOAL #5   Title  -    Baseline  -            Plan - 07/13/19 1054    Clinical Impression Statement  Pt tolerated session well today, had some increased pain with UBE so may hold on next session.  Discussed listening to shoulder to avoid pushing into sharp or "pinching" pain and that progress will take time.  Pt verbalized understanding.  Will continue to benefit from PT to maximize function.    Personal Factors and Comorbidities  Comorbidity 3+    Comorbidities  HTN, h/o DVT, DM, seizures    Examination-Activity Limitations  Carry;Lift;Reach Overhead    Examination-Participation Restrictions  Community Activity;Yard Work;Other    Stability/Clinical Decision Making  Stable/Uncomplicated    Rehab Potential  Good    PT Frequency  2x / week    PT Duration  6 weeks    PT Treatment/Interventions  Cryotherapy;Ultrasound;Moist Heat;Electrical Stimulation;Therapeutic activities;Therapeutic exercise;Balance training;Neuromuscular re-education;Patient/family education;Taping;Dry needling;Passive range of motion;Manual techniques    PT Next Visit Plan  shoulder ROM, add isometrics to HEP, gentle strengthening, modalities as needed    PT Home Exercise Plan  Access Code: [email or web address]    Consulted and Agree with Plan of Care  Patient       Patient will benefit from skilled therapeutic intervention in order to improve the following deficits and impairments:  Pain, Decreased strength, Postural dysfunction, Decreased activity tolerance, Decreased range of motion, Impaired UE functional  use  Visit Diagnosis: Chronic left shoulder pain  Stiffness of left shoulder, not elsewhere  classified  Muscle weakness (generalized)     Problem List Patient Active Problem List   Diagnosis Date Noted  . Trigger thumb, right thumb 06/08/2019  . Impingement syndrome of left shoulder 06/08/2019  . Chronic left shoulder pain 07/20/2018  . Subungual hematoma of foot, left, initial encounter 07/20/2018  . 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 Falls   . Acute deep vein thrombosis (DVT) of left femoral vein (HCC) 07/01/2015  . Pain   . Poor fluid intake   . Essential hypertension   . Type 2 diabetes mellitus with complication, without long-term current use of insulin (HCC)   . Disorientation   . Convulsions (HCC)   . Acute blood loss anemia   . Hypokalemia   . Ileus (HCC)   . Leukocytosis   . Right knee pain   . Cognitive safety issue   . Impulsiveness   . PVC (premature ventricular contraction)   . Abdominal discomfort   . SAH (subarachnoid hemorrhage) (HCC) 06/11/2015  . Emesis   . Subarachnoid hemorrhage (HCC) 06/10/2015  . Hypertension   . Diabetes mellitus without complication (HCC)       Clarita Crane, PT, DPT 07/13/19 10:57 AM     [facility] [address] [place], [place], [address] Phone: [phone]   Fax:  [phone]  Name: Darryl [name] MRN: [id number] Date of Birth: 01-Jun-1951

## 2019-07-15 ENCOUNTER — Ambulatory Visit: Payer: Medicare PPO | Admitting: Rehabilitative and Restorative Service Providers"

## 2019-07-15 ENCOUNTER — Other Ambulatory Visit: Payer: Self-pay

## 2019-07-15 ENCOUNTER — Encounter: Payer: Self-pay | Admitting: Rehabilitative and Restorative Service Providers"

## 2019-07-15 DIAGNOSIS — M6281 Muscle weakness (generalized): Secondary | ICD-10-CM | POA: Diagnosis not present

## 2019-07-15 DIAGNOSIS — M25512 Pain in left shoulder: Secondary | ICD-10-CM

## 2019-07-15 DIAGNOSIS — M25612 Stiffness of left shoulder, not elsewhere classified: Secondary | ICD-10-CM | POA: Diagnosis not present

## 2019-07-15 DIAGNOSIS — G8929 Other chronic pain: Secondary | ICD-10-CM

## 2019-07-15 NOTE — Therapy (Signed)
[facility] [address] [place], [place], [address] Phone: [phone]   Fax:  [phone]  Physical Therapy Treatment  Patient Details  Name: Darryl [name] MRN: [id number] Date of Birth: 1951-05-01 Referring Provider (PT): Rodell Perna, MD   Encounter Date: 07/15/2019  PT End of Session - 07/15/19 1336    Visit Number  3    Number of Visits  13    Date for PT Re-Evaluation  08/22/19    Authorization Type  [employer]    PT Start Time  5176    PT Stop Time  1607    PT Time Calculation (min)  39 min    Activity Tolerance  Patient limited by pain;Other (comment)   Pt. was reluctant to perform any movement c mild complaints or more.   Behavior During Therapy  [facility] for tasks assessed/performed       Past Medical History:  Diagnosis Date  . Acute deep vein thrombosis (DVT) of left femoral vein ([place]) 07/01/2015  . Diabetes mellitus without complication ([place])   . Hypertension   . Seizures ([place])     Past Surgical History:  Procedure Laterality Date  . CHOLECYSTECTOMY    . IR GENERIC HISTORICAL  05/16/2016   IR ANGIO VERTEBRAL SEL VERTEBRAL UNI L MOD SED 05/16/2016 [facility]  . IR GENERIC HISTORICAL  05/16/2016   IR ANGIOGRAM EXTREMITY RIGHT 05/16/2016 [facility]  . IR GENERIC HISTORICAL  05/16/2016   IR ANGIO INTRA EXTRACRAN SEL COM CAROTID INNOMINATE UNI R MOD SED 05/16/2016 [facility]  . KNEE SURGERY    . RADIOLOGY WITH ANESTHESIA N/A 06/11/2015   Procedure: RADIOLOGY WITH ANESTHESIA;  Surgeon: [name], MD;  Location: [place];  Service: Radiology;  Laterality: N/A;    There were no vitals filed for this visit.  Subjective Assessment - 07/15/19 1327    Subjective  Pt. stated feeling fairly well overall.  Some mild pain at times.    Diagnostic tests  X-ray    Patient Stated Goals  "Do anything to stop it from hurting"    Pain Score  2     Pain Location  Shoulder    Pain Orientation  Left    Pain Onset  More than a month ago                        [facility] Adult PT Treatment/Exercise - 07/15/19 0001      Shoulder Exercises: Supine   Horizontal ABduction  Strengthening;Other (comment)   3 x 10   Theraband Level (Shoulder Horizontal ABduction)  Level 3 (Green)      Shoulder Exercises: Seated   Retraction  Both   B UE ER in neutral c scapular retraction   Row  Both   3 x 10   Theraband Level (Shoulder Row)  Level 3 (Green)      Shoulder Exercises: ROM/Strengthening   UBE (Upper Arm Bike)  L1.0 x 2 mins each direction      Manual Therapy   Manual Therapy  Joint mobilization;Passive ROM;Soft tissue mobilization    Manual therapy comments  Stabilization c mild restriction in supine 90 deg flexion    Joint Mobilization  Lt shoulder A/P and inf mobs grades 2-3    Passive ROM  Lt shoulder flexion/abduction/er/ir             PT Education - 07/15/19 1328    Education Details  Review HEP, cues for new intervention in clinic.  Education  on myofascial trigger point.    Person(s) Educated  Patient    Methods  Explanation;Demonstration;Verbal cues    Comprehension  Verbalized understanding;Returned demonstration          PT Long Term Goals - 06/28/19 1329      PT LONG TERM GOAL #1   Title  pt will be independent in his HEP and progression.    Baseline  initial HEP issued on 06/28/2019    Time  6    Period  Weeks    Status  New    Target Date  08/09/19      PT LONG TERM GOAL #2   Title  Pt will improve his Left shoulder flexion to >/= 140 degrees.    Baseline  L flexion: 125 degrees on 06/28/2019    Time  6    Period  Weeks    Status  New    Target Date  08/09/19      PT LONG TERM GOAL #3   Title  Pt will be able to perform overhead reaching to hand up clothing in his closset with pain </= 2/10.    Baseline  pain can vary increaseing to 8/10 at times    Time  6    Status  New    Target Date  08/09/19      PT LONG TERM GOAL #4   Title  pt will be able to lift 10# object from  floor to counter height without difficutly and with no pain reported.    Baseline  unable due to pain    Time  6    Period  Weeks    Status  New    Target Date  08/09/19      PT LONG TERM GOAL #5   Title  -    Baseline  -            Plan - 07/15/19 1333    Clinical Impression Statement  Presentation today indicated mild to moderate complaints in painful arc in flexion, abduction at this time against gravity.  Myofascial trigger point noted in Lt infraspinatus that has relationship to concordant symptoms.    Personal Factors and Comorbidities  Comorbidity 3+    Comorbidities  HTN, h/o DVT, DM, seizures    Examination-Activity Limitations  Carry;Lift;Reach Overhead    Examination-Participation Restrictions  Community Activity;Yard Work;Other    Stability/Clinical Decision Making  Stable/Uncomplicated    Rehab Potential  Good    PT Frequency  2x / week    PT Duration  6 weeks    PT Treatment/Interventions  Cryotherapy;Ultrasound;Moist Heat;Electrical Stimulation;Therapeutic activities;Therapeutic exercise;Balance training;Neuromuscular re-education;Patient/family education;Taping;Dry needling;Passive range of motion;Manual techniques    PT Next Visit Plan  Possible myofascial release (held due to upcoming COVID shot).    PT Home Exercise Plan  Access Code: [email or web address]    Consulted and Agree with Plan of Care  Patient       Patient will benefit from skilled therapeutic intervention in order to improve the following deficits and impairments:  Pain, Decreased strength, Postural dysfunction, Decreased activity tolerance, Decreased range of motion, Impaired UE functional use  Visit Diagnosis: Chronic left shoulder pain  Stiffness of left shoulder, not elsewhere classified  Muscle weakness (generalized)     Problem List Patient Active Problem List   Diagnosis Date Noted  . Trigger thumb, right thumb 06/08/2019  . Impingement syndrome of left shoulder 06/08/2019  . Chronic left  shoulder pain 07/20/2018  . Subungual hematoma of foot,  left, initial encounter 07/20/2018  . 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 Falls   . Acute deep vein thrombosis (DVT) of left femoral vein (HCC) 07/01/2015  . Pain   . Poor fluid intake   . Essential hypertension   . Type 2 diabetes mellitus with complication, without long-term current use of insulin (HCC)   . Disorientation   . Convulsions (HCC)   . Acute blood loss anemia   . Hypokalemia   . Ileus (HCC)   . Leukocytosis   . Right knee pain   . Cognitive safety issue   . Impulsiveness   . PVC (premature ventricular contraction)   . Abdominal discomfort   . SAH (subarachnoid hemorrhage) (HCC) 06/11/2015  . Emesis   . Subarachnoid hemorrhage (HCC) 06/10/2015  . Hypertension   . Diabetes mellitus without complication (HCC)     Chyrel Masson, PT, DPT, OCS, ATC 07/15/19  1:48 PM    [facility] Therapy [address] [place], [place], [address] Phone: [phone]   Fax:  [phone]  Name: Darious Rehman MRN: [id number] Date of Birth: 02-Feb-1951

## 2019-07-21 ENCOUNTER — Encounter: Payer: Medicare PPO | Admitting: Physical Therapy

## 2019-07-22 ENCOUNTER — Ambulatory Visit: Payer: Medicare PPO | Admitting: Physical Therapy

## 2019-07-22 ENCOUNTER — Other Ambulatory Visit: Payer: Self-pay

## 2019-07-22 ENCOUNTER — Encounter: Payer: Self-pay | Admitting: Physical Therapy

## 2019-07-22 DIAGNOSIS — M6281 Muscle weakness (generalized): Secondary | ICD-10-CM

## 2019-07-22 DIAGNOSIS — M25512 Pain in left shoulder: Secondary | ICD-10-CM | POA: Diagnosis not present

## 2019-07-22 DIAGNOSIS — G8929 Other chronic pain: Secondary | ICD-10-CM | POA: Diagnosis not present

## 2019-07-22 DIAGNOSIS — M25612 Stiffness of left shoulder, not elsewhere classified: Secondary | ICD-10-CM | POA: Diagnosis not present

## 2019-07-22 NOTE — Therapy (Signed)
[facility] Physical Therapy [address] [place], [place], [address] Phone: [phone]   Fax:  [phone]  Physical Therapy Treatment  Patient Details  Name: Darryl [name] MRN: [id number] Date of Birth: 1951-04-11 Referring Provider (PT): Rodell Perna, MD   Encounter Date: 07/22/2019  PT End of Session - 07/22/19 1316    Visit Number  4    Number of Visits  13    Date for PT Re-Evaluation  08/22/19    PT Start Time  3474    PT Stop Time  2595    PT Time Calculation (min)  43 min    Activity Tolerance  Patient limited by pain;Other (comment)    Behavior During Therapy  [facility] for tasks assessed/performed       Past Medical History:  Diagnosis Date  . Acute deep vein thrombosis (DVT) of left femoral vein ([place]) 07/01/2015  . Diabetes mellitus without complication ([place])   . Hypertension   . Seizures ([place])     Past Surgical History:  Procedure Laterality Date  . CHOLECYSTECTOMY    . IR GENERIC HISTORICAL  05/16/2016   IR ANGIO VERTEBRAL SEL VERTEBRAL UNI L MOD SED 05/16/2016 [facility]  . IR GENERIC HISTORICAL  05/16/2016   IR ANGIOGRAM EXTREMITY RIGHT 05/16/2016 [facility]  . IR GENERIC HISTORICAL  05/16/2016   IR ANGIO INTRA EXTRACRAN SEL COM CAROTID INNOMINATE UNI R MOD SED 05/16/2016 [facility]  . KNEE SURGERY    . RADIOLOGY WITH ANESTHESIA N/A 06/11/2015   Procedure: RADIOLOGY WITH ANESTHESIA;  Surgeon: [name], MD;  Location: [place];  Service: Radiology;  Laterality: N/A;    There were no vitals filed for this visit.  Subjective Assessment - 07/22/19 1317    Subjective  " I was doing better, The pain started a little bit ago"    Currently in Pain?  Yes    Pain Score  4     Pain Orientation  Left    Pain Descriptors / Indicators  Aching    Pain Type  Chronic pain    Pain Onset  More than a month ago    Aggravating Factors   lifting above the head    Pain Relieving Factors  resting                       [facility]  Adult PT Treatment/Exercise - 07/22/19 0001      Shoulder Exercises: Supine   Protraction  Strengthening;12 reps;Left   2#   Other Supine Exercises  lower trap strengthening with elbows propped on bolster 2 x 10 with orange theraband      Shoulder Exercises: Seated   Flexion  Strengthening;10 reps   scaption angle with no weight    Flexion Limitations  cues to avoid going beyond shoulder height    Other Seated Exercises  anterior pelvic tilt to promote efficent posture dropping shoulders down and back 1 x 10 hodling 2-3 seconds ea.      Manual Therapy   Manual Therapy  Scapular mobilization    Manual therapy comments  MTPR L rhomboids and subclavius, supraspinatus    Joint Mobilization  distal clavicle mobs inferior/ posterior grade III    Scapular Mobilization  L scapular mobs for upward rotation             PT Education - 07/22/19 1358    Education Details  updated HEp for anterior pelvic tilt to promote efficent posture in sitting    Person(s)  Educated  Patient    Methods  Explanation;Verbal cues;Handout    Comprehension  Verbalized understanding;Verbal cues required          PT Long Term Goals - 06/28/19 1329      PT LONG TERM GOAL #1   Title  pt will be independent in his HEP and progression.    Baseline  initial HEP issued on 06/28/2019    Time  6    Period  Weeks    Status  New    Target Date  08/09/19      PT LONG TERM GOAL #2   Title  Pt will improve his Left shoulder flexion to >/= 140 degrees.    Baseline  L flexion: 125 degrees on 06/28/2019    Time  6    Period  Weeks    Status  New    Target Date  08/09/19      PT LONG TERM GOAL #3   Title  Pt will be able to perform overhead reaching to hand up clothing in his closset with pain </= 2/10.    Baseline  pain can vary increaseing to 8/10 at times    Time  6    Status  New    Target Date  08/09/19      PT LONG TERM GOAL #4   Title  pt will be able to lift 10# object from floor to counter height  without difficutly and with no pain reported.    Baseline  unable due to pain    Time  6    Period  Weeks    Status  New    Target Date  08/09/19      PT LONG TERM GOAL #5   Title  -    Baseline  -            Plan - 07/22/19 1345    Clinical Impression Statement  pt reports doing well but did get some pain before coming in. he continues to demontrate painfull arc but notes pain at the A/C joint and along the distal supraspinatus. worked on scapulohumeral rhythm strengthening serrauts and lower trap. end of sesion he noted decreased pain with reaching overhead    PT Treatment/Interventions  Cryotherapy;Ultrasound;Moist Heat;Electrical Stimulation;Therapeutic activities;Therapeutic exercise;Balance training;Neuromuscular re-education;Patient/family education;Taping;Dry needling;Passive range of motion;Manual techniques    PT Next Visit Plan  Possible myofascial release (held due to upcoming COVID shot). scapular stability, STW along supraspinatus, scapualr mobs       Patient will benefit from skilled therapeutic intervention in order to improve the following deficits and impairments:  Pain, Decreased strength, Postural dysfunction, Decreased activity tolerance, Decreased range of motion, Impaired UE functional use  Visit Diagnosis: Chronic left shoulder pain  Stiffness of left shoulder, not elsewhere classified  Muscle weakness (generalized)     Problem List Patient Active Problem List   Diagnosis Date Noted  . Trigger thumb, right thumb 06/08/2019  . Impingement syndrome of left shoulder 06/08/2019  . Chronic left shoulder pain 07/20/2018  . Subungual hematoma of foot, left, initial encounter 07/20/2018  . 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 Falls   . Acute deep vein thrombosis (DVT) of left femoral vein (HCC) 07/01/2015  . Pain   . Poor fluid intake   . Essential hypertension   . Type 2 diabetes mellitus  with complication, without long-term current use of insulin (HCC)   . Disorientation   . Convulsions (HCC)   . Acute  blood loss anemia   . Hypokalemia   . Ileus (HCC)   . Leukocytosis   . Right knee pain   . Cognitive safety issue   . Impulsiveness   . PVC (premature ventricular contraction)   . Abdominal discomfort   . SAH (subarachnoid hemorrhage) (HCC) 06/11/2015  . Emesis   . Subarachnoid hemorrhage (HCC) 06/10/2015  . Hypertension   . Diabetes mellitus without complication (HCC)    Lulu Riding PT, DPT, LAT, ATC  07/22/19  2:00 PM      [facility] Physical Therapy [address] [place], [place], [address] Phone: [phone]   Fax:  [phone]  Name: Dakin Madani MRN: [id number] Date of Birth: 01-Dec-1950

## 2019-07-26 ENCOUNTER — Encounter: Payer: Self-pay | Admitting: Physical Therapy

## 2019-07-26 ENCOUNTER — Other Ambulatory Visit: Payer: Self-pay

## 2019-07-26 ENCOUNTER — Ambulatory Visit (INDEPENDENT_AMBULATORY_CARE_PROVIDER_SITE_OTHER): Payer: Medicare PPO | Admitting: Physical Therapy

## 2019-07-26 DIAGNOSIS — M25512 Pain in left shoulder: Secondary | ICD-10-CM | POA: Diagnosis not present

## 2019-07-26 DIAGNOSIS — G8929 Other chronic pain: Secondary | ICD-10-CM | POA: Diagnosis not present

## 2019-07-26 DIAGNOSIS — M25612 Stiffness of left shoulder, not elsewhere classified: Secondary | ICD-10-CM

## 2019-07-26 DIAGNOSIS — M6281 Muscle weakness (generalized): Secondary | ICD-10-CM

## 2019-07-26 NOTE — Therapy (Signed)
[facility] [address] [place], [place], [address] Phone: [phone]   Fax:  [phone]  Physical Therapy Treatment  Patient Details  Name: Darryl [name] MRN: [id number] Date of Birth: 02-03-1951 Referring Provider (PT): Annell Greening, MD   Encounter Date: 07/26/2019  PT End of Session - 07/26/19 1308    Visit Number  5    Number of Visits  13    Date for PT Re-Evaluation  08/22/19    PT Start Time  1110   pt arrived late   PT Stop Time  1143    PT Time Calculation (min)  33 min    Activity Tolerance  Patient limited by pain;Other (comment)    Behavior During Therapy  WFL for tasks assessed/performed       Past Medical History:  Diagnosis Date  . Acute deep vein thrombosis (DVT) of left femoral vein (HCC) 07/01/2015  . Diabetes mellitus without complication (HCC)   . Hypertension   . Seizures (HCC)     Past Surgical History:  Procedure Laterality Date  . CHOLECYSTECTOMY    . IR GENERIC HISTORICAL  05/16/2016   IR ANGIO VERTEBRAL SEL VERTEBRAL UNI L MOD SED 05/16/2016 [facility]  . IR GENERIC HISTORICAL  05/16/2016   IR ANGIOGRAM EXTREMITY RIGHT 05/16/2016 [facility]  . IR GENERIC HISTORICAL  05/16/2016   IR ANGIO INTRA EXTRACRAN SEL COM CAROTID INNOMINATE UNI R MOD SED 05/16/2016 [facility]  . KNEE SURGERY    . RADIOLOGY WITH ANESTHESIA N/A 06/11/2015   Procedure: RADIOLOGY WITH ANESTHESIA;  Surgeon: Lisbeth Renshaw, MD;  Location: [facility] OR;  Service: Radiology;  Laterality: N/A;    There were no vitals filed for this visit.  Subjective Assessment - 07/26/19 1111    Subjective  doing well; shoulder is unchanged.  "I can't lay on it."    Patient Stated Goals  "Do anything to stop it from hurting"    Currently in Pain?  Yes    Pain Location  Shoulder    Pain Orientation  Left    Pain Descriptors / Indicators  Aching    Pain Type  Chronic pain    Pain Onset  More than a month ago    Pain Frequency  Intermittent    Aggravating Factors   holding arm up/out, sleeping, "excessive" exercise    Pain Relieving Factors  rest, repositioning, avoidance                       [facility] Adult PT Treatment/Exercise - 07/26/19 1122      Shoulder Exercises: Supine   Flexion  Right;5 reps;Weights    Shoulder Flexion Weight (lbs)  1      Shoulder Exercises: Sidelying   External Rotation  Left;10 reps;Weights    External Rotation Weight (lbs)  1    ABduction  Left;10 reps;Weights    ABduction Weight (lbs)  1    Other Sidelying Exercises  Lt scap retraction x 10 reps      Manual Therapy   Manual therapy comments  MTPR L rhomboids and subclavius, supraspinatus    Soft tissue mobilization  Lt deltoids, triceps, infraspinatus and teres minor and into rhomboids with IASTM    Scapular Mobilization  L scapular mobs for upward rotation                  PT Long Term Goals - 06/28/19 1329      PT LONG TERM GOAL #1   Title  pt will be independent in his HEP and progression.    Baseline  initial HEP issued on 06/28/2019    Time  6    Period  Weeks    Status  New    Target Date  08/09/19      PT LONG TERM GOAL #2   Title  Pt will improve his Left shoulder flexion to >/= 140 degrees.    Baseline  L flexion: 125 degrees on 06/28/2019    Time  6    Period  Weeks    Status  New    Target Date  08/09/19      PT LONG TERM GOAL #3   Title  Pt will be able to perform overhead reaching to hand up clothing in his closset with pain </= 2/10.    Baseline  pain can vary increaseing to 8/10 at times    Time  6    Status  New    Target Date  08/09/19      PT LONG TERM GOAL #4   Title  pt will be able to lift 10# object from floor to counter height without difficutly and with no pain reported.    Baseline  unable due to pain    Time  6    Period  Weeks    Status  New    Target Date  08/09/19      PT LONG TERM GOAL #5   Title  -    Baseline  -            Plan - 07/26/19 1308     Clinical Impression Statement  Pt tolerated session well today, but reports limited improvement in symptoms to date.  Will continue to benefit from PT to maximize function.    PT Treatment/Interventions  Cryotherapy;Ultrasound;Moist Heat;Electrical Stimulation;Therapeutic activities;Therapeutic exercise;Balance training;Neuromuscular re-education;Patient/family education;Taping;Dry needling;Passive range of motion;Manual techniques    PT Next Visit Plan  Possible myofascial release (held due to upcoming COVID shot). scapular stability, STW along supraspinatus, scapualr mobs    PT Home Exercise Plan  Access Code: [email or web address]    Consulted and Agree with Plan of Care  Patient       Patient will benefit from skilled therapeutic intervention in order to improve the following deficits and impairments:  Pain, Decreased strength, Postural dysfunction, Decreased activity tolerance, Decreased range of motion, Impaired UE functional use  Visit Diagnosis: Chronic left shoulder pain  Stiffness of left shoulder, not elsewhere classified  Muscle weakness (generalized)     Problem List Patient Active Problem List   Diagnosis Date Noted  . Trigger thumb, right thumb 06/08/2019  . Impingement syndrome of left shoulder 06/08/2019  . Chronic left shoulder pain 07/20/2018  . Subungual hematoma of foot, left, initial encounter 07/20/2018  . 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 Falls   . Acute deep vein thrombosis (DVT) of left femoral vein (HCC) 07/01/2015  . Pain   . Poor fluid intake   . Essential hypertension   . Type 2 diabetes mellitus with complication, without long-term current use of insulin (HCC)   . Disorientation   . Convulsions (HCC)   . Acute blood loss anemia   . Hypokalemia   . Ileus (HCC)   . Leukocytosis   . Right knee pain   . Cognitive safety issue   . Impulsiveness   . PVC (premature ventricular contraction)   .  Abdominal discomfort   . SAH (subarachnoid hemorrhage) (HCC) 06/11/2015  .  Emesis   . Subarachnoid hemorrhage ([place]) 06/10/2015  . Hypertension   . Diabetes mellitus without complication ([place])       Laureen Abrahams, PT, DPT 07/26/19 1:11 PM    [facility] [address] [place], [place], [address] Phone: [phone]   Fax:  [phone]  Name: Darryl [name] MRN: [id number] Date of Birth: 12-13-1950

## 2019-07-28 ENCOUNTER — Other Ambulatory Visit: Payer: Self-pay

## 2019-07-28 ENCOUNTER — Ambulatory Visit: Payer: Medicare PPO | Admitting: Physical Therapy

## 2019-07-28 ENCOUNTER — Encounter: Payer: Self-pay | Admitting: Physical Therapy

## 2019-07-28 DIAGNOSIS — M25512 Pain in left shoulder: Secondary | ICD-10-CM

## 2019-07-28 DIAGNOSIS — M25612 Stiffness of left shoulder, not elsewhere classified: Secondary | ICD-10-CM

## 2019-07-28 DIAGNOSIS — M6281 Muscle weakness (generalized): Secondary | ICD-10-CM

## 2019-07-28 DIAGNOSIS — G8929 Other chronic pain: Secondary | ICD-10-CM

## 2019-07-28 NOTE — Therapy (Signed)
[facility] [address] [place], [place], [address] Phone: [phone]   Fax:  [phone]  Physical Therapy Treatment  Patient Details  Name: Darryl [name] MRN: [id number] Date of Birth: Jun 21, 1950 Referring Provider (PT): Annell Greening, MD   Encounter Date: 07/28/2019  PT End of Session - 07/28/19 1140    Visit Number  6    Number of Visits  13    Date for PT Re-Evaluation  08/22/19    PT Start Time  1102    PT Stop Time  1140    PT Time Calculation (min)  38 min    Activity Tolerance  Patient limited by pain;Other (comment)    Behavior During Therapy  WFL for tasks assessed/performed       Past Medical History:  Diagnosis Date  . Acute deep vein thrombosis (DVT) of left femoral vein (HCC) 07/01/2015  . Diabetes mellitus without complication (HCC)   . Hypertension   . Seizures (HCC)     Past Surgical History:  Procedure Laterality Date  . CHOLECYSTECTOMY    . IR GENERIC HISTORICAL  05/16/2016   IR ANGIO VERTEBRAL SEL VERTEBRAL UNI L MOD SED 05/16/2016 [facility]  . IR GENERIC HISTORICAL  05/16/2016   IR ANGIOGRAM EXTREMITY RIGHT 05/16/2016 [facility]  . IR GENERIC HISTORICAL  05/16/2016   IR ANGIO INTRA EXTRACRAN SEL COM CAROTID INNOMINATE UNI R MOD SED 05/16/2016 [facility]  . KNEE SURGERY    . RADIOLOGY WITH ANESTHESIA N/A 06/11/2015   Procedure: RADIOLOGY WITH ANESTHESIA;  Surgeon: Lisbeth Renshaw, MD;  Location: [facility] OR;  Service: Radiology;  Laterality: N/A;    There were no vitals filed for this visit.  Subjective Assessment - 07/28/19 1105    Subjective  feels shoulder "hasn't changed." further discussion pt feels he has more motion and pain is "somewhat better."    Patient Stated Goals  "Do anything to stop it from hurting"    Currently in Pain?  Yes    Pain Score  1     Pain Location  Shoulder    Pain Orientation  Left    Pain Descriptors / Indicators  Aching    Pain Onset  More than a month ago                        [facility] Adult PT Treatment/Exercise - 07/28/19 1126      Shoulder Exercises: Supine   Protraction  Strengthening;Both;20 reps   3# bar   Protraction Limitations  c/o tightness in ant neck and upper trap    Flexion  Both;20 reps   3# bar   Other Supine Exercises  chest press with 3# bar x 20 reps      Shoulder Exercises: Seated   Retraction  Both;20 reps    Retraction Limitations  5 sec hold    Other Seated Exercises  shoulder rolls backwards x 15 reps      Shoulder Exercises: Stretch   Other Shoulder Stretches  Lt upper trap stretch 3x30 sec      Manual Therapy   Manual therapy comments  skilled palpation and monitoring of soft tissue during DN    Soft tissue mobilization  Lt upper trap/levator/infraspinatus       Trigger Point Dry Needling - 07/28/19 1128    Consent Given?  Yes    Education Handout Provided  Yes    Muscles Treated Head and Neck  Upper trapezius;Levator scapulae    Muscles Treated  Upper Quadrant  Infraspinatus    Upper Trapezius Response  Twitch reponse elicited;Palpable increased muscle length    Levator Scapulae Response  Twitch response elicited;Palpable increased muscle length    Infraspinatus Response  Twitch response elicited;Palpable increased muscle length           PT Education - 07/28/19 1140    Education Details  DN    Person(s) Educated  Patient    Methods  Explanation;Handout    Comprehension  Verbalized understanding          PT Long Term Goals - 06/28/19 1329      PT LONG TERM GOAL #1   Title  pt will be independent in his HEP and progression.    Baseline  initial HEP issued on 06/28/2019    Time  6    Period  Weeks    Status  New    Target Date  08/09/19      PT LONG TERM GOAL #2   Title  Pt will improve his Left shoulder flexion to >/= 140 degrees.    Baseline  L flexion: 125 degrees on 06/28/2019    Time  6    Period  Weeks    Status  New    Target Date  08/09/19      PT LONG TERM  GOAL #3   Title  Pt will be able to perform overhead reaching to hand up clothing in his closset with pain </= 2/10.    Baseline  pain can vary increaseing to 8/10 at times    Time  6    Status  New    Target Date  08/09/19      PT LONG TERM GOAL #4   Title  pt will be able to lift 10# object from floor to counter height without difficutly and with no pain reported.    Baseline  unable due to pain    Time  6    Period  Weeks    Status  New    Target Date  08/09/19      PT LONG TERM GOAL #5   Title  -    Baseline  -            Plan - 07/28/19 1141    Clinical Impression Statement  Pt initially stating no change in progress at this time, and with further discussion he does feel motion and pain are slightly improved.  Trial of DN today to active trigger points to see how he responds.  Will continue to benefit from PT to maximize function.    PT Treatment/Interventions  Cryotherapy;Ultrasound;Moist Heat;Electrical Stimulation;Therapeutic activities;Therapeutic exercise;Balance training;Neuromuscular re-education;Patient/family education;Taping;Dry needling;Passive range of motion;Manual techniques    PT Next Visit Plan  assess response to DN,  scapular stability, STW along supraspinatus, scapualr mobs    PT Home Exercise Plan  Access Code: [email or web address]    Consulted and Agree with Plan of Care  Patient       Patient will benefit from skilled therapeutic intervention in order to improve the following deficits and impairments:  Pain, Decreased strength, Postural dysfunction, Decreased activity tolerance, Decreased range of motion, Impaired UE functional use  Visit Diagnosis: Chronic left shoulder pain  Stiffness of left shoulder, not elsewhere classified  Muscle weakness (generalized)     Problem List Patient Active Problem List   Diagnosis Date Noted  . Trigger thumb, right thumb 06/08/2019  . Impingement syndrome of left shoulder 06/08/2019  . Chronic left shoulder pain  07/20/2018  . Subungual hematoma of foot, left, initial encounter 07/20/2018  . 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 Falls   . Acute deep vein thrombosis (DVT) of left femoral vein (HCC) 07/01/2015  . Pain   . Poor fluid intake   . Essential hypertension   . Type 2 diabetes mellitus with complication, without long-term current use of insulin (HCC)   . Disorientation   . Convulsions (HCC)   . Acute blood loss anemia   . Hypokalemia   . Ileus (HCC)   . Leukocytosis   . Right knee pain   . Cognitive safety issue   . Impulsiveness   . PVC (premature ventricular contraction)   . Abdominal discomfort   . SAH (subarachnoid hemorrhage) (HCC) 06/11/2015  . Emesis   . Subarachnoid hemorrhage (HCC) 06/10/2015  . Hypertension   . Diabetes mellitus without complication (HCC)       Clarita Crane, PT, DPT 07/28/19 11:45 AM     [facility] [address] [place], [place], [address] Phone: [phone]   Fax:  [phone]  Name: Darryl [name] MRN: [id number] Date of Birth: June 19, 1950

## 2019-07-29 DIAGNOSIS — R0789 Other chest pain: Secondary | ICD-10-CM | POA: Diagnosis not present

## 2019-07-29 DIAGNOSIS — E782 Mixed hyperlipidemia: Secondary | ICD-10-CM | POA: Diagnosis not present

## 2019-08-01 ENCOUNTER — Ambulatory Visit: Payer: Self-pay | Admitting: Cardiology

## 2019-08-01 NOTE — Progress Notes (Deleted)
REASON FOR CONSULT: Low Pulse.   No chief complaint on file.   REQUESTING PHYSICIAN:  Renaye Rakers, MD [address] [place],  [place] [address]  HPI  Darryl [name] is a 69 y.o. male who presents to the office with a chief complaint of "***." Patient's past medical history and cardiac risk factors include: ***  Patient is unaccompanied at today's office visit.  Patient is accompanied by *** at today's office visit.    ***  Review of systems positive for: Currently patient denies chest pain, shortness of breath at rest or effort related symptoms, lightheadedness, dizziness, palpitations, orthopnea, paroxysmal nocturnal dyspnea, lower extremity swelling, near syncope, syncopal events, hematochezia, hemoptysis, hematemesis, melanotic stools, no symptoms of amaurosis fugax, motor or sensory symptoms or dysphasia in the last 6 months.   History of  Denies prior history of coronary artery disease, myocardial infarction, congestive heart failure, deep venous thrombosis, pulmonary embolism, stroke, transient ischemic attack.  FUNCTIONAL STATUS: ***   ALLERGIES: No Known Allergies   MEDICATION LIST PRIOR TO VISIT: Current Outpatient Medications on File Prior to Visit  Medication Sig Dispense Refill  . CIALIS 5 MG tablet     . FeFum-FePoly-FA-B Cmp-C-Biot (FOLIVANE-PLUS PO) Take by mouth.    Marland Kitchen KLOR-CON M20 20 MEQ tablet TAKE 1 TABLET BY MOUTH AT BEDTIME 30 tablet 2  . lamoTRIgine (LAMICTAL) 100 MG tablet Take 100 mg by mouth 2 (two) times daily. Reported on 08/28/2015    . lamoTRIgine (LAMICTAL) 25 MG tablet Take 25 mg by mouth daily.    . Vitamin D, Ergocalciferol, (DRISDOL) 50000 UNITS CAPS capsule Take 50,000 Units by mouth every Sunday.   5   No current facility-administered medications on file prior to visit.    PAST MEDICAL HISTORY: Past Medical History:  Diagnosis Date  . Acute deep vein thrombosis (DVT) of left femoral vein (HCC) 07/01/2015  . Diabetes mellitus without  complication (HCC)   . Hypertension   . Seizures (HCC)     PAST SURGICAL HISTORY: Past Surgical History:  Procedure Laterality Date  . CHOLECYSTECTOMY    . IR GENERIC HISTORICAL  05/16/2016   IR ANGIO VERTEBRAL SEL VERTEBRAL UNI L MOD SED 05/16/2016 [facility]  . IR GENERIC HISTORICAL  05/16/2016   IR ANGIOGRAM EXTREMITY RIGHT 05/16/2016 [facility]  . IR GENERIC HISTORICAL  05/16/2016   IR ANGIO INTRA EXTRACRAN SEL COM CAROTID INNOMINATE UNI R MOD SED 05/16/2016 [facility]  . KNEE SURGERY    . RADIOLOGY WITH ANESTHESIA N/A 06/11/2015   Procedure: RADIOLOGY WITH ANESTHESIA;  Surgeon: Lisbeth Renshaw, MD;  Location: [facility] OR;  Service: Radiology;  Laterality: N/A;    FAMILY HISTORY: The patient family history includes Alzheimer's disease in his mother; Aneurysm in his brother and sister; Diabetes in his father.   SOCIAL HISTORY:  The patient  reports that he quit smoking about 31 years ago. He has never used smokeless tobacco. He reports that he does not drink alcohol or use drugs.  14 ORGAN REVIEW OF SYSTEMS: CONSTITUTIONAL: No fever or significant weight loss EYES: No recent significant visual change EARS, NOSE, MOUTH, THROAT: No recent significant change in hearing CARDIOVASCULAR: See discussion in subjective/HPI RESPIRATORY: See discussion in subjective/HPI GASTROINTESTINAL: No recent complaints of abdominal pain GENITOURINARY: No recent significant change in genitourinary status MUSCULOSKELETAL: No recent significant change in musculoskeletal status INTEGUMENTARY: No recent rash NEUROLOGIC: No recent significant change in motor function PSYCHIATRIC: No recent significant change in mood ENDOCRINOLOGIC: No recent significant change  in endocrine status HEMATOLOGIC/LYMPHATIC: No recent significant unexpected bruising ALLERGIC/IMMUNOLOGIC: No recent unexplained allergic reaction  PHYSICAL EXAM: Vitals with BMI 06/08/2019 12/30/2018 07/20/2018  Height 5\' 11"  - 5' 10.5"   Weight 163 lbs - 170 lbs  BMI 38.93 - 73.42  Systolic - 876 811  Diastolic - 83 82  Pulse - 56 56     CONSTITUTIONAL: Well-developed and well-nourished. No acute distress.  SKIN: Skin is warm and dry. No rash noted. No cyanosis. No pallor. No jaundice HEAD: Normocephalic and atraumatic.  EYES: No scleral icterus MOUTH/THROAT: Moist oral membranes.  NECK: No JVD present. No thyromegaly noted. No carotid bruits  LYMPHATIC: No visible cervical adenopathy.  CHEST Normal respiratory effort. No intercostal retractions  LUNGS: *** No stridor. No wheezes. No rales.  CARDIOVASCULAR: *** ABDOMINAL: No apparent ascites.  EXTREMITIES: No peripheral edema  HEMATOLOGIC: No significant bruising NEUROLOGIC: Oriented to person, place, and time. Nonfocal. Normal muscle tone.  PSYCHIATRIC: Normal mood and affect. Normal behavior. Cooperative  RADIOLOGY: ***  CARDIAC DATABASE: Coronary artery bypass grafting:  Year: Surgical anatomy:  Valve surgery:  Pacemaker/ICD in situ:  EKG: ***  Echocardiogram: ***  Stress Testing:  ***  Heart Catheterization: ***  Carotid duplex: ***  Vascular imaging: US Abdomen (AAA screening): Ultrasound arterial duplex: Ultrasound venous duplex:   Holter:   14-Day MCOT:   LABORATORY DATA: CBC Latest Ref Rng & Units 07/14/2017 05/16/2016 07/21/2015  WBC 4.0 - 10.5 K/uL - 5.8 7.5  Hemoglobin 13.0 - 17.0 g/dL 17.0 14.3 11.0(L)  Hematocrit 39.0 - 52.0 % 50.0 42.6 33.6(L)  Platelets 150 - 400 K/uL - 244 341    CMP Latest Ref Rng & Units 07/14/2017 05/16/2016 07/19/2015  Glucose 65 - 99 mg/dL 145(H) 142(H) 122(H)  BUN 6 - 20 mg/dL 23(H) 14 11  Creatinine 0.61 - 1.24 mg/dL 1.40(H) 1.12 0.87  Sodium 135 - 145 mmol/L 140 143 139  Potassium 3.5 - 5.1 mmol/L 3.8 4.1 4.4  Chloride 101 - 111 mmol/L 101 107 107  CO2 22 - 32 mmol/L - 29 23  Calcium 8.9 - 10.3 mg/dL - 9.5 9.5  Total Protein 6.5 - 8.1 g/dL - - -  Total Bilirubin 0.3 - 1.2 mg/dL - - -   Alkaline Phos 38 - 126 U/L - - -  AST 15 - 41 U/L - - -  ALT 17 - 63 U/L - - -    Lipid Panel     Component Value Date/Time   CHOL  10/02/2009 0615    166        ATP III CLASSIFICATION:  <200     mg/dL   Desirable  200-239  mg/dL   Borderline High  >=240    mg/dL   High          TRIG 80 06/12/2015 0724   HDL 62 10/02/2009 0615   CHOLHDL 2.7 10/02/2009 0615   VLDL 7 10/02/2009 0615   LDLCALC  10/02/2009 0615    97        Total Cholesterol/HDL:CHD Risk Coronary Heart Disease Risk Table                     Men   Women  1/2 Average Risk   3.4   3.3  Average Risk       5.0   4.4  2 X Average Risk   9.6   7.1  3 X Average Risk  23.4   11.0  Use the calculated Patient Ratio above and the CHD Risk Table to determine the patient's CHD Risk.        ATP III CLASSIFICATION (LDL):  <100     mg/dL   Optimal  093-267  mg/dL   Near or Above                    Optimal  130-159  mg/dL   Borderline  124-580  mg/dL   High  >998     mg/dL   Very High    No results found for: HGBA1C No components found for: NTPROBNP Lab Results  Component Value Date   TSH 1.530 ***Test methodology is 3rd generation TSH*** 10/01/2009    FINAL MEDICATION LIST END OF ENCOUNTER: No orders of the defined types were placed in this encounter.   There are no discontinued medications.   Current Outpatient Medications:  .  CIALIS 5 MG tablet, , Disp: , Rfl:  .  FeFum-FePoly-FA-B Cmp-C-Biot (FOLIVANE-PLUS PO), Take by mouth., Disp: , Rfl:  .  KLOR-CON M20 20 MEQ tablet, TAKE 1 TABLET BY MOUTH AT BEDTIME, Disp: 30 tablet, Rfl: 2 .  lamoTRIgine (LAMICTAL) 100 MG tablet, Take 100 mg by mouth 2 (two) times daily. Reported on 08/28/2015, Disp: , Rfl:  .  lamoTRIgine (LAMICTAL) 25 MG tablet, Take 25 mg by mouth daily., Disp: , Rfl:  .  Vitamin D, Ergocalciferol, (DRISDOL) 50000 UNITS CAPS capsule, Take 50,000 Units by mouth every Sunday. , Disp: , Rfl: 5  IMPRESSION:  No diagnosis found.    RECOMMENDATIONS:   No orders of the defined types were placed in this encounter.    --Continue cardiac medications as reconciled in final medication list. --No follow-ups on file.. Or sooner if needed. --Continue follow-up with your primary care physician regarding the management of your other chronic comorbid conditions.  Patient's questions and concerns were addressed to *** satisfaction. He voices understanding of the instructions provided during this encounter.   This note was created using a voice recognition software as a result there may be grammatical errors inadvertently enclosed that do not reflect the nature of this encounter. Every attempt is made to correct such errors.  Tessa Lerner, DO, [facility] Office: [phone]

## 2019-08-02 ENCOUNTER — Other Ambulatory Visit: Payer: Self-pay

## 2019-08-02 ENCOUNTER — Ambulatory Visit: Payer: Medicare PPO | Admitting: Physical Therapy

## 2019-08-02 DIAGNOSIS — M25612 Stiffness of left shoulder, not elsewhere classified: Secondary | ICD-10-CM

## 2019-08-02 DIAGNOSIS — M6281 Muscle weakness (generalized): Secondary | ICD-10-CM | POA: Diagnosis not present

## 2019-08-02 DIAGNOSIS — G8929 Other chronic pain: Secondary | ICD-10-CM

## 2019-08-02 DIAGNOSIS — M25512 Pain in left shoulder: Secondary | ICD-10-CM

## 2019-08-02 NOTE — Therapy (Addendum)
[facility] [address] [place], [place], [address] Phone: [phone]   Fax:  [phone]  Physical Therapy Treatment/Discharge Summary  Patient Details  Name: Darryl [name] MRN: [id number] Date of Birth: 1951-04-21 Referring Provider (PT): Rodell Perna, MD   Encounter Date: 08/02/2019  PT End of Session - 08/02/19 1206    Visit Number  7    Number of Visits  13    Date for PT Re-Evaluation  08/22/19    PT Start Time  1102    PT Stop Time  1145    PT Time Calculation (min)  43 min    Activity Tolerance  Patient limited by pain;Other (comment)    Behavior During Therapy  WFL for tasks assessed/performed       Past Medical History:  Diagnosis Date  . Acute deep vein thrombosis (DVT) of left femoral vein ([place]) 07/01/2015  . Diabetes mellitus without complication ([place])   . Hypertension   . Seizures ([place])     Past Surgical History:  Procedure Laterality Date  . CHOLECYSTECTOMY    . IR GENERIC HISTORICAL  05/16/2016   IR ANGIO VERTEBRAL SEL VERTEBRAL UNI L MOD SED 05/16/2016 [facility]  . IR GENERIC HISTORICAL  05/16/2016   IR ANGIOGRAM EXTREMITY RIGHT 05/16/2016 [facility]  . IR GENERIC HISTORICAL  05/16/2016   IR ANGIO INTRA EXTRACRAN SEL COM CAROTID INNOMINATE UNI R MOD SED 05/16/2016 [facility]  . KNEE SURGERY    . RADIOLOGY WITH ANESTHESIA N/A 06/11/2015   Procedure: RADIOLOGY WITH ANESTHESIA;  Surgeon: [name], MD;  Location: [place];  Service: Radiology;  Laterality: N/A;    There were no vitals filed for this visit.  Subjective Assessment - 08/02/19 1157    Subjective  feels shoulder is getting a little better but also has not used it as much. No pain upon arrival unless he reaches overhead    Diagnostic tests  X-ray    Patient Stated Goals  "Do anything to stop it from hurting"    Pain Onset  More than a month ago       [facility] Adult PT Treatment/Exercise - 08/02/19 0001      Shoulder Exercises: Pulleys    Flexion  2 minutes    Scaption  2 minutes      Shoulder Exercises: ROM/Strengthening   UBE (Upper Arm Bike)  L2 for 3 minutes    [place]   X10 reps flexion, then circles       Manual Therapy   Joint Mobilization  A-P, P-A, inferior glides grade 2-3    Soft tissue mobilization  STM/IASTM Lt upper trap/levator/infraspinatus,supraspinauts, deltoids    Passive ROM  Lt shoulder flexion/abduction/er/ir, more time spent on IR as he is most restricted with this                  PT Long Term Goals - 06/28/19 1329      PT LONG TERM GOAL #1   Title  pt will be independent in his HEP and progression.    Baseline  initial HEP issued on 06/28/2019    Time  6    Period  Weeks    Status  New    Target Date  08/09/19      PT LONG TERM GOAL #2   Title  Pt will improve his Left shoulder flexion to >/= 140 degrees.    Baseline  L flexion: 125 degrees on 06/28/2019    Time  6  Period  Weeks    Status  New    Target Date  08/09/19      PT LONG TERM GOAL #3   Title  Pt will be able to perform overhead reaching to hand up clothing in his closset with pain </= 2/10.    Baseline  pain can vary increaseing to 8/10 at times    Time  6    Status  New    Target Date  08/09/19      PT LONG TERM GOAL #4   Title  pt will be able to lift 10# object from floor to counter height without difficutly and with no pain reported.    Baseline  unable due to pain    Time  6    Period  Weeks    Status  New    Target Date  08/09/19      PT LONG TERM GOAL #5   Title  -    Baseline  -            Plan - 08/02/19 1215    Clinical Impression Statement  He is unsure if he wants DN at this time so insteady focused on STM/IASTM to reduce his continued tightness and trigger points. His ROM is now doing well but limited with IR ROM still and still has some pain at end range active flexion/abduction. PT will continue to progress as able.    PT Treatment/Interventions  Cryotherapy;Ultrasound;Moist  Heat;Electrical Stimulation;Therapeutic activities;Therapeutic exercise;Balance training;Neuromuscular re-education;Patient/family education;Taping;Dry needling;Passive range of motion;Manual techniques    PT Next Visit Plan  assess response to DN,  scapular stability, STW along supraspinatus, scapualr mobs    PT Home Exercise Plan  Access Code: [email or web address]    Consulted and Agree with Plan of Care  Patient       Patient will benefit from skilled therapeutic intervention in order to improve the following deficits and impairments:  Pain, Decreased strength, Postural dysfunction, Decreased activity tolerance, Decreased range of motion, Impaired UE functional use  Visit Diagnosis: Chronic left shoulder pain  Stiffness of left shoulder, not elsewhere classified  Muscle weakness (generalized)     Problem List Patient Active Problem List   Diagnosis Date Noted  . Trigger thumb, right thumb 06/08/2019  . Impingement syndrome of left shoulder 06/08/2019  . Chronic left shoulder pain 07/20/2018  . Subungual hematoma of foot, left, initial encounter 07/20/2018  . 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 Falls   . Acute deep vein thrombosis (DVT) of left femoral vein ([place]) 07/01/2015  . Pain   . Poor fluid intake   . Essential hypertension   . Type 2 diabetes mellitus with complication, without long-term current use of insulin ([place])   . Disorientation   . Convulsions ([place])   . Acute blood loss anemia   . Hypokalemia   . Ileus ([place])   . Leukocytosis   . Right knee pain   . Cognitive safety issue   . Impulsiveness   . PVC (premature ventricular contraction)   . Abdominal discomfort   . SAH (subarachnoid hemorrhage) ([place]) 06/11/2015  . Emesis   . Subarachnoid hemorrhage ([place]) 06/10/2015  . Diabetes mellitus with coincident hypertension ([place])   . Diabetes mellitus without complication [facility])     Silvestre Mesi 08/02/2019,  12:28 PM  [facility] [address] [place], [place], [address] Phone: [phone]   Fax:  [phone]  Name: Darryl [name] MRN: [id number] Date of Birth:  1951/02/17       PHYSICAL THERAPY DISCHARGE SUMMARY  Visits from Start of Care: 7  Current functional level related to goals / functional outcomes: See above   Remaining deficits: Unknown, pt hospitalized and did not return to PT   Education / Equipment: HEP  Plan: Patient agrees to discharge.  Patient goals were not met. Patient is being discharged due to not returning since the last visit.  ?????

## 2019-08-03 ENCOUNTER — Encounter: Payer: Self-pay | Admitting: Cardiology

## 2019-08-03 ENCOUNTER — Observation Stay (HOSPITAL_COMMUNITY)
Admission: EM | Admit: 2019-08-03 | Discharge: 2019-08-04 | Disposition: A | Discharge status: Home / Self Care | Payer: Medicare PPO | Attending: Cardiology | Admitting: Cardiology

## 2019-08-03 ENCOUNTER — Encounter (HOSPITAL_COMMUNITY): Payer: Self-pay | Admitting: Emergency Medicine

## 2019-08-03 ENCOUNTER — Emergency Department (HOSPITAL_COMMUNITY): Payer: Medicare PPO

## 2019-08-03 ENCOUNTER — Ambulatory Visit: Payer: Medicare PPO | Admitting: Cardiology

## 2019-08-03 VITALS — BP 127/77 | HR 62 | Temp 97.0°F | Ht 70.0 in | Wt 166.0 lb

## 2019-08-03 DIAGNOSIS — I10 Essential (primary) hypertension: Secondary | ICD-10-CM

## 2019-08-03 DIAGNOSIS — I420 Dilated cardiomyopathy: Secondary | ICD-10-CM | POA: Diagnosis not present

## 2019-08-03 DIAGNOSIS — I209 Angina pectoris, unspecified: Secondary | ICD-10-CM | POA: Diagnosis not present

## 2019-08-03 DIAGNOSIS — Z833 Family history of diabetes mellitus: Secondary | ICD-10-CM | POA: Insufficient documentation

## 2019-08-03 DIAGNOSIS — Z87891 Personal history of nicotine dependence: Secondary | ICD-10-CM | POA: Diagnosis not present

## 2019-08-03 DIAGNOSIS — G40909 Epilepsy, unspecified, not intractable, without status epilepticus: Secondary | ICD-10-CM | POA: Diagnosis not present

## 2019-08-03 DIAGNOSIS — R42 Dizziness and giddiness: Secondary | ICD-10-CM | POA: Diagnosis not present

## 2019-08-03 DIAGNOSIS — R079 Chest pain, unspecified: Secondary | ICD-10-CM | POA: Diagnosis not present

## 2019-08-03 DIAGNOSIS — E782 Mixed hyperlipidemia: Secondary | ICD-10-CM | POA: Diagnosis not present

## 2019-08-03 DIAGNOSIS — E119 Type 2 diabetes mellitus without complications: Secondary | ICD-10-CM | POA: Diagnosis not present

## 2019-08-03 DIAGNOSIS — E785 Hyperlipidemia, unspecified: Secondary | ICD-10-CM | POA: Diagnosis not present

## 2019-08-03 DIAGNOSIS — Z20822 Contact with and (suspected) exposure to covid-19: Secondary | ICD-10-CM | POA: Insufficient documentation

## 2019-08-03 DIAGNOSIS — F17210 Nicotine dependence, cigarettes, uncomplicated: Secondary | ICD-10-CM | POA: Insufficient documentation

## 2019-08-03 DIAGNOSIS — I200 Unstable angina: Principal | ICD-10-CM | POA: Insufficient documentation

## 2019-08-03 DIAGNOSIS — Z86718 Personal history of other venous thrombosis and embolism: Secondary | ICD-10-CM | POA: Insufficient documentation

## 2019-08-03 LAB — BASIC METABOLIC PANEL
Anion gap: 11 (ref 5–15)
BUN: 15 mg/dL (ref 8–23)
CO2: 27 mmol/L (ref 22–32)
Calcium: 9.8 mg/dL (ref 8.9–10.3)
Chloride: 104 mmol/L (ref 98–111)
Creatinine, Ser: 1.12 mg/dL (ref 0.61–1.24)
GFR calc Af Amer: >60 mL/min (ref >60)
GFR calc non Af Amer: >60 mL/min (ref >60)
Glucose, Bld: 126 mg/dL — ABNORMAL HIGH (ref 70–99)
Potassium: 3.7 mmol/L (ref 3.5–5.1)
Sodium: 142 mmol/L (ref 135–145)

## 2019-08-03 LAB — CBC
HCT: 43.4 % (ref 39.0–52.0)
Hemoglobin: 14.1 g/dL (ref 13.0–17.0)
MCH: 25.8 pg — ABNORMAL LOW (ref 26.0–34.0)
MCHC: 32.5 g/dL (ref 30.0–36.0)
MCV: 79.5 fL — ABNORMAL LOW (ref 80.0–100.0)
Platelets: 217 10*3/uL (ref 150–400)
RBC: 5.46 MIL/uL (ref 4.22–5.81)
RDW: 14.4 % (ref 11.5–15.5)
WBC: 6.3 10*3/uL (ref 4.0–10.5)
nRBC: 0 % (ref 0.0–0.2)

## 2019-08-03 LAB — TROPONIN I (HIGH SENSITIVITY)
Troponin I (High Sensitivity): 6 ng/L (ref <18)
Troponin I (High Sensitivity): 4 ng/L (ref <18)

## 2019-08-03 MED ORDER — ASPIRIN EC 81 MG PO TBEC: 81.0000 mg | DELAYED_RELEASE_TABLET | Freq: Every day | ORAL | 3 refills | Status: DC

## 2019-08-03 MED ORDER — ATORVASTATIN CALCIUM 20 MG PO TABS: 20.0000 mg | ORAL_TABLET | Freq: Every day | ORAL | 3 refills | Status: DC

## 2019-08-03 MED ORDER — NITROGLYCERIN 0.4 MG SL SUBL: 0.4000 mg | SUBLINGUAL_TABLET | SUBLINGUAL | 0 refills | Status: DC | PRN

## 2019-08-03 MED ORDER — SODIUM CHLORIDE 0.9% FLUSH
3.0000 mL | Freq: Once | INTRAVENOUS | Status: AC
  Administered 2019-08-04: 3 mL via INTRAVENOUS

## 2019-08-03 NOTE — Progress Notes (Addendum)
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   Patient was seen by my partner Dr. Odis Hollingshead on 08/03/2019 for outpatient consultation. While he has had stable exertional pain for past two years, he has had significant increase in the past week or so. Patient has resting bradycardia-thus unable to use BB. He has had recent use of Cialis, so unable to use NTG.   EKG reviewed. Sinus rhythm, nonspecific ST-T changes, frequent PVC's.  Will likely need admit for unstable angina. I will preemptively place orders for hospital admission, with plans for heart catheterization tomorrow.  Darryl Negus, MD [facility]. [place] Pager: [phone] Office: [phone]

## 2019-08-03 NOTE — Patient Instructions (Addendum)
Please remember to bring in your medication bottles in at the next visit.   New Medications that were added at today's visit:  Aspirin 81 mg p.o. qday Lipitor 20 mg p.o. nightly Nitro sublingual (start if needed 72hrs after your last Cialis dose).   Medications that were discontinued at today's visit: Cialis  Office will call you to have the following tests scheduled:  Echo  Please get labs done at the nearest [facility].  If the symptoms increase in intensity, frequency, or duration call 911 and go to the nearest ER via EMS.   Recommend follow up with your PCP as scheduled.

## 2019-08-03 NOTE — Progress Notes (Signed)
REASON FOR CONSULT: Chest pain and low heart rate  Chief Complaint  Patient presents with  . New Patient (Initial Visit)    Low heart rate  . Chest Pain    REQUESTING PHYSICIAN:  Lucianne Lei, MD [address] [place],  [place] [address]  HPI  Darryl [name] is a 69 y.o. male who presents to the office with a chief complaint of " chest pain and low heart rate." Patient's past medical history and cardiac risk factors include: Diet-controlled diabetes mellitus type 2, hyperlipidemia, hypertension, advanced age.   Patient is referred to the office at the request of his primary care physician for evaluation of low heart rate.  I am seeing him for the first time for the above-mentioned chief complaint.  Chest pain: Patient states that he is been having chest discomfort for couple years now but it has been getting progressively worse over the last 1 month.  It is not brought on by effort related activities and exercise.  He states that the chest pain is substernal located, pressure-like sensation, 7 out of 10 in intensity, lasts for about 10 to 15 minutes on average, he has noticed decreased endurance because of these symptoms, symptoms improved with resting.  Patient has not taken nitroglycerin tablets.  Patient is very concerned as this has been less active due to effort related symptoms of chest pain or shortness of breath.  Low heart rate/bradycardia: Patient states that he was diagnosed with bradycardia back in 2016.  It was initially attributed to his underlying conditions of aneurysm and seizures.  He is not on AV nodal blocking agents.  He is not currently taking any eyedrops.  His last TSH checked in July 2020 was 1.63.  He has lightheaded and dizziness which is chronic and stable and managed by primary care team.  Review of systems positive for: Chest pain (as described above) effort related dyspnea.  Lightheaded and dizziness (chronic and stable). Currently patient denies  palpitations, orthopnea, paroxysmal nocturnal dyspnea, lower extremity swelling, near syncope, syncopal events, hematochezia, hemoptysis, hematemesis, melanotic stools, no symptoms of amaurosis fugax, motor or sensory symptoms or dysphasia in the last 6 months.   ALLERGIES: No Known Allergies   MEDICATION LIST PRIOR TO VISIT: Current Outpatient Medications on File Prior to Visit  Medication Sig Dispense Refill  . FeFum-FePoly-FA-B Cmp-C-Biot (FOLIVANE-PLUS PO) Take by mouth.    . hydrochlorothiazide (MICROZIDE) 12.5 MG capsule Take 12.5 mg by mouth daily.    Marland Kitchen KLOR-CON M20 20 MEQ tablet TAKE 1 TABLET BY MOUTH AT BEDTIME 30 tablet 2  . lamoTRIgine (LAMICTAL) 100 MG tablet Take 100 mg by mouth 2 (two) times daily. Reported on 08/28/2015    . lamoTRIgine (LAMICTAL) 25 MG tablet Take 25 mg by mouth 2 (two) times daily.     Marland Kitchen telmisartan (MICARDIS) 40 MG tablet Take 1 tablet by mouth daily.    . Vitamin D, Ergocalciferol, (DRISDOL) 50000 UNITS CAPS capsule Take 50,000 Units by mouth every Sunday.   5   No current facility-administered medications on file prior to visit.    PAST MEDICAL HISTORY: Past Medical History:  Diagnosis Date  . Acute deep vein thrombosis (DVT) of left femoral vein ([place]) 07/01/2015  . Anxiety   . Diabetes mellitus without complication ([place])   . Epilepsy ([place])   . Erectile dysfunction   . Hyperlipidemia   . Hypertension   . Seizures ([place])     PAST SURGICAL HISTORY: Past Surgical History:  Procedure Laterality  Date  . CHOLECYSTECTOMY    . IR GENERIC HISTORICAL  05/16/2016   IR ANGIO VERTEBRAL SEL VERTEBRAL UNI L MOD SED 05/16/2016 [facility]  . IR GENERIC HISTORICAL  05/16/2016   IR ANGIOGRAM EXTREMITY RIGHT 05/16/2016 [facility]  . IR GENERIC HISTORICAL  05/16/2016   IR ANGIO INTRA EXTRACRAN SEL COM CAROTID INNOMINATE UNI R MOD SED 05/16/2016 [facility]  . KNEE SURGERY    . RADIOLOGY WITH ANESTHESIA N/A 06/11/2015   Procedure: RADIOLOGY WITH  ANESTHESIA;  Surgeon: [name], MD;  Location: [place];  Service: Radiology;  Laterality: N/A;    FAMILY HISTORY: The patient family history includes Alzheimer's disease in his mother; Aneurysm in his brother and sister; Diabetes in his father.   SOCIAL HISTORY:  The patient  reports that he quit smoking about 31 years ago. His smoking use included cigarettes. He has a 30.00 pack-year smoking history. He has never used smokeless tobacco. He reports that he does not drink alcohol or use drugs.  14 ORGAN REVIEW OF SYSTEMS: CONSTITUTIONAL: No fever or significant weight loss EYES: No recent significant visual change EARS, NOSE, MOUTH, THROAT: No recent significant change in hearing CARDIOVASCULAR: See discussion in subjective/HPI RESPIRATORY: See discussion in subjective/HPI GASTROINTESTINAL: No recent complaints of abdominal pain GENITOURINARY: No recent significant change in genitourinary status MUSCULOSKELETAL: No recent significant change in musculoskeletal status INTEGUMENTARY: No recent rash NEUROLOGIC: No recent significant change in motor function PSYCHIATRIC: No recent significant change in mood ENDOCRINOLOGIC: No recent significant change in endocrine status HEMATOLOGIC/LYMPHATIC: No recent significant unexpected bruising ALLERGIC/IMMUNOLOGIC: No recent unexplained allergic reaction  PHYSICAL EXAM: Vitals with BMI 08/03/2019 06/08/2019 12/30/2018  Height '5\' 10"'  '5\' 11"'  -  Weight 166 lbs 163 lbs -  BMI 70.48 88.91 -  Systolic 694 - 503  Diastolic 77 - 83  Pulse 62 - 56   CONSTITUTIONAL: Well-developed and well-nourished. No acute distress.  SKIN: Skin is warm and dry. No rash noted. No cyanosis. No pallor. No jaundice HEAD: Normocephalic and atraumatic.  EYES: No scleral icterus MOUTH/THROAT: Moist oral membranes.  NECK: No JVD present. No thyromegaly noted. No carotid bruits  LYMPHATIC: No visible cervical adenopathy.  CHEST Normal respiratory effort. No intercostal  retractions  LUNGS: Clear to auscultation bilaterally.  No stridor. No wheezes. No rales.  CARDIOVASCULAR: Bradycardia, regular, positive S1-S2, no murmurs rubs or gallops appreciated ABDOMINAL: Nonobese, soft, nontender, nondistended positive bowel sounds.  No apparent ascites.  EXTREMITIES: No peripheral edema  HEMATOLOGIC: No significant bruising NEUROLOGIC: Oriented to person, place, and time. Nonfocal. Normal muscle tone.  PSYCHIATRIC: Normal mood and affect. Normal behavior. Cooperative  CARDIAC DATABASE: EKG: 08/03/2019: Sinus bradycardia with ventricular rate of 54 bpm, poor R wave progression, without underlying new injury pattern.  Echocardiogram: 06/2016: - Normal LV size with mild LV hypertrophy. EF 55-60%. Normal RV size and systolic function. No significant valvular abnormalities.   Stress Testing:  None  Heart Catheterization: None  LABORATORY DATA: CBC Latest Ref Rng & Units 07/14/2017 05/16/2016 07/21/2015  WBC 4.0 - 10.5 K/uL - 5.8 7.5  Hemoglobin 13.0 - 17.0 g/dL 17.0 14.3 11.0(L)  Hematocrit 39.0 - 52.0 % 50.0 42.6 33.6(L)  Platelets 150 - 400 K/uL - 244 341    CMP Latest Ref Rng & Units 07/14/2017 05/16/2016 07/19/2015  Glucose 65 - 99 mg/dL 145(H) 142(H) 122(H)  BUN 6 - 20 mg/dL 23(H) 14 11  Creatinine 0.61 - 1.24 mg/dL 1.40(H) 1.12 0.87  Sodium 135 - 145 mmol/L 140 143 139  Potassium 3.5 - 5.1 mmol/L 3.8 4.1 4.4  Chloride 101 - 111 mmol/L 101 107 107  CO2 22 - 32 mmol/L - 29 23  Calcium 8.9 - 10.3 mg/dL - 9.5 9.5  Total Protein 6.5 - 8.1 g/dL - - -  Total Bilirubin 0.3 - 1.2 mg/dL - - -  Alkaline Phos 38 - 126 U/L - - -  AST 15 - 41 U/L - - -  ALT 17 - 63 U/L - - -    External Labs: Collected: 06/13/2018 Lipid profile: Total cholesterol 220, triglycerides 69, HDL 72, LDL 132 Hemoglobin A1c: 6.5  12/31/2018 TSH: 1.63  07/2017 serum creatinine 1.4 mg/dL.  FINAL MEDICATION LIST END OF ENCOUNTER: Meds ordered this encounter  Medications  .  atorvastatin (LIPITOR) 20 MG tablet    Sig: Take 1 tablet (20 mg total) by mouth at bedtime.    Dispense:  90 tablet    Refill:  3  . aspirin EC 81 MG tablet    Sig: Take 1 tablet (81 mg total) by mouth daily.    Dispense:  90 tablet    Refill:  3  . nitroGLYCERIN (NITROSTAT) 0.4 MG SL tablet    Sig: Place 1 tablet (0.4 mg total) under the tongue every 5 (five) minutes as needed for chest pain. If you require more than two tablets 5 mins apart call 911.    Dispense:  90 tablet    Refill:  0    Medications Discontinued During This Encounter  Medication Reason  . CIALIS 5 MG tablet Change in therapy     Current Outpatient Medications:  .  FeFum-FePoly-FA-B Cmp-C-Biot (FOLIVANE-PLUS PO), Take by mouth., Disp: , Rfl:  .  hydrochlorothiazide (MICROZIDE) 12.5 MG capsule, Take 12.5 mg by mouth daily., Disp: , Rfl:  .  KLOR-CON M20 20 MEQ tablet, TAKE 1 TABLET BY MOUTH AT BEDTIME, Disp: 30 tablet, Rfl: 2 .  lamoTRIgine (LAMICTAL) 100 MG tablet, Take 100 mg by mouth 2 (two) times daily. Reported on 08/28/2015, Disp: , Rfl:  .  lamoTRIgine (LAMICTAL) 25 MG tablet, Take 25 mg by mouth 2 (two) times daily. , Disp: , Rfl:  .  telmisartan (MICARDIS) 40 MG tablet, Take 1 tablet by mouth daily., Disp: , Rfl:  .  Vitamin D, Ergocalciferol, (DRISDOL) 50000 UNITS CAPS capsule, Take 50,000 Units by mouth every Sunday. , Disp: , Rfl: 5 .  aspirin EC 81 MG tablet, Take 1 tablet (81 mg total) by mouth daily., Disp: 90 tablet, Rfl: 3 .  atorvastatin (LIPITOR) 20 MG tablet, Take 1 tablet (20 mg total) by mouth at bedtime., Disp: 90 tablet, Rfl: 3 .  nitroGLYCERIN (NITROSTAT) 0.4 MG SL tablet, Place 1 tablet (0.4 mg total) under the tongue every 5 (five) minutes as needed for chest pain. If you require more than two tablets 5 mins apart call 911., Disp: 90 tablet, Rfl: 0  IMPRESSION:    ICD-10-CM   1. Angina pectoris (HCC)  I20.9 CMP14+EGFR    CBC    Magnesium    Troponin T    Pro b natriuretic peptide  (BNP)[id number])    atorvastatin (LIPITOR) 20 MG tablet    aspirin EC 81 MG tablet    nitroGLYCERIN (NITROSTAT) 0.4 MG SL tablet  2. Diabetes mellitus with coincident hypertension (HCC)  E11.9 atorvastatin (LIPITOR) 20 MG tablet   I10   3. Essential hypertension  I10   4. Dizziness  R42 EKG 12-Lead  5. Former smoker  [phone]  6. Mixed hyperlipidemia  E78.2 atorvastatin (LIPITOR) 20 MG tablet     RECOMMENDATIONS: Darryl [name] is a 69 y.o. male whose past medical history and cardiac risk factors include: Diet-controlled diabetes mellitus type 2, hyperlipidemia, hypertension, advanced age.   Angina pectoris:  Given the patient's symptoms of typical chest pain suggestive of angina pectoris and multiple cardiovascular risk factors recommended further ischemic evaluation.  Patient's pretest probability of having CAD is high and therefore recommended a left heart catheterization.  We also discussed transferring him to ER via EMS given his anginal symptoms and multiple cardiovascular risk factors however patient refused and he would like to schedule a left heart catheterization outpatient.  Patient states that he will discuss recommendations with his wife and possibly seek medical attention sooner at [facility] health.  However, patient verbalized understanding that if his symptoms increase in intensity, frequency, or duration he will seek medical attention sooner at the closest ER via EMS.  EKG shows sinus bradycardia without underlying ischemia or injury pattern.   We will check CBC, CMP, magnesium level, proBNP, and troponin levels.  If the cardiac biomarkers are abnormal he will be recommended to go to the closest ER via EMS.  Patient verbalizes understanding.  The left heart catheterization procedure was explained to the patient in detail. The indication, alternatives, risks and benefits were reviewed. Complications including but not limited to bleeding, infection, acute  kidney injury, blood transfusion, heart rhythm disturbances, contrast (dye) reaction, damage to the arteries or nerves in the legs or hands, cerebrovascular accident, myocardial infarction, need for emergent bypass surgery, blood clots in the legs, possible need for emergent blood transfusion, and rarely death were reviewed and discussed with the patient. The patient voices understanding and wishes to proceed.  Start aspirin 81 mg p.o. daily until the work-up is complete for now.  Patient estimated 10-year risk of ASCVD is greater than 10%, start Lipitor 20 mg p.o. nightly and will uptitrate.  We will also prescribe sublingual nitroglycerin for as needed basis.  Patient's last dose of Cialis was earlier this morning.  He is instructed not to use sublingual nitroglycerin tablets for the next 72 hours.  Drug to drug interactions discussed and patient verbalized understanding.  Medication profile discussed with the patient.  Discontinue Cialis.   Bradycardia / Dizziness:  Patient reports wearing a 1 month monitor ordered by his primary care physician, results are currently pending.  Currently not on AV blocking agents.  Continue to monitor  Mixed hyperlipidemia:  Patient estimated 10-year risk of ASCVD is greater than 10%, start Lipitor 20 mg p.o. nightly and will uptitrate.  Benign essential hypertension: . Office blood pressure is at goal.  . Medication reconciled.  . Patient is asked to keep a log of both blood pressure and pulse so that medications can be titrated based on a blood pressure trend as opposed to isolated blood pressure readings in the office. . If the blood pressure is consistently greater than 131mHg patient is asked to call the office to for medication titration sooner than the next office visit.  . Low salt diet recommended. A diet that is rich in fruits, vegetables, legumes, and low-fat dairy products and low in snacks, sweets, and meats (such as the Dietary Approaches to  Stop Hypertension [DASH] diet).   Non-insulin-dependent diabetes mellitus type 2, diet controlled: Management per primary care team.  Orders Placed This Encounter  Procedures  . CMP14+EGFR  . CBC  . Magnesium  . Troponin T  . Pro b  natriuretic peptide (BNP)[facility])  . EKG 12-Lead   Evaluation and management (73mn) with time spent obtaining history, performing exam, reviewing labs, studies, outside records, greater than 50% of that time was spent in counseling and coordination care with the patient regarding complex decision making and discussion as state above, and documenting clinical evaluation.  --Continue cardiac medications as reconciled in final medication list. --Return in about 2 weeks (around 08/17/2019).. Or sooner if needed. --Continue follow-up with your primary care physician regarding the management of your other chronic comorbid conditions.  Patient's questions and concerns were addressed to his satisfaction. He voices understanding of the instructions provided during this encounter.   This note was created using a voice recognition software as a result there may be grammatical errors inadvertently enclosed that do not reflect the nature of this encounter. Every attempt is made to correct such errors.  SRex Kras DO, FAkronCardiovascular. PHammondsportOffice: [phone]

## 2019-08-03 NOTE — ED Triage Notes (Signed)
Pt here from home with c/o chest pain off and on for 2 weeks , pt was seen by cards last week , pt has had some sob with the pain

## 2019-08-04 ENCOUNTER — Encounter (HOSPITAL_COMMUNITY)
Admission: EM | Disposition: A | Discharge status: Home / Self Care | Payer: Self-pay | Source: Home / Self Care | Attending: Emergency Medicine

## 2019-08-04 ENCOUNTER — Observation Stay (HOSPITAL_COMMUNITY): Payer: Medicare PPO

## 2019-08-04 ENCOUNTER — Encounter: Payer: Medicare PPO | Admitting: Physical Therapy

## 2019-08-04 DIAGNOSIS — I200 Unstable angina: Secondary | ICD-10-CM | POA: Diagnosis present

## 2019-08-04 DIAGNOSIS — Z20822 Contact with and (suspected) exposure to covid-19: Secondary | ICD-10-CM | POA: Diagnosis not present

## 2019-08-04 DIAGNOSIS — R079 Chest pain, unspecified: Secondary | ICD-10-CM | POA: Diagnosis present

## 2019-08-04 HISTORY — PX: LEFT HEART CATH AND CORONARY ANGIOGRAPHY: CATH118249

## 2019-08-04 LAB — ECHOCARDIOGRAM COMPLETE
Height: 70 in
Weight: 2656 oz

## 2019-08-04 LAB — RESPIRATORY PANEL BY RT PCR (FLU A&B, COVID)
Influenza A by PCR: NEGATIVE
Influenza B by PCR: NEGATIVE
SARS Coronavirus 2 by RT PCR: NEGATIVE

## 2019-08-04 LAB — HIV ANTIBODY (ROUTINE TESTING W REFLEX): HIV Screen 4th Generation wRfx: NONREACTIVE

## 2019-08-04 LAB — POCT ACTIVATED CLOTTING TIME: Activated Clotting Time: 136 seconds

## 2019-08-04 LAB — GLUCOSE, CAPILLARY: Glucose-Capillary: 110 mg/dL — ABNORMAL HIGH (ref 70–99)

## 2019-08-04 SURGERY — LEFT HEART CATH AND CORONARY ANGIOGRAPHY
Anesthesia: LOCAL

## 2019-08-04 MED ORDER — HEPARIN SODIUM (PORCINE) 1000 UNIT/ML IJ SOLN
INTRAMUSCULAR | Status: DC | PRN
  Administered 2019-08-04: 4000 [IU] via INTRAVENOUS

## 2019-08-04 MED ORDER — VERAPAMIL HCL 2.5 MG/ML IV SOLN
INTRAVENOUS | Status: DC | PRN
  Administered 2019-08-04: 10 mL via INTRA_ARTERIAL

## 2019-08-04 MED ORDER — ASPIRIN EC 81 MG PO TBEC: 81.0000 mg | DELAYED_RELEASE_TABLET | Freq: Every day | ORAL | Status: DC

## 2019-08-04 MED ORDER — LABETALOL HCL 5 MG/ML IV SOLN: 10.0000 mg | INTRAVENOUS | Status: DC | PRN

## 2019-08-04 MED ORDER — MIDAZOLAM HCL 2 MG/2ML IJ SOLN
INTRAMUSCULAR | Status: AC
  Filled 2019-08-04: qty 2

## 2019-08-04 MED ORDER — MIDAZOLAM HCL 2 MG/2ML IJ SOLN
INTRAMUSCULAR | Status: DC | PRN
  Administered 2019-08-04: 1 mg via INTRAVENOUS

## 2019-08-04 MED ORDER — SODIUM CHLORIDE 0.9 % WEIGHT BASED INFUSION: 3.0000 mL/kg/h | INTRAVENOUS | Status: DC

## 2019-08-04 MED ORDER — SODIUM CHLORIDE 0.9 % IV SOLN: 250.0000 mL | INTRAVENOUS | Status: DC | PRN

## 2019-08-04 MED ORDER — SODIUM CHLORIDE 0.9% FLUSH: 3.0000 mL | INTRAVENOUS | Status: DC | PRN

## 2019-08-04 MED ORDER — IOHEXOL 350 MG/ML SOLN
INTRAVENOUS | Status: DC | PRN
  Administered 2019-08-04: 75 mL

## 2019-08-04 MED ORDER — SODIUM CHLORIDE 0.9 % IV SOLN: INTRAVENOUS | Status: AC

## 2019-08-04 MED ORDER — ONDANSETRON HCL 4 MG/2ML IJ SOLN: 4.0000 mg | Freq: Four times a day (QID) | INTRAMUSCULAR | Status: DC | PRN

## 2019-08-04 MED ORDER — VERAPAMIL HCL 2.5 MG/ML IV SOLN
INTRAVENOUS | Status: AC
  Filled 2019-08-04: qty 2

## 2019-08-04 MED ORDER — ASPIRIN 300 MG RE SUPP: 300.0000 mg | RECTAL | Status: AC

## 2019-08-04 MED ORDER — ATORVASTATIN CALCIUM 10 MG PO TABS: 20.0000 mg | ORAL_TABLET | Freq: Every day | ORAL | Status: DC

## 2019-08-04 MED ORDER — ASPIRIN 81 MG PO CHEW: 81.0000 mg | CHEWABLE_TABLET | ORAL | Status: DC

## 2019-08-04 MED ORDER — FENTANYL CITRATE (PF) 100 MCG/2ML IJ SOLN
INTRAMUSCULAR | Status: AC
  Filled 2019-08-04: qty 2

## 2019-08-04 MED ORDER — LIDOCAINE HCL (PF) 1 % IJ SOLN
INTRAMUSCULAR | Status: AC
  Filled 2019-08-04: qty 30

## 2019-08-04 MED ORDER — SODIUM CHLORIDE 0.9% FLUSH: 3.0000 mL | Freq: Two times a day (BID) | INTRAVENOUS | Status: DC

## 2019-08-04 MED ORDER — HEPARIN SODIUM (PORCINE) 1000 UNIT/ML IJ SOLN
INTRAMUSCULAR | Status: AC
  Filled 2019-08-04: qty 1

## 2019-08-04 MED ORDER — SODIUM CHLORIDE 0.9 % WEIGHT BASED INFUSION
1.0000 mL/kg/h | INTRAVENOUS | Status: DC
  Administered 2019-08-04: 1 mL/kg/h via INTRAVENOUS

## 2019-08-04 MED ORDER — LAMOTRIGINE 25 MG PO TABS
125.0000 mg | ORAL_TABLET | Freq: Two times a day (BID) | ORAL | Status: DC
  Filled 2019-08-04: qty 1

## 2019-08-04 MED ORDER — HYDRALAZINE HCL 20 MG/ML IJ SOLN: 10.0000 mg | INTRAMUSCULAR | Status: DC | PRN

## 2019-08-04 MED ORDER — ACETAMINOPHEN 325 MG PO TABS: 650.0000 mg | ORAL_TABLET | ORAL | Status: DC | PRN

## 2019-08-04 MED ORDER — ASPIRIN 81 MG PO CHEW
324.0000 mg | CHEWABLE_TABLET | ORAL | Status: AC
  Administered 2019-08-04: 324 mg via ORAL
  Filled 2019-08-04: qty 4

## 2019-08-04 MED ORDER — LIDOCAINE HCL (PF) 1 % IJ SOLN
INTRAMUSCULAR | Status: DC | PRN
  Administered 2019-08-04: 2 mL

## 2019-08-04 MED ORDER — FENTANYL CITRATE (PF) 100 MCG/2ML IJ SOLN
INTRAMUSCULAR | Status: DC | PRN
  Administered 2019-08-04: 50 ug via INTRAVENOUS

## 2019-08-04 MED ORDER — HEPARIN (PORCINE) 25000 UT/250ML-% IV SOLN
1050.0000 [IU]/h | INTRAVENOUS | Status: DC
  Administered 2019-08-04: 1050 [IU]/h via INTRAVENOUS
  Filled 2019-08-04: qty 250

## 2019-08-04 MED ORDER — HEPARIN BOLUS VIA INFUSION
4000.0000 [IU] | Freq: Once | INTRAVENOUS | Status: AC
  Administered 2019-08-04: 4000 [IU] via INTRAVENOUS
  Filled 2019-08-04: qty 4000

## 2019-08-04 MED ORDER — HEPARIN (PORCINE) IN NACL 1000-0.9 UT/500ML-% IV SOLN
INTRAVENOUS | Status: AC
  Filled 2019-08-04: qty 1000

## 2019-08-04 MED ORDER — SODIUM CHLORIDE 0.9 % WEIGHT BASED INFUSION: 1.0000 mL/kg/h | INTRAVENOUS | Status: DC

## 2019-08-04 MED ORDER — SODIUM CHLORIDE 0.9 % WEIGHT BASED INFUSION: 3.0000 mL/kg/h | INTRAVENOUS | Status: AC

## 2019-08-04 MED ORDER — HEPARIN (PORCINE) IN NACL 1000-0.9 UT/500ML-% IV SOLN
INTRAVENOUS | Status: DC | PRN
  Administered 2019-08-04 (×2): 500 mL

## 2019-08-04 SURGICAL SUPPLY — 16 items
CATH DXT MULTI JL4 JR4 ANG PIG (CATHETERS) ×1 IMPLANT
CATH OPTITORQUE TIG 4.0 5F (CATHETERS) ×2 IMPLANT
DEVICE RAD COMP TR BAND LRG (VASCULAR PRODUCTS) ×2 IMPLANT
GLIDESHEATH SLEND A-KIT 6F 22G (SHEATH) ×1 IMPLANT
GUIDEWIRE INQWIRE 1.5J.035X260 (WIRE) ×1 IMPLANT
INQWIRE 1.5J .035X260CM (WIRE) ×2
KIT HEART LEFT (KITS) ×2 IMPLANT
KIT MICROPUNCTURE NIT STIFF (SHEATH) ×2 IMPLANT
PACK CARDIAC CATHETERIZATION (CUSTOM PROCEDURE TRAY) ×2 IMPLANT
SHEATH PINNACLE 5F 10CM (SHEATH) ×1 IMPLANT
SHEATH PROBE COVER 6X72 (BAG) ×1 IMPLANT
SYR MEDRAD MARK 7 150ML (SYRINGE) ×2 IMPLANT
TRANSDUCER W/STOPCOCK (MISCELLANEOUS) ×2 IMPLANT
TUBING CIL FLEX 10 FLL-RA (TUBING) ×2 IMPLANT
WIRE EMERALD 3MM-J .035X150CM (WIRE) ×1 IMPLANT
WIRE HI TORQ VERSACORE-J 145CM (WIRE) ×1 IMPLANT

## 2019-08-04 NOTE — ED Notes (Signed)
THE PT IS UNHAPPY THAT HE HAS NOT BEEN TAKEN TO A ROOM YET  HE WAS TOLD BY HIS DOCTOR THAT IF HE CAME TO THE ED THAT HE WOULD BE ADMITTED AND HE HAS BEEN HERE SINCE 2000  I ATTEMPTED TO EXPLAIN THAT THERE WERE NO ROOM IN THE BACK AND HE WOULD HAVE TO SEE THE ED DOCTOR AND THAT THERE WERE NO BEDS UPSTAIRS  HOLDING IN THE ED.  HE REPLIED THAT HE WOULD CALL HIS WIFE AND ASK HER TO COME GET HIM  AT LEAST HE COULD BE COOMFORTABLE.  CHARGE NURSE CALLIE DISCUSSED

## 2019-08-04 NOTE — Progress Notes (Addendum)
Discharge instructions reviewed with pt and his wife (via telephone) both voice understanding.  

## 2019-08-04 NOTE — ED Provider Notes (Signed)
[facility] EMERGENCY DEPARTMENT Provider Note   CSN: [id number] Arrival date & time: 08/03/19  1925     History No chief complaint on file.   Darryl [name] is a 69 y.o. male.  The history is provided by the patient.  Chest Pain Pain location:  Substernal area Pain quality: pressure   Pain radiates to:  Does not radiate Pain severity:  Moderate Timing:  Intermittent Progression:  Resolved Chronicity:  New Relieved by:  Rest Worsened by:  Exertion Associated symptoms: shortness of breath   Associated symptoms: no fever and no vomiting   Risk factors: high cholesterol, hypertension and male sex   Patient presents for chest pain.  He has had chest pain intermittently and is worsening.  He reports it is worse with exertion, improves with rest.  He is now currently chest pain-free.     Past Medical History:  Diagnosis Date  . Acute deep vein thrombosis (DVT) of left femoral vein ([place]) 07/01/2015  . Anxiety   . Diabetes mellitus without complication ([place])   . Epilepsy ([place])   . Erectile dysfunction   . Hyperlipidemia   . Hypertension   . Seizures [facility])     Patient Active Problem List   Diagnosis Date Noted  . Unstable angina ([place]) 08/04/2019  . Trigger thumb, right thumb 06/08/2019  . Impingement syndrome of left shoulder 06/08/2019  . Chronic left shoulder pain 07/20/2018  . Subungual hematoma of foot, left, initial encounter 07/20/2018  . 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 Falls   . Acute deep vein thrombosis (DVT) of left femoral vein ([place]) 07/01/2015  . Pain   . Poor fluid intake   . Essential hypertension   . Type 2 diabetes mellitus with complication, without long-term current use of insulin ([place])   . Disorientation   . Convulsions ([place])   . Acute blood loss anemia   . Hypokalemia   . Ileus ([place])   . Leukocytosis   . Right knee pain   . Cognitive safety issue   .  Impulsiveness   . PVC (premature ventricular contraction)   . Abdominal discomfort   . SAH (subarachnoid hemorrhage) ([place]) 06/11/2015  . Emesis   . Subarachnoid hemorrhage ([place]) 06/10/2015  . Diabetes mellitus with coincident hypertension ([place])   . Diabetes mellitus without complication [facility])     Past Surgical History:  Procedure Laterality Date  . CHOLECYSTECTOMY    . IR GENERIC HISTORICAL  05/16/2016   IR ANGIO VERTEBRAL SEL VERTEBRAL UNI L MOD SED 05/16/2016 [facility]  . IR GENERIC HISTORICAL  05/16/2016   IR ANGIOGRAM EXTREMITY RIGHT 05/16/2016 [facility]  . IR GENERIC HISTORICAL  05/16/2016   IR ANGIO INTRA EXTRACRAN SEL COM CAROTID INNOMINATE UNI R MOD SED 05/16/2016 [facility]  . KNEE SURGERY    . RADIOLOGY WITH ANESTHESIA N/A 06/11/2015   Procedure: RADIOLOGY WITH ANESTHESIA;  Surgeon: [name], MD;  Location: [facility];  Service: Radiology;  Laterality: N/A;       Family History  Problem Relation Age of Onset  . Alzheimer's disease Mother   . Diabetes Father   . Aneurysm Brother   . Aneurysm Sister     Social History   Tobacco Use  . Smoking status: Former Smoker    Packs/day: 2.00    Years: 15.00    Pack years: 30.00    Types: Cigarettes    Quit date: 06/02/1988    Years since quitting: 31.1  .  Smokeless tobacco: Never Used  Substance Use Topics  . Alcohol use: No  . Drug use: No    Home Medications Prior to Admission medications   Medication Sig Start Date End Date Taking? Authorizing Provider  atorvastatin (LIPITOR) 20 MG tablet Take 1 tablet (20 mg total) by mouth at bedtime. 08/03/19 11/01/19 Yes Tolia, Sunit, DO  FeFum-FePoly-FA-B Cmp-C-Biot (FOLIVANE-PLUS) CAPS Take 1 capsule by mouth 2 (two) times daily. 06/17/19  Yes [provider]  KLOR-CON M20 20 MEQ tablet TAKE 1 TABLET BY MOUTH AT BEDTIME Patient taking differently: Take 20 mEq by mouth at bedtime.  10/22/15  Yes Ranelle Oyster, MD  lamoTRIgine (LAMICTAL) 100 MG tablet  Take 100 mg by mouth 2 (two) times daily. Take with Lamotrigine 25 mg to equal 125 mg   Yes [provider]  lamoTRIgine (LAMICTAL) 25 MG tablet Take 25 mg by mouth 2 (two) times daily. Take with lamotrigine 100 mg to equal 125 mg   Yes [provider]  nitroGLYCERIN (NITROSTAT) 0.4 MG SL tablet Place 1 tablet (0.4 mg total) under the tongue every 5 (five) minutes as needed for chest pain. If you require more than two tablets 5 mins apart call 911. 08/03/19 11/01/19 Yes Tolia, Sunit, DO  tadalafil (CIALIS) 5 MG tablet Take 5 mg by mouth daily.   Yes [provider]  telmisartan-hydrochlorothiazide (MICARDIS HCT) 40-12.5 MG tablet Take 1 tablet by mouth daily. 06/30/19  Yes [provider]  Vitamin D, Ergocalciferol, (DRISDOL) 50000 UNITS CAPS capsule Take 50,000 Units by mouth every Sunday.  05/05/15  Yes [provider]  aspirin EC 81 MG tablet Take 1 tablet (81 mg total) by mouth daily. 08/03/19   Tolia, Sunit, DO    Allergies    Patient has no known allergies.  Review of Systems   Review of Systems  Constitutional: Negative for fever.  Respiratory: Positive for shortness of breath.   Cardiovascular: Positive for chest pain.  Gastrointestinal: Negative for vomiting.  All other systems reviewed and are negative.   Physical Exam Updated Vital Signs BP (!) 145/77   Pulse (!) 55   Temp 97.9 F (36.6 C) (Oral)   Resp 19   SpO2 100%   Physical Exam CONSTITUTIONAL: Well developed, appears younger than stated age HEAD: Normocephalic/atraumatic EYES: EOMI ENMT: Mask in place NECK: supple no meningeal signs SPINE/BACK:entire spine nontender CV: S1/S2 noted, soft murmur noted LUNGS: Lungs are clear to auscultation bilaterally, no apparent distress ABDOMEN: soft, nontender, no rebound or guarding, bowel sounds noted throughout abdomen GU:no cva tenderness NEURO: Pt is awake/alert/appropriate, moves all extremitiesx4.  No facial droop.     EXTREMITIES: pulses normal/equalx4, full ROM SKIN: warm, color normal PSYCH: no abnormalities of mood noted, alert and oriented to situation  ED Results / Procedures / Treatments   Labs (all labs ordered are listed, but only abnormal results are displayed) Labs Reviewed  BASIC METABOLIC PANEL - Abnormal; Notable for the following components:      Result Value   Glucose, Bld 126 (*)    All other components within normal limits  CBC - Abnormal; Notable for the following components:   MCV 79.5 (*)    MCH 25.8 (*)    All other components within normal limits  RESPIRATORY PANEL BY RT PCR (FLU A&B, COVID)  HIV ANTIBODY (ROUTINE TESTING W REFLEX)  HEPARIN LEVEL (UNFRACTIONATED)  TROPONIN I (HIGH SENSITIVITY)  TROPONIN I (HIGH SENSITIVITY)    EKG EKG Interpretation  Date/Time:  Thursday August 04 2019 04:32:02 EST Ventricular Rate:  57 PR Interval:  146 QRS Duration: 99 QT Interval:  409 QTC Calculation: 399 R Axis:   33 Text Interpretation: Sinus rhythm No significant change since last tracing Confirmed by Zadie Rhine ([id number]) on 08/04/2019 4:35:00 AM   Radiology DG Chest 2 View  Result Date: 08/03/2019 CLINICAL DATA:  Chest pain for 2 weeks EXAM: CHEST - 2 VIEW COMPARISON:  11/30/2018 FINDINGS: The heart size and mediastinal contours are within normal limits. Both lungs are clear. The visualized skeletal structures are unremarkable. Stable compression deformity is noted in the midthoracic spine. IMPRESSION: No active cardiopulmonary disease. Electronically Signed   By: Alcide Clever M.D.   On: 08/03/2019 20:18    Procedures Procedures  Medications Ordered in ED Medications  aspirin EC tablet 81 mg (has no administration in time range)  acetaminophen (TYLENOL) tablet 650 mg (has no administration in time range)  ondansetron (ZOFRAN) injection 4 mg (has no administration in time range)  sodium chloride flush (NS) 0.9 % injection 3 mL (has no administration in time range)   sodium chloride flush (NS) 0.9 % injection 3 mL (has no administration in time range)  0.9 %  sodium chloride infusion (has no administration in time range)  aspirin chewable tablet 81 mg (has no administration in time range)  0.9% sodium chloride infusion (has no administration in time range)    Followed by  0.9% sodium chloride infusion (1 mL/kg/hr  75.3 kg Intravenous New Bag/Given 08/04/19 0455)  heparin ADULT infusion 100 units/mL (25000 units/253mL sodium chloride 0.45%) (1,050 Units/hr Intravenous New Bag/Given 08/04/19 0454)  sodium chloride flush (NS) 0.9 % injection 3 mL (3 mLs Intravenous Given 08/04/19 0433)  aspirin chewable tablet 324 mg (324 mg Oral Given 08/04/19 0432)    Or  aspirin suppository 300 mg ( Rectal See Alternative 08/04/19 0432)  heparin bolus via infusion 4,000 Units (4,000 Units Intravenous Bolus from Bag 08/04/19 0456)    ED Course  I have reviewed the triage vital signs and the nursing notes.  Pertinent labs & imaging results that were available during my care of the patient were reviewed by me and considered in my medical decision making (see chart for details).    MDM Rules/Calculators/A&P                      Patient was sent to the ER by his cardiologist.  I discussed the case with Dr. Basilia Jumbo from cardiology.  He is already placing admit orders, and will likely get cardiac catheterization today All orders were placed by cardiology.  Patient is currently chest pain-free.  Final Clinical Impression(s) / ED Diagnoses Final diagnoses:  Unstable angina [facility])    Rx / DC Orders ED Discharge Orders    None       Zadie Rhine, MD 08/04/19 [phone]

## 2019-08-04 NOTE — Care Management CC44 (Signed)
Condition Code 44 Documentation Completed  Patient Details  Name: Kennet Mccort MRN: [id number] Date of Birth: [birth year]   Condition Code 44 given:  Yes Patient signature on Condition Code 44 notice:  Yes Documentation of 2 MD's agreement:  Yes Code 44 added to claim:  Yes    Oletta Cohn, RN 08/04/2019, 11:54 AM

## 2019-08-04 NOTE — Progress Notes (Signed)
Site area: Right groin a 5 french arterial sheath was removed  Site Prior to Removal:  Level 0  Pressure Applied For 20 MINUTES    Bedrest Beginning at 1145am  Manual:   Yes.    Patient Status During Pull:  stable  Post Pull Groin Site:  Level 0  Post Pull Instructions Given:  Yes.    Post Pull Pulses Present:  Yes.    Dressing Applied:  Yes.    Comments:    

## 2019-08-04 NOTE — Progress Notes (Signed)
ANTICOAGULATION CONSULT NOTE - Initial Consult  Pharmacy Consult for Heparin Indication: chest pain/ACS  No Known Allergies  Patient Measurements:    Ht 70 in    Wt 75 kg  Vital Signs: Temp: 97.9 F (36.6 C) (03/04 0215) Temp Source: Oral (03/04 0215) BP: 150/76 (03/04 0407) Pulse Rate: 50 (03/04 0407)  Labs: Recent Labs    08/03/19 1942 08/03/19 2207  HGB 14.1  --   HCT 43.4  --   PLT 217  --   CREATININE 1.12  --   TROPONINIHS 6 4    Estimated Creatinine Clearance: 66.1 mL/min (by C-G formula based on SCr of 1.12 mg/dL).   Medical History: Past Medical History:  Diagnosis Date  . Acute deep vein thrombosis (DVT) of left femoral vein ([facility]) 07/01/2015  . Anxiety   . Diabetes mellitus without complication ([facility])   . Epilepsy ([facility])   . Erectile dysfunction   . Hyperlipidemia   . Hypertension   . Seizures ([facility])     Medications:  See electronic med rec  Assessment: 69 y.o. M presents with [place]. To begin heparin per pharmacy. No AC PTA. Plans for cath today.  Goal of Therapy:  Heparin level 0.3-0.7 units/ml Monitor platelets by anticoagulation protocol: Yes   Plan:  Heparin IV bolus 4000 units Heparin gtt at 1050 units/hr Will f/u heparin level in 6 hours Daily heparin level and CBC  Christoper Fabian, PharmD, BCPS Please see amion for complete clinical pharmacist phone list 08/04/2019,4:28 AM

## 2019-08-04 NOTE — Discharge Summary (Signed)
Physician Discharge Summary  Patient ID: Bond Grieshop MRN: [id number] DOB/AGE: 06-04-1950 69 y.o.  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   Given his risk factors, exertional nature of chest pain, with recent worsening, unstable angina suspected.  Given inability to use beta-blocker due to resting bradycardia and nitroglycerin due to ongoing use of Cialis, card catheterization was recommended.  Coronary angiogram showed small to medium caliber tortuous vessels without any angiographically significant coronary artery disease.  LVEF was normal on ventriculogram.  Due to absence of troponin elevation, there is no evidence of myocardial ischemia.  Thus, microvascular ischemia is less likely.  Consider noncardiac etiology for his chest pain.  Given no angiographically evident plaque, I have stopped his aspirin.  Patient will be discharged home today.   Discharge Exam: Blood pressure (!) 152/64, pulse (!) 54, temperature 97.9 F (36.6 C), temperature source Oral, resp. rate 20, SpO2 99 %.   Physical Exam  Constitutional: He is oriented to person, place, and time. He appears well-developed and well-nourished. No distress.  HENT:  Head: Normocephalic and atraumatic.  Eyes: Pupils are equal, round, and reactive to light. Conjunctivae are normal.  Neck: No JVD present.  Cardiovascular: Normal rate, regular rhythm and intact distal pulses.  Pulmonary/Chest: Effort normal and breath sounds normal. He has no wheezes. He has no rales.  Abdominal: Soft. Bowel sounds are normal. There is no rebound.  Musculoskeletal:        General: No edema.   Lymphadenopathy:    He has no cervical adenopathy.  Neurological: He is alert and oriented to person, place, and time. No cranial nerve deficit.  Skin: Skin is warm and dry.  Psychiatric: He has a normal mood and affect.  Nursing note and vitals reviewed.     Significant Diagnostic Studies:  EKG 08/04/2019: Sinus rhythm 57 bpm.  Normal EKG.  Coronary angiography 08/04/2019: Right dominant circulation with tortuous, small-medium caliber vessels. TIMI III flow. No angiographically significant coronary artery disease.  Normal LVEDP. Normal LVEF.  Consider noncardiac etiology for chest pain.   Labs:   Lab Results  Component Value Date   WBC 6.3 08/03/2019   HGB 14.1 08/03/2019   HCT 43.4 08/03/2019   MCV 79.5 (L) 08/03/2019   PLT 217 08/03/2019    Recent Labs  Lab 08/03/19 1942  NA 142  K 3.7  CL 104  CO2 27  BUN 15  CREATININE 1.12  CALCIUM 9.8  GLUCOSE 126*    Lipid Panel     Component Value Date/Time   CHOL  10/02/2009 0615    166        ATP III CLASSIFICATION:  <200     mg/dL   Desirable  200-239  mg/dL   Borderline High  >=240    mg/dL   High          TRIG 80 06/12/2015 0724   HDL 62 10/02/2009 0615   CHOLHDL 2.7 10/02/2009 0615   VLDL 7 10/02/2009 0615   LDLCALC  10/02/2009 0615    97        Total Cholesterol/HDL:CHD Risk Coronary Heart Disease Risk Table  Men   Women  1/2 Average Risk   3.4   3.3  Average Risk       5.0   4.4  2 X Average Risk   9.6   7.1  3 X Average Risk  23.4   11.0        Use the calculated Patient Ratio above and the CHD Risk Table to determine the patient's CHD Risk.        ATP III CLASSIFICATION (LDL):  <100     mg/dL   Optimal  299-242  mg/dL   Near or Above                    Optimal  130-159  mg/dL   Borderline  683-419  mg/dL   High  >622     mg/dL   Very High     Results for TENG, DECOU (MRN [id number]) as of 08/04/2019 11:06  Ref. Range 08/03/2019 19:42 08/03/2019 22:07  Troponin I  (High Sensitivity) Latest Ref Range: <18 ng/L 6 4    Radiology: DG Chest 2 View  Result Date: 08/03/2019 CLINICAL DATA:  Chest pain for 2 weeks EXAM: CHEST - 2 VIEW COMPARISON:  11/30/2018 FINDINGS: The heart size and mediastinal contours are within normal limits. Both lungs are clear. The visualized skeletal structures are unremarkable. Stable compression deformity is noted in the midthoracic spine. IMPRESSION: No active cardiopulmonary disease. Electronically Signed   By: Alcide Clever M.D.   On: 08/03/2019 20:18   CARDIAC CATHETERIZATION  Result Date: 08/04/2019 Right dominant circulation with tortuous, small-medium caliber vessels. TIMI III flow. No angiographically significant coronary artery disease. Normal LVEDP. Normal LVEF. Consider noncardiac etiology for chest pain. Elder Negus, MD [facility] Pager: [phone] Office: [phone]       FOLLOW UP PLANS AND APPOINTMENTS Discharge Instructions    Diet - low sodium heart healthy   Complete by: As directed    Increase activity slowly   Complete by: As directed      Allergies as of 08/04/2019   No Known Allergies     Medication List    STOP taking these medications   aspirin EC 81 MG tablet     TAKE these medications   atorvastatin 20 MG tablet Commonly known as: LIPITOR Take 1 tablet (20 mg total) by mouth at bedtime.   Folivane-Plus Caps Take 1 capsule by mouth 2 (two) times daily.   Klor-Con M20 20 MEQ tablet Generic drug: potassium chloride SA TAKE 1 TABLET BY MOUTH AT BEDTIME What changed: how much to take   lamoTRIgine 25 MG tablet Commonly known as: LAMICTAL Take 25 mg by mouth 2 (two) times daily. Take with lamotrigine 100 mg to equal 125 mg   lamoTRIgine 100 MG tablet Commonly known as: LAMICTAL Take 100 mg by mouth 2 (two) times daily. Take with Lamotrigine 25 mg to equal 125 mg   nitroGLYCERIN 0.4 MG SL tablet Commonly known as: Nitrostat Place 1 tablet (0.4 mg total)  under the tongue every 5 (five) minutes as needed for chest pain. If you require more than two tablets 5 mins apart call 911.   tadalafil 5 MG tablet Commonly known as: CIALIS Take 5 mg by mouth daily.   telmisartan-hydrochlorothiazide 40-12.5 MG tablet Commonly known as: MICARDIS HCT Take 1 tablet by mouth daily.   Vitamin D (Ergocalciferol) 1.25 MG (50000 UNIT) Caps capsule Commonly known as: DRISDOL Take 50,000 Units by mouth every Sunday.  Follow-up Information    Tessa Lerner, DO Follow up on 08/18/2019.   Specialties: Cardiology, Radiology, Vascular Surgery Why: 10:00 AM Contact information: [address] [place] [address] [phone]            Truett Mainland MD, [facility] Pager: [phone] Office: [phone] If no answer: [phone]

## 2019-08-04 NOTE — Progress Notes (Signed)
Echocardiogram 2D Echocardiogram has been performed.  Darryl [name] 08/04/2019, 1:51 PM

## 2019-08-04 NOTE — ED Notes (Signed)
[facility] called to page on call per Dr. Hal Neer request

## 2019-08-04 NOTE — Discharge Instructions (Signed)
Radial Site Care  This sheet gives you information about how to care for yourself after your procedure. Your health care provider may also give you more specific instructions. If you have problems or questions, contact your health care provider. What can I expect after the procedure? After the procedure, it is common to have:  Bruising and tenderness at the catheter insertion area. Follow these instructions at home: Medicines  Take over-the-counter and prescription medicines only as told by your health care provider. Insertion site care  Follow instructions from your health care provider about how to take care of your insertion site. Make sure you: ? Wash your hands with soap and water before you change your bandage (dressing). If soap and water are not available, use hand sanitizer. ? Change your dressing as told by your health care provider. ? Leave stitches (sutures), skin glue, or adhesive strips in place. These skin closures may need to stay in place for 2 weeks or longer. If adhesive strip edges start to loosen and curl up, you may trim the loose edges. Do not remove adhesive strips completely unless your health care provider tells you to do that.  Check your insertion site every day for signs of infection. Check for: ? Redness, swelling, or pain. ? Fluid or blood. ? Pus or a bad smell. ? Warmth.  Do not take baths, swim, or use a hot tub until your health care provider approves.  You may shower 24-48 hours after the procedure, or as directed by your health care provider. ? Remove the dressing and gently wash the site with plain soap and water. ? Pat the area dry with a clean towel. ? Do not rub the site. That could cause bleeding.  Do not apply powder or lotion to the site. Activity   For 24 hours after the procedure, or as directed by your health care provider: ? Do not flex or bend the affected arm. ? Do not push or pull heavy objects with the affected arm. ? Do not  drive yourself home from the hospital or clinic. You may drive 24 hours after the procedure unless your health care provider tells you not to. ? Do not operate machinery or power tools.  Do not lift anything that is heavier than 10 lb (4.5 kg), or the limit that you are told, until your health care provider says that it is safe.  Ask your health care provider when it is okay to: ? Return to work or school. ? Resume usual physical activities or sports. ? Resume sexual activity. General instructions  If the catheter site starts to bleed, raise your arm and put firm pressure on the site. If the bleeding does not stop, get help right away. This is a medical emergency.  If you went home on the same day as your procedure, a responsible adult should be with you for the first 24 hours after you arrive home.  Keep all follow-up visits as told by your health care provider. This is important. Contact a health care provider if:  You have a fever.  You have redness, swelling, or yellow drainage around your insertion site. Get help right away if:  You have unusual pain at the radial site.  The catheter insertion area swells very fast.  The insertion area is bleeding, and the bleeding does not stop when you hold steady pressure on the area.  Your arm or hand becomes pale, cool, tingly, or numb. These symptoms may represent a serious problem   that is an emergency. Do not wait to see if the symptoms will go away. Get medical help right away. Call your local emergency services (911 in the [place] Do not drive yourself to the hospital. Summary  After the procedure, it is common to have bruising and tenderness at the site.  Follow instructions from your health care provider about how to take care of your radial site wound. Check the wound every day for signs of infection.  Do not lift anything that is heavier than 10 lb (4.5 kg), or the limit that you are told, until your health care provider says  that it is safe. This information is not intended to replace advice given to you by your health care provider. Make sure you discuss any questions you have with your health care provider. Document Revised: 06/24/2017 Document Reviewed: 06/24/2017 Elsevier Patient Education  2020 Elsevier Inc. Femoral Site Care This sheet gives you information about how to care for yourself after your procedure. Your health care provider may also give you more specific instructions. If you have problems or questions, contact your health care provider. What can I expect after the procedure? After the procedure, it is common to have:  Bruising that usually fades within 1-2 weeks.  Tenderness at the site. Follow these instructions at home: Wound care  Follow instructions from your health care provider about how to take care of your insertion site. Make sure you: ? Wash your hands with soap and water before you change your bandage (dressing). If soap and water are not available, use hand sanitizer. ? Change your dressing as told by your health care provider. ? Leave stitches (sutures), skin glue, or adhesive strips in place. These skin closures may need to stay in place for 2 weeks or longer. If adhesive strip edges start to loosen and curl up, you may trim the loose edges. Do not remove adhesive strips completely unless your health care provider tells you to do that.  Do not take baths, swim, or use a hot tub until your health care provider approves.  You may shower 24-48 hours after the procedure or as told by your health care provider. ? Gently wash the site with plain soap and water. ? Pat the area dry with a clean towel. ? Do not rub the site. This may cause bleeding.  Do not apply powder or lotion to the site. Keep the site clean and dry.  Check your femoral site every day for signs of infection. Check for: ? Redness, swelling, or pain. ? Fluid or blood. ? Warmth. ? Pus or a bad  smell. Activity  For the first 2-3 days after your procedure, or as long as directed: ? Avoid climbing stairs as much as possible. ? Do not squat.  Do not lift anything that is heavier than 10 lb (4.5 kg), or the limit that you are told, until your health care provider says that it is safe.  Rest as directed. ? Avoid sitting for a long time without moving. Get up to take short walks every 1-2 hours.  Do not drive for 24 hours if you were given a medicine to help you relax (sedative). General instructions  Take over-the-counter and prescription medicines only as told by your health care provider.  Keep all follow-up visits as told by your health care provider. This is important. Contact a health care provider if you have:  A fever or chills.  You have redness, swelling, or pain around your insertion site.   Get help right away if:  The catheter insertion area swells very fast.  You pass out.  You suddenly start to sweat or your skin gets clammy.  The catheter insertion area is bleeding, and the bleeding does not stop when you hold steady pressure on the area.  The area near or just beyond the catheter insertion site becomes pale, cool, tingly, or numb. These symptoms may represent a serious problem that is an emergency. Do not wait to see if the symptoms will go away. Get medical help right away. Call your local emergency services (911 in the [place] Do not drive yourself to the hospital. Summary  After the procedure, it is common to have bruising that usually fades within 1-2 weeks.  Check your femoral site every day for signs of infection.  Do not lift anything that is heavier than 10 lb (4.5 kg), or the limit that you are told, until your health care provider says that it is safe. This information is not intended to replace advice given to you by your health care provider. Make sure you discuss any questions you have with your health care provider. Document Revised:  06/01/2017 Document Reviewed: 06/01/2017 Elsevier Patient Education  2020 Elsevier Inc.  

## 2019-08-04 NOTE — Progress Notes (Signed)
Up and walked and tolerated well; right groin stable no bleeding or hematoma  right radial stable no bleeding or hematoma

## 2019-08-04 NOTE — H&P (Addendum)
Darryl [name] is an 69 y.o. male.   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   Patient was seen by my partner Dr. Odis Hollingshead on 08/03/2019 for outpatient consultation. While he has had stable exertional pain for past two years, he has had significant increase in the past week or so.   At baseline, he is quite active.  However, he has had significant reduction in his physical activity due to his chest pain.    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  He underwent vertebral artery coiling.  Subsequent cerebral angiogram showed occluded occluded right vertebral artery, that was previously followed.  All other AV malformations or aneurysms noted.  Patient has history of seizures, likely secondary to subarachnoid hemorrhage.  He is currently on Lamictal 125 mg twice daily.   Past Medical History:  Diagnosis Date  . Acute deep vein thrombosis (DVT) of left femoral vein (HCC) 07/01/2015  . Anxiety   . Diabetes mellitus without complication (HCC)   . Epilepsy (HCC)   . Erectile dysfunction   . Hyperlipidemia   . Hypertension   . Seizures (HCC)     Past Surgical History:  Procedure Laterality Date  . CHOLECYSTECTOMY    . IR GENERIC HISTORICAL  05/16/2016   IR ANGIO VERTEBRAL SEL VERTEBRAL UNI L MOD SED 05/16/2016 [facility]  . IR GENERIC HISTORICAL  05/16/2016   IR ANGIOGRAM EXTREMITY RIGHT 05/16/2016 [facility]  . IR GENERIC HISTORICAL  05/16/2016   IR ANGIO INTRA EXTRACRAN SEL COM CAROTID INNOMINATE UNI R MOD SED 05/16/2016 [facility]  . KNEE SURGERY    . RADIOLOGY WITH ANESTHESIA N/A 06/11/2015   Procedure: RADIOLOGY WITH ANESTHESIA;  Surgeon: Lisbeth Renshaw, MD;  Location: [facility] OR;  Service: Radiology;  Laterality: N/A;    Family History  Problem  Relation Age of Onset  . Alzheimer's disease Mother   . Diabetes Father   . Aneurysm Brother   . Aneurysm Sister    Social History:  reports that he quit smoking about 31 years ago. His smoking use included cigarettes. He has a 30.00 pack-year smoking history. He has never used smokeless tobacco. He reports that he does not drink alcohol or use drugs.  Allergies: No Known Allergies  ROS   Blood pressure 124/71, pulse (!) 57, temperature 97.9 F (36.6 C), temperature source Oral, resp. rate 16, SpO2 99 %. There is no height or weight on file to calculate BMI.  Physical Exam  Results for orders placed or performed during the hospital encounter of 08/03/19 (from the past 48 hour(s))  Basic metabolic panel     Status: Abnormal   Collection Time: 08/03/19  7:42 PM  Result Value Ref Range   Sodium 142 135 - 145 mmol/L   Potassium 3.7 3.5 - 5.1 mmol/L   Chloride 104 98 - 111 mmol/L   CO2 27 22 - 32 mmol/L   Glucose, Bld 126 (H) 70 - 99 mg/dL    Comment: Glucose reference range applies only to samples taken after fasting for at least 8 hours.   BUN 15 8 - 23 mg/dL   Creatinine, Ser 7.79 0.61 - 1.24 mg/dL   Calcium 9.8 8.9 - 39.0 mg/dL   GFR calc non Af Amer >60 >60 mL/min   GFR calc Af  Amer >60 >60 mL/min   Anion gap 11 5 - 15    Comment: Performed at [facility], [address], [place], [place] [address]  CBC     Status: Abnormal   Collection Time: 08/03/19  7:42 PM  Result Value Ref Range   WBC 6.3 4.0 - 10.5 K/uL   RBC 5.46 4.22 - 5.81 MIL/uL   Hemoglobin 14.1 13.0 - 17.0 g/dL   HCT 12.2 48.2 - 50.0 %   MCV 79.5 (L) 80.0 - 100.0 fL   MCH 25.8 (L) 26.0 - 34.0 pg   MCHC 32.5 30.0 - 36.0 g/dL   RDW 37.0 48.8 - 89.1 %   Platelets 217 150 - 400 K/uL   nRBC 0.0 0.0 - 0.2 %    Comment: Performed at [facility], [address], [place], [place] [address]  Troponin I (High Sensitivity)     Status: None   Collection Time: 08/03/19  7:42 PM  Result Value Ref Range    Troponin I (High Sensitivity) 6 <18 ng/L    Comment: (NOTE) Elevated high sensitivity troponin I (hsTnI) values and significant  changes across serial measurements may suggest ACS but many other  chronic and acute conditions are known to elevate hsTnI results.  Refer to the "Links" section for chest pain algorithms and additional  guidance. Performed at [facility], [address] [place], [place] [address]   Troponin I (High Sensitivity)     Status: None   Collection Time: 08/03/19 10:07 PM  Result Value Ref Range   Troponin I (High Sensitivity) 4 <18 ng/L    Comment: (NOTE) Elevated high sensitivity troponin I (hsTnI) values and significant  changes across serial measurements may suggest ACS but many other  chronic and acute conditions are known to elevate hsTnI results.  Refer to the "Links" section for chest pain algorithms and additional  guidance. Performed at [facility], [address] [place], [place] [address]   Respiratory Panel by RT PCR (Flu A&B, Covid) - Nasopharyngeal Swab     Status: None   Collection Time: 08/04/19  4:18 AM   Specimen: Nasopharyngeal Swab  Result Value Ref Range   SARS Coronavirus 2 by RT PCR NEGATIVE NEGATIVE    Comment: (NOTE) SARS-CoV-2 target nucleic acids are NOT DETECTED. The SARS-CoV-2 RNA is generally detectable in upper respiratoy specimens during the acute phase of infection. The lowest concentration of SARS-CoV-2 viral copies this assay can detect is 131 copies/mL. A negative result does not preclude SARS-Cov-2 infection and should not be used as the sole basis for treatment or other patient management decisions. A negative result may occur with  improper specimen collection/handling, submission of specimen other than nasopharyngeal swab, presence of viral mutation(s) within the areas targeted by this assay, and inadequate number of viral copies (<131 copies/mL). A negative result must be combined with  clinical observations, patient history, and epidemiological information. The expected result is Negative. Fact Sheet for Patients:  https://www.moore.com/ Fact Sheet for Healthcare Providers:  https://www.young.biz/ This test is not yet ap proved or cleared by the [place] FDA and  has been authorized for detection and/or diagnosis of SARS-CoV-2 by FDA under an Emergency Use Authorization (EUA). This EUA will remain  in effect (meaning this test can be used) for the duration of the COVID-19 declaration under Section 564(b)(1) of the Act, 21 U.S.C. section 360bbb-3(b)(1), unless the authorization is terminated or revoked sooner.    Influenza A  by PCR NEGATIVE NEGATIVE   Influenza B by PCR NEGATIVE NEGATIVE    Comment: (NOTE) The Xpert Xpress SARS-CoV-2/FLU/RSV assay is intended as an aid in  the diagnosis of influenza from Nasopharyngeal swab specimens and  should not be used as a sole basis for treatment. Nasal washings and  aspirates are unacceptable for Xpert Xpress SARS-CoV-2/FLU/RSV  testing. Fact Sheet for Patients: https://www.moore.com/ Fact Sheet for Healthcare Providers: https://www.young.biz/ This test is not yet approved or cleared by the [place] FDA and  has been authorized for detection and/or diagnosis of SARS-CoV-2 by  FDA under an Emergency Use Authorization (EUA). This EUA will remain  in effect (meaning this test can be used) for the duration of the  Covid-19 declaration under Section 564(b)(1) of the Act, 21  U.S.C. section 360bbb-3(b)(1), unless the authorization is  terminated or revoked. Performed at [facility], [address], [place], [place] [address]     Labs:   Lab Results  Component Value Date   WBC 6.3 08/03/2019   HGB 14.1 08/03/2019   HCT 43.4 08/03/2019   MCV 79.5 (L) 08/03/2019   PLT 217 08/03/2019    Recent Labs  Lab 08/03/19 1942  NA 142   K 3.7  CL 104  CO2 27  BUN 15  CREATININE 1.12  CALCIUM 9.8  GLUCOSE 126*    Lipid Panel     Component Value Date/Time   CHOL  10/02/2009 0615    166        ATP III CLASSIFICATION:  <200     mg/dL   Desirable  119-147  mg/dL   Borderline High  >=829    mg/dL   High          TRIG 80 06/12/2015 0724   HDL 62 10/02/2009 0615   CHOLHDL 2.7 10/02/2009 0615   VLDL 7 10/02/2009 0615   LDLCALC  10/02/2009 0615    97        Total Cholesterol/HDL:CHD Risk Coronary Heart Disease Risk Table                     Men   Women  1/2 Average Risk   3.4   3.3  Average Risk       5.0   4.4  2 X Average Risk   9.6   7.1  3 X Average Risk  23.4   11.0        Use the calculated Patient Ratio above and the CHD Risk Table to determine the patient's CHD Risk.        ATP III CLASSIFICATION (LDL):  <100     mg/dL   Optimal  562-130  mg/dL   Near or Above                    Optimal  130-159  mg/dL   Borderline  865-784  mg/dL   High  >696     mg/dL   Very High    BNP (last 3 results) No results for input(s): BNP in the last 8760 hours.  HEMOGLOBIN A1C No results found for: HGBA1C, MPG  Cardiac Panel (last 3 results) No results for input(s): CKTOTAL, CKMB, RELINDX in the last 8760 hours.  Invalid input(s): TROPONINHS  Lab Results  Component Value Date   CKTOTAL 155 10/02/2009   CKMB 0.5 10/02/2009     TSH No results for input(s): TSH in the last 8760 hours.   (Not in a hospital  admission)     Current Facility-Administered Medications:  .  0.9 %  sodium chloride infusion, 250 mL, Intravenous, PRN, Cristian Davitt J, MD .  Melene Muller ON 08/05/2019] 0.9% sodium chloride infusion, 3 mL/kg/hr, Intravenous, Continuous **FOLLOWED BY** [START ON 08/05/2019] 0.9% sodium chloride infusion, 1 mL/kg/hr, Intravenous, Continuous, Caedyn Tassinari J, MD, Last Rate: 75.3 mL/hr at 08/04/19 0455, 1 mL/kg/hr at 08/04/19 0455 .  acetaminophen (TYLENOL) tablet 650 mg, 650 mg, Oral, Q4H PRN,  Ren Grasse J, MD .  Melene Muller ON 08/05/2019] aspirin chewable tablet 81 mg, 81 mg, Oral, Pre-Cath, Jamespaul Secrist J, MD .  Melene Muller ON 08/05/2019] aspirin EC tablet 81 mg, 81 mg, Oral, Daily, Koleson Reifsteck J, MD .  heparin ADULT infusion 100 units/mL (25000 units/23mL sodium chloride 0.45%), 1,050 Units/hr, Intravenous, Continuous, Titus Mould, RPH, Last Rate: 10.5 mL/hr at 08/04/19 0454, 1,050 Units/hr at 08/04/19 0454 .  ondansetron (ZOFRAN) injection 4 mg, 4 mg, Intravenous, Q6H PRN, Kazuki Ingle J, MD .  sodium chloride flush (NS) 0.9 % injection 3 mL, 3 mL, Intravenous, Q12H, Hassel Uphoff J, MD .  sodium chloride flush (NS) 0.9 % injection 3 mL, 3 mL, Intravenous, PRN, Abhi Moccia J, MD  Current Outpatient Medications:  .  atorvastatin (LIPITOR) 20 MG tablet, Take 1 tablet (20 mg total) by mouth at bedtime., Disp: 90 tablet, Rfl: 3 .  FeFum-FePoly-FA-B Cmp-C-Biot (FOLIVANE-PLUS) CAPS, Take 1 capsule by mouth 2 (two) times daily., Disp: , Rfl:  .  KLOR-CON M20 20 MEQ tablet, TAKE 1 TABLET BY MOUTH AT BEDTIME (Patient taking differently: Take 20 mEq by mouth at bedtime. ), Disp: 30 tablet, Rfl: 2 .  lamoTRIgine (LAMICTAL) 100 MG tablet, Take 100 mg by mouth 2 (two) times daily. Take with Lamotrigine 25 mg to equal 125 mg, Disp: , Rfl:  .  lamoTRIgine (LAMICTAL) 25 MG tablet, Take 25 mg by mouth 2 (two) times daily. Take with lamotrigine 100 mg to equal 125 mg, Disp: , Rfl:  .  nitroGLYCERIN (NITROSTAT) 0.4 MG SL tablet, Place 1 tablet (0.4 mg total) under the tongue every 5 (five) minutes as needed for chest pain. If you require more than two tablets 5 mins apart call 911., Disp: 90 tablet, Rfl: 0 .  tadalafil (CIALIS) 5 MG tablet, Take 5 mg by mouth daily., Disp: , Rfl:  .  telmisartan-hydrochlorothiazide (MICARDIS HCT) 40-12.5 MG tablet, Take 1 tablet by mouth daily., Disp: , Rfl:  .  Vitamin D, Ergocalciferol, (DRISDOL) 50000 UNITS CAPS capsule, Take 50,000 Units  by mouth every Sunday. , Disp: , Rfl: 5 .  aspirin EC 81 MG tablet, Take 1 tablet (81 mg total) by mouth daily., Disp: 90 tablet, Rfl: 3   Today's Vitals   08/04/19 0515 08/04/19 0530 08/04/19 0545 08/04/19 0600  BP: 138/75 126/68 117/65 124/71  Pulse: (!) 56 (!) 58 60 (!) 57  Resp: 19 19 14 16   Temp:      TempSrc:      SpO2: 100% 99% 99% 99%  PainSc:       There is no height or weight on file to calculate BMI.   CARDIAC STUDIES:  EKG 08/04/2019: Sinus rhythm 57 bpm.  Normal EKG.  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   Chest pain: Longstanding history of exertional chest pain with acute worsening over  the past 1-2 weeks, s/o accelerated/unstable angina. Patient has resting bradycardia-thus unable to use BB. He has had recent use of Cialis, so unable to use NTG.   He is normotensive. EKG and trop here show no e/o MI. Recommend proceeding with coronary angiogram with possible intervention.  Schedule for cardiac catheterization, and possible angioplasty. We discussed regarding risks, benefits, alternatives to this including stress testing, CTA and continued medical therapy. Patient wants to proceed. Understands <1-2% risk of death, stroke, MI, urgent CABG, bleeding, infection, renal failure but not limited to these.  Continue aspirin, statin, heparin.  Keep n.p.o. If he requires dual antiplatelet therapy, will use Plavix given history of subarachnoid hemorrhage.  Nigel Mormon, MD 08/04/2019, 7:28 AM [facility] Pager: [phone] Office: [phone] If no answer: [phone]

## 2019-08-04 NOTE — Interval H&P Note (Signed)
History and Physical Interval Note:  08/04/2019 9:42 AM  Darryl [name]  has presented today for surgery, with the diagnosis of unstable angina.  The various methods of treatment have been discussed with the patient and family. After consideration of risks, benefits and other options for treatment, the patient has consented to  Procedure(s): LEFT HEART CATH AND CORONARY ANGIOGRAPHY (N/A) as a surgical intervention.  The patient's history has been reviewed, patient examined, no change in status, stable for surgery.  I have reviewed the patient's chart and labs.  Questions were answered to the patient's satisfaction.    2016 Appropriate Use Criteria for Coronary Revascularization in Patients With Acute Coronary Syndrome NSTEMI/UA Intermediate Risk (TIMI Score 3-4) NSTEMI/Unstable angina, stabilized patient at Intermediate Risk (TIMI Score 3-4) Link Here: [employer].es Indication:  Revascularization by PCI or CABG of 1 or more arteries in a patient with NSTEMI or unstable angina with Stabilization after presentation Intermediate risk for clinical events  A (7) Indication: 16; Score 7    Darryl [name] J Lara Palinkas

## 2019-08-08 ENCOUNTER — Telehealth: Payer: Self-pay | Admitting: *Deleted

## 2019-08-08 NOTE — Telephone Encounter (Signed)
Follow Up:      Cordelia Pen from the Dr Tedra [place] office wants to know how can she get the results from pt's monitor, she have 7 other she needs also.?

## 2019-08-09 ENCOUNTER — Ambulatory Visit: Payer: Medicare PPO | Admitting: Orthopaedic Surgery

## 2019-08-09 ENCOUNTER — Other Ambulatory Visit: Payer: Self-pay

## 2019-08-09 ENCOUNTER — Encounter: Payer: Self-pay | Admitting: Orthopaedic Surgery

## 2019-08-09 ENCOUNTER — Ambulatory Visit (INDEPENDENT_AMBULATORY_CARE_PROVIDER_SITE_OTHER): Payer: Medicare PPO

## 2019-08-09 ENCOUNTER — Ambulatory Visit: Payer: Medicare Other | Admitting: Orthopaedic Surgery

## 2019-08-09 VITALS — BP 132/77 | HR 54 | Ht 70.0 in | Wt 166.0 lb

## 2019-08-09 DIAGNOSIS — M542 Cervicalgia: Secondary | ICD-10-CM | POA: Diagnosis not present

## 2019-08-09 DIAGNOSIS — M4722 Other spondylosis with radiculopathy, cervical region: Secondary | ICD-10-CM

## 2019-08-09 NOTE — Telephone Encounter (Signed)
Attempted to call Dr. Lora Havens office back. Left on hold. Results from Mr. Baeten cardiac event monitor are not yet available for importing from [employer].  Message sent to [employer] asking to post results asap.

## 2019-08-09 NOTE — Progress Notes (Signed)
Office Visit Note   Patient: Darryl [name]           Date of Birth: [birth year]           MRN: [id number] Visit Date: 08/09/2019              Requested by: Lucianne Lei, MD [address] [place],  [place] [address] PCP: Lucianne Lei, MD   Assessment & Plan: Visit Diagnoses:  1. Neck pain   2. Other spondylosis with radiculopathy, cervical region     Plan: Patient is taken anti-inflammatories been through physical therapy with persistent neck pain that radiates into his upper chest. He has spondylitic changes at C3-4, C4-5 and C5-6. We will obtain a cervical CT scan and see pt back after study.   Follow-Up Instructions: Return after CT cervical spine.   Orders:  Orders Placed This Encounter  Procedures  . XR Cervical Spine 2 or 3 views  . CT CERVICAL SPINE WO CONTRAST   No orders of the defined types were placed in this encounter.     Procedures: No procedures performed   Clinical Data: No additional findings.   Subjective: Chief Complaint  Patient presents with  . Left Shoulder - Follow-up  . Right Thumb - Follow-up    HPI 69 year old male seen on follow-up with continued left shoulder pain and right trigger thumb. He has been through physical therapy for her shoulder and states he had cardiac catheterization on 08/04/2019 with chest pain. Patient had no significant coronary artery disease. Patient states he had missed some of appointments for therapy for his shoulder due to his admission. Patient had normal ejection fracture and no significant areas of stenosis. Post splinting patient states he is not having active triggering in his right thumb.  Review of Systems updated unchanged from 07/20/2018 office visit other than mentioned above.   Objective: Vital Signs: BP 132/77   Pulse (!) 54   Ht 5\' 10"  (1.778 m)   Wt 166 lb (75.3 kg)   BMI 23.82 kg/m   Physical Exam Constitutional:      Appearance: He is well-developed.  HENT:     Head: Normocephalic and  atraumatic.  Eyes:     Pupils: Pupils are equal, round, and reactive to light.  Neck:     Thyroid: No thyromegaly.     Trachea: No tracheal deviation.  Cardiovascular:     Rate and Rhythm: Normal rate.  Pulmonary:     Effort: Pulmonary effort is normal.     Breath sounds: No wheezing.  Abdominal:     General: Bowel sounds are normal.     Palpations: Abdomen is soft.  Skin:    General: Skin is warm and dry.     Capillary Refill: Capillary refill takes less than 2 seconds.  Neurological:     Mental Status: He is alert and oriented to person, place, and time.  Psychiatric:        Behavior: Behavior normal.        Thought Content: Thought content normal.        Judgment: Judgment normal.     Ortho Exam mild tenderness over the A1 pulley right thumb. No active triggering. Patient has some increased pain with brachial plexus compression right and left. Upper extremity reflexes are 2+ and symmetrical.  Specialty Comments:  No specialty comments available.  Imaging: No results found.   PMFS History: Patient Active Problem List   Diagnosis Date Noted  . Other spondylosis with  radiculopathy, cervical region 08/15/2019  . Unstable angina (HCC) 08/04/2019  . Chest pain 08/04/2019  . Trigger thumb, right thumb 06/08/2019  . Impingement syndrome of left shoulder 06/08/2019  . Chronic left shoulder pain 07/20/2018  . Subungual hematoma of foot, left, initial encounter 07/20/2018  . 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 Falls   . Acute deep vein thrombosis (DVT) of left femoral vein (HCC) 07/01/2015  . Pain   . Poor fluid intake   . Essential hypertension   . Type 2 diabetes mellitus with complication, without long-term current use of insulin (HCC)   . Disorientation   . Convulsions (HCC)   . Acute blood loss anemia   . Hypokalemia   . Ileus (HCC)   . Leukocytosis   . Right knee pain   . Cognitive safety issue   .  Impulsiveness   . PVC (premature ventricular contraction)   . Abdominal discomfort   . SAH (subarachnoid hemorrhage) (HCC) 06/11/2015  . Emesis   . Subarachnoid hemorrhage (HCC) 06/10/2015  . Diabetes mellitus with coincident hypertension (HCC)   . Diabetes mellitus without complication [facility])    Past Medical History:  Diagnosis Date  . Acute deep vein thrombosis (DVT) of left femoral vein (HCC) 07/01/2015  . Anxiety   . Diabetes mellitus without complication (HCC)   . Epilepsy (HCC)   . Erectile dysfunction   . Hyperlipidemia   . Hypertension   . Seizures (HCC)     Family History  Problem Relation Age of Onset  . Alzheimer's disease Mother   . Diabetes Father   . Aneurysm Brother   . Aneurysm Sister     Past Surgical History:  Procedure Laterality Date  . CHOLECYSTECTOMY    . IR GENERIC HISTORICAL  05/16/2016   IR ANGIO VERTEBRAL SEL VERTEBRAL UNI L MOD SED 05/16/2016 [facility]  . IR GENERIC HISTORICAL  05/16/2016   IR ANGIOGRAM EXTREMITY RIGHT 05/16/2016 [facility]  . IR GENERIC HISTORICAL  05/16/2016   IR ANGIO INTRA EXTRACRAN SEL COM CAROTID INNOMINATE UNI R MOD SED 05/16/2016 [facility]  . KNEE SURGERY    . LEFT HEART CATH AND CORONARY ANGIOGRAPHY N/A 08/04/2019   Procedure: LEFT HEART CATH AND CORONARY ANGIOGRAPHY;  Surgeon: Elder Negus, MD;  [facility];  Service: Cardiovascular;  Laterality: N/A;  . RADIOLOGY WITH ANESTHESIA N/A 06/11/2015   Procedure: RADIOLOGY WITH ANESTHESIA;  Surgeon: Lisbeth Renshaw, MD;  Location: [facility] OR;  Service: Radiology;  Laterality: N/A;   Social History   Occupational History  . Not on file  Tobacco Use  . Smoking status: Former Smoker    Packs/day: 2.00    Years: 15.00    Pack years: 30.00    Types: Cigarettes    Quit date: 06/02/1988    Years since quitting: 31.2  . Smokeless tobacco: Never Used  Substance and Sexual Activity  . Alcohol use: No  . Drug use: No  . Sexual activity: Not on file

## 2019-08-10 ENCOUNTER — Telehealth: Payer: Self-pay | Admitting: *Deleted

## 2019-08-10 NOTE — Telephone Encounter (Signed)
Sherri from Dr. Parke Simmers is calling to f/u on the patient's cardiac event monitor results due to not still not receiving them. She states she has been waiting on a call since Monday and their office has 8 different patient's waiting on their results. Please advise.

## 2019-08-11 NOTE — Telephone Encounter (Signed)
Dr. Lora Havens office called twice regarding call. First call, office already closed.  Second call, left on hold approximately 10 minutes.  Darryl [name] test results have been imported and have been assigned to Dr. Ladona Ridgel to review.  Imported test can be viewed under CV Proc. In Epic.

## 2019-08-15 DIAGNOSIS — M4722 Other spondylosis with radiculopathy, cervical region: Secondary | ICD-10-CM | POA: Insufficient documentation

## 2019-08-18 ENCOUNTER — Ambulatory Visit: Payer: Medicare PPO | Admitting: Cardiology

## 2019-08-18 ENCOUNTER — Encounter: Payer: Self-pay | Admitting: Cardiology

## 2019-08-18 ENCOUNTER — Other Ambulatory Visit: Payer: Self-pay

## 2019-08-18 VITALS — BP 124/82 | HR 56 | Temp 98.7°F | Resp 10 | Ht 70.0 in | Wt 166.0 lb

## 2019-08-18 DIAGNOSIS — E782 Mixed hyperlipidemia: Secondary | ICD-10-CM

## 2019-08-18 DIAGNOSIS — E119 Type 2 diabetes mellitus without complications: Secondary | ICD-10-CM | POA: Diagnosis not present

## 2019-08-18 DIAGNOSIS — I10 Essential (primary) hypertension: Secondary | ICD-10-CM | POA: Diagnosis not present

## 2019-08-18 DIAGNOSIS — R001 Bradycardia, unspecified: Secondary | ICD-10-CM

## 2019-08-18 DIAGNOSIS — R42 Dizziness and giddiness: Secondary | ICD-10-CM

## 2019-08-18 DIAGNOSIS — I493 Ventricular premature depolarization: Secondary | ICD-10-CM

## 2019-08-18 DIAGNOSIS — Z87891 Personal history of nicotine dependence: Secondary | ICD-10-CM | POA: Diagnosis not present

## 2019-08-18 DIAGNOSIS — I209 Angina pectoris, unspecified: Secondary | ICD-10-CM

## 2019-08-18 NOTE — Progress Notes (Signed)
Chief Complaint  Patient presents with  . Results    after heart cath.     REQUESTING PHYSICIAN:  Renaye RakersBland, Veita, MD [address] [place] [id number]  HPI  Darryl [name] is a 69 y.o. male who presents to the office with a chief complaint of " post heart cath." Patient's past medical history and cardiac risk factors include: Diet-controlled diabetes mellitus type 2, hyperlipidemia, hypertension, advanced age.   Chest pain: Since last office visit patient states that his chest pain has improved.  Patient still has substernal chest pain which he describes as pressure-like sensation, intensity is 5 out of 10, worse with activity, improves with resting and relaxing.  Patient does not use of sublingual nitroglycerin tablets since last office visit.  Similar presentation during his prior visit was suggestive of unstable angina in the setting of underlying diabetes, hyperlipidemia and hypertension advanced age.  Patient went to the hospital for further evaluation.  He subsequently underwent left heart catheterization was found not to have obstructive CAD.  Results of the left heart catheterization were reviewed with the patient at today's office visit.  And they are noted below for further reference.  He was later discharged home.  Low heart rate: Patient states that he was diagnosed with bradycardia back in 2016.  Prior to seeing me in the office he had a monitor with his primary care provider.  The report is now available on EMR for review.  He is not on AV nodal blocking agents.  He is currently not taking any eyedrops.  His last TSH from July 2020 was within normal limits.  However, he does have underlying symptoms of lightheaded and dizziness which he states are chronic and stable and managed by PCP.   Review of systems positive for: Chest pain (as described above).  Lightheaded and dizziness (chronic and stable). Currently patient denies palpitations, orthopnea, paroxysmal nocturnal  dyspnea, lower extremity swelling, near syncope, syncopal events, hematochezia, hemoptysis, hematemesis, melanotic stools, no symptoms of amaurosis fugax, motor or sensory symptoms or dysphasia in the last 6 months.   ALLERGIES: No Known Allergies   MEDICATION LIST PRIOR TO VISIT: Current Outpatient Medications on File Prior to Visit  Medication Sig Dispense Refill  . aspirin EC 81 MG tablet Take 81 mg by mouth daily.    Marland Kitchen. atorvastatin (LIPITOR) 20 MG tablet Take 1 tablet (20 mg total) by mouth at bedtime. 90 tablet 3  . FeFum-FePoly-FA-B Cmp-C-Biot (FOLIVANE-PLUS) CAPS Take 1 capsule by mouth 2 (two) times daily.    Marland Kitchen. KLOR-CON M20 20 MEQ tablet TAKE 1 TABLET BY MOUTH AT BEDTIME (Patient taking differently: Take 20 mEq by mouth at bedtime. ) 30 tablet 2  . lamoTRIgine (LAMICTAL) 100 MG tablet Take 100 mg by mouth 2 (two) times daily. Take with Lamotrigine 25 mg to equal 125 mg    . lamoTRIgine (LAMICTAL) 25 MG tablet Take 25 mg by mouth 2 (two) times daily. Take with lamotrigine 100 mg to equal 125 mg    . nitroGLYCERIN (NITROSTAT) 0.4 MG SL tablet Place 1 tablet (0.4 mg total) under the tongue every 5 (five) minutes as needed for chest pain. If you require more than two tablets 5 mins apart call 911. 90 tablet 0  . telmisartan-hydrochlorothiazide (MICARDIS HCT) 40-12.5 MG tablet Take 1 tablet by mouth daily.    . Vitamin D, Ergocalciferol, (DRISDOL) 50000 UNITS CAPS capsule Take 50,000 Units by mouth every Sunday.   5   No current  facility-administered medications on file prior to visit.    PAST MEDICAL HISTORY: Past Medical History:  Diagnosis Date  . Acute deep vein thrombosis (DVT) of left femoral vein (HCC) 07/01/2015  . Anxiety   . Diabetes mellitus without complication (HCC)   . Epilepsy (HCC)   . Erectile dysfunction   . Hyperlipidemia   . Hypertension   . Seizures (HCC)     PAST SURGICAL HISTORY: Past Surgical History:  Procedure Laterality Date  . CHOLECYSTECTOMY    .  IR GENERIC HISTORICAL  05/16/2016   IR ANGIO VERTEBRAL SEL VERTEBRAL UNI L MOD SED 05/16/2016 [facility]  . IR GENERIC HISTORICAL  05/16/2016   IR ANGIOGRAM EXTREMITY RIGHT 05/16/2016 [facility]  . IR GENERIC HISTORICAL  05/16/2016   IR ANGIO INTRA EXTRACRAN SEL COM CAROTID INNOMINATE UNI R MOD SED 05/16/2016 [facility]  . KNEE SURGERY    . LEFT HEART CATH AND CORONARY ANGIOGRAPHY N/A 08/04/2019   Procedure: LEFT HEART CATH AND CORONARY ANGIOGRAPHY;  Surgeon: Elder Negus, MD;  [facility];  Service: Cardiovascular;  Laterality: N/A;  . RADIOLOGY WITH ANESTHESIA N/A 06/11/2015   Procedure: RADIOLOGY WITH ANESTHESIA;  Surgeon: Lisbeth Renshaw, MD;  Location: [facility];  Service: Radiology;  Laterality: N/A;    FAMILY HISTORY: The patient family history includes Alzheimer's disease in his mother; Aneurysm in his brother and sister; Diabetes in his father.   SOCIAL HISTORY:  The patient  reports that he quit smoking about 31 years ago. His smoking use included cigarettes. He has a 30.00 pack-year smoking history. He has never used smokeless tobacco. He reports that he does not drink alcohol or use drugs.  14 ORGAN REVIEW OF SYSTEMS: CONSTITUTIONAL: No fever or significant weight loss EYES: No recent significant visual change EARS, NOSE, MOUTH, THROAT: No recent significant change in hearing CARDIOVASCULAR: See discussion in subjective/HPI RESPIRATORY: See discussion in subjective/HPI GASTROINTESTINAL: No recent complaints of abdominal pain GENITOURINARY: No recent significant change in genitourinary status MUSCULOSKELETAL: No recent significant change in musculoskeletal status INTEGUMENTARY: No recent rash NEUROLOGIC: No recent significant change in motor function PSYCHIATRIC: No recent significant change in mood ENDOCRINOLOGIC: No recent significant change in endocrine status HEMATOLOGIC/LYMPHATIC: No recent significant unexpected  bruising ALLERGIC/IMMUNOLOGIC: No recent unexplained allergic reaction  PHYSICAL EXAM: Vitals with BMI 08/18/2019 08/09/2019 08/04/2019  Height 5\' 10"  5\' 10"  -  Weight 166 lbs 166 lbs -  BMI 23.82 23.82 -  Systolic 124 132  Diastolic 82 77 70  Pulse 56 54 -   CONSTITUTIONAL: Well-developed and well-nourished. No acute distress.  SKIN: Skin is warm and dry. No rash noted. No cyanosis. No pallor. No jaundice HEAD: Normocephalic and atraumatic.  EYES: No scleral icterus MOUTH/THROAT: Moist oral membranes.  NECK: No JVD present. No thyromegaly noted. No carotid bruits  LYMPHATIC: No visible cervical adenopathy.  CHEST Normal respiratory effort. No intercostal retractions  LUNGS: Clear to auscultation bilaterally.  No stridor. No wheezes. No rales.  CARDIOVASCULAR: Bradycardia, regular, positive S1-S2, no murmurs rubs or gallops appreciated ABDOMINAL: Nonobese, soft, nontender, nondistended positive bowel sounds.  No apparent ascites.  EXTREMITIES: No peripheral edema  HEMATOLOGIC: No significant bruising NEUROLOGIC: Oriented to person, place, and time. Nonfocal. Normal muscle tone.  PSYCHIATRIC: Normal mood and affect. Normal behavior. Cooperative  CARDIAC DATABASE: EKG: 08/03/2019: Sinus bradycardia with ventricular rate of 54 bpm, poor R wave progression, without underlying new injury pattern.  Echocardiogram: 06/2016: - Normal LV size with mild LV hypertrophy. EF 55-60%. Normal  RV size and systolic function. No significant valvular abnormalities.  March 2021:LVEF 65 to 70%, no regional wall motion abnormalities, normal diastolic function, mildly dilated left atrium, mildly dilated right atrium, no significant valvular abnormality.   Stress Testing:  None  Heart Catheterization: 08/04/2019: Right dominant circulation with tortuous, small-medium caliber vessels. TIMI III flow. No angiographically significant coronary artery disease. Normal LVEDP. Normal LVEF.  MCT  06/27/2019-07/26/2019: Minimum heart rate was noted to be 46 bpm on June 30, 2019 at 1:37 PM. Maximum heart rate was noted to be 120 bpm on July 14, 2019 at 6:19 PM. Average heart rate 70 bpm. No atrial fibrillation detected during the monitoring period. Ventricular ectopic burden reported to be 16%. Patient triggered events illustrate underlying sinus mechanism and at times ventricular ectopy.  Please refer to the official report for more details.  LABORATORY DATA: CBC Latest Ref Rng & Units 08/03/2019 07/14/2017 05/16/2016  WBC 4.0 - 10.5 K/uL 6.3 - 5.8  Hemoglobin 13.0 - 17.0 g/dL 14.1 17.0 14.3  Hematocrit 39.0 - 52.0 % 43.4 50.0 42.6  Platelets 150 - 400 K/uL 217 - 244    CMP Latest Ref Rng & Units 08/03/2019 07/14/2017 05/16/2016  Glucose 70 - 99 mg/dL 126(H) 145(H) 142(H)  BUN 8 - 23 mg/dL 15 23(H) 14  Creatinine 0.61 - 1.24 mg/dL 1.12 1.40(H) 1.12  Sodium 135 - 145 mmol/L 142 140 143  Potassium 3.5 - 5.1 mmol/L 3.7 3.8 4.1  Chloride 98 - 111 mmol/L 104 101 107  CO2 22 - 32 mmol/L 27 - 29  Calcium 8.9 - 10.3 mg/dL 9.8 - 9.5  Total Protein 6.5 - 8.1 g/dL - - -  Total Bilirubin 0.3 - 1.2 mg/dL - - -  Alkaline Phos 38 - 126 U/L - - -  AST 15 - 41 U/L - - -  ALT 17 - 63 U/L - - -   External Labs: Collected: 06/13/2018 Lipid profile: Total cholesterol 220, triglycerides 69, HDL 72, LDL 132 Hemoglobin A1c: 6.5  12/31/2018 TSH: 1.63  07/2017 serum creatinine 1.4 mg/dL.  FINAL MEDICATION LIST END OF ENCOUNTER: No orders of the defined types were placed in this encounter.   Medications Discontinued During This Encounter  Medication Reason  . tadalafil (CIALIS) 5 MG tablet Patient has not taken in last 30 days  . tadalafil (CIALIS) 5 MG tablet Patient has not taken in last 30 days     Current Outpatient Medications:  .  aspirin EC 81 MG tablet, Take 81 mg by mouth daily., Disp: , Rfl:  .  atorvastatin (LIPITOR) 20 MG tablet, Take 1 tablet (20 mg total) by mouth at  bedtime., Disp: 90 tablet, Rfl: 3 .  FeFum-FePoly-FA-B Cmp-C-Biot (FOLIVANE-PLUS) CAPS, Take 1 capsule by mouth 2 (two) times daily., Disp: , Rfl:  .  KLOR-CON M20 20 MEQ tablet, TAKE 1 TABLET BY MOUTH AT BEDTIME (Patient taking differently: Take 20 mEq by mouth at bedtime. ), Disp: 30 tablet, Rfl: 2 .  lamoTRIgine (LAMICTAL) 100 MG tablet, Take 100 mg by mouth 2 (two) times daily. Take with Lamotrigine 25 mg to equal 125 mg, Disp: , Rfl:  .  lamoTRIgine (LAMICTAL) 25 MG tablet, Take 25 mg by mouth 2 (two) times daily. Take with lamotrigine 100 mg to equal 125 mg, Disp: , Rfl:  .  nitroGLYCERIN (NITROSTAT) 0.4 MG SL tablet, Place 1 tablet (0.4 mg total) under the tongue every 5 (five) minutes as needed for chest pain. If you require more than two tablets  5 mins apart call 911., Disp: 90 tablet, Rfl: 0 .  telmisartan-hydrochlorothiazide (MICARDIS HCT) 40-12.5 MG tablet, Take 1 tablet by mouth daily., Disp: , Rfl:  .  Vitamin D, Ergocalciferol, (DRISDOL) 50000 UNITS CAPS capsule, Take 50,000 Units by mouth every Sunday. , Disp: , Rfl: 5  IMPRESSION:    ICD-10-CM   1. Angina pectoris (HCC)  I20.9   2. PVC (premature ventricular contraction)  I49.3 Ambulatory referral to Cardiac Electrophysiology  3. Bradycardia  R00.1 Ambulatory referral to Cardiac Electrophysiology  4. Diabetes mellitus with coincident hypertension (HCC)  E11.9    I10   5. Essential hypertension  I10   6. Dizziness  R42   7. Former smoker  Z87.891   8. Mixed hyperlipidemia  E78.2   9. Dizziness and giddiness  R42      RECOMMENDATIONS: Darryl [name] is a 69 y.o. male whose past medical history and cardiac risk factors include: Diet-controlled diabetes mellitus type 2, hyperlipidemia, hypertension, advanced age.   Angina pectoris, stable:  Patient symptoms of chest discomfort are similar to prior office visit; however, the intensity is decreased.  Continue medical therapy.  Patient is encouraged to seek medical attention at  the closest ER via EMS if his symptoms were to increase in intensity, frequency, and/or duration or has symptoms of typical chest discomfort.  Patient verbalizes understanding.    Continue aspirin 81 mg p.o. daily   Continue statin therapy.  Continue ARB. Currently not on beta-blocker therapy due to bradycardia. Currently not started on long-acting nitrates as patient may plan to use Cialis for ED. Patient has sublingual nitroglycerin tablets to use for as needed basis. The proper use and anticipated side effects of nitroglycerine has been carefully explained.  If a single episode of chest pain is not relieved by one tablet, the patient will try another within 5 minutes; and if this doesn't relieve the pain, the patient is instructed to call 911 for transportation to an emergency department. I have counseled him that taking Viagra(sildenafil), Cialis (tadalafil), Levitra (vardenafil) with nitrates of any form can cause death. Patient understands that if he has used erectile dysfunction/BPH medications (in the last 72 hours) that have similar mechanism of action as mentioned above AND he has chest  discomfort he CANNOT use sublingual nitroglycerin tablets instead he should call 911 for additional medical attention.  Patient verbalized understanding. Echocardiogram results reviewed with the patient and noted above for the records. Has left heart catheterization films were reviewed, report reviewed, noted above or further reference.  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  Under the care of his primary care physician he underwent mobile cardiac ambulatory telemetry and the summary report is noted in epic for your review.  I reviewed the report findings noted above.  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  In the setting of preserved left ventricular systolic function, angiographically normal  coronary arteries, and continued symptoms would recommend EP evaluation for PVC burden/bradycardia.  Continue to monitor  Mixed hyperlipidemia:  Patient estimated 10-year risk of ASCVD is greater than 10%, started on Lipitor 20 mg p.o. nightly at the last office visit.  LDL currently not at goal.  Does not endorse myalgias.  Recommended 6 weeks lipid profile to reevaluate therapy.  He will have this addressed with his primary care physician.  Benign essential hypertension: . Office blood pressure is at goal.  . Medication reconciled.  . Patient is asked to  keep a log of both blood pressure and pulse so that medications can be titrated based on a blood pressure trend as opposed to isolated blood pressure readings in the office. . If the blood pressure is consistently greater than patient is asked to call the office to for medication titration sooner than the next office visit.  . Low salt diet recommended. A diet that is rich in fruits, vegetables, legumes, and low-fat dairy products and low in snacks, sweets, and meats (such as the Dietary Approaches to Stop Hypertension [DASH] diet).   Non-insulin-dependent diabetes mellitus type 2, diet controlled: Management per primary care team.  Orders Placed This Encounter  Procedures  . Ambulatory referral to Cardiac Electrophysiology   Evaluation and management ( ) with time spent reviewing the cath films, echo report, MCT report, performing exam, reviewing labs, greater than 50% of that time was spent in counseling and coordination care with the patient regarding complex decision making and discussion as state above, and documenting clinical evaluation.  --Continue cardiac medications as reconciled in final medication list. --Return in about 3 months (around 11/18/2019).. Or sooner if needed. --Continue follow-up with your primary care physician regarding the management of your other chronic comorbid conditions.  Patient's questions  and concerns were addressed to his satisfaction. He voices understanding of the instructions provided during this encounter.   This note was created using a voice recognition software as a result there may be grammatical errors inadvertently enclosed that do not reflect the nature of this encounter. Every attempt is made to correct such errors.  Tessa Lerner, DO, [facility] Office: [phone]

## 2019-08-22 DIAGNOSIS — E782 Mixed hyperlipidemia: Secondary | ICD-10-CM | POA: Diagnosis not present

## 2019-08-22 DIAGNOSIS — E785 Hyperlipidemia, unspecified: Secondary | ICD-10-CM | POA: Diagnosis not present

## 2019-08-22 DIAGNOSIS — R001 Bradycardia, unspecified: Secondary | ICD-10-CM | POA: Diagnosis not present

## 2019-08-22 DIAGNOSIS — R569 Unspecified convulsions: Secondary | ICD-10-CM | POA: Diagnosis not present

## 2019-08-22 DIAGNOSIS — I728 Aneurysm of other specified arteries: Secondary | ICD-10-CM | POA: Diagnosis not present

## 2019-08-22 DIAGNOSIS — I10 Essential (primary) hypertension: Secondary | ICD-10-CM | POA: Diagnosis not present

## 2019-08-22 DIAGNOSIS — R413 Other amnesia: Secondary | ICD-10-CM | POA: Diagnosis not present

## 2019-08-23 DIAGNOSIS — H2513 Age-related nuclear cataract, bilateral: Secondary | ICD-10-CM | POA: Diagnosis not present

## 2019-08-23 DIAGNOSIS — H43812 Vitreous degeneration, left eye: Secondary | ICD-10-CM | POA: Diagnosis not present

## 2019-08-23 DIAGNOSIS — H40013 Open angle with borderline findings, low risk, bilateral: Secondary | ICD-10-CM | POA: Diagnosis not present

## 2019-08-23 DIAGNOSIS — E119 Type 2 diabetes mellitus without complications: Secondary | ICD-10-CM | POA: Diagnosis not present

## 2019-08-25 ENCOUNTER — Other Ambulatory Visit: Payer: Medicare PPO

## 2019-08-30 ENCOUNTER — Ambulatory Visit: Payer: Medicare PPO | Admitting: Orthopaedic Surgery

## 2019-08-30 ENCOUNTER — Other Ambulatory Visit: Payer: Self-pay

## 2019-08-30 ENCOUNTER — Encounter: Payer: Self-pay | Admitting: Orthopaedic Surgery

## 2019-08-30 ENCOUNTER — Other Ambulatory Visit: Payer: Self-pay | Admitting: Cardiology

## 2019-08-30 VITALS — BP 114/77 | HR 60 | Ht 71.0 in | Wt 157.0 lb

## 2019-08-30 DIAGNOSIS — M65311 Trigger thumb, right thumb: Secondary | ICD-10-CM | POA: Diagnosis not present

## 2019-08-30 DIAGNOSIS — M7542 Impingement syndrome of left shoulder: Secondary | ICD-10-CM | POA: Diagnosis not present

## 2019-08-30 DIAGNOSIS — I209 Angina pectoris, unspecified: Secondary | ICD-10-CM

## 2019-08-30 DIAGNOSIS — M4722 Other spondylosis with radiculopathy, cervical region: Secondary | ICD-10-CM | POA: Diagnosis not present

## 2019-08-30 NOTE — Progress Notes (Signed)
Office Visit Note   Patient: Darryl [name]           Date of Birth: Aug 11, 1950           MRN: [id number] Visit Date: 08/30/2019              Requested by: Lucianne Lei, MD [address] [place],  [place] [address] PCP: Lucianne Lei, MD   Assessment & Plan: Visit Diagnoses:  1. Impingement syndrome of left shoulder   2. Other spondylosis with radiculopathy, cervical region     Plan: Patient has early tenosynovitis right thumb but no active triggering .  Cervical spondylosis symptoms of actually improved and we can wait on rescheduling a CT scan of the cervical spine until he starts having symptoms again if it does occur.  He has active triggering time he can return for injection.  We discussed avoidance of outstretched overhead activities where his left shoulder.  Follow-up as needed.  Follow-Up Instructions: Return if symptoms worsen or fail to improve.   Orders:  No orders of the defined types were placed in this encounter.  No orders of the defined types were placed in this encounter.     Procedures: No procedures performed   Clinical Data: No additional findings.   Subjective: Chief Complaint  Patient presents with  . Neck - Pain, Follow-up  . Left Shoulder - Pain  . Right Thumb - Pain, Follow-up    HPI patient returns for follow-up he was scheduled for CT scan of his neck but had GI problems for 3 days and called and canceled the scan.  He states surprisingly since the GI problems he had his neck is actually been doing better.  He has had some problems with his right thumb with pain over the A1 pulley but no triggering.  His wife remained in the splint on the palmar side of the thumb that seemed to help to some degree.  Patient is also had new problem with the left shoulder with outstretch reaching overhead activities and occasionally has to move his shoulder in a bypass manner to avoid areas where he has pain.  Patient is right-hand dominant.  Patient is used some  Advil with some relief.  No history of falls.  He does have diabetes and history of DVT.  Review of Systems 14 point update unchanged from 07/20/2018 other than as mentioned above.  Of note is positive for diabetes, DVT, history of subarachnoid hemorrhage.  Knee meniscal tears shoulder impingement left.  Cervical spondylosis multilevel mid cervical region. No current GI problems.  Objective: Vital Signs: BP 114/77   Pulse 60   Ht 5\' 11"  (1.803 m)   Wt 157 lb (71.2 kg)   BMI 21.90 kg/m   Physical Exam Constitutional:      Appearance: He is well-developed.  HENT:     Head: Normocephalic and atraumatic.  Eyes:     Pupils: Pupils are equal, round, and reactive to light.  Neck:     Thyroid: No thyromegaly.     Trachea: No tracheal deviation.  Cardiovascular:     Rate and Rhythm: Normal rate.  Pulmonary:     Effort: Pulmonary effort is normal.     Breath sounds: No wheezing.  Abdominal:     General: Bowel sounds are normal.     Palpations: Abdomen is soft.  Skin:    General: Skin is warm and dry.     Capillary Refill: Capillary refill takes less than 2 seconds.  Neurological:     Mental Status: He is alert and oriented to person, place, and time.  Psychiatric:        Behavior: Behavior normal.        Thought Content: Thought content normal.        Judgment: Judgment normal.     Ortho Exam Patient has some slight enlargement of the MP joint right dominant thumb and lacks hyperextension at the IP joint as he does on the opposite hand.  Tenderness over the A1 pulley but no active triggering.  He can fully flex and extend.  No palpable nodules over other A1 pulleys either hand.  He can flex his left shoulder within 15 degrees of symmetrical flexion.  Mild discomfort with Neer test negative Hawkins long head of the biceps minimally tender negative drop arm test.  Mild brachial plexus tenderness right and left today.  Mild increase with cervical compression. Specialty Comments:  No  specialty comments available.  Imaging: No results found.   PMFS History: Patient Active Problem List   Diagnosis Date Noted  . Other spondylosis with radiculopathy, cervical region 08/15/2019  . Unstable angina (HCC) 08/04/2019  . Chest pain 08/04/2019  . Trigger thumb, right thumb 06/08/2019  . Impingement syndrome of left shoulder 06/08/2019  . Chronic left shoulder pain 07/20/2018  . Subungual hematoma of foot, left, initial encounter 07/20/2018  . 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 Falls   . Acute deep vein thrombosis (DVT) of left femoral vein (HCC) 07/01/2015  . Pain   . Poor fluid intake   . Essential hypertension   . Type 2 diabetes mellitus with complication, without long-term current use of insulin (HCC)   . Disorientation   . Convulsions (HCC)   . Acute blood loss anemia   . Hypokalemia   . Ileus (HCC)   . Leukocytosis   . Right knee pain   . Cognitive safety issue   . Impulsiveness   . PVC (premature ventricular contraction)   . Abdominal discomfort   . SAH (subarachnoid hemorrhage) (HCC) 06/11/2015  . Emesis   . Subarachnoid hemorrhage (HCC) 06/10/2015  . Diabetes mellitus with coincident hypertension (HCC)   . Diabetes mellitus without complication [facility])    Past Medical History:  Diagnosis Date  . Acute deep vein thrombosis (DVT) of left femoral vein (HCC) 07/01/2015  . Anxiety   . Diabetes mellitus without complication (HCC)   . Epilepsy (HCC)   . Erectile dysfunction   . Hyperlipidemia   . Hypertension   . Seizures (HCC)     Family History  Problem Relation Age of Onset  . Alzheimer's disease Mother   . Diabetes Father   . Aneurysm Brother   . Aneurysm Sister     Past Surgical History:  Procedure Laterality Date  . CHOLECYSTECTOMY    . IR GENERIC HISTORICAL  05/16/2016   IR ANGIO VERTEBRAL SEL VERTEBRAL UNI L MOD SED 05/16/2016 [facility]  . IR GENERIC HISTORICAL   05/16/2016   IR ANGIOGRAM EXTREMITY RIGHT 05/16/2016 [facility]  . IR GENERIC HISTORICAL  05/16/2016   IR ANGIO INTRA EXTRACRAN SEL COM CAROTID INNOMINATE UNI R MOD SED 05/16/2016 [facility]  . KNEE SURGERY    . LEFT HEART CATH AND CORONARY ANGIOGRAPHY N/A 08/04/2019   Procedure: LEFT HEART CATH AND CORONARY ANGIOGRAPHY;  Surgeon: Elder Negus, MD;  Location: [facility];  Service: Cardiovascular;  Laterality: N/A;  . RADIOLOGY WITH ANESTHESIA N/A 06/11/2015  Procedure: RADIOLOGY WITH ANESTHESIA;  Surgeon: Lisbeth Renshaw, MD;  Location: [facility] OR;  Service: Radiology;  Laterality: N/A;   Social History   Occupational History  . Not on file  Tobacco Use  . Smoking status: Former Smoker    Packs/day: 2.00    Years: 15.00    Pack years: 30.00    Types: Cigarettes    Quit date: 06/02/1988    Years since quitting: 31.2  . Smokeless tobacco: Never Used  Substance and Sexual Activity  . Alcohol use: No  . Drug use: No  . Sexual activity: Not on file

## 2019-08-31 ENCOUNTER — Encounter: Payer: Self-pay | Admitting: General Practice

## 2019-09-16 ENCOUNTER — Other Ambulatory Visit: Payer: Self-pay | Admitting: Cardiology

## 2019-09-16 DIAGNOSIS — I209 Angina pectoris, unspecified: Secondary | ICD-10-CM

## 2019-09-20 ENCOUNTER — Telehealth: Payer: Self-pay | Admitting: Internal Medicine

## 2019-09-20 NOTE — Telephone Encounter (Signed)
Patient states he is returning a call. However, he is not sure what the call may have been in regards to.

## 2019-09-20 NOTE — Telephone Encounter (Signed)
Attempted to contact pt.  Call went to VM but VM was full.

## 2019-09-22 NOTE — Telephone Encounter (Signed)
Attempted phone call to pt.  Left detailed voicemail on pt's cell phone per active chart FYI, ok to do so.  Advised do not see where anyone attempted to contact pt but pt does have appointment 09/23/2019 with Dr Ladona Ridgel at [facility] and most likely call was related to the appointment.

## 2019-09-23 ENCOUNTER — Other Ambulatory Visit: Payer: Self-pay

## 2019-09-23 ENCOUNTER — Encounter: Payer: Self-pay | Admitting: Internal Medicine

## 2019-09-23 ENCOUNTER — Ambulatory Visit: Payer: Medicare PPO | Admitting: Internal Medicine

## 2019-09-23 VITALS — BP 106/62 | HR 59 | Ht 71.0 in | Wt 160.4 lb

## 2019-09-23 DIAGNOSIS — I493 Ventricular premature depolarization: Secondary | ICD-10-CM | POA: Diagnosis not present

## 2019-09-23 NOTE — Progress Notes (Signed)
HPI Darryl [name] is referred for evaluation of PVC's. 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 He does not experience palpitations. He has not had syncope.  No Known Allergies   Current Outpatient Medications  Medication Sig Dispense Refill  . aspirin EC 81 MG tablet Take 81 mg by mouth daily.    Marland Kitchen atorvastatin (LIPITOR) 20 MG tablet Take 1 tablet (20 mg total) by mouth at bedtime. 90 tablet 3  . FeFum-FePoly-FA-B Cmp-C-Biot (FOLIVANE-PLUS) CAPS Take 1 capsule by mouth 2 (two) times daily.    Marland Kitchen KLOR-CON M20 20 MEQ tablet TAKE 1 TABLET BY MOUTH AT BEDTIME 30 tablet 2  . lamoTRIgine (LAMICTAL) 100 MG tablet Take 100 mg by mouth 2 (two) times daily. Take with Lamotrigine 25 mg to equal 125 mg    . lamoTRIgine (LAMICTAL) 25 MG tablet Take 25 mg by mouth 2 (two) times daily. Take with lamotrigine 100 mg to equal 125 mg    . nitroGLYCERIN (NITROSTAT) 0.4 MG SL tablet Place 1 tablet (0.4 mg total) under the tongue every 5 (five) minutes as needed for chest pain. If you require more than two tablets 5 mins apart call 911. 100 tablet 1  . telmisartan-hydrochlorothiazide (MICARDIS HCT) 40-12.5 MG tablet Take 1 tablet by mouth daily.    . Vitamin D, Ergocalciferol, (DRISDOL) 50000 UNITS CAPS capsule Take 50,000 Units by mouth every Sunday.   5   No current facility-administered medications for this visit.     Past Medical History:  Diagnosis Date  . Acute deep vein thrombosis (DVT) of left femoral vein ([place]) 07/01/2015  . Anxiety   . Diabetes mellitus without complication ([place])   . Epilepsy ([place])   . Erectile dysfunction   . Hyperlipidemia   . Hypertension   . Seizures ([place])     ROS:   All systems reviewed and negative except as noted in the HPI.   Past Surgical History:  Procedure Laterality Date  . CHOLECYSTECTOMY    . IR GENERIC HISTORICAL  05/16/2016   IR ANGIO VERTEBRAL SEL VERTEBRAL UNI L  MOD SED 05/16/2016 [facility]  . IR GENERIC HISTORICAL  05/16/2016   IR ANGIOGRAM EXTREMITY RIGHT 05/16/2016 [facility]  . IR GENERIC HISTORICAL  05/16/2016   IR ANGIO INTRA EXTRACRAN SEL COM CAROTID INNOMINATE UNI R MOD SED 05/16/2016 [facility]  . KNEE SURGERY    . LEFT HEART CATH AND CORONARY ANGIOGRAPHY N/A 08/04/2019   Procedure: LEFT HEART CATH AND CORONARY ANGIOGRAPHY;  Surgeon: Nigel Mormon, MD;  Location: [place] CV [facility];  Service: Cardiovascular;  Laterality: N/A;  . RADIOLOGY WITH ANESTHESIA N/A 06/11/2015   Procedure: RADIOLOGY WITH ANESTHESIA;  Surgeon: [name], MD;  Location: [place];  Service: Radiology;  Laterality: N/A;     Family History  Problem Relation Age of Onset  . Alzheimer's disease Mother   . Diabetes Father   . Aneurysm Brother   . Aneurysm Sister      Social History   Socioeconomic History  . Marital status: Married    Spouse name: Not on file  . Number of children: 4  . Years of education: Not on file  . Highest education level: Not on file  Occupational History  . Not on file  Tobacco Use  . Smoking status: Former Smoker    Packs/day: 2.00    Years: 15.00  Pack years: 30.00    Types: Cigarettes    Quit date: 06/02/1988    Years since quitting: 31.3  . Smokeless tobacco: Never Used  Substance and Sexual Activity  . Alcohol use: No  . Drug use: No  . Sexual activity: Not on file  Other Topics Concern  . Not on file  Social History Narrative  . Not on file   Social Determinants of Health   Financial Resource Strain:   . Difficulty of Paying Living Expenses:   Food Insecurity:   . Worried About Programme researcher, broadcasting/film/video in the Last Year:   . Barista in the Last Year:   Transportation Needs:   . Freight forwarder (Medical):   Marland Kitchen Lack of Transportation (Non-Medical):   Physical Activity:   . Days of Exercise per Week:   . Minutes of Exercise per Session:   Stress:   . Feeling of Stress :   Social  Connections:   . Frequency of Communication with Friends and Family:   . Frequency of Social Gatherings with Friends and Family:   . Attends Religious Services:   . Active Member of Clubs or Organizations:   . Attends Banker Meetings:   Marland Kitchen Marital Status:   Intimate Partner Violence:   . Fear of Current or Ex-Partner:   . Emotionally Abused:   Marland Kitchen Physically Abused:   . Sexually Abused:      BP 106/62   Pulse (!) 59   Ht 5\' 11"  (1.803 m)   Wt 160 lb 6.4 oz (72.8 kg)   SpO2 97%   BMI 22.37 kg/m   Physical Exam:  Well appearing NAD HEENT: Unremarkable Neck:  No JVD, no thyromegally Lymphatics:  No adenopathy Back:  No CVA tenderness Lungs:  Clear with no wheezes HEART:  Regular rate rhythm, no murmurs, no rubs, no clicks Abd:  soft, positive bowel sounds, no organomegally, no rebound, no guarding Ext:  2 plus pulses, no edema, no cyanosis, no clubbing Skin:  No rashes no nodules Neuro:  CN II through XII intact, motor grossly intact  EKG - nsr  Assess/Plan: 1. PVC's - he does not have palpitations. I am not sure he has any symptoms. We discussed medical treatment. We discussed prognosis. His bp is a little on the low side. We discussed AA drug therapy. I think we should hold off on medical therapy, and I encouraged him to avoid caffeine and ETOH. If he develops symptoms then we will plan to try flecainide. If his bp increased, we would consider a beta blocker.  .D.

## 2019-09-23 NOTE — Patient Instructions (Addendum)
Medication Instructions:  Your physician recommends that you continue on your current medications as directed. Please refer to the Current Medication list given to you today.  Labwork: None ordered.  Testing/Procedures: None ordered.  Follow-Up: Your physician wants you to follow-up in: as needed with Dr. Taylor.      Any Other Special Instructions Will Be Listed Below (If Applicable).  If you need a refill on your cardiac medications before your next appointment, please call your pharmacy.   

## 2019-09-28 DIAGNOSIS — F4323 Adjustment disorder with mixed anxiety and depressed mood: Secondary | ICD-10-CM | POA: Diagnosis not present

## 2019-09-28 DIAGNOSIS — Z639 Problem related to primary support group, unspecified: Secondary | ICD-10-CM | POA: Diagnosis not present

## 2019-09-28 DIAGNOSIS — E559 Vitamin D deficiency, unspecified: Secondary | ICD-10-CM | POA: Diagnosis not present

## 2019-09-28 DIAGNOSIS — R5383 Other fatigue: Secondary | ICD-10-CM | POA: Diagnosis not present

## 2019-09-28 DIAGNOSIS — Z79899 Other long term (current) drug therapy: Secondary | ICD-10-CM | POA: Diagnosis not present

## 2019-10-03 DIAGNOSIS — I729 Aneurysm of unspecified site: Secondary | ICD-10-CM | POA: Diagnosis not present

## 2019-10-03 DIAGNOSIS — I119 Hypertensive heart disease without heart failure: Secondary | ICD-10-CM | POA: Diagnosis not present

## 2019-10-03 DIAGNOSIS — R413 Other amnesia: Secondary | ICD-10-CM | POA: Diagnosis not present

## 2019-10-03 DIAGNOSIS — E782 Mixed hyperlipidemia: Secondary | ICD-10-CM | POA: Diagnosis not present

## 2019-10-05 DIAGNOSIS — I609 Nontraumatic subarachnoid hemorrhage, unspecified: Secondary | ICD-10-CM | POA: Diagnosis not present

## 2019-10-05 DIAGNOSIS — I671 Cerebral aneurysm, nonruptured: Secondary | ICD-10-CM | POA: Diagnosis not present

## 2019-10-05 DIAGNOSIS — I6501 Occlusion and stenosis of right vertebral artery: Secondary | ICD-10-CM | POA: Diagnosis not present

## 2019-11-02 DIAGNOSIS — I609 Nontraumatic subarachnoid hemorrhage, unspecified: Secondary | ICD-10-CM | POA: Diagnosis not present

## 2019-11-02 DIAGNOSIS — I10 Essential (primary) hypertension: Secondary | ICD-10-CM | POA: Diagnosis not present

## 2019-11-18 ENCOUNTER — Other Ambulatory Visit: Payer: Self-pay

## 2019-11-18 ENCOUNTER — Ambulatory Visit: Payer: Medicare PPO | Admitting: Cardiology

## 2019-11-18 ENCOUNTER — Encounter: Payer: Self-pay | Admitting: Cardiology

## 2019-11-18 VITALS — BP 135/70 | HR 35 | Ht 71.0 in | Wt 163.0 lb

## 2019-11-18 DIAGNOSIS — Z87891 Personal history of nicotine dependence: Secondary | ICD-10-CM | POA: Diagnosis not present

## 2019-11-18 DIAGNOSIS — E119 Type 2 diabetes mellitus without complications: Secondary | ICD-10-CM | POA: Diagnosis not present

## 2019-11-18 DIAGNOSIS — Z79899 Other long term (current) drug therapy: Secondary | ICD-10-CM | POA: Diagnosis not present

## 2019-11-18 DIAGNOSIS — I493 Ventricular premature depolarization: Secondary | ICD-10-CM | POA: Diagnosis not present

## 2019-11-18 DIAGNOSIS — E782 Mixed hyperlipidemia: Secondary | ICD-10-CM | POA: Diagnosis not present

## 2019-11-18 DIAGNOSIS — R001 Bradycardia, unspecified: Secondary | ICD-10-CM

## 2019-11-18 DIAGNOSIS — I10 Essential (primary) hypertension: Secondary | ICD-10-CM

## 2019-11-18 MED ORDER — FLECAINIDE ACETATE 50 MG PO TABS: 50.0000 mg | ORAL_TABLET | Freq: Two times a day (BID) | ORAL | 0 refills | Status: DC

## 2019-11-18 NOTE — Progress Notes (Signed)
Chief Complaint  Patient presents with  . Bradycardia    Fatigue and tired    REQUESTING PHYSICIAN:  Renaye Rakers, MD [address] [place],  [place] [address]  HPI  Darryl [name] is a 69 y.o. male who presents to the office with a chief complaint of " tired and fatigue." Patient's past medical history and cardiac risk factors include: Diet-controlled diabetes mellitus type 2, hyperlipidemia, hypertension, advanced age.   Patient presents today for follow-up there is concern of an underlying fatigue and tiredness.  Patient's vital signs notable for 35 bpm the EKG shows ventricular bigeminy with a ventricular rate of 67bpm.  Currently patient denies any lightheadedness or dizziness or passing out.  However patient states that when he is physically active such as going up a hill he does feel more tired and fatigued.  He denies any chest pain and recent use of sublingual nitroglycerin tablets.  He was requested to see Dr. Sharrell Ku for evaluation of PVC burden.  Prior to establishing care with myself he had a monitor performed with his primary care provider which noted a ventricular ectopic burden of approximately 16%.  When the patient saw Dr. Sharrell Ku he was asymptomatic and therefore no medical therapy was recommended at that time.  However, if he does develop symptoms plan start flecainide.   ALLERGIES: No Known Allergies   MEDICATION LIST PRIOR TO VISIT: Current Outpatient Medications on File Prior to Visit  Medication Sig Dispense Refill  . aspirin EC 81 MG tablet Take 81 mg by mouth daily.    Marland Kitchen atorvastatin (LIPITOR) 20 MG tablet Take 1 tablet (20 mg total) by mouth at bedtime. 90 tablet 3  . FeFum-FePoly-FA-B Cmp-C-Biot (FOLIVANE-PLUS) CAPS Take 1 capsule by mouth 2 (two) times daily.    Marland Kitchen KLOR-CON M20 20 MEQ tablet TAKE 1 TABLET BY MOUTH AT BEDTIME 30 tablet 2  . lamoTRIgine (LAMICTAL) 100 MG tablet Take 100 mg by mouth 2 (two) times daily. Take with Lamotrigine 25 mg to  equal 125 mg    . lamoTRIgine (LAMICTAL) 25 MG tablet Take 25 mg by mouth 2 (two) times daily. Take with lamotrigine 100 mg to equal 125 mg    . nitroGLYCERIN (NITROSTAT) 0.4 MG SL tablet Place 1 tablet (0.4 mg total) under the tongue every 5 (five) minutes as needed for chest pain. If you require more than two tablets 5 mins apart call 911. 100 tablet 1  . tadalafil (CIALIS) 5 MG tablet Take 5 mg by mouth daily as needed for erectile dysfunction.    Marland Kitchen telmisartan-hydrochlorothiazide (MICARDIS HCT) 40-12.5 MG tablet Take 1 tablet by mouth daily.    . Vitamin D, Ergocalciferol, (DRISDOL) 50000 UNITS CAPS capsule Take 50,000 Units by mouth every Sunday.   5   No current facility-administered medications on file prior to visit.    PAST MEDICAL HISTORY: Past Medical History:  Diagnosis Date  . Acute deep vein thrombosis (DVT) of left femoral vein (HCC) 07/01/2015  . Anxiety   . Diabetes mellitus without complication (HCC)   . Epilepsy (HCC)   . Erectile dysfunction   . Hyperlipidemia   . Hypertension   . Seizures (HCC)     PAST SURGICAL HISTORY: Past Surgical History:  Procedure Laterality Date  . CHOLECYSTECTOMY    . IR GENERIC HISTORICAL  05/16/2016   IR ANGIO VERTEBRAL SEL VERTEBRAL UNI L MOD SED 05/16/2016 [facility]  . IR GENERIC HISTORICAL  05/16/2016   IR ANGIOGRAM EXTREMITY RIGHT  05/16/2016 [facility]  . IR GENERIC HISTORICAL  05/16/2016   IR ANGIO INTRA EXTRACRAN SEL COM CAROTID INNOMINATE UNI R MOD SED 05/16/2016 [facility]  . KNEE SURGERY    . LEFT HEART CATH AND CORONARY ANGIOGRAPHY N/A 08/04/2019   Procedure: LEFT HEART CATH AND CORONARY ANGIOGRAPHY;  Surgeon: Elder Negus, MD;  Location: [facility];  Service: Cardiovascular;  Laterality: N/A;  . RADIOLOGY WITH ANESTHESIA N/A 06/11/2015   Procedure: RADIOLOGY WITH ANESTHESIA;  Surgeon: Lisbeth Renshaw, MD;  Location: [facility] OR;  Service: Radiology;  Laterality: N/A;    FAMILY HISTORY: The patient  family history includes Alzheimer's disease in his mother; Aneurysm in his brother and sister; Diabetes in his father.   SOCIAL HISTORY:  The patient  reports that he quit smoking about 31 years ago. His smoking use included cigarettes. He has a 30.00 pack-year smoking history. He has never used smokeless tobacco. He reports that he does not drink alcohol and does not use drugs.  14 ORGAN REVIEW OF SYSTEMS: CONSTITUTIONAL: No fever or significant weight loss EYES: No recent significant visual change EARS, NOSE, MOUTH, THROAT: No recent significant change in hearing CARDIOVASCULAR: See discussion in subjective/HPI RESPIRATORY: See discussion in subjective/HPI GASTROINTESTINAL: No recent complaints of abdominal pain GENITOURINARY: No recent significant change in genitourinary status MUSCULOSKELETAL: No recent significant change in musculoskeletal status INTEGUMENTARY: No recent rash NEUROLOGIC: No recent significant change in motor function PSYCHIATRIC: No recent significant change in mood ENDOCRINOLOGIC: No recent significant change in endocrine status HEMATOLOGIC/LYMPHATIC: No recent significant unexpected bruising ALLERGIC/IMMUNOLOGIC: No recent unexplained allergic reaction  PHYSICAL EXAM: Vitals with BMI 11/18/2019 09/23/2019 08/30/2019  Height 5\' 11"  5\' 11"  5\' 11"   Weight 163 lbs 160 lbs 6 oz 157 lbs  BMI 22.74 22.38 21.91  Systolic 135 106  Diastolic 70 62 77  Pulse 35 59 60   CONSTITUTIONAL: Well-developed and well-nourished. No acute distress.  SKIN: Skin is warm and dry. No rash noted. No cyanosis. No pallor. No jaundice HEAD: Normocephalic and atraumatic.  EYES: No scleral icterus MOUTH/THROAT: Moist oral membranes.  NECK: No JVD present. No thyromegaly noted. No carotid bruits  LYMPHATIC: No visible cervical adenopathy.  CHEST Normal respiratory effort. No intercostal retractions  LUNGS: Clear to auscultation bilaterally.  No stridor. No wheezes. No rales.    CARDIOVASCULAR: Bradycardia, regular, positive S1-S2, no murmurs rubs or gallops appreciated ABDOMINAL: Nonobese, soft, nontender, nondistended positive bowel sounds.  No apparent ascites.  EXTREMITIES: No peripheral edema  HEMATOLOGIC: No significant bruising NEUROLOGIC: Oriented to person, place, and time. Nonfocal. Normal muscle tone.  PSYCHIATRIC: Normal mood and affect. Normal behavior. Cooperative  CARDIAC DATABASE: EKG: 08/03/2019: Sinus bradycardia with ventricular rate of 54 bpm, poor R wave progression, without underlying new injury pattern.  Echocardiogram: March 2021:LVEF 65 to 70%, no regional wall motion abnormalities, normal diastolic function, mildly dilated left atrium, mildly dilated right atrium, no significant valvular abnormality.   Stress Testing:  None  Heart Catheterization: 08/04/2019: Right dominant circulation with tortuous, small-medium caliber vessels. TIMI III flow. No angiographically significant coronary artery disease. Normal LVEDP. Normal LVEF.  MCT 06/27/2019-07/26/2019: Minimum heart rate was noted to be 46 bpm on June 30, 2019 at 1:37 PM. Maximum heart rate was noted to be 120 bpm on July 14, 2019 at 6:19 PM. Average heart rate 70 bpm. No atrial fibrillation detected during the monitoring period. Ventricular ectopic burden reported to be 16%. Patient triggered events illustrate underlying sinus mechanism and at times ventricular ectopy.  Please refer to the official report for more details.  LABORATORY DATA: CBC Latest Ref Rng & Units 08/03/2019 07/14/2017 05/16/2016  WBC 4.0 - 10.5 K/uL 6.3 - 5.8  Hemoglobin 13.0 - 17.0 g/dL 22.9 79.8 92.1  Hematocrit 39 - 52 % 43.4 50.0 42.6  Platelets 150 - 400 K/uL 217 - 244    CMP Latest Ref Rng & Units 11/18/2019 08/03/2019 07/14/2017  Glucose 65 - 99 mg/dL 194(R) 740(C) 144(Y)  BUN 8 - 27 mg/dL 16 15 18(H)  Creatinine 0.76 - 1.27 mg/dL 6.31 4.97 0.26(V)  Sodium 134 - 144 mmol/L 147(H) 142 140  Potassium  3.5 - 5.2 mmol/L 4.8 3.7 3.8  Chloride 96 - 106 mmol/L 106 104 101  CO2 20 - 29 mmol/L 26 27 -  Calcium 8.6 - 10.2 mg/dL 78.5 9.8 -  Total Protein 6.5 - 8.1 g/dL - - -  Total Bilirubin 0.3 - 1.2 mg/dL - - -  Alkaline Phos 38 - 126 U/L - - -  AST 15 - 41 U/L - - -  ALT 17 - 63 U/L - - -   External Labs: Collected: 06/13/2018 Lipid profile: Total cholesterol 220, triglycerides 69, HDL 72, LDL 132 Hemoglobin A1c: 6.5  12/31/2018 TSH: 1.63  07/2017 serum creatinine 1.4 mg/dL.  FINAL MEDICATION LIST END OF ENCOUNTER: Meds ordered this encounter  Medications  . flecainide (TAMBOCOR) 50 MG tablet    Sig: Take 1 tablet (50 mg total) by mouth 2 (two) times daily.    Dispense:  60 tablet    Refill:  0    There are no discontinued medications.   Current Outpatient Medications:  .  aspirin EC 81 MG tablet, Take 81 mg by mouth daily., Disp: , Rfl:  .  atorvastatin (LIPITOR) 20 MG tablet, Take 1 tablet (20 mg total) by mouth at bedtime., Disp: 90 tablet, Rfl: 3 .  FeFum-FePoly-FA-B Cmp-C-Biot (FOLIVANE-PLUS) CAPS, Take 1 capsule by mouth 2 (two) times daily., Disp: , Rfl:  .  KLOR-CON M20 20 MEQ tablet, TAKE 1 TABLET BY MOUTH AT BEDTIME, Disp: 30 tablet, Rfl: 2 .  lamoTRIgine (LAMICTAL) 100 MG tablet, Take 100 mg by mouth 2 (two) times daily. Take with Lamotrigine 25 mg to equal 125 mg, Disp: , Rfl:  .  lamoTRIgine (LAMICTAL) 25 MG tablet, Take 25 mg by mouth 2 (two) times daily. Take with lamotrigine 100 mg to equal 125 mg, Disp: , Rfl:  .  nitroGLYCERIN (NITROSTAT) 0.4 MG SL tablet, Place 1 tablet (0.4 mg total) under the tongue every 5 (five) minutes as needed for chest pain. If you require more than two tablets 5 mins apart call 911., Disp: 100 tablet, Rfl: 1 .  tadalafil (CIALIS) 5 MG tablet, Take 5 mg by mouth daily as needed for erectile dysfunction., Disp: , Rfl:  .  telmisartan-hydrochlorothiazide (MICARDIS HCT) 40-12.5 MG tablet, Take 1 tablet by mouth daily., Disp: , Rfl:  .  Vitamin  D, Ergocalciferol, (DRISDOL) 50000 UNITS CAPS capsule, Take 50,000 Units by mouth every Sunday. , Disp: , Rfl: 5 .  flecainide (TAMBOCOR) 50 MG tablet, Take 1 tablet (50 mg total) by mouth 2 (two) times daily., Disp: 60 tablet, Rfl: 0  IMPRESSION:    ICD-10-CM   1. Bradycardia  R00.1 EKG 12-Lead    flecainide (TAMBOCOR) 50 MG tablet  2. PVC (premature ventricular contraction)  I49.3 EKG 12-Lead    flecainide (TAMBOCOR) 50 MG tablet  3. Diabetes mellitus with coincident hypertension (HCC)  E11.9  I10   4. Essential hypertension  I10   5. Former smoker  Z87.891   29. Mixed hyperlipidemia  E78.2   7. Long term current use of antiarrhythmic drug  Q00.867 Basic metabolic panel    Magnesium     RECOMMENDATIONS: Darryl [name] is a 69 y.o. male whose past medical history and cardiac risk factors include: Diet-controlled diabetes mellitus type 2, hyperlipidemia, hypertension, advanced age.   Bradycardia, symptomatic:  Patient's ventricular rate as per EKG is greater than 60 bpm.  He does have frequent ventricular ectopy in a bigeminal pattern.    We had a long discussion in regards to the patient's symptoms, prior monitor findings, and recent evaluation by electrophysiology.  Recommended that he make an appointment to see EP again given his symptoms but patient refuses.    Discussed the risks, benefits, and alternatives to antiarrhythmic therapy.  Patient is agreeable to start flecainide.  We will start at 50 mg p.o. twice daily.  I will repeat labs show the kidney function and electrolytes are within normal limits and follow him closely in the following weeks.    Long-term antiarrhythmic: Start flecainide 50 mg p.o. twice daily as discussed above.  Mixed hyperlipidemia:  Patient was started on Lipitor 20 mg p.o. nightly at the last office visit given his estimated 10-year ASCVD risk score.  Patient tolerated the medication well without any side effects or intolerances.   Continue  Lipitor 20 mg p.o. nightly.    Recommended 6 weeks lipid profile to reevaluate therapy.  He will have this addressed with his primary care physician.  Benign essential hypertension: . Medication reconciled.  . Low salt diet recommended. A diet that is rich in fruits, vegetables, legumes, and low-fat dairy products and low in snacks, sweets, and meats (such as the Dietary Approaches to Stop Hypertension [DASH] diet).   Non-insulin-dependent diabetes mellitus type 2, diet controlled: Management per primary care team.  Total encounter time 40 minutes; spent face-to-face discussing results of the prior diagnostic testing and symptoms.  Educating him prior to initiating antiarrhythmic medication, ordering labs, recommending close follow-up.  Orders Placed This Encounter  Procedures  . Basic metabolic panel  . Magnesium  . EKG 12-Lead   --Continue cardiac medications as reconciled in final medication list. --Return in about 1 week (around 11/25/2019) for re-evaluation of symptoms and addition of AAD EKG on arrival .. Or sooner if needed. --Continue follow-up with your primary care physician regarding the management of your other chronic comorbid conditions.  Patient's questions and concerns were addressed to his satisfaction. He voices understanding of the instructions provided during this encounter.   This note was created using a voice recognition software as a result there may be grammatical errors inadvertently enclosed that do not reflect the nature of this encounter. Every attempt is made to correct such errors.  Rex Kras, DO, [facility]. [place] Office: [phone]

## 2019-11-19 LAB — BASIC METABOLIC PANEL
BUN/Creatinine Ratio: 14 (ref 10–24)
BUN: 16 mg/dL (ref 8–27)
CO2: 26 mmol/L (ref 20–29)
Calcium: 10 mg/dL (ref 8.6–10.2)
Chloride: 106 mmol/L (ref 96–106)
Creatinine, Ser: 1.15 mg/dL (ref 0.76–1.27)
GFR calc Af Amer: 76 mL/min/{1.73_m2} (ref >59)
GFR calc non Af Amer: 65 mL/min/{1.73_m2} (ref >59)
Glucose: 124 mg/dL — ABNORMAL HIGH (ref 65–99)
Potassium: 4.8 mmol/L (ref 3.5–5.2)
Sodium: 147 mmol/L — ABNORMAL HIGH (ref 134–144)

## 2019-11-19 LAB — MAGNESIUM: Magnesium: 2.1 mg/dL (ref 1.6–2.3)

## 2019-11-29 ENCOUNTER — Ambulatory Visit: Payer: Medicare PPO | Admitting: Cardiology

## 2019-11-29 ENCOUNTER — Encounter: Payer: Self-pay | Admitting: Cardiology

## 2019-11-29 ENCOUNTER — Other Ambulatory Visit: Payer: Self-pay

## 2019-11-29 VITALS — BP 132/62 | HR 40 | Ht 71.0 in | Wt 164.0 lb

## 2019-11-29 DIAGNOSIS — E782 Mixed hyperlipidemia: Secondary | ICD-10-CM | POA: Diagnosis not present

## 2019-11-29 DIAGNOSIS — I493 Ventricular premature depolarization: Secondary | ICD-10-CM | POA: Diagnosis not present

## 2019-11-29 DIAGNOSIS — E119 Type 2 diabetes mellitus without complications: Secondary | ICD-10-CM | POA: Diagnosis not present

## 2019-11-29 DIAGNOSIS — I10 Essential (primary) hypertension: Secondary | ICD-10-CM | POA: Diagnosis not present

## 2019-11-29 DIAGNOSIS — Z79899 Other long term (current) drug therapy: Secondary | ICD-10-CM | POA: Diagnosis not present

## 2019-11-29 DIAGNOSIS — I209 Angina pectoris, unspecified: Secondary | ICD-10-CM

## 2019-11-29 DIAGNOSIS — R001 Bradycardia, unspecified: Secondary | ICD-10-CM

## 2019-11-29 DIAGNOSIS — Z87891 Personal history of nicotine dependence: Secondary | ICD-10-CM | POA: Diagnosis not present

## 2019-11-29 MED ORDER — FLECAINIDE ACETATE 100 MG PO TABS: 100.0000 mg | ORAL_TABLET | Freq: Two times a day (BID) | ORAL | 3 refills | Status: DC

## 2019-11-29 NOTE — Progress Notes (Signed)
Chief Complaint  Patient presents with  . Follow-up    PVC and recent medication changes.     REQUESTING PHYSICIAN:  Renaye Rakers, MD [address] [place],  [place] [address]  HPI  Brydan Downard is a 69 y.o. male who presents to the office with a chief complaint of " recent medication changes.." Patient's past medical history and cardiac risk factors include: Diet-controlled diabetes mellitus type 2, hyperlipidemia, hypertension, advanced age.   Patient was evaluated approximately 1 month ago because of fatigue and tiredness that was getting progressively worse.  Patient has noted to have an elevated PVC burden and has seen EP in the past.  They recommended initiation of flecainide at his last EP office visit but it was held off secondary to being asymptomatic.  However at last office visit patient was started on flecainide 50 mg p.o. twice daily because he was very tired and fatigue.  Patient has tolerated the medications well without any side effects or intolerances.  Patient states that his fatigue and tiredness has improved up to 80% compared to baseline.    EKG today still shows sinus rhythm with frequent PVCs and ventricular bigeminy pattern.  Patient is willing to uptitrate flecainide 100 mg p.o. twice daily.  ALLERGIES: No Known Allergies   MEDICATION LIST PRIOR TO VISIT: Current Outpatient Medications on File Prior to Visit  Medication Sig Dispense Refill  . aspirin EC 81 MG tablet Take 81 mg by mouth daily.    Marland Kitchen atorvastatin (LIPITOR) 20 MG tablet Take 1 tablet (20 mg total) by mouth at bedtime. 90 tablet 3  . FeFum-FePoly-FA-B Cmp-C-Biot (FOLIVANE-PLUS) CAPS Take 1 capsule by mouth 2 (two) times daily.    Marland Kitchen KLOR-CON M20 20 MEQ tablet TAKE 1 TABLET BY MOUTH AT BEDTIME 30 tablet 2  . lamoTRIgine (LAMICTAL) 100 MG tablet Take 100 mg by mouth 2 (two) times daily. Take with Lamotrigine 25 mg to equal 125 mg    . tadalafil (CIALIS) 5 MG tablet Take 5 mg by mouth daily as  needed for erectile dysfunction.    Marland Kitchen telmisartan-hydrochlorothiazide (MICARDIS HCT) 40-12.5 MG tablet Take 1 tablet by mouth daily.    . Vitamin D, Ergocalciferol, (DRISDOL) 50000 UNITS CAPS capsule Take 50,000 Units by mouth every Sunday.   5  . nitroGLYCERIN (NITROSTAT) 0.4 MG SL tablet Place 1 tablet (0.4 mg total) under the tongue every 5 (five) minutes as needed for chest pain. If you require more than two tablets 5 mins apart call 911. 100 tablet 1   No current facility-administered medications on file prior to visit.    PAST MEDICAL HISTORY: Past Medical History:  Diagnosis Date  . Acute deep vein thrombosis (DVT) of left femoral vein (HCC) 07/01/2015  . Anxiety   . Diabetes mellitus without complication (HCC)   . Epilepsy (HCC)   . Erectile dysfunction   . Hyperlipidemia   . Hypertension   . Seizures (HCC)     PAST SURGICAL HISTORY: Past Surgical History:  Procedure Laterality Date  . CHOLECYSTECTOMY    . IR GENERIC HISTORICAL  05/16/2016   IR ANGIO VERTEBRAL SEL VERTEBRAL UNI L MOD SED 05/16/2016 [facility]  . IR GENERIC HISTORICAL  05/16/2016   IR ANGIOGRAM EXTREMITY RIGHT 05/16/2016 [facility]  . IR GENERIC HISTORICAL  05/16/2016   IR ANGIO INTRA EXTRACRAN SEL COM CAROTID INNOMINATE UNI R MOD SED 05/16/2016 [facility]  . KNEE SURGERY    . LEFT HEART CATH AND CORONARY ANGIOGRAPHY  N/A 08/04/2019   Procedure: LEFT HEART CATH AND CORONARY ANGIOGRAPHY;  Surgeon: Elder Negus, MD;  [facility];  Service: Cardiovascular;  Laterality: N/A;  . RADIOLOGY WITH ANESTHESIA N/A 06/11/2015   Procedure: RADIOLOGY WITH ANESTHESIA;  Surgeon: Lisbeth Renshaw, MD;  Location: [facility] OR;  Service: Radiology;  Laterality: N/A;    FAMILY HISTORY: The patient family history includes Alzheimer's disease in his mother; Aneurysm in his brother and sister; Diabetes in his father.   SOCIAL HISTORY:  The patient  reports that he quit smoking about 31 years ago. His  smoking use included cigarettes. He has a 30.00 pack-year smoking history. He has never used smokeless tobacco. He reports that he does not drink alcohol and does not use drugs.  Review of Systems  Constitutional: Negative for chills and fever.  HENT: Negative for hoarse voice and nosebleeds.   Eyes: Negative for discharge, double vision and pain.  Cardiovascular: Negative for chest pain, claudication, dyspnea on exertion, leg swelling, near-syncope, orthopnea, palpitations, paroxysmal nocturnal dyspnea and syncope.  Respiratory: Negative for hemoptysis and shortness of breath.   Musculoskeletal: Negative for muscle cramps and myalgias.  Gastrointestinal: Negative for abdominal pain, constipation, diarrhea, hematemesis, hematochezia, melena, nausea and vomiting.  Neurological: Negative for dizziness and light-headedness.     PHYSICAL EXAM: Vitals with BMI 11/29/2019 11/18/2019 09/23/2019  Height 5\' 11"  5\' 11"  5\' 11"   Weight 164 lbs 163 lbs 160 lbs 6 oz  BMI 22.88 22.74 22.38  Systolic 132 135  Diastolic 62 70 62  Pulse 40 35 59   CONSTITUTIONAL: Well-developed and well-nourished. No acute distress.  SKIN: Skin is warm and dry. No rash noted. No cyanosis. No pallor. No jaundice HEAD: Normocephalic and atraumatic.  EYES: No scleral icterus MOUTH/THROAT: Moist oral membranes.  NECK: No JVD present. No thyromegaly noted. No carotid bruits  LYMPHATIC: No visible cervical adenopathy.  CHEST Normal respiratory effort. No intercostal retractions  LUNGS: Clear to auscultation bilaterally.  No stridor. No wheezes. No rales.  CARDIOVASCULAR: Bradycardia, regular, positive S1-S2, no murmurs rubs or gallops appreciated ABDOMINAL: Nonobese, soft, nontender, nondistended positive bowel sounds.  No apparent ascites.  EXTREMITIES: No peripheral edema  HEMATOLOGIC: No significant bruising NEUROLOGIC: Oriented to person, place, and time. Nonfocal. Normal muscle tone.  PSYCHIATRIC: Normal mood and  affect. Normal behavior. Cooperative  CARDIAC DATABASE: EKG: 08/03/2019: Sinus bradycardia with ventricular rate of 54 bpm, poor R wave progression, without underlying new injury pattern. 11/29/2019: Sinus rhythm borderline, frequent PVCs and bigeminal pattern.  Echocardiogram: March 2021:LVEF 65 to 70%, no regional wall motion abnormalities, normal diastolic function, mildly dilated left atrium, mildly dilated right atrium, no significant valvular abnormality.   Stress Testing:  None  Heart Catheterization: 08/04/2019: Right dominant circulation with tortuous, small-medium caliber vessels. TIMI III flow. No angiographically significant coronary artery disease. Normal LVEDP. Normal LVEF.  MCT 06/27/2019-07/26/2019: Minimum heart rate was noted to be 46 bpm on June 30, 2019 at 1:37 PM. Maximum heart rate was noted to be 120 bpm on July 14, 2019 at 6:19 PM. Average heart rate 70 bpm. No atrial fibrillation detected during the monitoring period. Ventricular ectopic burden reported to be 16%. Patient triggered events illustrate underlying sinus mechanism and at times ventricular ectopy.  Please refer to the official report for more details.  LABORATORY DATA: CBC Latest Ref Rng & Units 08/03/2019 07/14/2017 05/16/2016  WBC 4.0 - 10.5 K/uL 6.3 - 5.8  Hemoglobin 13.0 - 17.0 g/dL 10/03/2019 09/11/2017 05/18/2016  Hematocrit 39 -  52 % 43.4 50.0 42.6  Platelets 150 - 400 K/uL 217 - 244    CMP Latest Ref Rng & Units 11/18/2019 08/03/2019 07/14/2017  Glucose 65 - 99 mg/dL 161(W124(H) 960(A126(H) 540(J145(H)  BUN 8 - 27 mg/dL 16 15 81(X23(H)  Creatinine 0.76 - 1.27 mg/dL 9.141.15 7.821.12 9.56(O1.40(H)  Sodium 134 - 144 mmol/L 147(H) 142 140  Potassium 3.5 - 5.2 mmol/L 4.8 3.7 3.8  Chloride 96 - 106 mmol/L 106 104 101  CO2 20 - 29 mmol/L 26 27 -  Calcium 8.6 - 10.2 mg/dL 13.010.0 9.8 -  Total Protein 6.5 - 8.1 g/dL - - -  Total Bilirubin 0.3 - 1.2 mg/dL - - -  Alkaline Phos 38 - 126 U/L - - -  AST 15 - 41 U/L - - -  ALT 17 - 63 U/L - - -    External Labs: Collected: 06/13/2018 Lipid profile: Total cholesterol 220, triglycerides 69, HDL 72, LDL 132 Hemoglobin A1c: 6.5  12/31/2018 TSH: 1.63  07/2017 serum creatinine 1.4 mg/dL.  FINAL MEDICATION LIST END OF ENCOUNTER: Meds ordered this encounter  Medications  . flecainide (TAMBOCOR) 100 MG tablet    Sig: Take 1 tablet (100 mg total) by mouth 2 (two) times daily.    Dispense:  180 tablet    Refill:  3    Medications Discontinued During This Encounter  Medication Reason  . lamoTRIgine (LAMICTAL) 25 MG tablet Dose change  . flecainide (TAMBOCOR) 50 MG tablet Dose change     Current Outpatient Medications:  .  aspirin EC 81 MG tablet, Take 81 mg by mouth daily., Disp: , Rfl:  .  atorvastatin (LIPITOR) 20 MG tablet, Take 1 tablet (20 mg total) by mouth at bedtime., Disp: 90 tablet, Rfl: 3 .  FeFum-FePoly-FA-B Cmp-C-Biot (FOLIVANE-PLUS) CAPS, Take 1 capsule by mouth 2 (two) times daily., Disp: , Rfl:  .  KLOR-CON M20 20 MEQ tablet, TAKE 1 TABLET BY MOUTH AT BEDTIME, Disp: 30 tablet, Rfl: 2 .  lamoTRIgine (LAMICTAL) 100 MG tablet, Take 100 mg by mouth 2 (two) times daily. Take with Lamotrigine 25 mg to equal 125 mg, Disp: , Rfl:  .  tadalafil (CIALIS) 5 MG tablet, Take 5 mg by mouth daily as needed for erectile dysfunction., Disp: , Rfl:  .  telmisartan-hydrochlorothiazide (MICARDIS HCT) 40-12.5 MG tablet, Take 1 tablet by mouth daily., Disp: , Rfl:  .  Vitamin D, Ergocalciferol, (DRISDOL) 50000 UNITS CAPS capsule, Take 50,000 Units by mouth every Sunday. , Disp: , Rfl: 5 .  flecainide (TAMBOCOR) 100 MG tablet, Take 1 tablet (100 mg total) by mouth 2 (two) times daily., Disp: 180 tablet, Rfl: 3 .  nitroGLYCERIN (NITROSTAT) 0.4 MG SL tablet, Place 1 tablet (0.4 mg total) under the tongue every 5 (five) minutes as needed for chest pain. If you require more than two tablets 5 mins apart call 911., Disp: 100 tablet, Rfl: 1  IMPRESSION:    ICD-10-CM   1. PVC (premature  ventricular contraction)  I49.3 EKG 12-Lead    flecainide (TAMBOCOR) 100 MG tablet  2. Angina pectoris (HCC)  I20.9   3. Bradycardia  R00.1   4. Diabetes mellitus with coincident hypertension (HCC)  E11.9    I10   5. Essential hypertension  I10   6. Former smoker  Z87.891   7. Mixed hyperlipidemia  E78.2   8. Long term current use of antiarrhythmic drug  Z79.899 flecainide (TAMBOCOR) 100 MG tablet    Basic metabolic panel    Magnesium  RECOMMENDATIONS: Kahiau Schewe is a 69 y.o. male whose past medical history and cardiac risk factors include: Diet-controlled diabetes mellitus type 2, hyperlipidemia, hypertension, advanced age.   Bradycardia, asymptomatic:  Patient's ventricular rate as per EKG is greater than 60 bpm.  He does have frequent ventricular ectopy in a bigeminal pattern.    Increase flecainide 100 mg p.o. twice daily  Check BMP and magnesium level prior to the next office visit.   We will consider repeat monitor at the next visit to reevaluate PVC burden.   If patient continues to have an elevated PVC burden despite being on flecainide we discussed possibly seeing EP to be considered for PVC ablation.  Long-term antiarrhythmic: Increase flecainide 100 mg p.o. twice daily as discussed above.  Mixed hyperlipidemia:  Continue Lipitor 20 mg p.o. nightly.  Patient tolerated the medication well without any side effects or intolerances.   Benign essential hypertension: . Medication reconciled.  . Low salt diet recommended. A diet that is rich in fruits, vegetables, legumes, and low-fat dairy products and low in snacks, sweets, and meats (such as the Dietary Approaches to Stop Hypertension [DASH] diet).   Non-insulin-dependent diabetes mellitus type 2, diet controlled: Management per primary care team.  Orders Placed This Encounter  Procedures  . Basic metabolic panel  . Magnesium  . EKG 12-Lead   --Continue cardiac medications as reconciled in final medication  list. --Return in about 6 weeks (around 01/10/2020) for PVC and AAD management. .. Or sooner if needed. --Continue follow-up with your primary care physician regarding the management of your other chronic comorbid conditions.  Patient's questions and concerns were addressed to his satisfaction. He voices understanding of the instructions provided during this encounter.   This note was created using a voice recognition software as a result there may be grammatical errors inadvertently enclosed that do not reflect the nature of this encounter. Every attempt is made to correct such errors.  Tessa Lerner, DO, [facility] Office: [phone]

## 2019-11-29 NOTE — Patient Instructions (Signed)
Labs 01/01/2020

## 2019-12-10 ENCOUNTER — Other Ambulatory Visit: Payer: Self-pay | Admitting: Cardiology

## 2019-12-10 DIAGNOSIS — I493 Ventricular premature depolarization: Secondary | ICD-10-CM

## 2019-12-10 DIAGNOSIS — R001 Bradycardia, unspecified: Secondary | ICD-10-CM

## 2019-12-13 ENCOUNTER — Other Ambulatory Visit: Payer: Self-pay

## 2019-12-13 DIAGNOSIS — R413 Other amnesia: Secondary | ICD-10-CM | POA: Diagnosis not present

## 2019-12-13 DIAGNOSIS — Z79899 Other long term (current) drug therapy: Secondary | ICD-10-CM

## 2019-12-13 DIAGNOSIS — I729 Aneurysm of unspecified site: Secondary | ICD-10-CM | POA: Diagnosis not present

## 2020-01-10 DIAGNOSIS — Z79899 Other long term (current) drug therapy: Secondary | ICD-10-CM | POA: Diagnosis not present

## 2020-01-11 ENCOUNTER — Encounter (HOSPITAL_COMMUNITY): Payer: Self-pay | Admitting: *Deleted

## 2020-01-11 ENCOUNTER — Emergency Department (HOSPITAL_COMMUNITY)
Admission: EM | Admit: 2020-01-11 | Discharge: 2020-01-12 | Disposition: A | Discharge status: Home / Self Care | Payer: Medicare PPO | Attending: Emergency Medicine | Admitting: Emergency Medicine

## 2020-01-11 ENCOUNTER — Other Ambulatory Visit: Payer: Self-pay

## 2020-01-11 ENCOUNTER — Emergency Department (HOSPITAL_COMMUNITY): Payer: Medicare PPO

## 2020-01-11 DIAGNOSIS — R079 Chest pain, unspecified: Secondary | ICD-10-CM | POA: Diagnosis not present

## 2020-01-11 DIAGNOSIS — E119 Type 2 diabetes mellitus without complications: Secondary | ICD-10-CM | POA: Diagnosis not present

## 2020-01-11 DIAGNOSIS — Z79899 Other long term (current) drug therapy: Secondary | ICD-10-CM | POA: Insufficient documentation

## 2020-01-11 DIAGNOSIS — M79602 Pain in left arm: Secondary | ICD-10-CM | POA: Insufficient documentation

## 2020-01-11 DIAGNOSIS — I10 Essential (primary) hypertension: Secondary | ICD-10-CM | POA: Diagnosis not present

## 2020-01-11 DIAGNOSIS — R0789 Other chest pain: Secondary | ICD-10-CM | POA: Diagnosis not present

## 2020-01-11 DIAGNOSIS — Z87891 Personal history of nicotine dependence: Secondary | ICD-10-CM | POA: Insufficient documentation

## 2020-01-11 LAB — BASIC METABOLIC PANEL
Anion gap: 11 (ref 5–15)
BUN/Creatinine Ratio: 13 (ref 10–24)
BUN: 16 mg/dL (ref 8–27)
BUN: 17 mg/dL (ref 8–23)
CO2: 25 mmol/L (ref 20–29)
CO2: 28 mmol/L (ref 22–32)
Calcium: 10.2 mg/dL (ref 8.6–10.2)
Calcium: 9.6 mg/dL (ref 8.9–10.3)
Chloride: 106 mmol/L (ref 96–106)
Chloride: 101 mmol/L (ref 98–111)
Creatinine, Ser: 1.25 mg/dL (ref 0.76–1.27)
Creatinine, Ser: 1.13 mg/dL (ref 0.61–1.24)
GFR calc Af Amer: 68 mL/min/{1.73_m2} (ref >59)
GFR calc Af Amer: >60 mL/min (ref >60)
GFR calc non Af Amer: 59 mL/min/{1.73_m2} — ABNORMAL LOW (ref >59)
GFR calc non Af Amer: >60 mL/min (ref >60)
Glucose, Bld: 133 mg/dL — ABNORMAL HIGH (ref 70–99)
Glucose: 113 mg/dL — ABNORMAL HIGH (ref 65–99)
Potassium: 4.5 mmol/L (ref 3.5–5.2)
Potassium: 4 mmol/L (ref 3.5–5.1)
Sodium: 145 mmol/L — ABNORMAL HIGH (ref 134–144)
Sodium: 140 mmol/L (ref 135–145)

## 2020-01-11 LAB — MAGNESIUM: Magnesium: 2 mg/dL (ref 1.6–2.3)

## 2020-01-11 LAB — CBC
HCT: 40 % (ref 39.0–52.0)
Hemoglobin: 12.6 g/dL — ABNORMAL LOW (ref 13.0–17.0)
MCH: 25 pg — ABNORMAL LOW (ref 26.0–34.0)
MCHC: 31.5 g/dL (ref 30.0–36.0)
MCV: 79.5 fL — ABNORMAL LOW (ref 80.0–100.0)
Platelets: 224 10*3/uL (ref 150–400)
RBC: 5.03 MIL/uL (ref 4.22–5.81)
RDW: 14.7 % (ref 11.5–15.5)
WBC: 5.4 10*3/uL (ref 4.0–10.5)
nRBC: 0 % (ref 0.0–0.2)

## 2020-01-11 LAB — TROPONIN I (HIGH SENSITIVITY)
Troponin I (High Sensitivity): 5 ng/L (ref <18)
Troponin I (High Sensitivity): 6 ng/L (ref <18)

## 2020-01-11 NOTE — ED Provider Notes (Signed)
Patient placed in Quick Look pathway, seen and evaluated   Chief Complaint: CP, anxiety   HPI:   CP started around 4 pm, feeling anxiety in urgent care. Took 3 nitros, did not help as well is it usually does. Follows with Dr. Odis Hollingshead. No history of a-fib.Endorses CP at rest.   ROS: CP, SOB, anxiety, left arm tingling and tenderness  Physical Exam:   Gen: No distress  Neuro: Awake and Alert  Skin: Warm    Focused Exam: Slight increased WOB, anxious appearing, non diaphoretic. Rate irregular   Initiation of care has begun. The patient has been counseled on the process, plan, and necessity for staying for the completion/evaluation, and the remainder of the medical screening examination    Leone Brand 01/11/20 1831    Charlynne Pander, MD 01/11/20 2238

## 2020-01-11 NOTE — ED Triage Notes (Signed)
To ED for eval of cp that started suddenly around 4pm today. Pt states he recently decreased his Flecanide dose and was to see the cardiologist tomorrow and planned to tell him then. Pt appears in nad. No nausea or vomiting. Complains of feeling light headed.

## 2020-01-12 ENCOUNTER — Ambulatory Visit: Payer: Medicare PPO | Admitting: Cardiology

## 2020-01-12 LAB — TROPONIN I (HIGH SENSITIVITY): Troponin I (High Sensitivity): 6 ng/L (ref <18)

## 2020-01-12 LAB — D-DIMER, QUANTITATIVE: D-Dimer, Quant: <0.27 ug/mL-FEU (ref 0.00–0.50)

## 2020-01-12 MED ORDER — LORAZEPAM 1 MG PO TABS
1.0000 mg | ORAL_TABLET | Freq: Once | ORAL | Status: AC
  Administered 2020-01-12: 1 mg via ORAL
  Filled 2020-01-12: qty 1

## 2020-01-12 NOTE — ED Provider Notes (Signed)
[facility] EMERGENCY DEPARTMENT Provider Note   CSN: [id number] Arrival date & time: 01/11/20  1817     History Chief Complaint  Patient presents with  . Chest Pain    Darryl [name] is a 69 y.o. male.  HPI     This is a 69 year old male with a history of hypertension, hyperlipidemia, diabetes, DVT, angina who presents with chest pain.  Patient reports onset of symptoms yesterday afternoon at 4 PM.  He states he was sitting down.  Symptoms are nonexertional.  He describes pressure-like left-sided chest pain that radiates into his arms.  He reports shortness of breath with exertion.  No diaphoresis.  Pain has been constant and nothing has seemed to make it better.  He took nitroglycerin with minimal relief.  Currently he rates his pain at 4 out of 10.  No recent fevers or cough.  He has a history of similar symptoms in the past.  Reports that he has a cardiology appointment this morning at 9 AM.  Chart reviewed.  Primary cardiologist Dr. Odis Hollingshead.  He had a cardiac catheterization in March 2021 which showed no significant coronary artery disease.  Past Medical History:  Diagnosis Date  . Acute deep vein thrombosis (DVT) of left femoral vein (HCC) 07/01/2015  . Anxiety   . Diabetes mellitus without complication (HCC)   . Epilepsy (HCC)   . Erectile dysfunction   . Hyperlipidemia   . Hypertension   . Seizures [facility])     Patient Active Problem List   Diagnosis Date Noted  . Other spondylosis with radiculopathy, cervical region 08/15/2019  . Unstable angina (HCC) 08/04/2019  . Chest pain 08/04/2019  . Bradycardia 06/18/2019  . Dizziness 06/18/2019  . Trigger thumb, right thumb 06/08/2019  . Impingement syndrome of left shoulder 06/08/2019  . Chronic left shoulder pain 07/20/2018  . Subungual hematoma of foot, left, initial encounter 07/20/2018  . 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 Falls   . Acute  deep vein thrombosis (DVT) of left femoral vein (HCC) 07/01/2015  . Pain   . Poor fluid intake   . Essential hypertension   . Type 2 diabetes mellitus with complication, without long-term current use of insulin (HCC)   . Disorientation   . Convulsions (HCC)   . Acute blood loss anemia   . Hypokalemia   . Ileus (HCC)   . Leukocytosis   . Right knee pain   . Cognitive safety issue   . Impulsiveness   . PVC (premature ventricular contraction)   . Abdominal discomfort   . SAH (subarachnoid hemorrhage) (HCC) 06/11/2015  . Emesis   . Subarachnoid hemorrhage (HCC) 06/10/2015  . Diabetes mellitus with coincident hypertension (HCC)   . Diabetes mellitus without complication [facility])     Past Surgical History:  Procedure Laterality Date  . CHOLECYSTECTOMY    . IR GENERIC HISTORICAL  05/16/2016   IR ANGIO VERTEBRAL SEL VERTEBRAL UNI L MOD SED 05/16/2016 [facility]  . IR GENERIC HISTORICAL  05/16/2016   IR ANGIOGRAM EXTREMITY RIGHT 05/16/2016 [facility]  . IR GENERIC HISTORICAL  05/16/2016   IR ANGIO INTRA EXTRACRAN SEL COM CAROTID INNOMINATE UNI R MOD SED 05/16/2016 [facility]  . KNEE SURGERY    . LEFT HEART CATH AND CORONARY ANGIOGRAPHY N/A 08/04/2019   Procedure: LEFT HEART CATH AND CORONARY ANGIOGRAPHY;  Surgeon: Elder Negus, MD;  [facility];  Service: Cardiovascular;  Laterality: N/A;  . RADIOLOGY WITH  ANESTHESIA N/A 06/11/2015   Procedure: RADIOLOGY WITH ANESTHESIA;  Surgeon: Lisbeth RenshawNeelesh Nundkumar, MD;  [facility];  Service: Radiology;  Laterality: N/A;       Family History  Problem Relation Age of Onset  . Alzheimer's disease Mother   . Diabetes Father   . Aneurysm Brother   . Aneurysm Sister     Social History   Tobacco Use  . Smoking status: Former Smoker    Packs/day: 2.00    Years: 15.00    Pack years: 30.00    Types: Cigarettes    Quit date: 06/02/1988    Years since quitting: 31.6  . Smokeless tobacco: Never Used  Vaping Use  .  Vaping Use: Never used  Substance Use Topics  . Alcohol use: No  . Drug use: No    Home Medications Prior to Admission medications   Medication Sig Start Date End Date Taking? Authorizing Provider  aspirin EC 81 MG tablet Take 81 mg by mouth daily.   Yes [provider]  atorvastatin (LIPITOR) 20 MG tablet Take 20 mg by mouth daily.   Yes [provider]  FeFum-FePoly-FA-B Cmp-C-Biot (FOLIVANE-PLUS) CAPS Take 1 capsule by mouth 2 (two) times daily. 06/17/19  Yes [provider]  flecainide (TAMBOCOR) 100 MG tablet Take 1 tablet (100 mg total) by mouth 2 (two) times daily. 11/29/19  Yes Tolia, Sunit, DO  KLOR-CON M20 20 MEQ tablet TAKE 1 TABLET BY MOUTH AT BEDTIME Patient taking differently: Take 20 mEq by mouth at bedtime.  10/22/15  Yes Ranelle OysterSwartz, Zachary T, MD  lamoTRIgine (LAMICTAL) 100 MG tablet Take 100 mg by mouth 2 (two) times daily. Take with Lamotrigine 25 mg to equal 125 mg   Yes [provider]  lamoTRIgine (LAMICTAL) 25 MG tablet Take 25 mg by mouth 2 (two) times daily.   Yes [provider]  tadalafil (CIALIS) 5 MG tablet Take 5 mg by mouth daily.    Yes [provider]  telmisartan-hydrochlorothiazide (MICARDIS HCT) 40-12.5 MG tablet Take 1 tablet by mouth daily. 06/30/19  Yes [provider]  Vitamin D, Ergocalciferol, (DRISDOL) 50000 UNITS CAPS capsule Take 50,000 Units by mouth every Sunday.  05/05/15  Yes [provider]  nitroGLYCERIN (NITROSTAT) 0.4 MG SL tablet Place 1 tablet (0.4 mg total) under the tongue every 5 (five) minutes as needed for chest pain. If you require more than two tablets 5 mins apart call 911. 08/30/19 11/28/19  Tessa Lernerolia, Sunit, DO    Allergies    Patient has no known allergies.  Review of Systems   Review of Systems  Constitutional: Negative for fever.  Respiratory: Positive for shortness of breath.   Cardiovascular: Positive for chest pain. Negative for leg swelling.    Gastrointestinal: Negative for abdominal pain, nausea and vomiting.  Genitourinary: Negative for dysuria.  All other systems reviewed and are negative.   Physical Exam Updated Vital Signs BP 137/84   Pulse 60   Temp 97.7 F (36.5 C) (Oral)   Resp 17   Ht 1.803 m (5\' 11" )   Wt 73.9 kg   SpO2 98%   BMI 22.73 kg/m   Physical Exam Vitals and nursing note reviewed.  Constitutional:      Appearance: He is well-developed.     Comments: Anxious appearing but nontoxic  HENT:     Head: Normocephalic and atraumatic.  Eyes:     Pupils: Pupils are equal, round, and reactive to light.  Cardiovascular:     Rate and  Rhythm: Normal rate and regular rhythm.     Heart sounds: Normal heart sounds. No murmur heard.   Pulmonary:     Effort: Pulmonary effort is normal. No respiratory distress.     Breath sounds: Normal breath sounds. No wheezing.  Abdominal:     General: Bowel sounds are normal.     Palpations: Abdomen is soft.     Tenderness: There is no abdominal tenderness. There is no rebound.  Musculoskeletal:     Cervical back: Neck supple.     Right lower leg: No tenderness. No edema.     Left lower leg: No tenderness. No edema.  Lymphadenopathy:     Cervical: No cervical adenopathy.  Skin:    General: Skin is warm and dry.  Neurological:     Mental Status: He is alert and oriented to person, place, and time.  Psychiatric:        Mood and Affect: Mood normal.     ED Results / Procedures / Treatments   Labs (all labs ordered are listed, but only abnormal results are displayed) Labs Reviewed  BASIC METABOLIC PANEL - Abnormal; Notable for the following components:      Result Value   Glucose, Bld 133 (*)    All other components within normal limits  CBC - Abnormal; Notable for the following components:   Hemoglobin 12.6 (*)    MCV 79.5 (*)    MCH 25.0 (*)    All other components within normal limits  D-DIMER, QUANTITATIVE (NOT AT [facility])  TROPONIN I (HIGH SENSITIVITY)   TROPONIN I (HIGH SENSITIVITY)  TROPONIN I (HIGH SENSITIVITY)  TROPONIN I (HIGH SENSITIVITY)    EKG EKG Interpretation  Date/Time:  Thursday January 12 2020 00:39:36 EDT Ventricular Rate:  56 PR Interval:  180 QRS Duration: 108 QT Interval:  440 QTC Calculation: 424 R Axis:   12 Text Interpretation: Sinus bradycardia Minimal voltage criteria for LVH, may be normal variant ( R in aVL ) Borderline ECG Confirmed by Ross Marcus ([id number]) on 01/12/2020 5:28:00 AM   Radiology DG Chest 2 View  Result Date: 01/11/2020 CLINICAL DATA:  Chest pain EXAM: CHEST - 2 VIEW COMPARISON:  08/03/2019 FINDINGS: The heart size and mediastinal contours are within normal limits. Both lungs are clear. The visualized skeletal structures are unremarkable. IMPRESSION: No active cardiopulmonary disease. Electronically Signed   By: Marlan Palau M.D.   On: 01/11/2020 19:05    Procedures Procedures (including critical care time)  Medications Ordered in ED Medications  LORazepam (ATIVAN) tablet 1 mg (1 mg Oral Given 01/12/20 5638)    ED Course  I have reviewed the triage vital signs and the nursing notes.  Pertinent labs & imaging results that were available during my care of the patient were reviewed by me and considered in my medical decision making (see chart for details).  Clinical Course as of Jan 12 735  Thu Jan 12, 2020  0601 Spoke with Dr. Jacinto Halim, on-call for [facility] cardiovascular.  Agrees with plan for repeat one troponin and have him follow-up in the office later this morning.   [CH]    Clinical Course User Index [CH] Alson Mcpheeters, Mayer Masker, MD   MDM Rules/Calculators/A&P                           Patient presents with chest pain.  Ongoing since 4:00 yesterday.  He had a recent cardiac catheterization which was largely reassuring.  He is followed by  cardiology for frequent PVCs and angina.  He is overall nontoxic and vital signs are reassuring.  EKG shows no acute ischemic changes or  arrhythmia.  Troponin x2 - from triage.  Chest x-ray without evidence of pneumothorax or pneumonia.  He is low risk for PE.  Will obtain a D-dimer to screen for PE.  Will obtain 1 additional troponin given that the patient has been in the waiting room for greater than 11 hours.  I did discuss the patient with Dr. Jacinto Halim.  He has close follow-up later this morning.  If repeat troponin negative, given recent reassuring cath report, he can follow-up in office.  Final Clinical Impression(s) / ED Diagnoses Final diagnoses:  Atypical chest pain    Rx / DC Orders ED Discharge Orders    None       Shon Baton, MD 01/12/20 [phone]

## 2020-01-12 NOTE — ED Notes (Signed)
Pt discharge instructions reviewed with the patient. The patient verbalized understanding of instructions. Pt discharged. 

## 2020-01-12 NOTE — Discharge Instructions (Addendum)
You were seen today for chest pain.  Follow-up closely with cardiology later this morning as scheduled.

## 2020-01-12 NOTE — ED Notes (Signed)
Sort NT was checking this pt's VS while in lobby and noticed that HR was 31; repeat EKG performed with HR of 56. Pt remains sinus brady. Per pt, this has happened before. Denies worsening CP. Will continue to monitor.

## 2020-01-18 ENCOUNTER — Encounter: Payer: Self-pay | Admitting: Cardiology

## 2020-01-18 ENCOUNTER — Other Ambulatory Visit: Payer: Self-pay

## 2020-01-18 ENCOUNTER — Ambulatory Visit: Payer: Medicare PPO | Admitting: Cardiology

## 2020-01-18 VITALS — BP 128/67 | HR 61 | Resp 17 | Ht 71.0 in | Wt 163.0 lb

## 2020-01-18 DIAGNOSIS — Z09 Encounter for follow-up examination after completed treatment for conditions other than malignant neoplasm: Secondary | ICD-10-CM

## 2020-01-18 DIAGNOSIS — I10 Essential (primary) hypertension: Secondary | ICD-10-CM

## 2020-01-18 DIAGNOSIS — I493 Ventricular premature depolarization: Secondary | ICD-10-CM

## 2020-01-18 DIAGNOSIS — Z79899 Other long term (current) drug therapy: Secondary | ICD-10-CM

## 2020-01-18 DIAGNOSIS — E782 Mixed hyperlipidemia: Secondary | ICD-10-CM

## 2020-01-18 DIAGNOSIS — Z87891 Personal history of nicotine dependence: Secondary | ICD-10-CM | POA: Diagnosis not present

## 2020-01-18 MED ORDER — FLECAINIDE ACETATE 50 MG PO TABS: 50.0000 mg | ORAL_TABLET | Freq: Two times a day (BID) | ORAL | 0 refills | Status: DC

## 2020-01-18 NOTE — Progress Notes (Signed)
Chief Complaint  Patient presents with  . Follow-up    2 month  . PVC management  . ER visit   Date: 01/18/20 Last Office Visit: 11/29/2019  HPI  Darryl [name] is a 69 y.o. male who presents to the office with a chief complaint of " PVC management and recent ER visit." Patient's past medical history and cardiac risk factors include: Diet-controlled diabetes mellitus type 2, hyperlipidemia, hypertension, advanced age.   Patient presents to the office for follow-up for PVC management.  At last office visit we increased his flecainide to 100 mg p.o. twice daily.  Patient stated he tolerated the medication well initially but started having more palpitations as a result started taking it once a day.  He was doing well with once a day dosing of flecainide until recently he had discomfort in his chest and went to ER for further evaluation and management.  Based on the ER notes patient was diagnosed with atypical chest pain cardiac biomarkers were negative x3.  His D-dimer is also within normal limits.  Patient was discharged home and asked to follow-up as outpatient.  Since discharge patient has not had chest pain to that severity.  He does have occasional atypical discomfort in his chest that continues.  Patient has had extensive cardiac evaluation in the recent 6 months including echocardiogram and left heart catheterization.  Results of the echocardiogram were reviewed with him in great detail at today's visit and I also reviewed the left heart cath images with him for reassurance.  ALLERGIES: No Known Allergies  MEDICATION LIST PRIOR TO VISIT: Current Outpatient Medications on File Prior to Visit  Medication Sig Dispense Refill  . aspirin EC 81 MG tablet Take 81 mg by mouth daily.    Marland Kitchen. atorvastatin (LIPITOR) 20 MG tablet Take 20 mg by mouth daily.    Marland Kitchen. FeFum-FePoly-FA-B Cmp-C-Biot (FOLIVANE-PLUS) CAPS Take 1 capsule by mouth 2 (two) times daily.    Marland Kitchen. KLOR-CON M20 20 MEQ tablet TAKE 1  TABLET BY MOUTH AT BEDTIME (Patient taking differently: Take 20 mEq by mouth at bedtime. ) 30 tablet 2  . lamoTRIgine (LAMICTAL) 100 MG tablet Take 100 mg by mouth 2 (two) times daily. Take with Lamotrigine 25 mg to equal 125 mg    . lamoTRIgine (LAMICTAL) 25 MG tablet Take 25 mg by mouth 2 (two) times daily.    . nitroGLYCERIN (NITROSTAT) 0.4 MG SL tablet Place 1 tablet (0.4 mg total) under the tongue every 5 (five) minutes as needed for chest pain. If you require more than two tablets 5 mins apart call 911. 100 tablet 1  . tadalafil (CIALIS) 5 MG tablet Take 5 mg by mouth daily.     Marland Kitchen. telmisartan-hydrochlorothiazide (MICARDIS HCT) 40-12.5 MG tablet Take 1 tablet by mouth daily.    . Vitamin D, Ergocalciferol, (DRISDOL) 50000 UNITS CAPS capsule Take 50,000 Units by mouth every Sunday.   5   No current facility-administered medications on file prior to visit.    PAST MEDICAL HISTORY: Past Medical History:  Diagnosis Date  . Acute deep vein thrombosis (DVT) of left femoral vein (HCC) 07/01/2015  . Anxiety   . Diabetes mellitus without complication (HCC)   . Epilepsy (HCC)   . Erectile dysfunction   . Hyperlipidemia   . Hypertension   . Seizures (HCC)     PAST SURGICAL HISTORY: Past Surgical History:  Procedure Laterality Date  . CHOLECYSTECTOMY    . IR GENERIC HISTORICAL  05/16/2016   IR ANGIO  VERTEBRAL SEL VERTEBRAL UNI L MOD SED 05/16/2016 [facility]  . IR GENERIC HISTORICAL  05/16/2016   IR ANGIOGRAM EXTREMITY RIGHT 05/16/2016 [facility]  . IR GENERIC HISTORICAL  05/16/2016   IR ANGIO INTRA EXTRACRAN SEL COM CAROTID INNOMINATE UNI R MOD SED 05/16/2016 [facility]  . KNEE SURGERY    . LEFT HEART CATH AND CORONARY ANGIOGRAPHY N/A 08/04/2019   Procedure: LEFT HEART CATH AND CORONARY ANGIOGRAPHY;  Surgeon: Elder Negus, MD;  Location: [facility];  Service: Cardiovascular;  Laterality: N/A;  . RADIOLOGY WITH ANESTHESIA N/A 06/11/2015   Procedure: RADIOLOGY WITH  ANESTHESIA;  Surgeon: Lisbeth Renshaw, MD;  Location: [facility] OR;  Service: Radiology;  Laterality: N/A;    FAMILY HISTORY: The patient family history includes Alzheimer's disease in his mother; Aneurysm in his brother and sister; Diabetes in his father.   SOCIAL HISTORY:  The patient  reports that he quit smoking about 31 years ago. His smoking use included cigarettes. He has a 30.00 pack-year smoking history. He has never used smokeless tobacco. He reports that he does not drink alcohol and does not use drugs.  Review of Systems  Constitutional: Negative for chills and fever.  HENT: Negative for hoarse voice and nosebleeds.   Eyes: Negative for discharge, double vision and pain.  Cardiovascular: Negative for chest pain, claudication, dyspnea on exertion, leg swelling, near-syncope, orthopnea, palpitations, paroxysmal nocturnal dyspnea and syncope.  Respiratory: Negative for hemoptysis and shortness of breath.   Musculoskeletal: Negative for muscle cramps and myalgias.  Gastrointestinal: Negative for abdominal pain, constipation, diarrhea, hematemesis, hematochezia, melena, nausea and vomiting.  Neurological: Negative for dizziness and light-headedness.     PHYSICAL EXAM: Vitals with BMI 01/18/2020 01/12/2020 01/12/2020  Height 5\' 11"  - -  Weight 163 lbs - -  BMI 22.74 - -  Systolic 128 111  Diastolic 67 55 84  Pulse 61 70 -   CONSTITUTIONAL: Well-developed and well-nourished. No acute distress.  SKIN: Skin is warm and dry. No rash noted. No cyanosis. No pallor. No jaundice HEAD: Normocephalic and atraumatic.  EYES: No scleral icterus MOUTH/THROAT: Moist oral membranes.  NECK: No JVD present. No thyromegaly noted. No carotid bruits  LYMPHATIC: No visible cervical adenopathy.  CHEST Normal respiratory effort. No intercostal retractions  LUNGS: Clear to auscultation bilaterally.  No stridor. No wheezes. No rales.  CARDIOVASCULAR: Bradycardia, regular, positive S1-S2, no murmurs  rubs or gallops appreciated ABDOMINAL: Nonobese, soft, nontender, nondistended positive bowel sounds.  No apparent ascites.  EXTREMITIES: No peripheral edema  HEMATOLOGIC: No significant bruising NEUROLOGIC: Oriented to person, place, and time. Nonfocal. Normal muscle tone.  PSYCHIATRIC: Normal mood and affect. Normal behavior. Cooperative  CARDIAC DATABASE: EKG: 08/03/2019: Sinus bradycardia with ventricular rate of 54 bpm, poor R wave progression, without underlying new injury pattern. 11/29/2019: Sinus rhythm borderline, frequent PVCs and bigeminal pattern.  Echocardiogram: March 2021:LVEF 65 to 70%, no regional wall motion abnormalities, normal diastolic function, mildly dilated left atrium, mildly dilated right atrium, no significant valvular abnormality.   Stress Testing:  None  Heart Catheterization: 08/04/2019: Right dominant circulation with tortuous, small-medium caliber vessels. TIMI III flow. No angiographically significant coronary artery disease. Normal LVEDP. Normal LVEF.  MCT 06/27/2019-07/26/2019: Minimum heart rate was noted to be 46 bpm on June 30, 2019 at 1:37 PM. Maximum heart rate was noted to be 120 bpm on July 14, 2019 at 6:19 PM. Average heart rate 70 bpm. No atrial fibrillation detected during the monitoring period. Ventricular ectopic burden reported to  be 16%. Patient triggered events illustrate underlying sinus mechanism and at times ventricular ectopy.  Please refer to the official report for more details.  LABORATORY DATA: CBC Latest Ref Rng & Units 01/11/2020 08/03/2019 07/14/2017  WBC 4.0 - 10.5 K/uL 5.4 6.3 -  Hemoglobin 13.0 - 17.0 g/dL 12.6(L) 14.1 17.0  Hematocrit 39 - 52 % 40.0 43.4 50.0  Platelets 150 - 400 K/uL 224 217 -    CMP Latest Ref Rng & Units 01/11/2020 01/10/2020 11/18/2019  Glucose 70 - 99 mg/dL 782(N) 562(Z) 308(M)  BUN 8 - 23 mg/dL 17 16 16   Creatinine 0.61 - 1.24 mg/dL 5.78 4.69  Sodium 135 - 145 mmol/L 140 145(H) 147(H)   Potassium 3.5 - 5.1 mmol/L 4.0 4.5 4.8  Chloride 98 - 111 mmol/L 101 106 106  CO2 22 - 32 mmol/L 28 25 26   Calcium 8.9 - 10.3 mg/dL 9.6 6.29  Total Protein 6.5 - 8.1 g/dL - - -  Total Bilirubin 0.3 - 1.2 mg/dL - - -  Alkaline Phos 38 - 126 U/L - - -  AST 15 - 41 U/L - - -  ALT 17 - 63 U/L - - -    External Labs: Collected: 06/13/2018 Lipid profile: Total cholesterol 220, triglycerides 69, HDL 72, LDL 132 Hemoglobin A1c: 6.5  12/31/2018 TSH: 1.63  07/2017 serum creatinine 1.4 mg/dL.  FINAL MEDICATION LIST END OF ENCOUNTER: Meds ordered this encounter  Medications  . flecainide (TAMBOCOR) 50 MG tablet    Sig: Take 1 tablet (50 mg total) by mouth 2 (two) times daily.    Dispense:  180 tablet    Refill:  0    Medications Discontinued During This Encounter  Medication Reason  . flecainide (TAMBOCOR) 100 MG tablet Dose change     Current Outpatient Medications:  .  aspirin EC 81 MG tablet, Take 81 mg by mouth daily., Disp: , Rfl:  .  atorvastatin (LIPITOR) 20 MG tablet, Take 20 mg by mouth daily., Disp: , Rfl:  .  FeFum-FePoly-FA-B Cmp-C-Biot (FOLIVANE-PLUS) CAPS, Take 1 capsule by mouth 2 (two) times daily., Disp: , Rfl:  .  KLOR-CON M20 20 MEQ tablet, TAKE 1 TABLET BY MOUTH AT BEDTIME (Patient taking differently: Take 20 mEq by mouth at bedtime. ), Disp: 30 tablet, Rfl: 2 .  lamoTRIgine (LAMICTAL) 100 MG tablet, Take 100 mg by mouth 2 (two) times daily. Take with Lamotrigine 25 mg to equal 125 mg, Disp: , Rfl:  .  lamoTRIgine (LAMICTAL) 25 MG tablet, Take 25 mg by mouth 2 (two) times daily., Disp: , Rfl:  .  nitroGLYCERIN (NITROSTAT) 0.4 MG SL tablet, Place 1 tablet (0.4 mg total) under the tongue every 5 (five) minutes as needed for chest pain. If you require more than two tablets 5 mins apart call 911., Disp: 100 tablet, Rfl: 1 .  tadalafil (CIALIS) 5 MG tablet, Take 5 mg by mouth daily. , Disp: , Rfl:  .  telmisartan-hydrochlorothiazide (MICARDIS HCT) 40-12.5 MG tablet,  Take 1 tablet by mouth daily., Disp: , Rfl:  .  Vitamin D, Ergocalciferol, (DRISDOL) 50000 UNITS CAPS capsule, Take 50,000 Units by mouth every Sunday. , Disp: , Rfl: 5 .  flecainide (TAMBOCOR) 50 MG tablet, Take 1 tablet (50 mg total) by mouth 2 (two) times daily., Disp: 180 tablet, Rfl: 0  IMPRESSION:    ICD-10-CM   1. PVC (premature ventricular contraction)  I49.3 flecainide (TAMBOCOR) 50 MG tablet  2. Long term current use of antiarrhythmic drug  Z79.899   3. Essential hypertension  I10   4. Former smoker  Z87.891   5. Mixed hyperlipidemia  E78.2   6. Hospital discharge follow-up  Z09      RECOMMENDATIONS: Darryl [name] is a 69 y.o. male whose past medical history and cardiac risk factors include: Diet-controlled diabetes mellitus type 2, hyperlipidemia, hypertension, advanced age.   Bradycardia: Resolved  Clinically patient did improve after initiation of flecainide as he had underlying ventricular ectopy noted on mobile cardiac ambulatory telemetry performed back in January 2021.    However, patient did not tolerate the up titration of flecainide 100 mg p.o. twice daily.    We will decrease flecainide to 50 mg p.o. twice daily.  Most recent lab work reviewed kidney function and potassium within normal limits.    Patient also notes that his overall functional capacity is better and he is less tired and fatigued compared to before.  Hospital follow-up:  Patient recently went to the ER for evaluation of chest discomfort.  Patient is EKG did not show ST segment elevation MI.  High sensitive troponins were negative x3.  Reviewed the left heart catheterization films with the patient at today's office visit for reassurance.  Most recent echocardiogram also noted preserved LVEF without any significant valvular heart disease.  Recommend monitoring for now.  Mixed hyperlipidemia:  Continue Lipitor 20 mg p.o. nightly.  Patient tolerated the medication well without any side  effects or intolerances.   Benign essential hypertension: . Medication reconciled.  . Low salt diet recommended. A diet that is rich in fruits, vegetables, legumes, and low-fat dairy products and low in snacks, sweets, and meats (such as the Dietary Approaches to Stop Hypertension [DASH] diet).   Non-insulin-dependent diabetes mellitus type 2, diet controlled: Management per primary care team.  Independently reviewed recent ER visit notes, lab work, reviewed the heart catheterization angiograms with the patient.  Total time spent: 30 minutes.  No orders of the defined types were placed in this encounter.  --Continue cardiac medications as reconciled in final medication list. --Return in about 6 months (around 07/20/2020) for PVC burden and EKG on arrival. .. Or sooner if needed. --Continue follow-up with your primary care physician regarding the management of your other chronic comorbid conditions.  Patient's questions and concerns were addressed to his satisfaction. He voices understanding of the instructions provided during this encounter.   This note was created using a voice recognition software as a result there may be grammatical errors inadvertently enclosed that do not reflect the nature of this encounter. Every attempt is made to correct such errors.  Tessa Lerner, [place], [facility]  Pager: [phone] Office: [phone]

## 2020-01-23 DIAGNOSIS — N401 Enlarged prostate with lower urinary tract symptoms: Secondary | ICD-10-CM | POA: Diagnosis not present

## 2020-01-23 DIAGNOSIS — N5201 Erectile dysfunction due to arterial insufficiency: Secondary | ICD-10-CM | POA: Diagnosis not present

## 2020-01-23 DIAGNOSIS — R3915 Urgency of urination: Secondary | ICD-10-CM | POA: Diagnosis not present

## 2020-01-23 DIAGNOSIS — R300 Dysuria: Secondary | ICD-10-CM | POA: Diagnosis not present

## 2020-02-02 ENCOUNTER — Other Ambulatory Visit: Payer: Self-pay | Admitting: Cardiology

## 2020-02-02 DIAGNOSIS — I493 Ventricular premature depolarization: Secondary | ICD-10-CM

## 2020-02-23 DIAGNOSIS — H40023 Open angle with borderline findings, high risk, bilateral: Secondary | ICD-10-CM | POA: Diagnosis not present

## 2020-03-15 DIAGNOSIS — R413 Other amnesia: Secondary | ICD-10-CM | POA: Diagnosis not present

## 2020-03-15 DIAGNOSIS — Z0000 Encounter for general adult medical examination without abnormal findings: Secondary | ICD-10-CM | POA: Diagnosis not present

## 2020-03-15 DIAGNOSIS — I10 Essential (primary) hypertension: Secondary | ICD-10-CM | POA: Diagnosis not present

## 2020-03-29 DIAGNOSIS — R350 Frequency of micturition: Secondary | ICD-10-CM | POA: Diagnosis not present

## 2020-03-29 DIAGNOSIS — N5201 Erectile dysfunction due to arterial insufficiency: Secondary | ICD-10-CM | POA: Diagnosis not present

## 2020-03-29 DIAGNOSIS — N401 Enlarged prostate with lower urinary tract symptoms: Secondary | ICD-10-CM | POA: Diagnosis not present

## 2020-03-29 DIAGNOSIS — R3912 Poor urinary stream: Secondary | ICD-10-CM | POA: Diagnosis not present

## 2020-06-06 ENCOUNTER — Other Ambulatory Visit: Payer: Self-pay | Admitting: Cardiology

## 2020-06-06 DIAGNOSIS — N5201 Erectile dysfunction due to arterial insufficiency: Secondary | ICD-10-CM | POA: Diagnosis not present

## 2020-06-06 DIAGNOSIS — R3915 Urgency of urination: Secondary | ICD-10-CM | POA: Diagnosis not present

## 2020-06-06 DIAGNOSIS — I209 Angina pectoris, unspecified: Secondary | ICD-10-CM

## 2020-06-06 DIAGNOSIS — N401 Enlarged prostate with lower urinary tract symptoms: Secondary | ICD-10-CM | POA: Diagnosis not present

## 2020-06-06 DIAGNOSIS — E119 Type 2 diabetes mellitus without complications: Secondary | ICD-10-CM

## 2020-07-20 ENCOUNTER — Ambulatory Visit: Payer: Medicare PPO | Admitting: Cardiology

## 2020-07-20 ENCOUNTER — Encounter: Payer: Self-pay | Admitting: Cardiology

## 2020-07-20 ENCOUNTER — Other Ambulatory Visit: Payer: Self-pay

## 2020-07-20 VITALS — BP 143/78 | HR 48 | Temp 98.0°F | Resp 16 | Ht 71.0 in | Wt 164.0 lb

## 2020-07-20 DIAGNOSIS — Z87891 Personal history of nicotine dependence: Secondary | ICD-10-CM

## 2020-07-20 DIAGNOSIS — E782 Mixed hyperlipidemia: Secondary | ICD-10-CM

## 2020-07-20 DIAGNOSIS — R001 Bradycardia, unspecified: Secondary | ICD-10-CM | POA: Diagnosis not present

## 2020-07-20 DIAGNOSIS — Z79899 Other long term (current) drug therapy: Secondary | ICD-10-CM

## 2020-07-20 DIAGNOSIS — I493 Ventricular premature depolarization: Secondary | ICD-10-CM | POA: Diagnosis not present

## 2020-07-20 DIAGNOSIS — I10 Essential (primary) hypertension: Secondary | ICD-10-CM | POA: Diagnosis not present

## 2020-07-20 NOTE — Progress Notes (Signed)
Chief Complaint  Patient presents with  . PVC (premature ventricular contraction)  . Follow-up   Date: 07/20/20 Last Office Visit: 01/18/2020  HPI  Darryl [name] is a 70 y.o. male who presents to the office with a chief complaint of " 5858-month follow-up for shortness of breath and PVC management." Patient's past medical history and cardiac risk factors include: Diet-controlled diabetes mellitus type 2, hyperlipidemia, hypertension, advanced age.   Patient presents today for 9058-month follow-up since last office visit for evaluation of PVC and shortness of breath.  Since last office visit patient states that he is being stable from a cardiovascular standpoint.  No hospitalizations or urgent care visits for cardiovascular symptoms.  He continues to ambulate half a mile per day.  At times he does notice shortness of breath with effort related activities but not always.  In the past he was noted to have a high PVC burden along with bradycardia.  He was referred to EP for further evaluation and to be considered for PVC ablation.  However at that time patient was asymptomatic and therefore was recommended to manage medically.   Per EP recommendations did initiate flecainide 100 mg p.o. twice daily.  However, patient did not tolerate flecainide 100 mg well and therefore it was reduced to 50 mg p.o. twice daily.  Otherwise patient is relatively stable.  No episodes of lightheadedness, dizziness, near syncope or syncope.  ALLERGIES: No Known Allergies  MEDICATION LIST PRIOR TO VISIT: Current Outpatient Medications on File Prior to Visit  Medication Sig Dispense Refill  . aspirin EC 81 MG tablet Take 81 mg by mouth as needed.    Marland Kitchen. atorvastatin (LIPITOR) 20 MG tablet Take 20 mg by mouth daily.    Marland Kitchen. FeFum-FePoly-FA-B Cmp-C-Biot (FOLIVANE-PLUS) CAPS Take 1 capsule by mouth 2 (two) times daily.    . flecainide (TAMBOCOR) 50 MG tablet TAKE 1 TABLET BY MOUTH TWICE A DAY 180 tablet 0  . lamoTRIgine  (LAMICTAL) 100 MG tablet Take 100 mg by mouth 2 (two) times daily. Take with Lamotrigine 25 mg to equal 125 mg    . lamoTRIgine (LAMICTAL) 25 MG tablet Take 25 mg by mouth 2 (two) times daily.    . nitroGLYCERIN (NITROSTAT) 0.4 MG SL tablet Place 1 tablet (0.4 mg total) under the tongue every 5 (five) minutes as needed for chest pain. If you require more than two tablets 5 mins apart call 911. 100 tablet 1  . tadalafil (CIALIS) 5 MG tablet Take 5 mg by mouth daily.    Marland Kitchen. telmisartan-hydrochlorothiazide (MICARDIS HCT) 40-12.5 MG tablet Take 1 tablet by mouth daily.    . Vitamin D, Ergocalciferol, (DRISDOL) 50000 UNITS CAPS capsule Take 50,000 Units by mouth every Sunday.   5   No current facility-administered medications on file prior to visit.    PAST MEDICAL HISTORY: Past Medical History:  Diagnosis Date  . Acute deep vein thrombosis (DVT) of left femoral vein (HCC) 07/01/2015  . Anxiety   . Diabetes mellitus without complication (HCC)   . Epilepsy (HCC)   . Erectile dysfunction   . Hyperlipidemia   . Hypertension   . Seizures (HCC)     PAST SURGICAL HISTORY: Past Surgical History:  Procedure Laterality Date  . CHOLECYSTECTOMY    . IR GENERIC HISTORICAL  05/16/2016   IR ANGIO VERTEBRAL SEL VERTEBRAL UNI L MOD SED 05/16/2016 [facility]  . IR GENERIC HISTORICAL  05/16/2016   IR ANGIOGRAM EXTREMITY RIGHT 05/16/2016 [facility]  . IR GENERIC HISTORICAL  05/16/2016   IR ANGIO INTRA EXTRACRAN SEL COM CAROTID INNOMINATE UNI R MOD SED 05/16/2016 [facility]  . KNEE SURGERY    . LEFT HEART CATH AND CORONARY ANGIOGRAPHY N/A 08/04/2019   Procedure: LEFT HEART CATH AND CORONARY ANGIOGRAPHY;  Surgeon: Elder Negus, MD;  Location: [facility];  Service: Cardiovascular;  Laterality: N/A;  . RADIOLOGY WITH ANESTHESIA N/A 06/11/2015   Procedure: RADIOLOGY WITH ANESTHESIA;  Surgeon: Lisbeth Renshaw, MD;  Location: [facility] OR;  Service: Radiology;  Laterality: N/A;    FAMILY  HISTORY: The patient family history includes Alzheimer's disease in his mother; Aneurysm in his brother and sister; Diabetes in his father.   SOCIAL HISTORY:  The patient  reports that he quit smoking about 32 years ago. His smoking use included cigarettes. He has a 30.00 pack-year smoking history. He has never used smokeless tobacco. He reports that he does not drink alcohol and does not use drugs.  Review of Systems  Constitutional: Negative for chills and fever.  HENT: Negative for hoarse voice and nosebleeds.   Eyes: Negative for discharge, double vision and pain.  Cardiovascular: Negative for chest pain, claudication, dyspnea on exertion, leg swelling, near-syncope, orthopnea, palpitations, paroxysmal nocturnal dyspnea and syncope.  Respiratory: Negative for hemoptysis and shortness of breath.   Musculoskeletal: Negative for muscle cramps and myalgias.  Gastrointestinal: Negative for abdominal pain, constipation, diarrhea, hematemesis, hematochezia, melena, nausea and vomiting.  Neurological: Negative for dizziness and light-headedness.     PHYSICAL EXAM: Vitals with BMI 07/20/2020 01/18/2020 01/12/2020  Height 5\' 11"  5\' 11"  -  Weight 164 lbs 163 lbs -  BMI 22.88 22.74 -  Systolic 143 128  Diastolic 78 67 55  Pulse 48 61 70   CONSTITUTIONAL: Well-developed and well-nourished. No acute distress.  SKIN: Skin is warm and dry. No rash noted. No cyanosis. No pallor. No jaundice HEAD: Normocephalic and atraumatic.  EYES: No scleral icterus MOUTH/THROAT: Moist oral membranes.  NECK: No JVD present. No thyromegaly noted. No carotid bruits  LYMPHATIC: No visible cervical adenopathy.  CHEST Normal respiratory effort. No intercostal retractions  LUNGS: Clear to auscultation bilaterally.  No stridor. No wheezes. No rales.  CARDIOVASCULAR: Bradycardia, regular, positive S1-S2, no murmurs rubs or gallops appreciated ABDOMINAL: Nonobese, soft, nontender, nondistended positive bowel  sounds.  No apparent ascites.  EXTREMITIES: No peripheral edema  HEMATOLOGIC: No significant bruising NEUROLOGIC: Oriented to person, place, and time. Nonfocal. Normal muscle tone.  PSYCHIATRIC: Normal mood and affect. Normal behavior. Cooperative  CARDIAC DATABASE: EKG: 07/20/2020: Sinus bradycardia, 48 bpm, frequent ventricular ectopic beats, without underlying injury pattern.  Echocardiogram: March 2021:LVEF 65 to 70%, no regional wall motion abnormalities, normal diastolic function, mildly dilated left atrium, mildly dilated right atrium, no significant valvular abnormality.   Stress Testing:  None  Heart Catheterization: 08/04/2019: Right dominant circulation with tortuous, small-medium caliber vessels. TIMI III flow. No angiographically significant coronary artery disease. Normal LVEDP. Normal LVEF.  MCT 06/27/2019-07/26/2019: Minimum heart rate was noted to be 46 bpm on June 30, 2019 at 1:37 PM. Maximum heart rate was noted to be 120 bpm on July 14, 2019 at 6:19 PM. Average heart rate 70 bpm. No atrial fibrillation detected during the monitoring period. Ventricular ectopic burden reported to be 16%. Patient triggered events illustrate underlying sinus mechanism and at times ventricular ectopy.  Please refer to the official report for more details.  LABORATORY DATA: CBC Latest Ref Rng & Units 01/11/2020 08/03/2019 07/14/2017  WBC 4.0 - 10.5 K/uL 5.4 6.3 -  Hemoglobin 13.0 - 17.0 g/dL 12.6(L) 14.1 17.0  Hematocrit 39.0 - 52.0 % 40.0 43.4 50.0  Platelets 150 - 400 K/uL 224 217 -    CMP Latest Ref Rng & Units 01/11/2020 01/10/2020 11/18/2019  Glucose 70 - 99 mg/dL 132(G) 401(U) 272(Z)  BUN 8 - 23 mg/dL 17 16 16   Creatinine 0.61 - 1.24 mg/dL 3.66 4.40  Sodium 135 - 145 mmol/L 140 145(H) 147(H)  Potassium 3.5 - 5.1 mmol/L 4.0 4.5 4.8  Chloride 98 - 111 mmol/L 101 106 106  CO2 22 - 32 mmol/L 28 25 26   Calcium 8.9 - 10.3 mg/dL 9.6 3.47  Total Protein 6.5 - 8.1 g/dL - -  -  Total Bilirubin 0.3 - 1.2 mg/dL - - -  Alkaline Phos 38 - 126 U/L - - -  AST 15 - 41 U/L - - -  ALT 17 - 63 U/L - - -    External Labs: Collected: 06/13/2018 Lipid profile: Total cholesterol 220, triglycerides 69, HDL 72, LDL 132 Hemoglobin A1c: 6.5  12/31/2018 TSH: 1.63  07/2017 serum creatinine 1.4 mg/dL.  FINAL MEDICATION LIST END OF ENCOUNTER: No orders of the defined types were placed in this encounter.   Medications Discontinued During This Encounter  Medication Reason  . KLOR-CON M20 20 MEQ tablet Patient Preference     Current Outpatient Medications:  .  aspirin EC 81 MG tablet, Take 81 mg by mouth as needed., Disp: , Rfl:  .  atorvastatin (LIPITOR) 20 MG tablet, Take 20 mg by mouth daily., Disp: , Rfl:  .  FeFum-FePoly-FA-B Cmp-C-Biot (FOLIVANE-PLUS) CAPS, Take 1 capsule by mouth 2 (two) times daily., Disp: , Rfl:  .  flecainide (TAMBOCOR) 50 MG tablet, TAKE 1 TABLET BY MOUTH TWICE A DAY, Disp: 180 tablet, Rfl: 0 .  lamoTRIgine (LAMICTAL) 100 MG tablet, Take 100 mg by mouth 2 (two) times daily. Take with Lamotrigine 25 mg to equal 125 mg, Disp: , Rfl:  .  lamoTRIgine (LAMICTAL) 25 MG tablet, Take 25 mg by mouth 2 (two) times daily., Disp: , Rfl:  .  nitroGLYCERIN (NITROSTAT) 0.4 MG SL tablet, Place 1 tablet (0.4 mg total) under the tongue every 5 (five) minutes as needed for chest pain. If you require more than two tablets 5 mins apart call 911., Disp: 100 tablet, Rfl: 1 .  tadalafil (CIALIS) 5 MG tablet, Take 5 mg by mouth daily., Disp: , Rfl:  .  telmisartan-hydrochlorothiazide (MICARDIS HCT) 40-12.5 MG tablet, Take 1 tablet by mouth daily., Disp: , Rfl:  .  Vitamin D, Ergocalciferol, (DRISDOL) 50000 UNITS CAPS capsule, Take 50,000 Units by mouth every Sunday. , Disp: , Rfl: 5  IMPRESSION:    ICD-10-CM   1. PVC (premature ventricular contraction)  I49.3 EKG 12-Lead  2. Long term current use of antiarrhythmic drug  Z79.899 Basic metabolic panel    Magnesium    PCV  CARDIAC STRESS TEST    SARS-COV-2 RNA,(COVID-19) QUAL NAAT  3. Essential hypertension  I10   4. Former smoker  Z87.891   5. Mixed hyperlipidemia  E78.2   6. Bradycardia  R00.1      RECOMMENDATIONS: Daquarius Dubeau is a 70 y.o. male whose past medical history and cardiac risk factors include: Diet-controlled diabetes mellitus type 2, hyperlipidemia, hypertension, advanced age.   Premature ventricular contractions: At times patient is symptomatic with effort related dyspnea. He has been evaluated by cardiac electrophysiology was recommended medical therapy until he becomes symptomatic. Currently on flecainide 50 mg p.o.  twice daily. He has not tolerated flecainide 100 mg p.o. twice daily in the past. Because he is on antiarrhythmic medications would recommend checking a BMP and magnesium to evaluate electrolytes and kidney function. Also would recommend a GXT to rule out exercise-induced arrhythmia.  Bradycardia, asymptomatic: Most likely secondary to his underlying PVC burden. Currently on flecainide See above  Mixed hyperlipidemia: Continue Lipitor 20 mg p.o. nightly. Patient tolerated the medication well without any side effects or intolerances.  Currently managed by primary care provider.  His benign essential hypertension and non-insulin-dependent diabetes mellitus type 2 which is diet controlled this currently being managed by PCP.   Orders Placed This Encounter  Procedures  . SARS-COV-2 RNA,(COVID-19) QUAL NAAT  . Basic metabolic panel  . Magnesium  . PCV CARDIAC STRESS TEST  . EKG 12-Lead   --Continue cardiac medications as reconciled in final medication list. --Return in about 6 months (around 01/17/2021) for Follow up PVCs. .. Or sooner if needed. --Continue follow-up with your primary care physician regarding the management of your other chronic comorbid conditions.  Patient's questions and concerns were addressed to his satisfaction. He voices understanding of the  instructions provided during this encounter.   This note was created using a voice recognition software as a result there may be grammatical errors inadvertently enclosed that do not reflect the nature of this encounter. Every attempt is made to correct such errors.  Tessa Lerner, [place], [facility]  Pager: [phone] Office: [phone]

## 2020-07-25 ENCOUNTER — Other Ambulatory Visit: Payer: Self-pay

## 2020-07-25 ENCOUNTER — Ambulatory Visit: Payer: Medicare PPO | Admitting: Cardiology

## 2020-07-25 ENCOUNTER — Ambulatory Visit: Payer: Medicare PPO

## 2020-07-25 VITALS — BP 104/65 | HR 53 | Resp 16 | Ht 71.0 in | Wt 164.0 lb

## 2020-07-25 DIAGNOSIS — I493 Ventricular premature depolarization: Secondary | ICD-10-CM | POA: Diagnosis not present

## 2020-07-25 DIAGNOSIS — R079 Chest pain, unspecified: Secondary | ICD-10-CM

## 2020-07-25 DIAGNOSIS — Z79899 Other long term (current) drug therapy: Secondary | ICD-10-CM | POA: Diagnosis not present

## 2020-07-25 MED ORDER — METOPROLOL TARTRATE 25 MG PO TABS: 25.0000 mg | ORAL_TABLET | Freq: Two times a day (BID) | ORAL | 1 refills | Status: DC

## 2020-07-25 NOTE — Progress Notes (Signed)
Follow up visit  Subjective:   Darryl [name], male    DOB: August 06, 1950, 70 y.o.   MRN: [id number]   HPI  Chief Complaint  Patient presents with  . 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 He did not tolerate flecainide 100 mg bid, and was thus reduced to 50 mg bid. Patient was here today for exercise treadmill stress test to assess tolerability of flecainide. During the stress test, patient developed 7/10 chest pain, reduced to 3/10 after resting. He also developed ventricular bigeminy, that persisted during recovery. Pain did not respond to SL NTG. Patient desribes the pain as left sided, sharp, focal.    Current Outpatient Medications on File Prior to Visit  Medication Sig Dispense Refill  . aspirin EC 81 MG tablet Take 81 mg by mouth as needed.    Marland Kitchen atorvastatin (LIPITOR) 20 MG tablet Take 20 mg by mouth daily.    Marland Kitchen FeFum-FePoly-FA-B Cmp-C-Biot (FOLIVANE-PLUS) CAPS Take 1 capsule by mouth 2 (two) times daily.    . flecainide (TAMBOCOR) 50 MG tablet TAKE 1 TABLET BY MOUTH TWICE A DAY 180 tablet 0  . lamoTRIgine (LAMICTAL) 100 MG tablet Take 100 mg by mouth 2 (two) times daily. Take with Lamotrigine 25 mg to equal 125 mg    . lamoTRIgine (LAMICTAL) 25 MG tablet Take 25 mg by mouth 2 (two) times daily.    . nitroGLYCERIN (NITROSTAT) 0.4 MG SL tablet Place 1 tablet (0.4 mg total) under the tongue every 5 (five) minutes as needed for chest pain. If you require more than two tablets 5 mins apart call 911. 100 tablet 1  . tadalafil (CIALIS) 5 MG tablet Take 5 mg by mouth daily.    Marland Kitchen telmisartan-hydrochlorothiazide (MICARDIS HCT) 40-12.5 MG tablet Take 1 tablet by mouth daily.    . Vitamin D,  Ergocalciferol, (DRISDOL) 50000 UNITS CAPS capsule Take 50,000 Units by mouth every Sunday.   5   No current facility-administered medications on file prior to visit.    Cardiovascular & other pertient studies:  EKG 07/25/2020: Sinus rhythm 74 bpm Ventricular bigeminy Left atrial enlargement Nonspecific T-abnormality  Cardiac event monitor 08/10/2019: 1. NSR with sinus brady and sinus tachycardia 2. Frequent PVC's and PAC's 3. No obvious atrial fib though noise artifact limits interpretation. 4. No prolonged pauses   Coronary angiography 08/04/2019: Right dominant circulation with tortuous, small-medium caliber vessels. TIMI III flow. No angiographically significant coronary artery disease. Normal LVEDP. Normal LVEF  Echocardiogram 08/04/2019: 1. Left ventricular ejection fraction, by estimation, is 65 to 70%. The  left ventricle has normal function. The left ventricle has no regional  wall motion abnormalities. Left ventricular diastolic parameters were  normal.  2. Right ventricular systolic function is normal. The right ventricular  size is normal.  3. Left atrial size was mildly dilated.  4. Right atrial size was mildly dilated. IVC not well visualized.  5. No signifciant valvular abnormality.  Recent labs: None since 01/2020   Review of Systems  Cardiovascular: Positive for chest pain. Negative for dyspnea on exertion, leg swelling, palpitations and syncope.         Vitals:   07/25/20 1613 07/25/20 1614  BP: (!) 127/59 104/65  Pulse: (!) 36 (!) 53  Resp:    SpO2:      Body mass index is 22.87 kg/m. Filed Weights   07/25/20 1612  Weight: 164 lb (74.4 kg)     Objective:   Physical Exam Vitals and nursing note reviewed.  Constitutional:      General: He is not in acute distress. Neck:     Vascular: No JVD.  Cardiovascular:     Rate and Rhythm: Normal rate and regular rhythm. Frequent extrasystoles are present.    Heart sounds: Normal heart  sounds. No murmur heard.   Pulmonary:     Effort: Pulmonary effort is normal.     Breath sounds: Normal breath sounds. No wheezing or rales.  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 Pain improved. Previously noted to have normal coronaries in 08/2019. I suspect his pain is in fact symptomatic PVC or musculoskeletal. Low suspicion for ACS. Nonetheless, patient knows to go to ER,  if chest pain were to get worse. Continue Aspirin, statin.   Symptomatic PVC: Given exercise induced ventricular bigeminy, I have stopped his flecainide. Started metoprolol tartrate 25 mg bid   F/u w/Dr Odis Hollingshead in 4 weeks    Elder Negus, MD Pager: [phone] Office: [phone]

## 2020-07-28 ENCOUNTER — Other Ambulatory Visit: Payer: Self-pay | Admitting: Cardiology

## 2020-08-21 DIAGNOSIS — H40023 Open angle with borderline findings, high risk, bilateral: Secondary | ICD-10-CM | POA: Diagnosis not present

## 2020-08-21 DIAGNOSIS — H2513 Age-related nuclear cataract, bilateral: Secondary | ICD-10-CM | POA: Diagnosis not present

## 2020-08-21 DIAGNOSIS — E119 Type 2 diabetes mellitus without complications: Secondary | ICD-10-CM | POA: Diagnosis not present

## 2020-08-22 ENCOUNTER — Other Ambulatory Visit: Payer: Self-pay | Admitting: Cardiology

## 2020-08-22 DIAGNOSIS — R079 Chest pain, unspecified: Secondary | ICD-10-CM

## 2020-08-22 DIAGNOSIS — I493 Ventricular premature depolarization: Secondary | ICD-10-CM

## 2020-08-23 NOTE — Progress Notes (Signed)
Called and spoke with patient, transferred call to front desk staff to schedule that FU appointment.

## 2020-09-03 NOTE — Progress Notes (Signed)
Date: 09/04/20 Last Office Visit: 07/20/2020  Chief Complaint  Patient presents with  . premature ventricular contraction  . Follow-up    HPI  Darryl [name] is a 70 y.o. male who presents to the office with a chief complaint of " PVC management and recent stress test." Patient's past medical history and cardiac risk factors include: Diet-controlled diabetes mellitus type 2, hyperlipidemia, hypertension, advanced age.   Patient follows at the office for management of his PVCs and shortness of breath.  Since establishing care patient has undergone extensive evaluation including echocardiogram, GXT, and left heart catheterization results noted below.  He had a telemetry with his primary care provider which noted increased PVC burden and therefore with the symptoms of being tired and fatigue he was referred to electrophysiology for further evaluation and management.  At that time patient was asymptomatic and was started on flecainide 100 mg p.o. twice daily.  Patient did not tolerate flecainide 100 mg well and therefore we reduced it to 50 mg p.o. twice daily.  At the last office visit the shared decision was to proceed with a GXT to rule out exercise-induced arrhythmia as he was on flecainide.  Patient did undergo the GXT and did not have any significant arrhythmias but his underlying PVCs were present throughout the duration of the study.  He also had chest discomfort and was started on metoprolol tartrate 25 mg p.o. twice daily.  He presents today at follow-up.  Patient states metoprolol has significantly improved his chest discomfort but he continues to feel tired, fatigued, and decreased physical endurance over the last several months.  He denies any active chest pain or heart failure symptoms.  No episodes of syncope.  ALLERGIES: No Known Allergies  MEDICATION LIST PRIOR TO VISIT: Current Outpatient Medications on File Prior to Visit  Medication Sig Dispense Refill  . aspirin EC  81 MG tablet Take 81 mg by mouth as needed.    Marland Kitchen atorvastatin (LIPITOR) 20 MG tablet TAKE 1 TABLET BY MOUTH EVERYDAY AT BEDTIME 90 tablet 3  . FeFum-FePoly-FA-B Cmp-C-Biot (FOLIVANE-PLUS) CAPS Take 1 capsule by mouth 2 (two) times daily.    Marland Kitchen lamoTRIgine (LAMICTAL) 100 MG tablet Take 100 mg by mouth 2 (two) times daily. Take with Lamotrigine 25 mg to equal 125 mg    . lamoTRIgine (LAMICTAL) 25 MG tablet Take 25 mg by mouth 2 (two) times daily.    . metoprolol tartrate (LOPRESSOR) 25 MG tablet TAKE 1 TABLET BY MOUTH TWICE A DAY 60 tablet 1  . nitroGLYCERIN (NITROSTAT) 0.4 MG SL tablet Place 1 tablet (0.4 mg total) under the tongue every 5 (five) minutes as needed for chest pain. If you require more than two tablets 5 mins apart call 911. 100 tablet 1  . tadalafil (CIALIS) 5 MG tablet Take 5 mg by mouth daily.    Marland Kitchen telmisartan-hydrochlorothiazide (MICARDIS HCT) 40-12.5 MG tablet Take 1 tablet by mouth daily.    . Vitamin D, Ergocalciferol, (DRISDOL) 50000 UNITS CAPS capsule Take 50,000 Units by mouth every Sunday.   5   No current facility-administered medications on file prior to visit.    PAST MEDICAL HISTORY: Past Medical History:  Diagnosis Date  . Acute deep vein thrombosis (DVT) of left femoral vein (HCC) 07/01/2015  . Anxiety   . Diabetes mellitus without complication (HCC)   . Epilepsy (HCC)   . Erectile dysfunction   . Hyperlipidemia   . Hypertension   . Seizures (HCC)     PAST SURGICAL HISTORY:  Past Surgical History:  Procedure Laterality Date  . CHOLECYSTECTOMY    . IR GENERIC HISTORICAL  05/16/2016   IR ANGIO VERTEBRAL SEL VERTEBRAL UNI L MOD SED 05/16/2016 [facility]  . IR GENERIC HISTORICAL  05/16/2016   IR ANGIOGRAM EXTREMITY RIGHT 05/16/2016 [facility]  . IR GENERIC HISTORICAL  05/16/2016   IR ANGIO INTRA EXTRACRAN SEL COM CAROTID INNOMINATE UNI R MOD SED 05/16/2016 [facility]  . KNEE SURGERY    . LEFT HEART CATH AND CORONARY ANGIOGRAPHY N/A 08/04/2019    Procedure: LEFT HEART CATH AND CORONARY ANGIOGRAPHY;  Surgeon: Elder NegusPatwardhan, Manish J, MD;  Location: [facility];  Service: Cardiovascular;  Laterality: N/A;  . RADIOLOGY WITH ANESTHESIA N/A 06/11/2015   Procedure: RADIOLOGY WITH ANESTHESIA;  Surgeon: Lisbeth RenshawNeelesh Nundkumar, MD;  Location: [facility];  Service: Radiology;  Laterality: N/A;    FAMILY HISTORY: The patient family history includes Alzheimer's disease in his mother; Aneurysm in his brother and sister; Diabetes in his father.   SOCIAL HISTORY:  The patient  reports that he quit smoking about 32 years ago. His smoking use included cigarettes. He has a 30.00 pack-year smoking history. He has never used smokeless tobacco. He reports that he does not drink alcohol and does not use drugs.  Review of Systems  Constitutional: Negative for chills and fever.  HENT: Negative for hoarse voice and nosebleeds.   Eyes: Negative for discharge, double vision and pain.  Cardiovascular: Negative for chest pain, claudication, dyspnea on exertion, leg swelling, near-syncope, orthopnea, palpitations, paroxysmal nocturnal dyspnea and syncope.  Respiratory: Negative for hemoptysis and shortness of breath.   Musculoskeletal: Negative for muscle cramps and myalgias.  Gastrointestinal: Negative for abdominal pain, constipation, diarrhea, hematemesis, hematochezia, melena, nausea and vomiting.  Neurological: Negative for dizziness and light-headedness.     PHYSICAL EXAM: Vitals with BMI 09/04/2020 07/25/2020 07/25/2020  Height 5\' 11"  - -  Weight 162 lbs 10 oz - -  BMI 22.69 - -  Systolic 127 104 [id number]  Diastolic 57 65 59  Pulse 44 53 36   CONSTITUTIONAL: Well-developed and well-nourished. No acute distress.  SKIN: Skin is warm and dry. No rash noted. No cyanosis. No pallor. No jaundice HEAD: Normocephalic and atraumatic.  EYES: No scleral icterus MOUTH/THROAT: Moist oral membranes.  NECK: No JVD present. No thyromegaly noted. No carotid bruits  LYMPHATIC:  No visible cervical adenopathy.  CHEST Normal respiratory effort. No intercostal retractions  LUNGS: Clear to auscultation bilaterally.  No stridor. No wheezes. No rales.  CARDIOVASCULAR: Bradycardia, regular, positive S1-S2, no murmurs rubs or gallops appreciated ABDOMINAL: Nonobese, soft, nontender, nondistended positive bowel sounds.  No apparent ascites.  EXTREMITIES: No peripheral edema  HEMATOLOGIC: No significant bruising NEUROLOGIC: Oriented to person, place, and time. Nonfocal. Normal muscle tone.  PSYCHIATRIC: Normal mood and affect. Normal behavior. Cooperative  CARDIAC DATABASE: EKG: 07/25/2020: Sinus rhythm 74 bpmVentricular bigeminy Left atrial enlargement Nonspecific T-abnormality   Echocardiogram: 08/04/2019: Left ventricular ejection fraction, by estimation, is 65 to 70%. The left ventricle has normal function. The left ventricle has no regional wall motion abnormalities. Left ventricular diastolic parameters were normal. Right ventricular systolic function is normal. The right ventricular size is normal. Left atrial size was mildly dilated. Right atrial size was mildly dilated. IVC not well visualized. No signifciant valvular abnormality.IVC   Stress Testing:  07/25/2020: Exercise treadmill stress test performed using Bruce protocol.  Patient reached 7 METS, and 84% of age predicted maximum heart rate.  Exercise capacity was low.  5/10 non-limiting  chest pain reported.  Normal heart rate and hemodynamic response. Rest EKG showed sinus rhythm, no ST-T changesStress EKG showed sinus tachycardia, <1.5 mm up-sloping ST depressions in inferolateral leads, frequent PVC's that continued into recovery period as ventricular bigeminy. Abnormal treadmill stress test with exercise induced PVC's.   Heart Catheterization: 08/04/2019: Right dominant circulation with tortuous, small-medium caliber vessels. TIMI III flow. No angiographically significant coronary artery disease. Normal LVEDP.  Normal LVEF.  MCT 06/27/2019-07/26/2019: Minimum heart rate was noted to be 46 bpm on June 30, 2019 at 1:37 PM. Maximum heart rate was noted to be 120 bpm on July 14, 2019 at 6:19 PM. Average heart rate 70 bpm. No atrial fibrillation detected during the monitoring period. Ventricular ectopic burden reported to be 16%. Patient triggered events illustrate underlying sinus mechanism and at times ventricular ectopy.  Please refer to the official report for more details.  LABORATORY DATA: CBC Latest Ref Rng & Units 01/11/2020 08/03/2019 07/14/2017  WBC 4.0 - 10.5 K/uL 5.4 6.3 -  Hemoglobin 13.0 - 17.0 g/dL 12.6(L) 14.1 17.0  Hematocrit 39.0 - 52.0 % 40.0 43.4 50.0  Platelets 150 - 400 K/uL 224 217 -    CMP Latest Ref Rng & Units 01/11/2020 01/10/2020 11/18/2019  Glucose 70 - 99 mg/dL 024(O) 973(Z) 329(J)  BUN 8 - 23 mg/dL 17 16 16   Creatinine 0.61 - 1.24 mg/dL 2.42 6.83  Sodium 135 - 145 mmol/L 140 145(H) 147(H)  Potassium 3.5 - 5.1 mmol/L 4.0 4.5 4.8  Chloride 98 - 111 mmol/L 101 106 106  CO2 22 - 32 mmol/L 28 25 26   Calcium 8.9 - 10.3 mg/dL 9.6 4.19  Total Protein 6.5 - 8.1 g/dL - - -  Total Bilirubin 0.3 - 1.2 mg/dL - - -  Alkaline Phos 38 - 126 U/L - - -  AST 15 - 41 U/L - - -  ALT 17 - 63 U/L - - -    External Labs: Collected: 06/13/2018 Lipid profile: Total cholesterol 220, triglycerides 69, HDL 72, LDL 132 Hemoglobin A1c: 6.5  12/31/2018 TSH: 1.63  07/2017 serum creatinine 1.4 mg/dL.  FINAL MEDICATION LIST END OF ENCOUNTER: Meds ordered this encounter  Medications  . flecainide (TAMBOCOR) 50 MG tablet    Sig: Take 1 tablet (50 mg total) by mouth 2 (two) times daily.    Dispense:  180 tablet    Refill:  0    There are no discontinued medications.   Current Outpatient Medications:  .  aspirin EC 81 MG tablet, Take 81 mg by mouth as needed., Disp: , Rfl:  .  atorvastatin (LIPITOR) 20 MG tablet, TAKE 1 TABLET BY MOUTH EVERYDAY AT BEDTIME, Disp: 90 tablet,  Rfl: 3 .  FeFum-FePoly-FA-B Cmp-C-Biot (FOLIVANE-PLUS) CAPS, Take 1 capsule by mouth 2 (two) times daily., Disp: , Rfl:  .  flecainide (TAMBOCOR) 50 MG tablet, Take 1 tablet (50 mg total) by mouth 2 (two) times daily., Disp: 180 tablet, Rfl: 0 .  lamoTRIgine (LAMICTAL) 100 MG tablet, Take 100 mg by mouth 2 (two) times daily. Take with Lamotrigine 25 mg to equal 125 mg, Disp: , Rfl:  .  lamoTRIgine (LAMICTAL) 25 MG tablet, Take 25 mg by mouth 2 (two) times daily., Disp: , Rfl:  .  metoprolol tartrate (LOPRESSOR) 25 MG tablet, TAKE 1 TABLET BY MOUTH TWICE A DAY, Disp: 60 tablet, Rfl: 1 .  nitroGLYCERIN (NITROSTAT) 0.4 MG SL tablet, Place 1 tablet (0.4 mg total) under the tongue every 5 (five) minutes as  needed for chest pain. If you require more than two tablets 5 mins apart call 911., Disp: 100 tablet, Rfl: 1 .  tadalafil (CIALIS) 5 MG tablet, Take 5 mg by mouth daily., Disp: , Rfl:  .  telmisartan-hydrochlorothiazide (MICARDIS HCT) 40-12.5 MG tablet, Take 1 tablet by mouth daily., Disp: , Rfl:  .  Vitamin D, Ergocalciferol, (DRISDOL) 50000 UNITS CAPS capsule, Take 50,000 Units by mouth every Sunday. , Disp: , Rfl: 5  IMPRESSION:    ICD-10-CM   1. PVC (premature ventricular contraction)  I49.3 flecainide (TAMBOCOR) 50 MG tablet  2. Essential hypertension  I10   3. Mixed hyperlipidemia  E78.2   4. Bradycardia  R00.1   5. Long term current use of antiarrhythmic drug  Z79.899   6. Former smoker  Z87.891      RECOMMENDATIONS: Darryl [name] is a 70 y.o. male whose past medical history and cardiac risk factors include: Diet-controlled diabetes mellitus type 2, hyperlipidemia, hypertension, advanced age.   Premature ventricular contractions: Symptomatic: Feeling tired, fatigue, decreased physical endurance and shortness of breath Patient had a mobile cardiac ambulatory telemetry back in January 2021 at an outside facility per report a PVC burden of approximately 16% Was started on flecainide 100  mg p.o. twice daily which was transitioned to 50 mg p.o. twice daily as he did not tolerate the higher dose.   Recommend resuming flecainide 50 mg p.o. twice daily for now. Continue metoprolol 25 mg p.o. twice daily. Patient will be referred back to cardiac electrophysiology for further evaluation of his PVC management and I suspect that he is now having symptoms.  Bradycardia, asymptomatic: Most likely secondary to his underlying PVC burden. Restart flecainide Continue Lopressor 25 mg p.o. twice daily  Mixed hyperlipidemia: Continue Lipitor 20 mg p.o. nightly. Patient tolerated the medication well without any side effects or intolerances.  Currently managed by primary care provider.  His benign essential hypertension and non-insulin-dependent diabetes mellitus type 2 which is diet controlled  currently being managed by PCP.  No orders of the defined types were placed in this encounter.  --Continue cardiac medications as reconciled in final medication list. --Return in 3 months (on 12/04/2020) for Follow up PVC management .Marland Kitchen Or sooner if needed. --Continue follow-up with your primary care physician regarding the management of your other chronic comorbid conditions.  Patient's questions and concerns were addressed to his satisfaction. He voices understanding of the instructions provided during this encounter.   This note was created using a voice recognition software as a result there may be grammatical errors inadvertently enclosed that do not reflect the nature of this encounter. Every attempt is made to correct such errors.  Tessa Lerner, [place], [facility]  Pager: [phone] Office: [phone]

## 2020-09-04 ENCOUNTER — Ambulatory Visit: Payer: Medicare PPO | Admitting: Cardiology

## 2020-09-04 ENCOUNTER — Other Ambulatory Visit: Payer: Self-pay

## 2020-09-04 ENCOUNTER — Encounter: Payer: Self-pay | Admitting: Cardiology

## 2020-09-04 VITALS — BP 127/57 | HR 44 | Temp 97.9°F | Resp 16 | Ht 71.0 in | Wt 162.6 lb

## 2020-09-04 DIAGNOSIS — I493 Ventricular premature depolarization: Secondary | ICD-10-CM

## 2020-09-04 DIAGNOSIS — E782 Mixed hyperlipidemia: Secondary | ICD-10-CM

## 2020-09-04 DIAGNOSIS — R001 Bradycardia, unspecified: Secondary | ICD-10-CM

## 2020-09-04 DIAGNOSIS — I10 Essential (primary) hypertension: Secondary | ICD-10-CM

## 2020-09-04 DIAGNOSIS — Z87891 Personal history of nicotine dependence: Secondary | ICD-10-CM | POA: Diagnosis not present

## 2020-09-04 DIAGNOSIS — Z79899 Other long term (current) drug therapy: Secondary | ICD-10-CM | POA: Diagnosis not present

## 2020-09-04 MED ORDER — FLECAINIDE ACETATE 50 MG PO TABS: 50.0000 mg | ORAL_TABLET | Freq: Two times a day (BID) | ORAL | 0 refills | Status: DC

## 2020-09-20 ENCOUNTER — Other Ambulatory Visit: Payer: Self-pay | Admitting: Cardiology

## 2020-09-20 DIAGNOSIS — I493 Ventricular premature depolarization: Secondary | ICD-10-CM

## 2020-09-20 DIAGNOSIS — R079 Chest pain, unspecified: Secondary | ICD-10-CM

## 2020-09-24 DIAGNOSIS — E782 Mixed hyperlipidemia: Secondary | ICD-10-CM | POA: Diagnosis not present

## 2020-09-24 DIAGNOSIS — R413 Other amnesia: Secondary | ICD-10-CM | POA: Diagnosis not present

## 2020-09-24 DIAGNOSIS — E1169 Type 2 diabetes mellitus with other specified complication: Secondary | ICD-10-CM | POA: Diagnosis not present

## 2020-09-24 DIAGNOSIS — I10 Essential (primary) hypertension: Secondary | ICD-10-CM | POA: Diagnosis not present

## 2020-09-27 DIAGNOSIS — R42 Dizziness and giddiness: Secondary | ICD-10-CM | POA: Diagnosis not present

## 2020-09-27 DIAGNOSIS — R001 Bradycardia, unspecified: Secondary | ICD-10-CM | POA: Diagnosis not present

## 2020-09-27 DIAGNOSIS — R531 Weakness: Secondary | ICD-10-CM | POA: Diagnosis not present

## 2020-09-28 DIAGNOSIS — I493 Ventricular premature depolarization: Secondary | ICD-10-CM | POA: Diagnosis not present

## 2020-10-03 ENCOUNTER — Encounter: Payer: Self-pay | Admitting: Internal Medicine

## 2020-10-03 ENCOUNTER — Ambulatory Visit: Payer: Medicare PPO | Admitting: Internal Medicine

## 2020-10-03 ENCOUNTER — Other Ambulatory Visit: Payer: Self-pay

## 2020-10-03 VITALS — BP 130/70 | HR 59 | Ht 71.0 in | Wt 167.2 lb

## 2020-10-03 DIAGNOSIS — I493 Ventricular premature depolarization: Secondary | ICD-10-CM

## 2020-10-03 MED ORDER — FLECAINIDE ACETATE 100 MG PO TABS
100.0000 mg | ORAL_TABLET | Freq: Two times a day (BID) | ORAL | 3 refills | Status: DC
Start: 2020-10-03 — End: 2020-10-16

## 2020-10-03 NOTE — Progress Notes (Signed)
HPI Darryl [name] is referred back today by Dr. Odis Hollingshead for ongoing evaluation of PVC's. The patient was seen by me over a year ago. At that time his PVC burden was around 16% but he was completely asymptomatic. He was placed on flecainide by Dr. Odis Hollingshead. He had the dose decreased to 50 bid and he presents for additional evaluation. He has not had syncope. He notes that he has actually felt better over the past couple of weeks.  No Known Allergies   Current Outpatient Medications  Medication Sig Dispense Refill  . aspirin EC 81 MG tablet Take 81 mg by mouth daily.    Marland Kitchen atorvastatin (LIPITOR) 20 MG tablet TAKE 1 TABLET BY MOUTH EVERYDAY AT BEDTIME 90 tablet 3  . FeFum-FePoly-FA-B Cmp-C-Biot (FOLIVANE-PLUS) CAPS Take 1 capsule by mouth 2 (two) times daily.    . flecainide (TAMBOCOR) 50 MG tablet Take 1 tablet (50 mg total) by mouth 2 (two) times daily. 180 tablet 0  . lamoTRIgine (LAMICTAL) 100 MG tablet Take 100 mg by mouth 2 (two) times daily. Take with Lamotrigine 25 mg to equal 125 mg    . lamoTRIgine (LAMICTAL) 25 MG tablet Take 25 mg by mouth 2 (two) times daily.    . metoprolol tartrate (LOPRESSOR) 25 MG tablet TAKE 1 TABLET BY MOUTH TWICE A DAY 60 tablet 1  . nitroGLYCERIN (NITROSTAT) 0.4 MG SL tablet Place 1 tablet (0.4 mg total) under the tongue every 5 (five) minutes as needed for chest pain. If you require more than two tablets 5 mins apart call 911. 100 tablet 1  . potassium chloride SA (KLOR-CON M20) 20 MEQ tablet Take 20 mEq by mouth 2 (two) times daily.    . tadalafil (CIALIS) 5 MG tablet Take 5 mg by mouth daily.    Marland Kitchen telmisartan-hydrochlorothiazide (MICARDIS HCT) 40-12.5 MG tablet Take 1 tablet by mouth daily.    . Vitamin D, Ergocalciferol, (DRISDOL) 50000 UNITS CAPS capsule Take 50,000 Units by mouth every Sunday.   5   No current facility-administered medications for this visit.     Past Medical History:  Diagnosis Date  . Acute deep vein thrombosis (DVT) of left  femoral vein (HCC) 07/01/2015  . Anxiety   . Diabetes mellitus without complication (HCC)   . Epilepsy (HCC)   . Erectile dysfunction   . Hyperlipidemia   . Hypertension   . Seizures (HCC)     ROS:   All systems reviewed and negative except as noted in the HPI.   Past Surgical History:  Procedure Laterality Date  . CHOLECYSTECTOMY    . IR GENERIC HISTORICAL  05/16/2016   IR ANGIO VERTEBRAL SEL VERTEBRAL UNI L MOD SED 05/16/2016 [facility]  . IR GENERIC HISTORICAL  05/16/2016   IR ANGIOGRAM EXTREMITY RIGHT 05/16/2016 [facility]  . IR GENERIC HISTORICAL  05/16/2016   IR ANGIO INTRA EXTRACRAN SEL COM CAROTID INNOMINATE UNI R MOD SED 05/16/2016 [facility]  . KNEE SURGERY    . LEFT HEART CATH AND CORONARY ANGIOGRAPHY N/A 08/04/2019   Procedure: LEFT HEART CATH AND CORONARY ANGIOGRAPHY;  Surgeon: Elder Negus, MD;  [facility]: [facility];  Service: Cardiovascular;  Laterality: N/A;  . RADIOLOGY WITH ANESTHESIA N/A 06/11/2015   Procedure: RADIOLOGY WITH ANESTHESIA;  Surgeon: Lisbeth Renshaw, MD;  Location: [facility];  Service: Radiology;  Laterality: N/A;     Family History  Problem Relation Age of Onset  . Alzheimer's disease Mother   . Diabetes Father   .  Aneurysm Brother   . Aneurysm Sister      Social History   Socioeconomic History  . Marital status: Married    Spouse name: Not on file  . Number of children: 4  . Years of education: Not on file  . Highest education level: Not on file  Occupational History  . Not on file  Tobacco Use  . Smoking status: Former Smoker    Packs/day: 2.00    Years: 15.00    Pack years: 30.00    Types: Cigarettes    Quit date: 06/02/1988    Years since quitting: 32.3  . Smokeless tobacco: Never Used  Vaping Use  . Vaping Use: Never used  Substance and Sexual Activity  . Alcohol use: No  . Drug use: No  . Sexual activity: Not on file  Other Topics Concern  . Not on file  Social History Narrative  . Not on file    Social Determinants of Health   Financial Resource Strain: Not on file  Food Insecurity: Not on file  Transportation Needs: Not on file  Physical Activity: Not on file  Stress: Not on file  Social Connections: Not on file  Intimate Partner Violence: Not on file     BP 130/70   Pulse (!) 59   Ht 5\' 11"  (1.803 m)   Wt 167 lb 3.2 oz (75.8 kg)   SpO2 99%   BMI 23.32 kg/m   Physical Exam:  Well appearing 70 yo man, NAD HEENT: Unremarkable Neck:  6 cm JVD, no thyromegally Lymphatics:  No adenopathy Back:  No CVA tenderness Lungs:  Clear HEART:  IRegular rate rhythm, no murmurs, no rubs, no clicks Abd:  soft, positive bowel sounds, no organomegally, no rebound, no guarding Ext:  2 plus pulses, no edema, no cyanosis, no clubbing Skin:  No rashes no nodules Neuro:  CN II through XII intact, motor grossly intact  EKG - nsr with bigem PVC"s  Assess/Plan: 1. PVC's - I have discussed the options for treatment in detail. Trying to take the 100 mg bid of flecainide would be easiest and probably safest. Amiodarone was also discussed as was catheter ablation. He has no interest in ablation. He will retry the flecainide. We will recheck and ECG in 2 weeks. I'll see him back in a couple of months. I might have him repeat the heart monitor for 3 days.  73 Darryl Winnick,MD

## 2020-10-03 NOTE — Patient Instructions (Addendum)
Medication Instructions:  Your physician has recommended you make the following change in your medication:  1.  INCREASE your flecainide-- Take 100 mg by mouth TWICE a day  Labwork: None ordered.  Testing/Procedures: None ordered.  Follow-Up:  Nurse visit for EKG --Please schedule a nurse visit in 2 weeks for an EKG  Your physician wants you to follow-up in: 2-3 months with Lewayne Bunting, MD    Any Other Special Instructions Will Be Listed Below (If Applicable).  If you need a refill on your cardiac medications before your next appointment, please call your pharmacy.

## 2020-10-16 ENCOUNTER — Other Ambulatory Visit: Payer: Self-pay

## 2020-10-16 ENCOUNTER — Ambulatory Visit (INDEPENDENT_AMBULATORY_CARE_PROVIDER_SITE_OTHER): Payer: Medicare PPO

## 2020-10-16 ENCOUNTER — Other Ambulatory Visit: Payer: Self-pay | Admitting: Cardiology

## 2020-10-16 VITALS — HR 39 | Ht 71.0 in | Wt 166.0 lb

## 2020-10-16 DIAGNOSIS — Z79899 Other long term (current) drug therapy: Secondary | ICD-10-CM

## 2020-10-16 DIAGNOSIS — R079 Chest pain, unspecified: Secondary | ICD-10-CM

## 2020-10-16 DIAGNOSIS — I493 Ventricular premature depolarization: Secondary | ICD-10-CM

## 2020-10-16 MED ORDER — FLECAINIDE ACETATE 50 MG PO TABS
50.0000 mg | ORAL_TABLET | Freq: Two times a day (BID) | ORAL | 3 refills | Status: DC
Start: 2020-10-16 — End: 2020-12-28

## 2020-10-16 NOTE — Progress Notes (Signed)
1.) Reason for visit: EKG   2.) Name of MD requesting visit: Lewayne Bunting, MD  3.) H&P: Flecainide dose increased to 100 mg PO BID on 10/03/20.  2 week FU EKG was ordered at [facility].    4.) ROS related to problem: Patient expresses that he feels very tired lately.    5.) Assessment and plan per MD: Dr.Taylor read EKG as sinus brady.  Dr. Ladona Ridgel gave a verbal order to decrease flecainide dose to 50 mg PO BID.  Patient notified of this change and avs printed out with medication change.  Script sent to patient pharmacy of choice.

## 2020-10-16 NOTE — Patient Instructions (Signed)
Medication Instructions:  Your physician has recommended you make the following change in your medication:   DECREASE:  Flecainide (Tambocor) to 50 mg by mouth twice daily  *If you need a refill on your cardiac medications before your next appointment, please call your pharmacy*   Lab Work: NONE If you have labs (blood work) drawn today and your tests are completely normal, you will receive your results only by: Marland Kitchen MyChart Message (if you have MyChart) OR . A paper copy in the mail If you have any lab test that is abnormal or we need to change your treatment, we will call you to review the results.   Testing/Procedures: NONE   Follow-Up: AS SCHEDULED At [facility], you and your health needs are our priority.  As part of our continuing mission to provide you with exceptional heart care, we have created designated Provider Care Teams.  These Care Teams include your primary Cardiologist (physician) and Advanced Practice Providers (APPs -  Physician Assistants and Nurse Practitioners) who all work together to provide you with the care you need, when you need it.

## 2020-10-18 DIAGNOSIS — E119 Type 2 diabetes mellitus without complications: Secondary | ICD-10-CM | POA: Diagnosis not present

## 2020-10-18 DIAGNOSIS — H40023 Open angle with borderline findings, high risk, bilateral: Secondary | ICD-10-CM | POA: Diagnosis not present

## 2020-10-18 DIAGNOSIS — H2513 Age-related nuclear cataract, bilateral: Secondary | ICD-10-CM | POA: Diagnosis not present

## 2020-11-13 ENCOUNTER — Other Ambulatory Visit: Payer: Self-pay | Admitting: Cardiology

## 2020-11-13 DIAGNOSIS — I493 Ventricular premature depolarization: Secondary | ICD-10-CM

## 2020-11-13 DIAGNOSIS — R079 Chest pain, unspecified: Secondary | ICD-10-CM

## 2020-12-04 ENCOUNTER — Ambulatory Visit: Payer: Medicare PPO | Admitting: Cardiology

## 2020-12-07 ENCOUNTER — Ambulatory Visit: Payer: Medicare PPO | Admitting: Cardiology

## 2020-12-11 DIAGNOSIS — I119 Hypertensive heart disease without heart failure: Secondary | ICD-10-CM | POA: Diagnosis not present

## 2020-12-11 DIAGNOSIS — G4089 Other seizures: Secondary | ICD-10-CM | POA: Diagnosis not present

## 2020-12-11 DIAGNOSIS — I10 Essential (primary) hypertension: Secondary | ICD-10-CM | POA: Diagnosis not present

## 2020-12-11 DIAGNOSIS — R42 Dizziness and giddiness: Secondary | ICD-10-CM | POA: Diagnosis not present

## 2020-12-11 DIAGNOSIS — I728 Aneurysm of other specified arteries: Secondary | ICD-10-CM | POA: Diagnosis not present

## 2020-12-11 DIAGNOSIS — E1169 Type 2 diabetes mellitus with other specified complication: Secondary | ICD-10-CM | POA: Diagnosis not present

## 2020-12-11 DIAGNOSIS — E782 Mixed hyperlipidemia: Secondary | ICD-10-CM | POA: Diagnosis not present

## 2020-12-11 DIAGNOSIS — R5383 Other fatigue: Secondary | ICD-10-CM | POA: Diagnosis not present

## 2020-12-11 DIAGNOSIS — R001 Bradycardia, unspecified: Secondary | ICD-10-CM | POA: Diagnosis not present

## 2020-12-21 ENCOUNTER — Ambulatory Visit: Payer: Medicare PPO | Admitting: Cardiology

## 2020-12-21 DIAGNOSIS — E782 Mixed hyperlipidemia: Secondary | ICD-10-CM

## 2020-12-21 DIAGNOSIS — I493 Ventricular premature depolarization: Secondary | ICD-10-CM

## 2020-12-21 DIAGNOSIS — Z79899 Other long term (current) drug therapy: Secondary | ICD-10-CM

## 2020-12-21 DIAGNOSIS — I10 Essential (primary) hypertension: Secondary | ICD-10-CM

## 2020-12-21 DIAGNOSIS — Z87891 Personal history of nicotine dependence: Secondary | ICD-10-CM

## 2020-12-21 DIAGNOSIS — R001 Bradycardia, unspecified: Secondary | ICD-10-CM

## 2020-12-26 ENCOUNTER — Ambulatory Visit: Payer: Medicare PPO | Admitting: Cardiology

## 2020-12-28 ENCOUNTER — Ambulatory Visit: Payer: Medicare PPO | Admitting: Cardiology

## 2020-12-28 ENCOUNTER — Encounter: Payer: Self-pay | Admitting: Cardiology

## 2020-12-28 ENCOUNTER — Other Ambulatory Visit: Payer: Self-pay

## 2020-12-28 VITALS — BP 115/68 | HR 38 | Temp 98.3°F | Resp 16 | Ht 71.0 in | Wt 159.0 lb

## 2020-12-28 DIAGNOSIS — I10 Essential (primary) hypertension: Secondary | ICD-10-CM

## 2020-12-28 DIAGNOSIS — Z79899 Other long term (current) drug therapy: Secondary | ICD-10-CM

## 2020-12-28 DIAGNOSIS — R42 Dizziness and giddiness: Secondary | ICD-10-CM | POA: Diagnosis not present

## 2020-12-28 DIAGNOSIS — R001 Bradycardia, unspecified: Secondary | ICD-10-CM | POA: Diagnosis not present

## 2020-12-28 DIAGNOSIS — E782 Mixed hyperlipidemia: Secondary | ICD-10-CM | POA: Diagnosis not present

## 2020-12-28 DIAGNOSIS — I493 Ventricular premature depolarization: Secondary | ICD-10-CM

## 2020-12-28 DIAGNOSIS — Z87891 Personal history of nicotine dependence: Secondary | ICD-10-CM

## 2020-12-28 MED ORDER — TELMISARTAN 40 MG PO TABS: 40.0000 mg | ORAL_TABLET | Freq: Every morning | ORAL | 2 refills | Status: DC

## 2020-12-28 NOTE — Progress Notes (Signed)
Date: 12/28/20 Last Office Visit: 09/04/2020   Chief Complaint  Patient presents with   premature ventricular contraction   Follow-up    HPI  Darryl [name] is a 70 y.o. male who presents to the office with a chief complaint of " 28-month follow-up for PVC management." Patient's past medical history and cardiac risk factors include: Diet-controlled diabetes mellitus type 2, hyperlipidemia, hypertension, advanced age.   Patient initially presented to the office for evaluation of shortness of breath which was most likely secondary to his high PVC burden.  He had mobile cardiac ambulatory telemetry which noted a PVC burden of approximately 16%.  Since then his medications have been uptitrated and currently on Lopressor and flecainide.  Patient does not have his medication bottles with him or medication list to provide accurate medication reconciliation.  I asked him to call the office when he reaches home to reconcile her medications so that recommendations can be made appropriately.  He recently went to his PCP and was noted to have a heart rate in the 40s and now presents for follow-up.  Clinically he is asymptomatic denies feeling tired, fatigue, near-syncope or syncope.  He does get lightheaded and dizziness if he changes positions quickly.  He has an upcoming appointment with Dr. Ladona Ridgel for cardiac electrophysiology in August 2022.  ALLERGIES: No Known Allergies  MEDICATION LIST PRIOR TO VISIT: Current Outpatient Medications on File Prior to Visit  Medication Sig Dispense Refill   aspirin EC 81 MG tablet Take 81 mg by mouth daily.     atorvastatin (LIPITOR) 20 MG tablet TAKE 1 TABLET BY MOUTH EVERYDAY AT BEDTIME 90 tablet 3   lamoTRIgine (LAMICTAL) 100 MG tablet Take 100 mg by mouth 2 (two) times daily. Take with Lamotrigine 25 mg to equal 125 mg     lamoTRIgine (LAMICTAL) 25 MG tablet Take 25 mg by mouth 2 (two) times daily.     metoprolol tartrate (LOPRESSOR) 25 MG tablet TAKE 1  TABLET BY MOUTH TWICE A DAY 60 tablet 6   nitroGLYCERIN (NITROSTAT) 0.4 MG SL tablet Place 1 tablet (0.4 mg total) under the tongue every 5 (five) minutes as needed for chest pain. If you require more than two tablets 5 mins apart call 911. 100 tablet 1   tadalafil (CIALIS) 5 MG tablet Take 5 mg by mouth daily.     No current facility-administered medications on file prior to visit.    PAST MEDICAL HISTORY: Past Medical History:  Diagnosis Date   Acute deep vein thrombosis (DVT) of left femoral vein (HCC) 07/01/2015   Anxiety    Diabetes mellitus without complication (HCC)    Epilepsy (HCC)    Erectile dysfunction    Hyperlipidemia    Hypertension    Seizures (HCC)     PAST SURGICAL HISTORY: Past Surgical History:  Procedure Laterality Date   CHOLECYSTECTOMY     IR GENERIC HISTORICAL  05/16/2016   IR ANGIO VERTEBRAL SEL VERTEBRAL UNI L MOD SED 05/16/2016 [facility]   IR GENERIC HISTORICAL  05/16/2016   IR ANGIOGRAM EXTREMITY RIGHT 05/16/2016 [facility]   IR GENERIC HISTORICAL  05/16/2016   IR ANGIO INTRA EXTRACRAN SEL COM CAROTID INNOMINATE UNI R MOD SED 05/16/2016 [facility]   KNEE SURGERY     LEFT HEART CATH AND CORONARY ANGIOGRAPHY N/A 08/04/2019   Procedure: LEFT HEART CATH AND CORONARY ANGIOGRAPHY;  Surgeon: Elder Negus, MD;  Location: [facility];  Service: Cardiovascular;  Laterality: N/A;   RADIOLOGY WITH  ANESTHESIA N/A 06/11/2015   Procedure: RADIOLOGY WITH ANESTHESIA;  Surgeon: Lisbeth Renshaw, MD;  Location: [facility] OR;  Service: Radiology;  Laterality: N/A;    FAMILY HISTORY: The patient family history includes Alzheimer's disease in his mother; Aneurysm in his brother and sister; Diabetes in his father.   SOCIAL HISTORY:  The patient  reports that he quit smoking about 32 years ago. His smoking use included cigarettes. He has a 30.00 pack-year smoking history. He has never used smokeless tobacco. He reports that he does not drink alcohol and does  not use drugs.  Review of Systems  Constitutional: Negative for chills and fever.  HENT:  Negative for hoarse voice and nosebleeds.   Eyes:  Negative for discharge, double vision and pain.  Cardiovascular:  Negative for chest pain, claudication, dyspnea on exertion, leg swelling, near-syncope, orthopnea, palpitations, paroxysmal nocturnal dyspnea and syncope.  Respiratory:  Negative for hemoptysis and shortness of breath.   Musculoskeletal:  Negative for muscle cramps and myalgias.  Gastrointestinal:  Negative for abdominal pain, constipation, diarrhea, hematemesis, hematochezia, melena, nausea and vomiting.  Neurological:  Negative for dizziness and light-headedness.    PHYSICAL EXAM: Vitals with BMI 12/28/2020 10/16/2020 10/03/2020  Height 5\' 11"  5\' 11"  5\' 11"   Weight 159 lbs 166 lbs 167 lbs 3 oz  BMI 22.19 23.16 23.33  Systolic 115 - 130  Diastolic 68 - 70  Pulse 38 39 59   Orthostatic VS for the past 72 hrs (Last 3 readings):  Orthostatic BP Patient Position BP Location Cuff Size Orthostatic Pulse  12/28/20 1430 96/63 Standing Left Arm Normal (!) 40  12/28/20 1429 123/66 Sitting Left Arm Normal (!) 40  12/28/20 1428 122/71 Supine Left Arm Normal (!) 41   CONSTITUTIONAL: Well-developed and well-nourished. No acute distress.  SKIN: Skin is warm and dry. No rash noted. No cyanosis. No pallor. No jaundice HEAD: Normocephalic and atraumatic.  EYES: No scleral icterus MOUTH/THROAT: Moist oral membranes.  NECK: No JVD present. No thyromegaly noted. No carotid bruits  LYMPHATIC: No visible cervical adenopathy.  CHEST Normal respiratory effort. No intercostal retractions  LUNGS: Clear to auscultation bilaterally.  No stridor. No wheezes. No rales.  CARDIOVASCULAR: Bradycardia, regular, positive S1-S2, no murmurs rubs or gallops appreciated ABDOMINAL: Nonobese, soft, nontender, nondistended positive bowel sounds.  No apparent ascites.  EXTREMITIES: No peripheral edema  HEMATOLOGIC: No  significant bruising NEUROLOGIC: Oriented to person, place, and time. Nonfocal. Normal muscle tone.  PSYCHIATRIC: Normal mood and affect. Normal behavior. Cooperative  CARDIAC DATABASE: EKG: 12/28/2020: Marked sinus bradycardia, 41 bpm, frequent PVCs.  Echocardiogram: 08/04/2019: Left ventricular ejection fraction, by estimation, is 65 to 70%. The left ventricle has normal function. The left ventricle has no regional wall motion abnormalities. Left ventricular diastolic parameters were normal. Right ventricular systolic function is normal. The right ventricular size is normal. Left atrial size was mildly dilated. Right atrial size was mildly dilated. IVC not well visualized. No signifciant valvular abnormality.IVC   Stress Testing:  07/25/2020: Exercise treadmill stress test performed using Bruce protocol.  Patient reached 7 METS, and 84% of age predicted maximum heart rate.  Exercise capacity was low.  5/10 non-limiting chest pain reported.  Normal heart rate and hemodynamic response. Rest EKG showed sinus rhythm, no ST-T changesStress EKG showed sinus tachycardia, <1.5 mm up-sloping ST depressions in inferolateral leads, frequent PVC's that continued into recovery period as ventricular bigeminy. Abnormal treadmill stress test with exercise induced PVC's.   Heart Catheterization: 08/04/2019: Right dominant circulation with tortuous, small-medium caliber vessels.  TIMI III flow. No angiographically significant coronary artery disease. Normal LVEDP. Normal LVEF.  MCT 06/27/2019-07/26/2019: Minimum heart rate was noted to be 46 bpm on June 30, 2019 at 1:37 PM. Maximum heart rate was noted to be 120 bpm on July 14, 2019 at 6:19 PM. Average heart rate 70 bpm. No atrial fibrillation detected during the monitoring period. Ventricular ectopic burden reported to be 16%. Patient triggered events illustrate underlying sinus mechanism and at times ventricular ectopy.  Please refer to the official report  for more details.  LABORATORY DATA: CBC Latest Ref Rng & Units 01/11/2020 08/03/2019 07/14/2017  WBC 4.0 - 10.5 K/uL 5.4 6.3 -  Hemoglobin 13.0 - 17.0 g/dL 12.6(L) 14.1 17.0  Hematocrit 39.0 - 52.0 % 40.0 43.4 50.0  Platelets 150 - 400 K/uL 224 217 -    CMP Latest Ref Rng & Units 01/11/2020 01/10/2020 11/18/2019  Glucose 70 - 99 mg/dL 161(W133(H) 960(A113(H) 540(J124(H)  BUN 8 - 23 mg/dL 17 16 16   Creatinine 0.61 - 1.24 mg/dL 8.111.13 9.141.25 7.821.15  Sodium 135 - 145 mmol/L 140 145(H) 147(H)  Potassium 3.5 - 5.1 mmol/L 4.0 4.5 4.8  Chloride 98 - 111 mmol/L 101 106 106  CO2 22 - 32 mmol/L 28 25 26   Calcium 8.9 - 10.3 mg/dL 9.6 95.610.2 21.310.0  Total Protein 6.5 - 8.1 g/dL - - -  Total Bilirubin 0.3 - 1.2 mg/dL - - -  Alkaline Phos 38 - 126 U/L - - -  AST 15 - 41 U/L - - -  ALT 17 - 63 U/L - - -    External Labs: Collected: 06/13/2018 Lipid profile: Total cholesterol 220, triglycerides 69, HDL 72, LDL 132 Hemoglobin A1c: 6.5  12/31/2018 TSH: 1.63  07/2017 serum creatinine 1.4 mg/dL.  FINAL MEDICATION LIST END OF ENCOUNTER: Meds ordered this encounter  Medications   telmisartan (MICARDIS) 40 MG tablet    Sig: Take 1 tablet (40 mg total) by mouth every morning.    Dispense:  30 tablet    Refill:  2     Medications Discontinued During This Encounter  Medication Reason   lamoTRIgine (LAMICTAL) 25 MG tablet Error   potassium chloride SA (KLOR-CON) 20 MEQ tablet Error   Vitamin D, Ergocalciferol, (DRISDOL) 50000 UNITS CAPS capsule Error   FeFum-FePoly-FA-B Cmp-C-Biot (FOLIVANE-PLUS) CAPS Error   flecainide (TAMBOCOR) 50 MG tablet Discontinued by provider   telmisartan-hydrochlorothiazide (MICARDIS HCT) 40-12.5 MG tablet Change in therapy     Current Outpatient Medications:    aspirin EC 81 MG tablet, Take 81 mg by mouth daily., Disp: , Rfl:    atorvastatin (LIPITOR) 20 MG tablet, TAKE 1 TABLET BY MOUTH EVERYDAY AT BEDTIME, Disp: 90 tablet, Rfl: 3   lamoTRIgine (LAMICTAL) 100 MG tablet, Take 100 mg by mouth  2 (two) times daily. Take with Lamotrigine 25 mg to equal 125 mg, Disp: , Rfl:    lamoTRIgine (LAMICTAL) 25 MG tablet, Take 25 mg by mouth 2 (two) times daily., Disp: , Rfl:    metoprolol tartrate (LOPRESSOR) 25 MG tablet, TAKE 1 TABLET BY MOUTH TWICE A DAY, Disp: 60 tablet, Rfl: 6   nitroGLYCERIN (NITROSTAT) 0.4 MG SL tablet, Place 1 tablet (0.4 mg total) under the tongue every 5 (five) minutes as needed for chest pain. If you require more than two tablets 5 mins apart call 911., Disp: 100 tablet, Rfl: 1   tadalafil (CIALIS) 5 MG tablet, Take 5 mg by mouth daily., Disp: , Rfl:    telmisartan (MICARDIS) 40 MG  tablet, Take 1 tablet (40 mg total) by mouth every morning., Disp: 30 tablet, Rfl: 2  IMPRESSION:    ICD-10-CM   1. Bradycardia  R00.1     2. Orthostatic dizziness  R42 telmisartan (MICARDIS) 40 MG tablet    3. PVC (premature ventricular contraction)  I49.3 EKG 12-Lead    4. Long term current use of antiarrhythmic drug  Z79.899     5. Essential hypertension  I10 telmisartan (MICARDIS) 40 MG tablet    6. Mixed hyperlipidemia  E78.2     7. Former smoker  Z87.891        RECOMMENDATIONS: Darryl [name] is a 70 y.o. male whose past medical history and cardiac risk factors include: Diet-controlled diabetes mellitus type 2, hyperlipidemia, hypertension, advanced age.   Premature ventricular contractions: Back in January 2021 at an outside facility  PVC burden of approximately 16%, per report. He has had better symptom management with metoprolol as compared to flecainide. He did not tolerate flecainide 100 mg p.o. twice daily in the past and now on 50mg  po bid.  Will hold Flecainide for now until he see Dr. .  I suspect he may be predisposed to cardiac dysrhythmias as he is on Lamictal (another sodium channel blocker) for underlying seizures disorder. In the past he has been recommended to consider PVC ablation by Dr. Ladona Ridgel.  We spoke about this at great length and if it still  continues to be an option I have asked him to consider it. He has an upcoming appointment with Dr. Ladona Ridgel on 01/11/2021  Bradycardia, asymptomatic: Most likely secondary to his underlying PVC burden. Continue Lopressor 25 mg p.o. twice daily See above  Orthostatic hypotension/dizziness: Will discontinue telmisartan/hydrochlorothiazide. Will prescribe telmisartan only at 40 mg p.o. every morning. Patient is asked to change positions slowly, keeping himself well-hydrated, avoiding triggers.  Mixed hyperlipidemia: Continue Lipitor 20 mg p.o. nightly. Patient tolerated the medication well without any side effects or intolerances.  Currently managed by primary care provider.  His benign essential hypertension and non-insulin-dependent diabetes mellitus type 2 which is diet controlled  currently being managed by PCP.  Orders Placed This Encounter  Procedures   EKG 12-Lead    --Continue cardiac medications as reconciled in final medication list. --Return in about 2 months (around 02/28/2021) for Follow up bradycardia... Or sooner if needed. --Continue follow-up with your primary care physician regarding the management of your other chronic comorbid conditions.  Patient's questions and concerns were addressed to his satisfaction. He voices understanding of the instructions provided during this encounter.   This note was created using a voice recognition software as a result there may be grammatical errors inadvertently enclosed that do not reflect the nature of this encounter. Every attempt is made to correct such errors.  03/02/2021, Tessa Lerner, [facility]  Pager: [phone] Office: [phone]

## 2021-01-07 DIAGNOSIS — E1169 Type 2 diabetes mellitus with other specified complication: Secondary | ICD-10-CM | POA: Diagnosis not present

## 2021-01-07 DIAGNOSIS — R4189 Other symptoms and signs involving cognitive functions and awareness: Secondary | ICD-10-CM | POA: Diagnosis not present

## 2021-01-07 DIAGNOSIS — I728 Aneurysm of other specified arteries: Secondary | ICD-10-CM | POA: Diagnosis not present

## 2021-01-11 ENCOUNTER — Other Ambulatory Visit: Payer: Self-pay

## 2021-01-11 ENCOUNTER — Ambulatory Visit: Payer: Medicare PPO | Admitting: Internal Medicine

## 2021-01-11 VITALS — BP 140/70 | HR 53 | Ht 70.0 in | Wt 162.8 lb

## 2021-01-11 DIAGNOSIS — R001 Bradycardia, unspecified: Secondary | ICD-10-CM

## 2021-01-11 DIAGNOSIS — I493 Ventricular premature depolarization: Secondary | ICD-10-CM | POA: Diagnosis not present

## 2021-01-11 NOTE — Progress Notes (Signed)
HPI [name] is referred back today by Dr. Odis Hollingshead for ongoing evaluation of PVC's. He is a pleasant 70 yo man with dyslipidemia and HTN who was found to have mildly symptomatic PVC's. When I saw him several months ago his flecainide was increased from 50 to 100 bid. However he became intolerant and the flecainide was stopped. He has not noted any worsening of his symptoms. His palpitations are fairly mild. He denies syncope or sob but does note some non-exertional chest pressure.  No Known Allergies   Current Outpatient Medications  Medication Sig Dispense Refill   aspirin EC 81 MG tablet Take 81 mg by mouth daily.     atorvastatin (LIPITOR) 20 MG tablet TAKE 1 TABLET BY MOUTH EVERYDAY AT BEDTIME 90 tablet 3   lamoTRIgine (LAMICTAL) 100 MG tablet Take 100 mg by mouth 2 (two) times daily. Take with Lamotrigine 25 mg to equal 125 mg     lamoTRIgine (LAMICTAL) 25 MG tablet Take 25 mg by mouth 2 (two) times daily.     metoprolol tartrate (LOPRESSOR) 25 MG tablet TAKE 1 TABLET BY MOUTH TWICE A DAY 60 tablet 6   tadalafil (CIALIS) 5 MG tablet Take 5 mg by mouth daily.     telmisartan (MICARDIS) 40 MG tablet Take 1 tablet (40 mg total) by mouth every morning. 30 tablet 2   nitroGLYCERIN (NITROSTAT) 0.4 MG SL tablet Place 1 tablet (0.4 mg total) under the tongue every 5 (five) minutes as needed for chest pain. If you require more than two tablets 5 mins apart call 911. 100 tablet 1   No current facility-administered medications for this visit.     Past Medical History:  Diagnosis Date   Acute deep vein thrombosis (DVT) of left femoral vein (HCC) 07/01/2015   Anxiety    Diabetes mellitus without complication (HCC)    Epilepsy (HCC)    Erectile dysfunction    Hyperlipidemia    Hypertension    Seizures (HCC)     ROS:   All systems reviewed and negative except as noted in the HPI.   Past Surgical History:  Procedure Laterality Date   CHOLECYSTECTOMY     IR GENERIC HISTORICAL   05/16/2016   IR ANGIO VERTEBRAL SEL VERTEBRAL UNI L MOD SED 05/16/2016 [facility]   IR GENERIC HISTORICAL  05/16/2016   IR ANGIOGRAM EXTREMITY RIGHT 05/16/2016 [facility]   IR GENERIC HISTORICAL  05/16/2016   IR ANGIO INTRA EXTRACRAN SEL COM CAROTID INNOMINATE UNI R MOD SED 05/16/2016 [facility]   KNEE SURGERY     LEFT HEART CATH AND CORONARY ANGIOGRAPHY N/A 08/04/2019   Procedure: LEFT HEART CATH AND CORONARY ANGIOGRAPHY;  Surgeon: Elder Negus, MD;  Location: [facility];  Service: Cardiovascular;  Laterality: N/A;   RADIOLOGY WITH ANESTHESIA N/A 06/11/2015   Procedure: RADIOLOGY WITH ANESTHESIA;  Surgeon: Lisbeth Renshaw, MD;  Location: [facility];  Service: Radiology;  Laterality: N/A;     Family History  Problem Relation Age of Onset   Alzheimer's disease Mother    Diabetes Father    Aneurysm Brother    Aneurysm Sister      Social History   Socioeconomic History   Marital status: Married    Spouse name: Not on file   Number of children: 4   Years of education: Not on file   Highest education level: Not on file  Occupational History   Not on file  Tobacco Use   Smoking status:  Former    Packs/day: 2.00    Years: 15.00    Pack years: 30.00    Types: Cigarettes    Quit date: 06/02/1988    Years since quitting: 32.6   Smokeless tobacco: Never  Vaping Use   Vaping Use: Never used  Substance and Sexual Activity   Alcohol use: No   Drug use: No   Sexual activity: Not on file  Other Topics Concern   Not on file  Social History Narrative   Not on file   Social Determinants of Health   Financial Resource Strain: Not on file  Food Insecurity: Not on file  Transportation Needs: Not on file  Physical Activity: Not on file  Stress: Not on file  Social Connections: Not on file  Intimate Partner Violence: Not on file     BP 140/70   Pulse (!) 53   Ht 5\' 10"  (1.778 m)   Wt 162 lb 12.8 oz (73.8 kg)   SpO2 99%   BMI 23.36 kg/m   Physical  Exam:  Well appearing 70 yo man, NAD HEENT: Unremarkable Neck:  6 cmJVD, no thyromegally Lymphatics:  No adenopathy Back:  No CVA tenderness Lungs:  Clear with no wheezes HEART:  Regular rate rhythm, no murmurs, no rubs, no clicks Abd:  soft, positive bowel sounds, no organomegally, no rebound, no guarding Ext:  2 plus pulses, no edema, no cyanosis, no clubbing Skin:  No rashes no nodules Neuro:  CN II through XII intact, motor grossly intact   Assess/Plan:  PVC's - he is now off of the flecainide and is only on low dose beta blocker. He appears to feel well. I have recommended he continue the beta blocker. We discussed other AA drugs and ablation but at this point I do not think that his symptoms are severe enough to recommend other treatments. I asked him to avoid caffeine and ETOH. HTN - his bp is fairly well controlled. He will continue the ACE inhibitor and beta blocker.  73 Jathen Sudano,MD

## 2021-01-11 NOTE — Patient Instructions (Addendum)
Medication Instructions:  Your physician recommends that you continue on your current medications as directed. Please refer to the Current Medication list given to you today.  Labwork: None ordered.  Testing/Procedures: None ordered.  Follow-Up: Your physician wants you to follow-up in: as needed with Gregg Taylor, MD    Any Other Special Instructions Will Be Listed Below (If Applicable).  If you need a refill on your cardiac medications before your next appointment, please call your pharmacy.       

## 2021-01-18 ENCOUNTER — Ambulatory Visit: Payer: Medicare PPO | Admitting: Cardiology

## 2021-01-25 DIAGNOSIS — I4891 Unspecified atrial fibrillation: Secondary | ICD-10-CM | POA: Diagnosis not present

## 2021-01-25 DIAGNOSIS — D649 Anemia, unspecified: Secondary | ICD-10-CM | POA: Diagnosis not present

## 2021-01-25 DIAGNOSIS — I10 Essential (primary) hypertension: Secondary | ICD-10-CM | POA: Diagnosis not present

## 2021-01-25 DIAGNOSIS — D6869 Other thrombophilia: Secondary | ICD-10-CM | POA: Diagnosis not present

## 2021-01-25 DIAGNOSIS — E785 Hyperlipidemia, unspecified: Secondary | ICD-10-CM | POA: Diagnosis not present

## 2021-01-25 DIAGNOSIS — G40909 Epilepsy, unspecified, not intractable, without status epilepticus: Secondary | ICD-10-CM | POA: Diagnosis not present

## 2021-01-25 DIAGNOSIS — N529 Male erectile dysfunction, unspecified: Secondary | ICD-10-CM | POA: Diagnosis not present

## 2021-01-25 DIAGNOSIS — I69318 Other symptoms and signs involving cognitive functions following cerebral infarction: Secondary | ICD-10-CM | POA: Diagnosis not present

## 2021-01-25 DIAGNOSIS — E1165 Type 2 diabetes mellitus with hyperglycemia: Secondary | ICD-10-CM | POA: Diagnosis not present

## 2021-02-19 DIAGNOSIS — I10 Essential (primary) hypertension: Secondary | ICD-10-CM | POA: Diagnosis not present

## 2021-02-19 DIAGNOSIS — E1169 Type 2 diabetes mellitus with other specified complication: Secondary | ICD-10-CM | POA: Diagnosis not present

## 2021-02-28 ENCOUNTER — Ambulatory Visit: Payer: Medicare PPO | Admitting: Cardiology

## 2021-03-11 ENCOUNTER — Ambulatory Visit: Payer: Medicare PPO | Admitting: Cardiology

## 2021-04-01 ENCOUNTER — Other Ambulatory Visit: Payer: Self-pay | Admitting: Cardiology

## 2021-04-01 DIAGNOSIS — R42 Dizziness and giddiness: Secondary | ICD-10-CM

## 2021-04-01 DIAGNOSIS — I10 Essential (primary) hypertension: Secondary | ICD-10-CM

## 2021-04-02 DIAGNOSIS — H40023 Open angle with borderline findings, high risk, bilateral: Secondary | ICD-10-CM | POA: Diagnosis not present

## 2021-04-02 DIAGNOSIS — H2513 Age-related nuclear cataract, bilateral: Secondary | ICD-10-CM | POA: Diagnosis not present

## 2021-04-02 DIAGNOSIS — E119 Type 2 diabetes mellitus without complications: Secondary | ICD-10-CM | POA: Diagnosis not present

## 2021-04-10 ENCOUNTER — Ambulatory Visit: Payer: Medicare PPO | Admitting: Cardiology

## 2021-04-18 ENCOUNTER — Other Ambulatory Visit: Payer: Self-pay

## 2021-04-18 ENCOUNTER — Ambulatory Visit: Payer: Medicare PPO | Admitting: Cardiology

## 2021-04-18 ENCOUNTER — Encounter: Payer: Self-pay | Admitting: Cardiology

## 2021-04-18 VITALS — BP 145/84 | HR 50 | Resp 16 | Ht 70.0 in | Wt 159.8 lb

## 2021-04-18 DIAGNOSIS — I493 Ventricular premature depolarization: Secondary | ICD-10-CM

## 2021-04-18 DIAGNOSIS — E782 Mixed hyperlipidemia: Secondary | ICD-10-CM

## 2021-04-18 DIAGNOSIS — R001 Bradycardia, unspecified: Secondary | ICD-10-CM

## 2021-04-18 DIAGNOSIS — Z87891 Personal history of nicotine dependence: Secondary | ICD-10-CM

## 2021-04-18 DIAGNOSIS — I10 Essential (primary) hypertension: Secondary | ICD-10-CM | POA: Diagnosis not present

## 2021-04-18 NOTE — Progress Notes (Signed)
Date: 04/18/21 Last Office Visit: 12/28/2020  Chief Complaint  Patient presents with   Bradycardia   Follow-up    HPI  Darryl [name] is a 70 y.o. male who presents to the office with a chief complaint of " PVC management." Patient's past medical history and cardiac risk factors include: Diet-controlled diabetes mellitus type 2, hyperlipidemia, hypertension, advanced age.   Patient was initially referred to the office for shortness of breath and work-up was noted that he has higher than expected PVC burden of approximately 16% on the mobile cardiac ambulatory telemetry.  He was started on AV nodal blocking agents and then referred to cardiac electrophysiology for further evaluation.  He was placed on flecainide with up titration of the dose 200 mg p.o. twice daily.  However, patient has not tolerated flecainide well and therefore the medication is discontinued.  He has undergone an ischemic work-up as outlined below.  He now presents for 57-month follow-up visit.  Since last office visit patient states that he is doing well from a cardiovascular standpoint.  He denies chest pain, shortness of breath, tired, fatigue, near-syncope, or syncopal events.  He followed up with Dr. Ladona Ridgel on 12/2020 and no additional medications were initiated.  They did discuss undergoing PVC ablation or considering antiarrhythmic medication.  However, patient did not want to initiate any additional antiarrhythmic medications and since he is not significantly symptomatic due to his PVC burden ablation was not recommended.  Patient continues to walk a quarter of a mile once a week.  Patient states that he could do better but is not as motivated.  ALLERGIES: No Known Allergies  MEDICATION LIST PRIOR TO VISIT: Current Outpatient Medications on File Prior to Visit  Medication Sig Dispense Refill   aspirin EC 81 MG tablet Take 81 mg by mouth daily.     metoprolol tartrate (LOPRESSOR) 25 MG tablet TAKE 1 TABLET BY  MOUTH TWICE A DAY 60 tablet 6   nitroGLYCERIN (NITROSTAT) 0.4 MG SL tablet Place 1 tablet (0.4 mg total) under the tongue every 5 (five) minutes as needed for chest pain. If you require more than two tablets 5 mins apart call 911. 100 tablet 1   tadalafil (CIALIS) 5 MG tablet Take 5 mg by mouth daily.     telmisartan (MICARDIS) 40 MG tablet TAKE 1 TABLET BY MOUTH EVERY DAY IN THE MORNING 90 tablet 0   atorvastatin (LIPITOR) 20 MG tablet TAKE 1 TABLET BY MOUTH EVERYDAY AT BEDTIME 90 tablet 3   lamoTRIgine (LAMICTAL) 100 MG tablet Take 100 mg by mouth 2 (two) times daily. Take with Lamotrigine 25 mg to equal 125 mg     lamoTRIgine (LAMICTAL) 25 MG tablet Take 25 mg by mouth 2 (two) times daily.     No current facility-administered medications on file prior to visit.    PAST MEDICAL HISTORY: Past Medical History:  Diagnosis Date   Acute deep vein thrombosis (DVT) of left femoral vein (HCC) 07/01/2015   Anxiety    Diabetes mellitus without complication (HCC)    Epilepsy (HCC)    Erectile dysfunction    Hyperlipidemia    Hypertension    Seizures (HCC)     PAST SURGICAL HISTORY: Past Surgical History:  Procedure Laterality Date   CHOLECYSTECTOMY     IR GENERIC HISTORICAL  05/16/2016   IR ANGIO VERTEBRAL SEL VERTEBRAL UNI L MOD SED 05/16/2016 [facility]   IR GENERIC HISTORICAL  05/16/2016   IR ANGIOGRAM EXTREMITY RIGHT 05/16/2016 [facility]  IR GENERIC HISTORICAL  05/16/2016   IR ANGIO INTRA EXTRACRAN SEL COM CAROTID INNOMINATE UNI R MOD SED 05/16/2016 [facility]   KNEE SURGERY     LEFT HEART CATH AND CORONARY ANGIOGRAPHY N/A 08/04/2019   Procedure: LEFT HEART CATH AND CORONARY ANGIOGRAPHY;  Surgeon: Nigel Mormon, MD;  Location: [facility];  Service: Cardiovascular;  Laterality: N/A;   RADIOLOGY WITH ANESTHESIA N/A 06/11/2015   Procedure: RADIOLOGY WITH ANESTHESIA;  Surgeon: [name], MD;  Location: [facility];  Service: Radiology;  Laterality: N/A;     FAMILY HISTORY: The patient family history includes Alzheimer's disease in his mother; Aneurysm in his brother and sister; Diabetes in his father.   SOCIAL HISTORY:  The patient  reports that he quit smoking about 32 years ago. His smoking use included cigarettes. He has a 30.00 pack-year smoking history. He has never used smokeless tobacco. He reports that he does not drink alcohol and does not use drugs.  Review of Systems  Constitutional: Negative for chills and fever.  HENT:  Negative for hoarse voice and nosebleeds.   Eyes:  Negative for discharge, double vision and pain.  Cardiovascular:  Negative for chest pain, claudication, dyspnea on exertion, leg swelling, near-syncope, orthopnea, palpitations, paroxysmal nocturnal dyspnea and syncope.  Respiratory:  Negative for hemoptysis and shortness of breath.   Musculoskeletal:  Negative for muscle cramps and myalgias.  Gastrointestinal:  Negative for abdominal pain, constipation, diarrhea, hematemesis, hematochezia, melena, nausea and vomiting.  Neurological:  Negative for dizziness and light-headedness.    PHYSICAL EXAM: Vitals with BMI 04/18/2021 01/11/2021 12/28/2020  Height 5\' 10"  5\' 10"  5\' 11"   Weight 159 lbs 13 oz 162 lbs 13 oz 159 lbs  BMI 22.93 [id number] [id number]  Systolic [id number]  Diastolic 84 70 68  Pulse 50 53 38    CONSTITUTIONAL: Well-developed and well-nourished. No acute distress.  SKIN: Skin is warm and dry. No rash noted. No cyanosis. No pallor. No jaundice HEAD: Normocephalic and atraumatic.  EYES: No scleral icterus MOUTH/THROAT: Moist oral membranes.  NECK: No JVD present. No thyromegaly noted. No carotid bruits  LYMPHATIC: No visible cervical adenopathy.  CHEST Normal respiratory effort. No intercostal retractions  LUNGS: Clear to auscultation bilaterally.  No stridor. No wheezes. No rales.  CARDIOVASCULAR: Bradycardia, regular, positive S1-S2, no murmurs rubs or gallops appreciated ABDOMINAL: Nonobese,  soft, nontender, nondistended positive bowel sounds.  No apparent ascites.  EXTREMITIES: No peripheral edema  HEMATOLOGIC: No significant bruising NEUROLOGIC: Oriented to person, place, and time. Nonfocal. Normal muscle tone.  PSYCHIATRIC: Normal mood and affect. Normal behavior. Cooperative  CARDIAC DATABASE: EKG: 12/28/2020: Marked sinus bradycardia, 41 bpm, frequent PVCs.  Echocardiogram: 08/04/2019: Left ventricular ejection fraction, by estimation, is 65 to 70%. The left ventricle has normal function. The left ventricle has no regional wall motion abnormalities. Left ventricular diastolic parameters were normal. Right ventricular systolic function is normal. The right ventricular size is normal. Left atrial size was mildly dilated. Right atrial size was mildly dilated. IVC not well visualized. No signifciant valvular abnormality.  Stress Testing:  07/25/2020: Exercise treadmill stress test performed using Bruce protocol.  Patient reached 7 METS, and 84% of age predicted maximum heart rate.  Exercise capacity was low.  5/10 non-limiting chest pain reported.  Normal heart rate and hemodynamic response. Rest EKG showed sinus rhythm, no ST-T changesStress EKG showed sinus tachycardia, <1.5 mm up-sloping ST depressions in inferolateral leads, frequent PVC's that continued into recovery period as ventricular bigeminy. Abnormal treadmill stress  test with exercise induced PVC's.   Heart Catheterization: 08/04/2019: Right dominant circulation with tortuous, small-medium caliber vessels. TIMI III flow. No angiographically significant coronary artery disease. Normal LVEDP. Normal LVEF.  MCT 06/27/2019-07/26/2019: Minimum heart rate was noted to be 46 bpm on June 30, 2019 at 1:37 PM. Maximum heart rate was noted to be 120 bpm on July 14, 2019 at 6:19 PM. Average heart rate 70 bpm. No atrial fibrillation detected during the monitoring period. Ventricular ectopic burden reported to be 16%. Patient  triggered events illustrate underlying sinus mechanism and at times ventricular ectopy.  Please refer to the official report for more details.  LABORATORY DATA: CBC Latest Ref Rng & Units 01/11/2020 08/03/2019 07/14/2017  WBC 4.0 - 10.5 K/uL 5.4 6.3 -  Hemoglobin 13.0 - 17.0 g/dL 12.6(L) 14.1 17.0  Hematocrit 39.0 - 52.0 % 40.0 43.4 50.0  Platelets 150 - 400 K/uL 224 217 -    CMP Latest Ref Rng & Units 01/11/2020 01/10/2020 11/18/2019  Glucose 70 - 99 mg/dL 133(H) 113(H) 124(H)  BUN 8 - 23 mg/dL 17 16 16   Creatinine 0.61 - 1.24 mg/dL 1.13 1.25 1.15  Sodium 135 - 145 mmol/L 140 145(H) 147(H)  Potassium 3.5 - 5.1 mmol/L 4.0 4.5 4.8  Chloride 98 - 111 mmol/L 101 106 106  CO2 22 - 32 mmol/L 28 25 26   Calcium 8.9 - 10.3 mg/dL 9.6 10.2 10.0  Total Protein 6.5 - 8.1 g/dL - - -  Total Bilirubin 0.3 - 1.2 mg/dL - - -  Alkaline Phos 38 - 126 U/L - - -  AST 15 - 41 U/L - - -  ALT 17 - 63 U/L - - -    External Labs: Collected: 06/13/2018 Lipid profile: Total cholesterol 220, triglycerides 69, HDL 72, LDL 132 Hemoglobin A1c: 6.5  12/31/2018 TSH: 1.63  07/2017 serum creatinine 1.4 mg/dL.  FINAL MEDICATION LIST END OF ENCOUNTER: No orders of the defined types were placed in this encounter.  There are no discontinued medications.    Current Outpatient Medications:    aspirin EC 81 MG tablet, Take 81 mg by mouth daily., Disp: , Rfl:    metoprolol tartrate (LOPRESSOR) 25 MG tablet, TAKE 1 TABLET BY MOUTH TWICE A DAY, Disp: 60 tablet, Rfl: 6   nitroGLYCERIN (NITROSTAT) 0.4 MG SL tablet, Place 1 tablet (0.4 mg total) under the tongue every 5 (five) minutes as needed for chest pain. If you require more than two tablets 5 mins apart call 911., Disp: 100 tablet, Rfl: 1   tadalafil (CIALIS) 5 MG tablet, Take 5 mg by mouth daily., Disp: , Rfl:    telmisartan (MICARDIS) 40 MG tablet, TAKE 1 TABLET BY MOUTH EVERY DAY IN THE MORNING, Disp: 90 tablet, Rfl: 0   atorvastatin (LIPITOR) 20 MG tablet, TAKE 1  TABLET BY MOUTH EVERYDAY AT BEDTIME, Disp: 90 tablet, Rfl: 3   lamoTRIgine (LAMICTAL) 100 MG tablet, Take 100 mg by mouth 2 (two) times daily. Take with Lamotrigine 25 mg to equal 125 mg, Disp: , Rfl:    lamoTRIgine (LAMICTAL) 25 MG tablet, Take 25 mg by mouth 2 (two) times daily., Disp: , Rfl:   IMPRESSION:    ICD-10-CM   1. Bradycardia  R00.1     2. PVC (premature ventricular contraction)  I49.3     3. Essential hypertension  I10     4. Mixed hyperlipidemia  E78.2     5. Former smoker  Z87.891        RECOMMENDATIONS: Jonathon Bellows  Brouse is a 70 y.o. male whose past medical history and cardiac risk factors include: Diet-controlled diabetes mellitus type 2, hyperlipidemia, hypertension, advanced age.   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 Continue Lopressor Denies fatigue, tiredness, near-syncope or syncopal events. Did try flecainide in the past but did not tolerate it. He has been referred to EP for additional management and chooses not to be on antiarrhythmic medications and since his PVC burden is not symptomatic PVC ablation was not recommended at this time.  Essential hypertension Office blood pressure is within acceptable range. Medications were not able to be reconciled accurately as he does not know what he is taking by memory.  This has happened in the past and he is recommended to either carry a list or bring his cardiac medication bottles in to actually perform medication reconciliation.  But he fails to do so. Management deferred to primary team at this time.  Mixed hyperlipidemia: Continue Lipitor 20 mg p.o. nightly. Patient tolerated the medication well without any side effects or intolerances.  Currently managed by primary care provider.  No orders of the defined types were placed in this encounter.   --Continue cardiac medications as reconciled in final medication list. --Return in  about 1 year (around 04/18/2022) for Follow up Bradycardia, PVCs. .. Or sooner if needed. --Continue follow-up with your primary care physician regarding the management of your other chronic comorbid conditions.  Patient's questions and concerns were addressed to his satisfaction. He voices understanding of the instructions provided during this encounter.   This note was created using a voice recognition software as a result there may be grammatical errors inadvertently enclosed that do not reflect the nature of this encounter. Every attempt is made to correct such errors.  Rex Kras, [place], [facility]  Pager: [phone] Office: [phone]

## 2021-04-23 ENCOUNTER — Telehealth: Payer: Self-pay

## 2021-04-23 DIAGNOSIS — E119 Type 2 diabetes mellitus without complications: Secondary | ICD-10-CM | POA: Diagnosis not present

## 2021-04-23 DIAGNOSIS — M47812 Spondylosis without myelopathy or radiculopathy, cervical region: Secondary | ICD-10-CM | POA: Diagnosis not present

## 2021-04-23 DIAGNOSIS — R001 Bradycardia, unspecified: Secondary | ICD-10-CM | POA: Diagnosis not present

## 2021-04-23 DIAGNOSIS — R7989 Other specified abnormal findings of blood chemistry: Secondary | ICD-10-CM | POA: Diagnosis not present

## 2021-04-23 DIAGNOSIS — R42 Dizziness and giddiness: Secondary | ICD-10-CM | POA: Diagnosis not present

## 2021-04-23 DIAGNOSIS — R0789 Other chest pain: Secondary | ICD-10-CM | POA: Diagnosis not present

## 2021-04-23 DIAGNOSIS — I6782 Cerebral ischemia: Secondary | ICD-10-CM | POA: Diagnosis not present

## 2021-04-23 DIAGNOSIS — I10 Essential (primary) hypertension: Secondary | ICD-10-CM | POA: Diagnosis not present

## 2021-04-23 DIAGNOSIS — I639 Cerebral infarction, unspecified: Secondary | ICD-10-CM | POA: Diagnosis not present

## 2021-04-23 DIAGNOSIS — I6501 Occlusion and stenosis of right vertebral artery: Secondary | ICD-10-CM | POA: Diagnosis not present

## 2021-04-23 DIAGNOSIS — I119 Hypertensive heart disease without heart failure: Secondary | ICD-10-CM | POA: Diagnosis not present

## 2021-04-23 DIAGNOSIS — R079 Chest pain, unspecified: Secondary | ICD-10-CM | POA: Diagnosis not present

## 2021-04-24 NOTE — Telephone Encounter (Signed)
Called to inform him pt will see Celeste at 1:30 11/30

## 2021-04-24 NOTE — Telephone Encounter (Signed)
To my knowledge he did not go to [facility].  Have him come in to see either me or Celeste next week.

## 2021-05-01 ENCOUNTER — Other Ambulatory Visit: Payer: Self-pay

## 2021-05-01 ENCOUNTER — Ambulatory Visit: Payer: Medicare PPO | Admitting: Student

## 2021-05-01 ENCOUNTER — Encounter: Payer: Self-pay | Admitting: Student

## 2021-05-01 VITALS — BP 138/77 | HR 52 | Temp 97.8°F | Resp 16 | Ht 70.0 in | Wt 160.0 lb

## 2021-05-01 DIAGNOSIS — R001 Bradycardia, unspecified: Secondary | ICD-10-CM | POA: Diagnosis not present

## 2021-05-01 DIAGNOSIS — I10 Essential (primary) hypertension: Secondary | ICD-10-CM

## 2021-05-01 DIAGNOSIS — I493 Ventricular premature depolarization: Secondary | ICD-10-CM

## 2021-05-01 NOTE — Progress Notes (Signed)
Date: 05/01/21 Last Office Visit: 12/28/2020  Chief Complaint  Patient presents with   Bradycardia   Follow-up   pvc    HPI  Darryl [name] is a 70 y.o. male who presents to the office with a chief complaint of " PVC management." Patient's past medical history and cardiac risk factors include: Diet-controlled diabetes mellitus type 2, hyperlipidemia, hypertension, advanced age.   Patient was initially referred to the office for shortness of breath and work-up was noted that he has higher than expected PVC burden of approximately 16% on the mobile cardiac ambulatory telemetry.  He was started on AV nodal blocking agents and then referred to cardiac electrophysiology for further evaluation.  He was placed on flecainide with up titration of the dose 200 mg p.o. twice daily.  However, patient has not tolerated flecainide well and therefore the medication is discontinued.  He has undergone an ischemic work-up as outlined below.  Patient presents for urgent visit today after evaluation in the emergency department 04/23/2021 with dizziness.  Patient's EKG at that time noted sinus bradycardia without PVCs.  Work-up was otherwise unremarkable and patient's symptoms resolved in the emergency department.  At that time ED providers discontinued metoprolol and switch patient to hydrochlorothiazide for management of hypertension.  Patient now presents for follow-up.  Unfortunately patient did not bring medications with him today's office visit, and therefore it is unclear what medications he is taking.  Patient reports he believes he has discontinued metoprolol, but reports he may be taking flecainide, it is unclear.  Patient is presently feeling well, relatively asymptomatic.  He has had no recurrence of dizziness.  Denies chest pain, palpitations, syncope, near syncope.  ALLERGIES: No Known Allergies  MEDICATION LIST PRIOR TO VISIT: Current Outpatient Medications on File Prior to Visit  Medication  Sig Dispense Refill   aspirin EC 81 MG tablet Take 81 mg by mouth daily.     atorvastatin (LIPITOR) 20 MG tablet TAKE 1 TABLET BY MOUTH EVERYDAY AT BEDTIME 90 tablet 3   hydrochlorothiazide (HYDRODIURIL) 25 MG tablet Take by mouth.     lamoTRIgine (LAMICTAL) 100 MG tablet Take 100 mg by mouth 2 (two) times daily. Take with Lamotrigine 25 mg to equal 125 mg     lamoTRIgine (LAMICTAL) 25 MG tablet Take 25 mg by mouth 2 (two) times daily.     metoprolol tartrate (LOPRESSOR) 25 MG tablet TAKE 1 TABLET BY MOUTH TWICE A DAY 60 tablet 6   nitroGLYCERIN (NITROSTAT) 0.4 MG SL tablet Place 1 tablet (0.4 mg total) under the tongue every 5 (five) minutes as needed for chest pain. If you require more than two tablets 5 mins apart call 911. 100 tablet 1   tadalafil (CIALIS) 5 MG tablet Take 5 mg by mouth daily.     telmisartan (MICARDIS) 40 MG tablet TAKE 1 TABLET BY MOUTH EVERY DAY IN THE MORNING 90 tablet 0   No current facility-administered medications on file prior to visit.    PAST MEDICAL HISTORY: Past Medical History:  Diagnosis Date   Acute deep vein thrombosis (DVT) of left femoral vein (HCC) 07/01/2015   Anxiety    Diabetes mellitus without complication (HCC)    Epilepsy (HCC)    Erectile dysfunction    Hyperlipidemia    Hypertension    Seizures ([place])     PAST SURGICAL HISTORY: Past Surgical History:  Procedure Laterality Date   CHOLECYSTECTOMY     IR GENERIC HISTORICAL  05/16/2016   IR ANGIO VERTEBRAL SEL VERTEBRAL  UNI L MOD SED 05/16/2016 [facility]   IR GENERIC HISTORICAL  05/16/2016   IR ANGIOGRAM EXTREMITY RIGHT 05/16/2016 [facility]   IR GENERIC HISTORICAL  05/16/2016   IR ANGIO INTRA EXTRACRAN SEL COM CAROTID INNOMINATE UNI R MOD SED 05/16/2016 [facility]   KNEE SURGERY     LEFT HEART CATH AND CORONARY ANGIOGRAPHY N/A 08/04/2019   Procedure: LEFT HEART CATH AND CORONARY ANGIOGRAPHY;  Surgeon: Elder NegusPatwardhan, Manish J, MD;  Location: [facility];  Service:  Cardiovascular;  Laterality: N/A;   RADIOLOGY WITH ANESTHESIA N/A 06/11/2015   Procedure: RADIOLOGY WITH ANESTHESIA;  Surgeon: Lisbeth RenshawNeelesh Nundkumar, MD;  Location: [facility] OR;  Service: Radiology;  Laterality: N/A;    FAMILY HISTORY: The patient family history includes Alzheimer's disease in his mother; Aneurysm in his brother and sister; Diabetes in his father.   SOCIAL HISTORY:  The patient  reports that he quit smoking about 32 years ago. His smoking use included cigarettes. He has a 30.00 pack-year smoking history. He has never used smokeless tobacco. He reports that he does not drink alcohol and does not use drugs.  Review of Systems  Constitutional: Negative for chills and fever.  HENT:  Negative for hoarse voice and nosebleeds.   Eyes:  Negative for discharge, double vision and pain.  Cardiovascular:  Negative for chest pain, claudication, dyspnea on exertion, leg swelling, near-syncope, orthopnea, palpitations, paroxysmal nocturnal dyspnea and syncope.  Respiratory:  Negative for hemoptysis and shortness of breath.   Musculoskeletal:  Negative for muscle cramps and myalgias.  Gastrointestinal:  Negative for abdominal pain, constipation, diarrhea, hematemesis, hematochezia, melena, nausea and vomiting.  Neurological:  Negative for dizziness (no recurrence) and light-headedness.    PHYSICAL EXAM: Vitals with BMI 05/01/2021 05/01/2021 04/18/2021  Height - 5\' 10"  5\' 10"   Weight - 160 lbs 159 lbs 13 oz  BMI - 22.96 22.93  Systolic 138 148 [id number]  Diastolic 77 75 84  Pulse 52 52 50    CONSTITUTIONAL: Well-developed and well-nourished. No acute distress.  SKIN: Skin is warm and dry. No rash noted. No cyanosis. No pallor. No jaundice HEAD: Normocephalic and atraumatic.  EYES: No scleral icterus MOUTH/THROAT: Moist oral membranes.  NECK: No JVD present. No thyromegaly noted. No carotid bruits  LYMPHATIC: No visible cervical adenopathy.  CHEST Normal respiratory effort. No intercostal  retractions  LUNGS: Clear to auscultation bilaterally.  No stridor. No wheezes. No rales.  CARDIOVASCULAR: Bradycardia, regular, positive S1-S2, no murmurs rubs or gallops appreciated ABDOMINAL: Nonobese, soft, nontender, nondistended positive bowel sounds.  No apparent ascites.  EXTREMITIES: No peripheral edema  HEMATOLOGIC: No significant bruising NEUROLOGIC: Oriented to person, place, and time. Nonfocal. Normal muscle tone.  PSYCHIATRIC: Normal mood and affect. Normal behavior. Cooperative  CARDIAC DATABASE: EKG: 12/28/2020: Marked sinus bradycardia, 41 bpm, frequent PVCs. 05/01/2021: Sinus bradycardia rate of 50 bpm.  Normal axis.  No evidence of ischemia or Injury pattern.  Echocardiogram: 08/04/2019: Left ventricular ejection fraction, by estimation, is 65 to 70%. The left ventricle has normal function. The left ventricle has no regional wall motion abnormalities. Left ventricular diastolic parameters were normal. Right ventricular systolic function is normal. The right ventricular size is normal. Left atrial size was mildly dilated. Right atrial size was mildly dilated. IVC not well visualized. No signifciant valvular abnormality.  Stress Testing:  07/25/2020: Exercise treadmill stress test performed using Bruce protocol.  Patient reached 7 METS, and 84% of age predicted maximum heart rate.  Exercise capacity was low.  5/10 non-limiting chest pain  reported.  Normal heart rate and hemodynamic response. Rest EKG showed sinus rhythm, no ST-T changesStress EKG showed sinus tachycardia, <1.5 mm up-sloping ST depressions in inferolateral leads, frequent PVC's that continued into recovery period as ventricular bigeminy. Abnormal treadmill stress test with exercise induced PVC's.   Heart Catheterization: 08/04/2019: Right dominant circulation with tortuous, small-medium caliber vessels. TIMI III flow. No angiographically significant coronary artery disease. Normal LVEDP. Normal LVEF.  MCT  06/27/2019-07/26/2019: Minimum heart rate was noted to be 46 bpm on June 30, 2019 at 1:37 PM. Maximum heart rate was noted to be 120 bpm on July 14, 2019 at 6:19 PM. Average heart rate 70 bpm. No atrial fibrillation detected during the monitoring period. Ventricular ectopic burden reported to be 16%. Patient triggered events illustrate underlying sinus mechanism and at times ventricular ectopy.  Please refer to the official report for more details.  LABORATORY DATA: CBC Latest Ref Rng & Units 01/11/2020 08/03/2019 07/14/2017  WBC 4.0 - 10.5 K/uL 5.4 6.3 -  Hemoglobin 13.0 - 17.0 g/dL 12.6(L) 14.1 17.0  Hematocrit 39.0 - 52.0 % 40.0 43.4 50.0  Platelets 150 - 400 K/uL 224 217 -    CMP Latest Ref Rng & Units 01/11/2020 01/10/2020 11/18/2019  Glucose 70 - 99 mg/dL 133(H) 113(H) 124(H)  BUN 8 - 23 mg/dL 17 16 16   Creatinine 0.61 - 1.24 mg/dL 1.13 1.25 1.15  Sodium 135 - 145 mmol/L 140 145(H) 147(H)  Potassium 3.5 - 5.1 mmol/L 4.0 4.5 4.8  Chloride 98 - 111 mmol/L 101 106 106  CO2 22 - 32 mmol/L 28 25 26   Calcium 8.9 - 10.3 mg/dL 9.6 10.2 10.0  Total Protein 6.5 - 8.1 g/dL - - -  Total Bilirubin 0.3 - 1.2 mg/dL - - -  Alkaline Phos 38 - 126 U/L - - -  AST 15 - 41 U/L - - -  ALT 17 - 63 U/L - - -    External Labs: Collected: 06/13/2018 Lipid profile: Total cholesterol 220, triglycerides 69, HDL 72, LDL 132 Hemoglobin A1c: 6.5  12/31/2018 TSH: 1.63  07/2017 serum creatinine 1.4 mg/dL.  FINAL MEDICATION LIST END OF ENCOUNTER: No orders of the defined types were placed in this encounter.  There are no discontinued medications.    Current Outpatient Medications:    aspirin EC 81 MG tablet, Take 81 mg by mouth daily., Disp: , Rfl:    atorvastatin (LIPITOR) 20 MG tablet, TAKE 1 TABLET BY MOUTH EVERYDAY AT BEDTIME, Disp: 90 tablet, Rfl: 3   hydrochlorothiazide (HYDRODIURIL) 25 MG tablet, Take by mouth., Disp: , Rfl:    lamoTRIgine (LAMICTAL) 100 MG tablet, Take 100 mg by mouth 2  (two) times daily. Take with Lamotrigine 25 mg to equal 125 mg, Disp: , Rfl:    lamoTRIgine (LAMICTAL) 25 MG tablet, Take 25 mg by mouth 2 (two) times daily., Disp: , Rfl:    metoprolol tartrate (LOPRESSOR) 25 MG tablet, TAKE 1 TABLET BY MOUTH TWICE A DAY, Disp: 60 tablet, Rfl: 6   nitroGLYCERIN (NITROSTAT) 0.4 MG SL tablet, Place 1 tablet (0.4 mg total) under the tongue every 5 (five) minutes as needed for chest pain. If you require more than two tablets 5 mins apart call 911., Disp: 100 tablet, Rfl: 1   tadalafil (CIALIS) 5 MG tablet, Take 5 mg by mouth daily., Disp: , Rfl:    telmisartan (MICARDIS) 40 MG tablet, TAKE 1 TABLET BY MOUTH EVERY DAY IN THE MORNING, Disp: 90 tablet, Rfl: 0  IMPRESSION:  ICD-10-CM   1. Bradycardia  R00.1 EKG 12-Lead    2. PVC (premature ventricular contraction)  I49.3     3. Essential hypertension  I10        RECOMMENDATIONS: Rafik Koppel is a 70 y.o. male whose past medical history and cardiac risk factors include: Diet-controlled diabetes mellitus type 2, hyperlipidemia, hypertension, advanced age.   Bradycardia /premature ventricular contractions: Asymptomatic bradycardia  EKG without PVCs today. Unclear whether patient is taking metoprolol and/or flecainide at this time.  Patient will call our office to complete medication reconciliation. Will not make changes at this time as he is asymptomatic and no frequent PVCs noted on EKG. Physical exam remained stable. He has been referred to EP for additional management and since his PVC burden is not symptomatic PVC ablation was not recommended at this time.  Essential hypertension Office blood pressure is within acceptable range. According to recent ED visit notes patient was switched to hydrochlorothiazide for management of hypertension.  Patient plans to follow-up with PCP for repeat lab testing, he is aware of need for repeat BMP due to initiation of HCTZ Management deferred to primary team at this  time.  Mixed hyperlipidemia: Continue Lipitor 20 mg p.o. nightly. Defer further management to PCP  Notably unable to complete medications reconciliation accurately as he does not know what he is taking by memory on despite recommending patient carry a list or bring medication bottles to the visit he feels to do so.  Unfortunately this has happened multiple times, which makes medication management difficult.   Orders Placed This Encounter  Procedures   EKG 12-Lead     --Continue cardiac medications as reconciled in final medication list. --No follow-ups on file.  Patient will follow-up as previously scheduled with Dr. Odis Hollingshead in 04/2022, or sooner if needed. --Continue follow-up with your primary care physician regarding the management of your other chronic comorbid conditions.  Patient's questions and concerns were addressed to his satisfaction. He voices understanding of the instructions provided during this encounter.   This note was created using a voice recognition software as a result there may be grammatical errors inadvertently enclosed that do not reflect the nature of this encounter. Every attempt is made to correct such errors.  I personally reviewed external records from recent ED visit.    Rayford Halsted, PA-C 05/01/2021, 1:55 PM Office: [phone]

## 2021-05-06 DIAGNOSIS — Z7982 Long term (current) use of aspirin: Secondary | ICD-10-CM | POA: Diagnosis not present

## 2021-05-06 DIAGNOSIS — G40909 Epilepsy, unspecified, not intractable, without status epilepticus: Secondary | ICD-10-CM | POA: Diagnosis not present

## 2021-05-06 DIAGNOSIS — F321 Major depressive disorder, single episode, moderate: Secondary | ICD-10-CM | POA: Diagnosis not present

## 2021-05-06 DIAGNOSIS — F32A Depression, unspecified: Secondary | ICD-10-CM | POA: Diagnosis not present

## 2021-05-06 DIAGNOSIS — I6381 Other cerebral infarction due to occlusion or stenosis of small artery: Secondary | ICD-10-CM | POA: Diagnosis not present

## 2021-05-06 DIAGNOSIS — Z87891 Personal history of nicotine dependence: Secondary | ICD-10-CM | POA: Diagnosis not present

## 2021-05-06 DIAGNOSIS — Z79899 Other long term (current) drug therapy: Secondary | ICD-10-CM | POA: Diagnosis not present

## 2021-05-06 DIAGNOSIS — Z8669 Personal history of other diseases of the nervous system and sense organs: Secondary | ICD-10-CM | POA: Diagnosis not present

## 2021-05-06 DIAGNOSIS — I10 Essential (primary) hypertension: Secondary | ICD-10-CM | POA: Diagnosis not present

## 2021-05-06 DIAGNOSIS — F419 Anxiety disorder, unspecified: Secondary | ICD-10-CM | POA: Diagnosis not present

## 2021-05-06 DIAGNOSIS — I69311 Memory deficit following cerebral infarction: Secondary | ICD-10-CM | POA: Diagnosis not present

## 2021-05-06 DIAGNOSIS — F411 Generalized anxiety disorder: Secondary | ICD-10-CM | POA: Diagnosis not present

## 2021-05-21 DIAGNOSIS — E785 Hyperlipidemia, unspecified: Secondary | ICD-10-CM | POA: Diagnosis not present

## 2021-05-21 DIAGNOSIS — I10 Essential (primary) hypertension: Secondary | ICD-10-CM | POA: Diagnosis not present

## 2021-05-21 DIAGNOSIS — R42 Dizziness and giddiness: Secondary | ICD-10-CM | POA: Diagnosis not present

## 2021-05-21 DIAGNOSIS — E1169 Type 2 diabetes mellitus with other specified complication: Secondary | ICD-10-CM | POA: Diagnosis not present

## 2021-05-21 DIAGNOSIS — E782 Mixed hyperlipidemia: Secondary | ICD-10-CM | POA: Diagnosis not present

## 2021-05-21 DIAGNOSIS — F411 Generalized anxiety disorder: Secondary | ICD-10-CM | POA: Diagnosis not present

## 2021-06-26 DIAGNOSIS — F411 Generalized anxiety disorder: Secondary | ICD-10-CM | POA: Diagnosis not present

## 2021-06-26 DIAGNOSIS — E1169 Type 2 diabetes mellitus with other specified complication: Secondary | ICD-10-CM | POA: Diagnosis not present

## 2021-06-26 DIAGNOSIS — M810 Age-related osteoporosis without current pathological fracture: Secondary | ICD-10-CM | POA: Diagnosis not present

## 2021-06-26 DIAGNOSIS — I10 Essential (primary) hypertension: Secondary | ICD-10-CM | POA: Diagnosis not present

## 2021-06-27 DIAGNOSIS — R001 Bradycardia, unspecified: Secondary | ICD-10-CM | POA: Diagnosis not present

## 2021-06-27 DIAGNOSIS — I493 Ventricular premature depolarization: Secondary | ICD-10-CM | POA: Diagnosis not present

## 2021-07-12 ENCOUNTER — Other Ambulatory Visit: Payer: Self-pay | Admitting: Cardiology

## 2021-08-27 DIAGNOSIS — G4089 Other seizures: Secondary | ICD-10-CM | POA: Diagnosis not present

## 2021-08-27 DIAGNOSIS — I10 Essential (primary) hypertension: Secondary | ICD-10-CM | POA: Diagnosis not present

## 2021-08-27 DIAGNOSIS — R5383 Other fatigue: Secondary | ICD-10-CM | POA: Diagnosis not present

## 2021-08-27 DIAGNOSIS — Z6821 Body mass index (BMI) 21.0-21.9, adult: Secondary | ICD-10-CM | POA: Diagnosis not present

## 2021-08-27 DIAGNOSIS — R001 Bradycardia, unspecified: Secondary | ICD-10-CM | POA: Diagnosis not present

## 2021-08-27 DIAGNOSIS — F411 Generalized anxiety disorder: Secondary | ICD-10-CM | POA: Diagnosis not present

## 2021-08-27 DIAGNOSIS — E1169 Type 2 diabetes mellitus with other specified complication: Secondary | ICD-10-CM | POA: Diagnosis not present

## 2021-09-05 DIAGNOSIS — R3912 Poor urinary stream: Secondary | ICD-10-CM | POA: Diagnosis not present

## 2021-09-05 DIAGNOSIS — N401 Enlarged prostate with lower urinary tract symptoms: Secondary | ICD-10-CM | POA: Diagnosis not present

## 2021-09-05 DIAGNOSIS — N5201 Erectile dysfunction due to arterial insufficiency: Secondary | ICD-10-CM | POA: Diagnosis not present

## 2021-09-25 ENCOUNTER — Telehealth: Payer: Self-pay

## 2021-09-25 NOTE — Telephone Encounter (Signed)
Pt brought in medication list. Reviewed meds and updated MAR.  ?

## 2021-10-01 DIAGNOSIS — H40023 Open angle with borderline findings, high risk, bilateral: Secondary | ICD-10-CM | POA: Diagnosis not present

## 2021-10-01 DIAGNOSIS — H2513 Age-related nuclear cataract, bilateral: Secondary | ICD-10-CM | POA: Diagnosis not present

## 2021-10-01 DIAGNOSIS — E119 Type 2 diabetes mellitus without complications: Secondary | ICD-10-CM | POA: Diagnosis not present

## 2021-10-01 DIAGNOSIS — H43812 Vitreous degeneration, left eye: Secondary | ICD-10-CM | POA: Diagnosis not present

## 2021-10-17 ENCOUNTER — Other Ambulatory Visit: Payer: Self-pay | Admitting: Cardiology

## 2021-10-17 DIAGNOSIS — R42 Dizziness and giddiness: Secondary | ICD-10-CM

## 2021-10-17 DIAGNOSIS — I10 Essential (primary) hypertension: Secondary | ICD-10-CM

## 2021-12-17 DIAGNOSIS — K1370 Unspecified lesions of oral mucosa: Secondary | ICD-10-CM | POA: Diagnosis not present

## 2021-12-25 DIAGNOSIS — K1329 Other disturbances of oral epithelium, including tongue: Secondary | ICD-10-CM | POA: Diagnosis not present

## 2022-01-10 ENCOUNTER — Other Ambulatory Visit: Payer: Self-pay | Admitting: Cardiology

## 2022-01-10 DIAGNOSIS — R42 Dizziness and giddiness: Secondary | ICD-10-CM

## 2022-01-10 DIAGNOSIS — I10 Essential (primary) hypertension: Secondary | ICD-10-CM

## 2022-02-11 DIAGNOSIS — I10 Essential (primary) hypertension: Secondary | ICD-10-CM | POA: Diagnosis not present

## 2022-02-11 DIAGNOSIS — M940 Chondrocostal junction syndrome [Tietze]: Secondary | ICD-10-CM | POA: Diagnosis not present

## 2022-02-11 DIAGNOSIS — E782 Mixed hyperlipidemia: Secondary | ICD-10-CM | POA: Diagnosis not present

## 2022-02-11 DIAGNOSIS — E1169 Type 2 diabetes mellitus with other specified complication: Secondary | ICD-10-CM | POA: Diagnosis not present

## 2022-02-24 DIAGNOSIS — I10 Essential (primary) hypertension: Secondary | ICD-10-CM | POA: Diagnosis not present

## 2022-02-24 DIAGNOSIS — M940 Chondrocostal junction syndrome [Tietze]: Secondary | ICD-10-CM | POA: Diagnosis not present

## 2022-03-17 ENCOUNTER — Other Ambulatory Visit: Payer: Self-pay

## 2022-03-17 DIAGNOSIS — R42 Dizziness and giddiness: Secondary | ICD-10-CM

## 2022-03-17 DIAGNOSIS — I10 Essential (primary) hypertension: Secondary | ICD-10-CM

## 2022-03-17 MED ORDER — ATORVASTATIN CALCIUM 20 MG PO TABS
ORAL_TABLET | ORAL | 1 refills | Status: DC
Start: 2022-03-17 — End: 2022-10-08

## 2022-03-17 MED ORDER — TELMISARTAN 40 MG PO TABS: ORAL_TABLET | ORAL | 0 refills | Status: DC

## 2022-03-26 ENCOUNTER — Ambulatory Visit: Payer: Self-pay

## 2022-03-26 DIAGNOSIS — I10 Essential (primary) hypertension: Secondary | ICD-10-CM | POA: Diagnosis not present

## 2022-03-26 DIAGNOSIS — E1169 Type 2 diabetes mellitus with other specified complication: Secondary | ICD-10-CM | POA: Diagnosis not present

## 2022-03-26 NOTE — Patient Outreach (Signed)
  Care Coordination   03/26/2022 Name: Darryl [name] MRN: [id number] DOB: 1950-10-17   Care Coordination Outreach Attempts:  An unsuccessful telephone outreach was attempted today to offer the patient information about available care coordination services as a benefit of their health plan.   Follow Up Plan:  Additional outreach attempts will be made to offer the patient care coordination information and services.   Encounter Outcome:  No Answer  Care Coordination Interventions Activated:  No   Care Coordination Interventions:  No, not indicated    Daneen Schick, BSW, CDP Social Worker, Certified Dementia Practitioner [facility] Care Management  Care Coordination [phone]

## 2022-04-10 DIAGNOSIS — R69 Illness, unspecified: Secondary | ICD-10-CM | POA: Diagnosis not present

## 2022-04-17 DIAGNOSIS — G4089 Other seizures: Secondary | ICD-10-CM | POA: Diagnosis not present

## 2022-04-17 DIAGNOSIS — I10 Essential (primary) hypertension: Secondary | ICD-10-CM | POA: Diagnosis not present

## 2022-04-17 DIAGNOSIS — E1169 Type 2 diabetes mellitus with other specified complication: Secondary | ICD-10-CM | POA: Diagnosis not present

## 2022-04-17 DIAGNOSIS — R001 Bradycardia, unspecified: Secondary | ICD-10-CM | POA: Diagnosis not present

## 2022-04-17 DIAGNOSIS — I729 Aneurysm of unspecified site: Secondary | ICD-10-CM | POA: Diagnosis not present

## 2022-04-17 DIAGNOSIS — I509 Heart failure, unspecified: Secondary | ICD-10-CM | POA: Diagnosis not present

## 2022-04-17 DIAGNOSIS — E039 Hypothyroidism, unspecified: Secondary | ICD-10-CM | POA: Diagnosis not present

## 2022-04-17 DIAGNOSIS — I499 Cardiac arrhythmia, unspecified: Secondary | ICD-10-CM | POA: Diagnosis not present

## 2022-04-17 DIAGNOSIS — R42 Dizziness and giddiness: Secondary | ICD-10-CM | POA: Diagnosis not present

## 2022-04-17 DIAGNOSIS — R413 Other amnesia: Secondary | ICD-10-CM | POA: Diagnosis not present

## 2022-04-18 ENCOUNTER — Ambulatory Visit: Payer: Medicare PPO | Admitting: Cardiology

## 2022-05-01 DIAGNOSIS — E1169 Type 2 diabetes mellitus with other specified complication: Secondary | ICD-10-CM | POA: Diagnosis not present

## 2022-05-01 DIAGNOSIS — R569 Unspecified convulsions: Secondary | ICD-10-CM | POA: Diagnosis not present

## 2022-05-01 DIAGNOSIS — I10 Essential (primary) hypertension: Secondary | ICD-10-CM | POA: Diagnosis not present

## 2022-05-05 DIAGNOSIS — I493 Ventricular premature depolarization: Secondary | ICD-10-CM | POA: Diagnosis not present

## 2022-05-05 DIAGNOSIS — R079 Chest pain, unspecified: Secondary | ICD-10-CM | POA: Diagnosis not present

## 2022-05-05 DIAGNOSIS — I495 Sick sinus syndrome: Secondary | ICD-10-CM | POA: Diagnosis not present

## 2022-05-06 DIAGNOSIS — I492 Junctional premature depolarization: Secondary | ICD-10-CM | POA: Diagnosis not present

## 2022-05-09 DIAGNOSIS — I10 Essential (primary) hypertension: Secondary | ICD-10-CM | POA: Diagnosis not present

## 2022-06-05 DIAGNOSIS — R001 Bradycardia, unspecified: Secondary | ICD-10-CM | POA: Diagnosis not present

## 2022-06-07 DIAGNOSIS — I493 Ventricular premature depolarization: Secondary | ICD-10-CM | POA: Diagnosis not present

## 2022-06-25 DIAGNOSIS — I10 Essential (primary) hypertension: Secondary | ICD-10-CM | POA: Diagnosis not present

## 2022-06-25 DIAGNOSIS — R079 Chest pain, unspecified: Secondary | ICD-10-CM | POA: Diagnosis not present

## 2022-06-25 DIAGNOSIS — I491 Atrial premature depolarization: Secondary | ICD-10-CM | POA: Diagnosis not present

## 2022-06-25 DIAGNOSIS — R001 Bradycardia, unspecified: Secondary | ICD-10-CM | POA: Diagnosis not present

## 2022-06-25 DIAGNOSIS — R0789 Other chest pain: Secondary | ICD-10-CM | POA: Diagnosis not present

## 2022-06-26 DIAGNOSIS — I491 Atrial premature depolarization: Secondary | ICD-10-CM | POA: Diagnosis not present

## 2022-07-01 DIAGNOSIS — R001 Bradycardia, unspecified: Secondary | ICD-10-CM | POA: Diagnosis not present

## 2022-07-08 ENCOUNTER — Other Ambulatory Visit: Payer: Self-pay | Admitting: Cardiology

## 2022-07-08 DIAGNOSIS — I10 Essential (primary) hypertension: Secondary | ICD-10-CM

## 2022-07-08 DIAGNOSIS — R42 Dizziness and giddiness: Secondary | ICD-10-CM

## 2022-07-14 DIAGNOSIS — Z9889 Other specified postprocedural states: Secondary | ICD-10-CM | POA: Diagnosis not present

## 2022-07-14 DIAGNOSIS — R0789 Other chest pain: Secondary | ICD-10-CM | POA: Diagnosis not present

## 2022-07-14 DIAGNOSIS — Z79899 Other long term (current) drug therapy: Secondary | ICD-10-CM | POA: Diagnosis not present

## 2022-07-14 DIAGNOSIS — I493 Ventricular premature depolarization: Secondary | ICD-10-CM | POA: Diagnosis not present

## 2022-07-14 DIAGNOSIS — R079 Chest pain, unspecified: Secondary | ICD-10-CM | POA: Diagnosis not present

## 2022-07-14 DIAGNOSIS — Z87891 Personal history of nicotine dependence: Secondary | ICD-10-CM | POA: Diagnosis not present

## 2022-07-14 DIAGNOSIS — I10 Essential (primary) hypertension: Secondary | ICD-10-CM | POA: Diagnosis not present

## 2022-07-14 DIAGNOSIS — R0609 Other forms of dyspnea: Secondary | ICD-10-CM | POA: Diagnosis not present

## 2022-07-15 DIAGNOSIS — R0789 Other chest pain: Secondary | ICD-10-CM | POA: Diagnosis not present

## 2022-07-15 DIAGNOSIS — R079 Chest pain, unspecified: Secondary | ICD-10-CM | POA: Diagnosis not present

## 2022-07-15 DIAGNOSIS — I517 Cardiomegaly: Secondary | ICD-10-CM | POA: Diagnosis not present

## 2022-07-15 DIAGNOSIS — Z9049 Acquired absence of other specified parts of digestive tract: Secondary | ICD-10-CM | POA: Diagnosis not present

## 2022-07-15 DIAGNOSIS — R001 Bradycardia, unspecified: Secondary | ICD-10-CM | POA: Diagnosis not present

## 2022-07-18 DIAGNOSIS — M940 Chondrocostal junction syndrome [Tietze]: Secondary | ICD-10-CM | POA: Diagnosis not present

## 2022-07-18 DIAGNOSIS — I728 Aneurysm of other specified arteries: Secondary | ICD-10-CM | POA: Diagnosis not present

## 2022-07-18 DIAGNOSIS — G4089 Other seizures: Secondary | ICD-10-CM | POA: Diagnosis not present

## 2022-07-18 DIAGNOSIS — R634 Abnormal weight loss: Secondary | ICD-10-CM | POA: Diagnosis not present

## 2022-07-30 DIAGNOSIS — G40419 Other generalized epilepsy and epileptic syndromes, intractable, without status epilepticus: Secondary | ICD-10-CM | POA: Diagnosis not present

## 2022-08-12 DIAGNOSIS — G40319 Generalized idiopathic epilepsy and epileptic syndromes, intractable, without status epilepticus: Secondary | ICD-10-CM | POA: Diagnosis not present

## 2022-08-21 DIAGNOSIS — I10 Essential (primary) hypertension: Secondary | ICD-10-CM | POA: Diagnosis not present

## 2022-08-21 DIAGNOSIS — E785 Hyperlipidemia, unspecified: Secondary | ICD-10-CM | POA: Diagnosis not present

## 2022-08-21 DIAGNOSIS — E1169 Type 2 diabetes mellitus with other specified complication: Secondary | ICD-10-CM | POA: Diagnosis not present

## 2022-08-28 ENCOUNTER — Other Ambulatory Visit: Payer: Self-pay | Admitting: Cardiology

## 2022-09-12 DIAGNOSIS — R569 Unspecified convulsions: Secondary | ICD-10-CM | POA: Diagnosis not present

## 2022-09-22 DIAGNOSIS — R4189 Other symptoms and signs involving cognitive functions and awareness: Secondary | ICD-10-CM | POA: Diagnosis not present

## 2022-09-22 DIAGNOSIS — E785 Hyperlipidemia, unspecified: Secondary | ICD-10-CM | POA: Diagnosis not present

## 2022-09-22 DIAGNOSIS — E1169 Type 2 diabetes mellitus with other specified complication: Secondary | ICD-10-CM | POA: Diagnosis not present

## 2022-09-22 DIAGNOSIS — R351 Nocturia: Secondary | ICD-10-CM | POA: Diagnosis not present

## 2022-09-25 DIAGNOSIS — G40419 Other generalized epilepsy and epileptic syndromes, intractable, without status epilepticus: Secondary | ICD-10-CM | POA: Diagnosis not present

## 2022-10-08 ENCOUNTER — Other Ambulatory Visit: Payer: Self-pay | Admitting: Cardiology

## 2022-10-08 DIAGNOSIS — R42 Dizziness and giddiness: Secondary | ICD-10-CM

## 2022-10-08 DIAGNOSIS — I10 Essential (primary) hypertension: Secondary | ICD-10-CM

## 2022-11-18 DIAGNOSIS — G40419 Other generalized epilepsy and epileptic syndromes, intractable, without status epilepticus: Secondary | ICD-10-CM | POA: Diagnosis not present

## 2022-11-27 DIAGNOSIS — F0150 Vascular dementia without behavioral disturbance: Secondary | ICD-10-CM | POA: Diagnosis not present

## 2022-11-27 DIAGNOSIS — E1169 Type 2 diabetes mellitus with other specified complication: Secondary | ICD-10-CM | POA: Diagnosis not present

## 2022-11-27 DIAGNOSIS — F32A Depression, unspecified: Secondary | ICD-10-CM | POA: Diagnosis not present

## 2022-11-27 DIAGNOSIS — Z0001 Encounter for general adult medical examination with abnormal findings: Secondary | ICD-10-CM | POA: Diagnosis not present

## 2022-11-27 DIAGNOSIS — I10 Essential (primary) hypertension: Secondary | ICD-10-CM | POA: Diagnosis not present

## 2022-11-27 DIAGNOSIS — E559 Vitamin D deficiency, unspecified: Secondary | ICD-10-CM | POA: Diagnosis not present

## 2022-12-22 DIAGNOSIS — F411 Generalized anxiety disorder: Secondary | ICD-10-CM | POA: Diagnosis not present

## 2022-12-22 DIAGNOSIS — I69911 Memory deficit following unspecified cerebrovascular disease: Secondary | ICD-10-CM | POA: Diagnosis not present

## 2022-12-22 DIAGNOSIS — E1169 Type 2 diabetes mellitus with other specified complication: Secondary | ICD-10-CM | POA: Diagnosis not present

## 2022-12-22 DIAGNOSIS — I10 Essential (primary) hypertension: Secondary | ICD-10-CM | POA: Diagnosis not present

## 2022-12-22 DIAGNOSIS — I729 Aneurysm of unspecified site: Secondary | ICD-10-CM | POA: Diagnosis not present

## 2022-12-25 ENCOUNTER — Other Ambulatory Visit: Payer: Self-pay | Admitting: Cardiology

## 2022-12-25 DIAGNOSIS — R42 Dizziness and giddiness: Secondary | ICD-10-CM

## 2022-12-25 DIAGNOSIS — I10 Essential (primary) hypertension: Secondary | ICD-10-CM

## 2023-01-06 ENCOUNTER — Other Ambulatory Visit: Payer: Self-pay | Admitting: Cardiology

## 2023-01-06 DIAGNOSIS — I10 Essential (primary) hypertension: Secondary | ICD-10-CM

## 2023-01-06 DIAGNOSIS — R42 Dizziness and giddiness: Secondary | ICD-10-CM

## 2023-01-23 DIAGNOSIS — R4189 Other symptoms and signs involving cognitive functions and awareness: Secondary | ICD-10-CM | POA: Diagnosis not present

## 2023-01-23 DIAGNOSIS — F060 Psychotic disorder with hallucinations due to known physiological condition: Secondary | ICD-10-CM | POA: Diagnosis not present

## 2023-01-23 DIAGNOSIS — R634 Abnormal weight loss: Secondary | ICD-10-CM | POA: Diagnosis not present

## 2023-01-23 DIAGNOSIS — I729 Aneurysm of unspecified site: Secondary | ICD-10-CM | POA: Diagnosis not present

## 2023-01-23 DIAGNOSIS — E089 Diabetes mellitus due to underlying condition without complications: Secondary | ICD-10-CM | POA: Diagnosis not present

## 2023-01-23 DIAGNOSIS — G40909 Epilepsy, unspecified, not intractable, without status epilepticus: Secondary | ICD-10-CM | POA: Diagnosis not present

## 2023-01-23 DIAGNOSIS — F411 Generalized anxiety disorder: Secondary | ICD-10-CM | POA: Diagnosis not present

## 2023-02-25 DIAGNOSIS — I728 Aneurysm of other specified arteries: Secondary | ICD-10-CM | POA: Diagnosis not present

## 2023-02-25 DIAGNOSIS — R4189 Other symptoms and signs involving cognitive functions and awareness: Secondary | ICD-10-CM | POA: Diagnosis not present

## 2023-02-25 DIAGNOSIS — E785 Hyperlipidemia, unspecified: Secondary | ICD-10-CM | POA: Diagnosis not present

## 2023-02-25 DIAGNOSIS — M4722 Other spondylosis with radiculopathy, cervical region: Secondary | ICD-10-CM | POA: Diagnosis not present

## 2023-02-25 DIAGNOSIS — E1169 Type 2 diabetes mellitus with other specified complication: Secondary | ICD-10-CM | POA: Diagnosis not present

## 2023-02-25 DIAGNOSIS — I6910 Unspecified sequelae of nontraumatic intracerebral hemorrhage: Secondary | ICD-10-CM | POA: Diagnosis not present

## 2023-02-25 DIAGNOSIS — F0150 Vascular dementia without behavioral disturbance: Secondary | ICD-10-CM | POA: Diagnosis not present

## 2023-02-25 DIAGNOSIS — R413 Other amnesia: Secondary | ICD-10-CM | POA: Diagnosis not present

## 2023-03-20 DIAGNOSIS — N401 Enlarged prostate with lower urinary tract symptoms: Secondary | ICD-10-CM | POA: Diagnosis not present

## 2023-03-20 DIAGNOSIS — R3912 Poor urinary stream: Secondary | ICD-10-CM | POA: Diagnosis not present

## 2023-03-20 DIAGNOSIS — N3941 Urge incontinence: Secondary | ICD-10-CM | POA: Diagnosis not present

## 2023-03-25 ENCOUNTER — Ambulatory Visit: Payer: Medicare PPO | Admitting: Cardiology

## 2023-03-26 DIAGNOSIS — E1169 Type 2 diabetes mellitus with other specified complication: Secondary | ICD-10-CM | POA: Diagnosis not present

## 2023-03-26 DIAGNOSIS — I728 Aneurysm of other specified arteries: Secondary | ICD-10-CM | POA: Diagnosis not present

## 2023-03-26 DIAGNOSIS — E785 Hyperlipidemia, unspecified: Secondary | ICD-10-CM | POA: Diagnosis not present

## 2023-03-26 DIAGNOSIS — I10 Essential (primary) hypertension: Secondary | ICD-10-CM | POA: Diagnosis not present

## 2023-03-26 DIAGNOSIS — R413 Other amnesia: Secondary | ICD-10-CM | POA: Diagnosis not present

## 2023-03-27 DIAGNOSIS — R531 Weakness: Secondary | ICD-10-CM | POA: Diagnosis not present

## 2023-03-27 DIAGNOSIS — D72819 Decreased white blood cell count, unspecified: Secondary | ICD-10-CM | POA: Diagnosis not present

## 2023-03-27 DIAGNOSIS — R569 Unspecified convulsions: Secondary | ICD-10-CM | POA: Diagnosis not present

## 2023-03-27 DIAGNOSIS — I493 Ventricular premature depolarization: Secondary | ICD-10-CM | POA: Diagnosis not present

## 2023-03-27 DIAGNOSIS — R0902 Hypoxemia: Secondary | ICD-10-CM | POA: Diagnosis not present

## 2023-04-27 DIAGNOSIS — I10 Essential (primary) hypertension: Secondary | ICD-10-CM | POA: Diagnosis not present

## 2023-04-27 DIAGNOSIS — I729 Aneurysm of unspecified site: Secondary | ICD-10-CM | POA: Diagnosis not present

## 2023-04-27 DIAGNOSIS — E1169 Type 2 diabetes mellitus with other specified complication: Secondary | ICD-10-CM | POA: Diagnosis not present

## 2023-04-27 DIAGNOSIS — G4089 Other seizures: Secondary | ICD-10-CM | POA: Diagnosis not present

## 2023-04-27 DIAGNOSIS — F419 Anxiety disorder, unspecified: Secondary | ICD-10-CM | POA: Diagnosis not present

## 2023-04-27 DIAGNOSIS — F411 Generalized anxiety disorder: Secondary | ICD-10-CM | POA: Diagnosis not present

## 2023-04-27 DIAGNOSIS — I119 Hypertensive heart disease without heart failure: Secondary | ICD-10-CM | POA: Diagnosis not present

## 2023-04-27 DIAGNOSIS — E119 Type 2 diabetes mellitus without complications: Secondary | ICD-10-CM | POA: Diagnosis not present

## 2023-05-18 DIAGNOSIS — Z0000 Encounter for general adult medical examination without abnormal findings: Secondary | ICD-10-CM | POA: Diagnosis not present

## 2023-05-18 DIAGNOSIS — R634 Abnormal weight loss: Secondary | ICD-10-CM | POA: Diagnosis not present

## 2023-05-18 DIAGNOSIS — I729 Aneurysm of unspecified site: Secondary | ICD-10-CM | POA: Diagnosis not present

## 2023-05-18 DIAGNOSIS — F060 Psychotic disorder with hallucinations due to known physiological condition: Secondary | ICD-10-CM | POA: Diagnosis not present

## 2023-05-18 DIAGNOSIS — F0150 Vascular dementia without behavioral disturbance: Secondary | ICD-10-CM | POA: Diagnosis not present

## 2023-05-18 DIAGNOSIS — E1169 Type 2 diabetes mellitus with other specified complication: Secondary | ICD-10-CM | POA: Diagnosis not present

## 2023-05-18 DIAGNOSIS — R569 Unspecified convulsions: Secondary | ICD-10-CM | POA: Diagnosis not present

## 2023-05-18 DIAGNOSIS — I69911 Memory deficit following unspecified cerebrovascular disease: Secondary | ICD-10-CM | POA: Diagnosis not present

## 2023-05-18 DIAGNOSIS — I10 Essential (primary) hypertension: Secondary | ICD-10-CM | POA: Diagnosis not present

## 2023-06-10 DIAGNOSIS — F4325 Adjustment disorder with mixed disturbance of emotions and conduct: Secondary | ICD-10-CM | POA: Diagnosis not present

## 2023-06-12 ENCOUNTER — Ambulatory Visit: Payer: Medicare PPO | Admitting: Physician Assistant

## 2023-06-24 DIAGNOSIS — I69911 Memory deficit following unspecified cerebrovascular disease: Secondary | ICD-10-CM | POA: Diagnosis not present

## 2023-06-24 DIAGNOSIS — F411 Generalized anxiety disorder: Secondary | ICD-10-CM | POA: Diagnosis not present

## 2023-06-24 DIAGNOSIS — F333 Major depressive disorder, recurrent, severe with psychotic symptoms: Secondary | ICD-10-CM | POA: Diagnosis not present

## 2023-07-27 DIAGNOSIS — F4325 Adjustment disorder with mixed disturbance of emotions and conduct: Secondary | ICD-10-CM | POA: Diagnosis not present

## 2023-07-29 DIAGNOSIS — E782 Mixed hyperlipidemia: Secondary | ICD-10-CM | POA: Diagnosis not present

## 2023-07-29 DIAGNOSIS — E1169 Type 2 diabetes mellitus with other specified complication: Secondary | ICD-10-CM | POA: Diagnosis not present

## 2023-07-29 DIAGNOSIS — F411 Generalized anxiety disorder: Secondary | ICD-10-CM | POA: Diagnosis not present

## 2023-07-29 DIAGNOSIS — F0150 Vascular dementia without behavioral disturbance: Secondary | ICD-10-CM | POA: Diagnosis not present

## 2023-07-29 DIAGNOSIS — I10 Essential (primary) hypertension: Secondary | ICD-10-CM | POA: Diagnosis not present

## 2023-07-29 DIAGNOSIS — I69911 Memory deficit following unspecified cerebrovascular disease: Secondary | ICD-10-CM | POA: Diagnosis not present

## 2023-08-10 DIAGNOSIS — M47816 Spondylosis without myelopathy or radiculopathy, lumbar region: Secondary | ICD-10-CM | POA: Diagnosis not present

## 2023-08-10 DIAGNOSIS — F0154 Vascular dementia, unspecified severity, with anxiety: Secondary | ICD-10-CM | POA: Diagnosis not present

## 2023-08-10 DIAGNOSIS — F0152 Vascular dementia, unspecified severity, with psychotic disturbance: Secondary | ICD-10-CM | POA: Diagnosis not present

## 2023-08-10 DIAGNOSIS — F411 Generalized anxiety disorder: Secondary | ICD-10-CM | POA: Diagnosis not present

## 2023-08-10 DIAGNOSIS — M438X9 Other specified deforming dorsopathies, site unspecified: Secondary | ICD-10-CM | POA: Diagnosis not present

## 2023-08-10 DIAGNOSIS — M0569 Rheumatoid arthritis of multiple sites with involvement of other organs and systems: Secondary | ICD-10-CM | POA: Diagnosis not present

## 2023-08-10 DIAGNOSIS — M25561 Pain in right knee: Secondary | ICD-10-CM | POA: Diagnosis not present

## 2023-08-10 DIAGNOSIS — M48062 Spinal stenosis, lumbar region with neurogenic claudication: Secondary | ICD-10-CM | POA: Diagnosis not present

## 2023-08-10 DIAGNOSIS — I10 Essential (primary) hypertension: Secondary | ICD-10-CM | POA: Diagnosis not present

## 2023-08-11 DIAGNOSIS — M47816 Spondylosis without myelopathy or radiculopathy, lumbar region: Secondary | ICD-10-CM | POA: Diagnosis not present

## 2023-08-11 DIAGNOSIS — F0154 Vascular dementia, unspecified severity, with anxiety: Secondary | ICD-10-CM | POA: Diagnosis not present

## 2023-08-11 DIAGNOSIS — F411 Generalized anxiety disorder: Secondary | ICD-10-CM | POA: Diagnosis not present

## 2023-08-11 DIAGNOSIS — M438X9 Other specified deforming dorsopathies, site unspecified: Secondary | ICD-10-CM | POA: Diagnosis not present

## 2023-08-11 DIAGNOSIS — M25561 Pain in right knee: Secondary | ICD-10-CM | POA: Diagnosis not present

## 2023-08-11 DIAGNOSIS — F0152 Vascular dementia, unspecified severity, with psychotic disturbance: Secondary | ICD-10-CM | POA: Diagnosis not present

## 2023-08-11 DIAGNOSIS — I10 Essential (primary) hypertension: Secondary | ICD-10-CM | POA: Diagnosis not present

## 2023-08-11 DIAGNOSIS — M48062 Spinal stenosis, lumbar region with neurogenic claudication: Secondary | ICD-10-CM | POA: Diagnosis not present

## 2023-08-11 DIAGNOSIS — M0569 Rheumatoid arthritis of multiple sites with involvement of other organs and systems: Secondary | ICD-10-CM | POA: Diagnosis not present

## 2023-08-19 DIAGNOSIS — F411 Generalized anxiety disorder: Secondary | ICD-10-CM | POA: Diagnosis not present

## 2023-08-19 DIAGNOSIS — M48062 Spinal stenosis, lumbar region with neurogenic claudication: Secondary | ICD-10-CM | POA: Diagnosis not present

## 2023-08-19 DIAGNOSIS — M25561 Pain in right knee: Secondary | ICD-10-CM | POA: Diagnosis not present

## 2023-08-19 DIAGNOSIS — I10 Essential (primary) hypertension: Secondary | ICD-10-CM | POA: Diagnosis not present

## 2023-08-19 DIAGNOSIS — F0154 Vascular dementia, unspecified severity, with anxiety: Secondary | ICD-10-CM | POA: Diagnosis not present

## 2023-08-19 DIAGNOSIS — M47816 Spondylosis without myelopathy or radiculopathy, lumbar region: Secondary | ICD-10-CM | POA: Diagnosis not present

## 2023-08-19 DIAGNOSIS — M0569 Rheumatoid arthritis of multiple sites with involvement of other organs and systems: Secondary | ICD-10-CM | POA: Diagnosis not present

## 2023-08-19 DIAGNOSIS — F0152 Vascular dementia, unspecified severity, with psychotic disturbance: Secondary | ICD-10-CM | POA: Diagnosis not present

## 2023-08-19 DIAGNOSIS — M438X9 Other specified deforming dorsopathies, site unspecified: Secondary | ICD-10-CM | POA: Diagnosis not present

## 2023-08-24 DIAGNOSIS — M0569 Rheumatoid arthritis of multiple sites with involvement of other organs and systems: Secondary | ICD-10-CM | POA: Diagnosis not present

## 2023-08-24 DIAGNOSIS — M438X9 Other specified deforming dorsopathies, site unspecified: Secondary | ICD-10-CM | POA: Diagnosis not present

## 2023-08-24 DIAGNOSIS — M48062 Spinal stenosis, lumbar region with neurogenic claudication: Secondary | ICD-10-CM | POA: Diagnosis not present

## 2023-08-24 DIAGNOSIS — M25561 Pain in right knee: Secondary | ICD-10-CM | POA: Diagnosis not present

## 2023-08-24 DIAGNOSIS — I10 Essential (primary) hypertension: Secondary | ICD-10-CM | POA: Diagnosis not present

## 2023-08-24 DIAGNOSIS — F0154 Vascular dementia, unspecified severity, with anxiety: Secondary | ICD-10-CM | POA: Diagnosis not present

## 2023-08-24 DIAGNOSIS — F0152 Vascular dementia, unspecified severity, with psychotic disturbance: Secondary | ICD-10-CM | POA: Diagnosis not present

## 2023-08-24 DIAGNOSIS — M47816 Spondylosis without myelopathy or radiculopathy, lumbar region: Secondary | ICD-10-CM | POA: Diagnosis not present

## 2023-08-24 DIAGNOSIS — F411 Generalized anxiety disorder: Secondary | ICD-10-CM | POA: Diagnosis not present

## 2023-08-25 DIAGNOSIS — F0152 Vascular dementia, unspecified severity, with psychotic disturbance: Secondary | ICD-10-CM | POA: Diagnosis not present

## 2023-08-25 DIAGNOSIS — M48062 Spinal stenosis, lumbar region with neurogenic claudication: Secondary | ICD-10-CM | POA: Diagnosis not present

## 2023-08-25 DIAGNOSIS — F411 Generalized anxiety disorder: Secondary | ICD-10-CM | POA: Diagnosis not present

## 2023-08-25 DIAGNOSIS — M25561 Pain in right knee: Secondary | ICD-10-CM | POA: Diagnosis not present

## 2023-08-25 DIAGNOSIS — M47816 Spondylosis without myelopathy or radiculopathy, lumbar region: Secondary | ICD-10-CM | POA: Diagnosis not present

## 2023-08-25 DIAGNOSIS — I10 Essential (primary) hypertension: Secondary | ICD-10-CM | POA: Diagnosis not present

## 2023-08-25 DIAGNOSIS — F0154 Vascular dementia, unspecified severity, with anxiety: Secondary | ICD-10-CM | POA: Diagnosis not present

## 2023-08-25 DIAGNOSIS — M438X9 Other specified deforming dorsopathies, site unspecified: Secondary | ICD-10-CM | POA: Diagnosis not present

## 2023-08-25 DIAGNOSIS — M0569 Rheumatoid arthritis of multiple sites with involvement of other organs and systems: Secondary | ICD-10-CM | POA: Diagnosis not present

## 2023-08-31 DIAGNOSIS — R413 Other amnesia: Secondary | ICD-10-CM | POA: Diagnosis not present

## 2023-08-31 DIAGNOSIS — I69911 Memory deficit following unspecified cerebrovascular disease: Secondary | ICD-10-CM | POA: Diagnosis not present

## 2023-08-31 DIAGNOSIS — R443 Hallucinations, unspecified: Secondary | ICD-10-CM | POA: Diagnosis not present

## 2023-08-31 DIAGNOSIS — E1169 Type 2 diabetes mellitus with other specified complication: Secondary | ICD-10-CM | POA: Diagnosis not present

## 2023-08-31 DIAGNOSIS — E039 Hypothyroidism, unspecified: Secondary | ICD-10-CM | POA: Diagnosis not present

## 2023-08-31 DIAGNOSIS — R634 Abnormal weight loss: Secondary | ICD-10-CM | POA: Diagnosis not present

## 2023-08-31 DIAGNOSIS — G40909 Epilepsy, unspecified, not intractable, without status epilepticus: Secondary | ICD-10-CM | POA: Diagnosis not present

## 2023-09-01 DIAGNOSIS — M438X9 Other specified deforming dorsopathies, site unspecified: Secondary | ICD-10-CM | POA: Diagnosis not present

## 2023-09-01 DIAGNOSIS — M0569 Rheumatoid arthritis of multiple sites with involvement of other organs and systems: Secondary | ICD-10-CM | POA: Diagnosis not present

## 2023-09-01 DIAGNOSIS — F0154 Vascular dementia, unspecified severity, with anxiety: Secondary | ICD-10-CM | POA: Diagnosis not present

## 2023-09-01 DIAGNOSIS — F0152 Vascular dementia, unspecified severity, with psychotic disturbance: Secondary | ICD-10-CM | POA: Diagnosis not present

## 2023-09-01 DIAGNOSIS — M47816 Spondylosis without myelopathy or radiculopathy, lumbar region: Secondary | ICD-10-CM | POA: Diagnosis not present

## 2023-09-01 DIAGNOSIS — M25561 Pain in right knee: Secondary | ICD-10-CM | POA: Diagnosis not present

## 2023-09-01 DIAGNOSIS — M48062 Spinal stenosis, lumbar region with neurogenic claudication: Secondary | ICD-10-CM | POA: Diagnosis not present

## 2023-09-01 DIAGNOSIS — F411 Generalized anxiety disorder: Secondary | ICD-10-CM | POA: Diagnosis not present

## 2023-09-01 DIAGNOSIS — I10 Essential (primary) hypertension: Secondary | ICD-10-CM | POA: Diagnosis not present

## 2023-09-08 DIAGNOSIS — M25561 Pain in right knee: Secondary | ICD-10-CM | POA: Diagnosis not present

## 2023-09-08 DIAGNOSIS — M438X9 Other specified deforming dorsopathies, site unspecified: Secondary | ICD-10-CM | POA: Diagnosis not present

## 2023-09-08 DIAGNOSIS — M48062 Spinal stenosis, lumbar region with neurogenic claudication: Secondary | ICD-10-CM | POA: Diagnosis not present

## 2023-09-08 DIAGNOSIS — F0154 Vascular dementia, unspecified severity, with anxiety: Secondary | ICD-10-CM | POA: Diagnosis not present

## 2023-09-08 DIAGNOSIS — F411 Generalized anxiety disorder: Secondary | ICD-10-CM | POA: Diagnosis not present

## 2023-09-08 DIAGNOSIS — M0569 Rheumatoid arthritis of multiple sites with involvement of other organs and systems: Secondary | ICD-10-CM | POA: Diagnosis not present

## 2023-09-08 DIAGNOSIS — F0152 Vascular dementia, unspecified severity, with psychotic disturbance: Secondary | ICD-10-CM | POA: Diagnosis not present

## 2023-09-08 DIAGNOSIS — M47816 Spondylosis without myelopathy or radiculopathy, lumbar region: Secondary | ICD-10-CM | POA: Diagnosis not present

## 2023-09-08 DIAGNOSIS — I10 Essential (primary) hypertension: Secondary | ICD-10-CM | POA: Diagnosis not present

## 2023-09-09 NOTE — Progress Notes (Signed)
   09/09/2023  Patient ID: Darryl [name], male   DOB: 26-Oct-1950, 73 y.o.   MRN: [id number]  Contacted patient regarding medication adherence from a quality report for [facility]. The patient failed [facility] and MAC in 2024.     Per [facility] and payor portal fill history: Telmisartan 40 mg - last filled 08/28/23 for a 28-day supply.  Atorvastatin 40 mg - last filled 08/28/23 for a 28-day supply.    The patient is getting compliance packaging from [facility] pharmacy. Will continue to monitor for adherence as needed.   Thank you for allowing pharmacy to be a part of this patient's care.    Harlon Flor, PharmD Clinical Pharmacist  [phone]

## 2023-09-10 DIAGNOSIS — M47816 Spondylosis without myelopathy or radiculopathy, lumbar region: Secondary | ICD-10-CM | POA: Diagnosis not present

## 2023-09-10 DIAGNOSIS — F411 Generalized anxiety disorder: Secondary | ICD-10-CM | POA: Diagnosis not present

## 2023-09-10 DIAGNOSIS — M438X9 Other specified deforming dorsopathies, site unspecified: Secondary | ICD-10-CM | POA: Diagnosis not present

## 2023-09-10 DIAGNOSIS — I10 Essential (primary) hypertension: Secondary | ICD-10-CM | POA: Diagnosis not present

## 2023-09-10 DIAGNOSIS — M48062 Spinal stenosis, lumbar region with neurogenic claudication: Secondary | ICD-10-CM | POA: Diagnosis not present

## 2023-09-10 DIAGNOSIS — F0154 Vascular dementia, unspecified severity, with anxiety: Secondary | ICD-10-CM | POA: Diagnosis not present

## 2023-09-10 DIAGNOSIS — F0152 Vascular dementia, unspecified severity, with psychotic disturbance: Secondary | ICD-10-CM | POA: Diagnosis not present

## 2023-09-10 DIAGNOSIS — M25561 Pain in right knee: Secondary | ICD-10-CM | POA: Diagnosis not present

## 2023-09-10 DIAGNOSIS — M0569 Rheumatoid arthritis of multiple sites with involvement of other organs and systems: Secondary | ICD-10-CM | POA: Diagnosis not present

## 2023-09-16 DIAGNOSIS — F0152 Vascular dementia, unspecified severity, with psychotic disturbance: Secondary | ICD-10-CM | POA: Diagnosis not present

## 2023-09-16 DIAGNOSIS — M438X9 Other specified deforming dorsopathies, site unspecified: Secondary | ICD-10-CM | POA: Diagnosis not present

## 2023-09-16 DIAGNOSIS — I10 Essential (primary) hypertension: Secondary | ICD-10-CM | POA: Diagnosis not present

## 2023-09-16 DIAGNOSIS — F0154 Vascular dementia, unspecified severity, with anxiety: Secondary | ICD-10-CM | POA: Diagnosis not present

## 2023-09-16 DIAGNOSIS — M25561 Pain in right knee: Secondary | ICD-10-CM | POA: Diagnosis not present

## 2023-09-16 DIAGNOSIS — M0569 Rheumatoid arthritis of multiple sites with involvement of other organs and systems: Secondary | ICD-10-CM | POA: Diagnosis not present

## 2023-09-16 DIAGNOSIS — M48062 Spinal stenosis, lumbar region with neurogenic claudication: Secondary | ICD-10-CM | POA: Diagnosis not present

## 2023-09-16 DIAGNOSIS — M47816 Spondylosis without myelopathy or radiculopathy, lumbar region: Secondary | ICD-10-CM | POA: Diagnosis not present

## 2023-09-16 DIAGNOSIS — F411 Generalized anxiety disorder: Secondary | ICD-10-CM | POA: Diagnosis not present

## 2023-09-18 DIAGNOSIS — M25561 Pain in right knee: Secondary | ICD-10-CM | POA: Diagnosis not present

## 2023-09-18 DIAGNOSIS — M48062 Spinal stenosis, lumbar region with neurogenic claudication: Secondary | ICD-10-CM | POA: Diagnosis not present

## 2023-09-18 DIAGNOSIS — F0154 Vascular dementia, unspecified severity, with anxiety: Secondary | ICD-10-CM | POA: Diagnosis not present

## 2023-09-18 DIAGNOSIS — M47816 Spondylosis without myelopathy or radiculopathy, lumbar region: Secondary | ICD-10-CM | POA: Diagnosis not present

## 2023-09-18 DIAGNOSIS — F0152 Vascular dementia, unspecified severity, with psychotic disturbance: Secondary | ICD-10-CM | POA: Diagnosis not present

## 2023-09-18 DIAGNOSIS — I10 Essential (primary) hypertension: Secondary | ICD-10-CM | POA: Diagnosis not present

## 2023-09-18 DIAGNOSIS — F411 Generalized anxiety disorder: Secondary | ICD-10-CM | POA: Diagnosis not present

## 2023-09-18 DIAGNOSIS — M438X9 Other specified deforming dorsopathies, site unspecified: Secondary | ICD-10-CM | POA: Diagnosis not present

## 2023-09-18 DIAGNOSIS — M0569 Rheumatoid arthritis of multiple sites with involvement of other organs and systems: Secondary | ICD-10-CM | POA: Diagnosis not present

## 2023-09-22 DIAGNOSIS — M47816 Spondylosis without myelopathy or radiculopathy, lumbar region: Secondary | ICD-10-CM | POA: Diagnosis not present

## 2023-09-22 DIAGNOSIS — I10 Essential (primary) hypertension: Secondary | ICD-10-CM | POA: Diagnosis not present

## 2023-09-22 DIAGNOSIS — G40909 Epilepsy, unspecified, not intractable, without status epilepticus: Secondary | ICD-10-CM | POA: Diagnosis not present

## 2023-09-22 DIAGNOSIS — M25561 Pain in right knee: Secondary | ICD-10-CM | POA: Diagnosis not present

## 2023-09-22 DIAGNOSIS — M438X9 Other specified deforming dorsopathies, site unspecified: Secondary | ICD-10-CM | POA: Diagnosis not present

## 2023-09-22 DIAGNOSIS — M48062 Spinal stenosis, lumbar region with neurogenic claudication: Secondary | ICD-10-CM | POA: Diagnosis not present

## 2023-09-22 DIAGNOSIS — F0150 Vascular dementia without behavioral disturbance: Secondary | ICD-10-CM | POA: Diagnosis not present

## 2023-09-22 DIAGNOSIS — F0154 Vascular dementia, unspecified severity, with anxiety: Secondary | ICD-10-CM | POA: Diagnosis not present

## 2023-09-22 DIAGNOSIS — F411 Generalized anxiety disorder: Secondary | ICD-10-CM | POA: Diagnosis not present

## 2023-09-22 DIAGNOSIS — R634 Abnormal weight loss: Secondary | ICD-10-CM | POA: Diagnosis not present

## 2023-09-22 DIAGNOSIS — F0152 Vascular dementia, unspecified severity, with psychotic disturbance: Secondary | ICD-10-CM | POA: Diagnosis not present

## 2023-09-22 DIAGNOSIS — M0569 Rheumatoid arthritis of multiple sites with involvement of other organs and systems: Secondary | ICD-10-CM | POA: Diagnosis not present

## 2023-10-06 DIAGNOSIS — M25561 Pain in right knee: Secondary | ICD-10-CM | POA: Diagnosis not present

## 2023-10-06 DIAGNOSIS — F411 Generalized anxiety disorder: Secondary | ICD-10-CM | POA: Diagnosis not present

## 2023-10-06 DIAGNOSIS — M0569 Rheumatoid arthritis of multiple sites with involvement of other organs and systems: Secondary | ICD-10-CM | POA: Diagnosis not present

## 2023-10-06 DIAGNOSIS — M48062 Spinal stenosis, lumbar region with neurogenic claudication: Secondary | ICD-10-CM | POA: Diagnosis not present

## 2023-10-06 DIAGNOSIS — M47816 Spondylosis without myelopathy or radiculopathy, lumbar region: Secondary | ICD-10-CM | POA: Diagnosis not present

## 2023-10-06 DIAGNOSIS — F0154 Vascular dementia, unspecified severity, with anxiety: Secondary | ICD-10-CM | POA: Diagnosis not present

## 2023-10-06 DIAGNOSIS — I10 Essential (primary) hypertension: Secondary | ICD-10-CM | POA: Diagnosis not present

## 2023-10-06 DIAGNOSIS — M438X9 Other specified deforming dorsopathies, site unspecified: Secondary | ICD-10-CM | POA: Diagnosis not present

## 2023-10-06 DIAGNOSIS — F0152 Vascular dementia, unspecified severity, with psychotic disturbance: Secondary | ICD-10-CM | POA: Diagnosis not present

## 2023-10-13 ENCOUNTER — Other Ambulatory Visit (HOSPITAL_COMMUNITY): Payer: Self-pay | Admitting: Family Medicine

## 2023-10-13 DIAGNOSIS — I82401 Acute embolism and thrombosis of unspecified deep veins of right lower extremity: Secondary | ICD-10-CM | POA: Diagnosis not present

## 2023-10-13 DIAGNOSIS — M79604 Pain in right leg: Secondary | ICD-10-CM

## 2023-10-14 ENCOUNTER — Inpatient Hospital Stay (HOSPITAL_COMMUNITY): Admission: RE | Admit: 2023-10-14 | Source: Ambulatory Visit

## 2023-10-14 ENCOUNTER — Encounter (HOSPITAL_COMMUNITY): Payer: Self-pay

## 2023-10-14 ENCOUNTER — Ambulatory Visit (HOSPITAL_COMMUNITY): Admission: RE | Admit: 2023-10-14 | Source: Ambulatory Visit

## 2023-10-14 ENCOUNTER — Ambulatory Visit (HOSPITAL_COMMUNITY)
Admission: RE | Admit: 2023-10-14 | Discharge: 2023-10-14 | Disposition: A | Discharge status: Home / Self Care | Source: Ambulatory Visit | Attending: Family Medicine | Admitting: Family Medicine

## 2023-10-14 ENCOUNTER — Other Ambulatory Visit (HOSPITAL_COMMUNITY): Payer: Self-pay | Admitting: Family Medicine

## 2023-10-14 DIAGNOSIS — M7989 Other specified soft tissue disorders: Secondary | ICD-10-CM

## 2023-10-14 DIAGNOSIS — M79604 Pain in right leg: Secondary | ICD-10-CM | POA: Diagnosis not present

## 2023-10-23 DIAGNOSIS — J439 Emphysema, unspecified: Secondary | ICD-10-CM | POA: Diagnosis not present

## 2023-10-23 DIAGNOSIS — S6991XA Unspecified injury of right wrist, hand and finger(s), initial encounter: Secondary | ICD-10-CM | POA: Diagnosis not present

## 2023-10-23 DIAGNOSIS — M47816 Spondylosis without myelopathy or radiculopathy, lumbar region: Secondary | ICD-10-CM | POA: Diagnosis not present

## 2023-10-23 DIAGNOSIS — S0990XA Unspecified injury of head, initial encounter: Secondary | ICD-10-CM | POA: Diagnosis not present

## 2023-10-23 DIAGNOSIS — S199XXA Unspecified injury of neck, initial encounter: Secondary | ICD-10-CM | POA: Diagnosis not present

## 2023-10-23 DIAGNOSIS — Z5987 Material hardship due to limited financial resources, not elsewhere classified: Secondary | ICD-10-CM | POA: Diagnosis not present

## 2023-10-23 DIAGNOSIS — M25531 Pain in right wrist: Secondary | ICD-10-CM | POA: Diagnosis not present

## 2023-10-23 DIAGNOSIS — W19XXXA Unspecified fall, initial encounter: Secondary | ICD-10-CM | POA: Diagnosis not present

## 2023-10-23 DIAGNOSIS — I700 Atherosclerosis of aorta: Secondary | ICD-10-CM | POA: Diagnosis not present

## 2023-10-23 DIAGNOSIS — M25561 Pain in right knee: Secondary | ICD-10-CM | POA: Diagnosis not present

## 2023-10-23 DIAGNOSIS — Z608 Other problems related to social environment: Secondary | ICD-10-CM | POA: Diagnosis not present

## 2023-10-23 DIAGNOSIS — M25551 Pain in right hip: Secondary | ICD-10-CM | POA: Diagnosis not present

## 2023-10-23 DIAGNOSIS — S80211A Abrasion, right knee, initial encounter: Secondary | ICD-10-CM | POA: Diagnosis not present

## 2023-10-23 DIAGNOSIS — S80919A Unspecified superficial injury of unspecified knee, initial encounter: Secondary | ICD-10-CM | POA: Diagnosis not present

## 2023-10-23 DIAGNOSIS — Z758 Other problems related to medical facilities and other health care: Secondary | ICD-10-CM | POA: Diagnosis not present

## 2023-10-23 NOTE — ED Provider Notes (Signed)
 [facility] Emergency Department Physician Note  ED Course - HPI and Medical Decision Making  Chief Complaint: Fall   History is obtained from Patient HPI: This is a 73 y.o. male who presents to the emergency department via EMS with mechanical fall. Patient states he lost his balance and fell today. He did hit his head and also complains of right knee and right hip pain. Patient is not anticoagulated. Patient denies loss of consciousness. He denies any other pain.    MDM and Assessment: -Differential diagnosis includes, but not limited to: DDX: strain, sprain, fracture, contusion, dislocation, hematoma, solid organ injury, SAH, epidural hematoma, laceration, hollow viscous organ injury, abrasion  -Initial plan: Place patient on telemetry with continuous pulse oximeter reading and vital signs per emergency protocol, CT Head to evaluate for acute intracranial abnormality including SAH, IPH, SDH, masses, hydrocephalus, CT Cervical spine to evaluate for fracture and/or traumatic malalignment, and XR right knee, XR right hip  -Cardiac telemetry ordered for continuous cardiac and hemodynamic monitoring; reviewed telemetry showing Sinus bradycardia without arrhythmia and Premature ventricular contractions  Patient presents to the emergency department for evaluation of potential injuries sustained in a mechanical fall. There was no history to concern me for syncope or other etiology of fall. No palpitations, no chest pain, no lightheadedness/dizziness, no dyspnea. Patient in no acute distress on arrival, hemodynamically stable, well appearing. Primary exam is normal, airway intact, bilateral breath sounds, equal pulses and normal neurovascular status in the extremities Secondary exam reveals abrasion right knee, tender right knee and hip. Provided tylenol  for analgesia Imaging included CT head/c-spine, plain film pelvis/right hip and right knee which on my interpretation  shows NAICA, no c-spine fx/traumatic malalignment, no fracture/dislocation to hip or knee. Will plan for conservative management, feel patient is stable for discharge home    Medical Decision Making Problems Addressed: Accident due to mechanical fall without injury, initial encounter: complicated acute illness or injury  Amount and/or Complexity of Data Reviewed External Data Reviewed: notes.    Details: Reviewed cardiology consult on 05/05/2022 for bradycardia. Radiology: ordered and independent interpretation performed.  Risk OTC drugs.    Factors Impacting ED Encounter Complexity: Decision regarding hospitalization or escalation of hospital level care, Discussion regarding prescription drug management , and Diagnosis or treatment significantly limited by social determinants of health including availability of resources to meet daily needs, problems related to social/economic environment, and access to health care services Impression  Patient's presentation is most consistent with acute presentation with potential threat to life or bodily function. 1. Accident due to mechanical fall without injury, initial encounter    ED Disposition     ED Disposition  Discharge   Condition  Stable   Comment  --        Follow-up Kennieth JINNY Leech, MD [address] [place] [address] [phone]     Review of Systems  A pertinent review of systems was obtained and was negative except as noted in the HPI and MDM. Physical Exam   ED Triage Vitals [10/23/23 2031]  Temp 98.1 F (36.7 C)  Heart Rate 68  Resp 18  BP (!) 134/55  MAP (mmHg)   SpO2 95 %  O2 Device None (Room air)  O2 Flow Rate (L/min)   Weight 61.2 kg (135 lb)   Physical Exam  Physical Exam Neurologic: GCS 15, motor intact in all four extremities, sensory intact in all 4 extremities Head: Pupils are 3mm, equally round and reactive  to light, patient has no obvious facial trauma, no hemotympanum, no scalp  trauma Neck: C-spine non-tender, no stepoff Thorax: Clavicles intact without deformity. Chest stable to AP and lateral compression with bilateral chest rise. No crepitus. No penetrating thoracic injury. CV/Pulm: RRR, no audible murmer/rubs/gallops, CTAB Abdomen: Patient has no abdominal distention, no penetrating abdominal injury. Back: Patient has no midline spinal tenderness in the thoracic and lumbar spine, patient has no paraspinal tenderness bilaterally. Pelvis: Patient has a stable pelvis to compression with palpable femoral pulses. Tenderness to the right hip.  Extremities:Patient's upper extremities with no obvious injury or abnormality, radial pulses present. Patient's lower extremities with no obvious abnormality, tibial pulses present. Abrasion and tenderness to the right knee.   Other pertinent exam findings per MDM-ED Course Procedures  Procedures ____________________________ Scribe's Attestation: This document serves as a record of services personally performed by Merilee Maffucci, MD. It was created on their behalf by Sherryle Hamilton, Scribe, a trained medical scribe. The creation of this record is the provider's dictation and/or activities during the visit.   Electronically signed by: Sherryle Hamilton, Scribe 10/23/2023 8:45 PM

## 2023-10-23 NOTE — ED Notes (Signed)
 Pt returned from CT.   Darryl [name], [place] 10/23/23 [id number]

## 2023-11-05 DIAGNOSIS — R069 Unspecified abnormalities of breathing: Secondary | ICD-10-CM | POA: Diagnosis not present

## 2023-11-05 DIAGNOSIS — I10 Essential (primary) hypertension: Secondary | ICD-10-CM | POA: Diagnosis not present

## 2023-11-18 DIAGNOSIS — F4325 Adjustment disorder with mixed disturbance of emotions and conduct: Secondary | ICD-10-CM | POA: Diagnosis not present

## 2023-11-19 DIAGNOSIS — R443 Hallucinations, unspecified: Secondary | ICD-10-CM | POA: Diagnosis not present

## 2023-11-19 DIAGNOSIS — W010XXA Fall on same level from slipping, tripping and stumbling without subsequent striking against object, initial encounter: Secondary | ICD-10-CM | POA: Diagnosis not present

## 2023-11-19 DIAGNOSIS — R410 Disorientation, unspecified: Secondary | ICD-10-CM | POA: Diagnosis not present

## 2023-11-19 DIAGNOSIS — I9589 Other hypotension: Secondary | ICD-10-CM | POA: Diagnosis not present

## 2023-11-30 DIAGNOSIS — G40909 Epilepsy, unspecified, not intractable, without status epilepticus: Secondary | ICD-10-CM | POA: Diagnosis not present

## 2023-11-30 DIAGNOSIS — I959 Hypotension, unspecified: Secondary | ICD-10-CM | POA: Diagnosis not present

## 2023-11-30 DIAGNOSIS — F0150 Vascular dementia without behavioral disturbance: Secondary | ICD-10-CM | POA: Diagnosis not present

## 2023-12-01 ENCOUNTER — Ambulatory Visit: Admitting: Rehabilitation

## 2023-12-08 DIAGNOSIS — S0083XA Contusion of other part of head, initial encounter: Secondary | ICD-10-CM | POA: Diagnosis not present

## 2023-12-08 DIAGNOSIS — M25551 Pain in right hip: Secondary | ICD-10-CM | POA: Diagnosis not present

## 2023-12-08 DIAGNOSIS — W19XXXA Unspecified fall, initial encounter: Secondary | ICD-10-CM | POA: Diagnosis not present

## 2023-12-08 DIAGNOSIS — S0990XA Unspecified injury of head, initial encounter: Secondary | ICD-10-CM | POA: Diagnosis not present

## 2023-12-08 DIAGNOSIS — M5030 Other cervical disc degeneration, unspecified cervical region: Secondary | ICD-10-CM | POA: Diagnosis not present

## 2023-12-08 DIAGNOSIS — M25552 Pain in left hip: Secondary | ICD-10-CM | POA: Diagnosis not present

## 2023-12-08 DIAGNOSIS — M160 Bilateral primary osteoarthritis of hip: Secondary | ICD-10-CM | POA: Diagnosis not present

## 2023-12-08 DIAGNOSIS — Z043 Encounter for examination and observation following other accident: Secondary | ICD-10-CM | POA: Diagnosis not present

## 2023-12-08 DIAGNOSIS — S51011A Laceration without foreign body of right elbow, initial encounter: Secondary | ICD-10-CM | POA: Diagnosis not present

## 2023-12-08 DIAGNOSIS — M4802 Spinal stenosis, cervical region: Secondary | ICD-10-CM | POA: Diagnosis not present

## 2023-12-08 DIAGNOSIS — M419 Scoliosis, unspecified: Secondary | ICD-10-CM | POA: Diagnosis not present

## 2023-12-08 DIAGNOSIS — G9389 Other specified disorders of brain: Secondary | ICD-10-CM | POA: Diagnosis not present

## 2023-12-08 DIAGNOSIS — R9082 White matter disease, unspecified: Secondary | ICD-10-CM | POA: Diagnosis not present

## 2023-12-08 DIAGNOSIS — R001 Bradycardia, unspecified: Secondary | ICD-10-CM | POA: Diagnosis not present

## 2023-12-15 ENCOUNTER — Ambulatory Visit: Attending: Family Medicine | Admitting: Rehabilitation

## 2023-12-15 NOTE — Therapy (Deleted)
 OUTPATIENT PHYSICAL THERAPY NEURO EVALUATION   Patient Name: Darryl [name] MRN: [id number] DOB:[birth year], 73 y.o., male Today's Date: 12/15/2023   END OF SESSION:   Past Medical History:  Diagnosis Date   Acute deep vein thrombosis (DVT) of left femoral vein (HCC) 07/01/2015   Anxiety    Diabetes mellitus without complication (HCC)    Epilepsy (HCC)    Erectile dysfunction    Hyperlipidemia    Hypertension    Seizures (HCC)    Past Surgical History:  Procedure Laterality Date   CHOLECYSTECTOMY     IR GENERIC HISTORICAL  05/16/2016   IR ANGIO VERTEBRAL SEL VERTEBRAL UNI L MOD SED 05/16/2016 [facility]   IR GENERIC HISTORICAL  05/16/2016   IR ANGIOGRAM EXTREMITY RIGHT 05/16/2016 [facility]   IR GENERIC HISTORICAL  05/16/2016   IR ANGIO INTRA EXTRACRAN SEL COM CAROTID INNOMINATE UNI R MOD SED 05/16/2016 [facility]   KNEE SURGERY     LEFT HEART CATH AND CORONARY ANGIOGRAPHY N/A 08/04/2019   Procedure: LEFT HEART CATH AND CORONARY ANGIOGRAPHY;  Surgeon: Elmira Newman PARAS, MD;  Location: [facility];  Service: Cardiovascular;  Laterality: N/A;   RADIOLOGY WITH ANESTHESIA N/A 06/11/2015   Procedure: RADIOLOGY WITH ANESTHESIA;  Surgeon: Gerldine Maizes, MD;  Location: [facility];  Service: Radiology;  Laterality: N/A;   Patient Active Problem List   Diagnosis Date Noted   Other spondylosis with radiculopathy, cervical region 08/15/2019   Unstable angina (HCC) 08/04/2019   Chest pain 08/04/2019   Bradycardia 06/18/2019   Dizziness 06/18/2019   Trigger thumb, right thumb 06/08/2019   Impingement syndrome of left shoulder 06/08/2019   Chronic left shoulder pain 07/20/2018   Subungual hematoma of foot, left, initial encounter 07/20/2018   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 07/25/2015   Falls    Acute deep vein thrombosis (DVT) of left femoral vein (HCC) 07/01/2015   Pain    Poor fluid intake    Essential hypertension     Type 2 diabetes mellitus with complication, without long-term current use of insulin  (HCC)    Disorientation    Convulsions (HCC)    Acute blood loss anemia    Hypokalemia    Ileus (HCC)    Leukocytosis    Right knee pain    Cognitive safety issue    Impulsiveness    PVC (premature ventricular contraction)    Abdominal discomfort    SAH (subarachnoid hemorrhage) (HCC) 06/11/2015   Emesis    Subarachnoid hemorrhage (HCC) 06/10/2015   Diabetes mellitus with coincident hypertension (HCC)    Diabetes mellitus without complication (HCC)     PCP: Benjamine Aland, MD   REFERRING PROVIDER: Benjamine Aland, MD ***   REFERRING DIAG: ***  THERAPY DIAG:  No diagnosis found.  RATIONALE FOR EVALUATION AND TREATMENT: Rehabilitation  ONSET DATE: ***  NEXT MD VISIT: ***   SUBJECTIVE:  SUBJECTIVE STATEMENT: ***  Pt accompanied by: {accompnied:27141}  PAIN: Are you having pain? {OPRCPAIN:27236}  PERTINENT HISTORY:  ***  IMAGING:    CT Head WO Contrast [[id number]] Collected: 12/08/23 0807   Order Status: Completed Updated: 12/08/23 0815   Narrative:     CLINICAL DATA:  fall into wall (hole in wall), contusion around right eyebrow   EXAM: CT HEAD WITHOUT CONTRAST   TECHNIQUE: Contiguous axial images were obtained from the base of the skull through the vertex without intravenous contrast.   RADIATION DOSE REDUCTION: This exam was performed according to the departmental dose-optimization program which includes automated exposure control, adjustment of the mA and/or kV according to patient size and/or use of iterative reconstruction technique.   COMPARISON:  CT of the head dated Oct 23, 2023.   FINDINGS: Brain: Age-related cerebral volume loss. Moderate  periventricular and deep cerebral white matter disease. Focal encephalomalacia changes within the right frontal lobe associated with a dystrophic calcification in the subcortical white matter. No evidence of hemorrhage, mass or acute cortical infarct. No evidence of acute intracranial injury.   Vascular: There are calcifications within the carotid siphons. Patient is status post coiling of the V4 segment of the right vertebral artery.   Skull: Intact and unremarkable. There is soft tissue swelling of the right forehead.   Sinuses/Orbits: Mucosal disease within the maxillary sinuses and ethmoid air cells. Normal orbits.   Other: None.     Impression:     1. Right forehead soft tissue swelling. No evidence of acute intracranial injury. 2. Chronic encephalomalacia changes in the right frontal lobe and moderate diffuse cerebral white matter disease.     Electronically Signed   By: Evalene Coho M.D.   On: 12/08/2023 08:12     CT Spine Cervical WO Contrast [[id number]] Collected: 12/08/23 9187   Order Status: Completed Updated: 12/08/23 0819   Narrative:     CLINICAL DATA:  fall into wall   EXAM: CT CERVICAL SPINE WITHOUT CONTRAST   TECHNIQUE: Multidetector CT imaging of the cervical spine was performed without intravenous contrast. Multiplanar CT image reconstructions were also generated.   RADIATION DOSE REDUCTION: This exam was performed according to the departmental dose-optimization program which includes automated exposure control, adjustment of the mA and/or kV according to patient size and/or use of iterative reconstruction technique.   COMPARISON:  CT of the cervical spine dated Oct 23, 2023.   FINDINGS: Alignment: Normal.   Skull base and vertebrae: No acute fracture. No primary bone lesion or focal pathologic process.   Soft tissues and spinal canal: Status post coiling of the V4 segment of the right vertebral artery. Unremarkable  otherwise.   Disc levels: Mild disc space narrowing and endplate ridging at C3-4, C4-5 and C5-6, with mild central spinal canal stenosis at each level. The other disc space levels are unremarkable. There is no significant neural foraminal stenosis.   Upper chest: The visualized lung apices are clear.   Other: None.     Impression:     1. Multilevel mild degenerative disc disease. No evidence of acute traumatic injury.     Electronically Signed   By: Evalene Coho M.D.   On: 12/08/2023 08:16     XR Pelvis 1-2 Views [[id number]] Collected: 12/08/23 0746   Order Status: Completed Updated: 12/08/23 0751   Narrative:     CLINICAL DATA:  Unwitnessed fall.   EXAM: PELVIS - 1-2 VIEW   COMPARISON:  Abdomen film 06/19/2015, AP pelvis 10/23/2023   FINDINGS:  AP pelvis one view obtained in 2 attempts. There is no evidence of pelvic fracture or diastasis. No pelvic bone lesions are seen.   There are degenerative changes of the hip joints and spurring at the SI joints and symphysis, degenerative change and dextroscoliosis lumbar spine.   Large amount of stool is again noted in the rectum, moderate fecal stasis in the elsewhere-visualized large bowel. Soft tissues are otherwise unremarkable.     Impression:     1. No evidence of pelvic fracture or diastasis. 2. Degenerative changes. 3. Large amount of stool in the rectum, moderate fecal stasis in the elsewhere-visualized large bowel.     Electronically Signed   By: Francis Quam M.D.   On: 12/08/2023 07:49 PRECAUTIONS: {Therapy precautions:24002}  RED FLAGS: {PT Red Flags:29287}  WEIGHT BEARING RESTRICTIONS: {Yes ***/No:24003}  FALLS:  Has patient fallen in last 6 months? {fallsyesno:27318}  LIVING ENVIRONMENT: Lives with: {OPRC lives with:25569::lives with their family} Lives in: {Lives in:25570} Stairs: {opstairs:27293} Has following equipment at home: {Assistive devices:23999}  OCCUPATION:  ***  PLOF: {PLOF:24004}  PATIENT GOALS: ***   OBJECTIVE: (objective measures completed at initial evaluation unless otherwise dated)  DIAGNOSTIC FINDINGS:  ***  COGNITION: Overall cognitive status: {cognition:24006}   SENSATION: {sensation:27233}  COORDINATION: ***  EDEMA:  {edema:24020}  MUSCLE TONE: {LE tone:25568}  DTRs:  {DTR SITE:24025}  POSTURE:  {posture:25561}  MUSCLE LENGTH: Hamstrings: Right *** deg; Left *** deg Thomas test: Right *** deg; Left *** deg Hamstrings: *** ITB: *** Piriformis: *** Hip flexors: *** Quads: *** Heelcord: ***  LOWER EXTREMITY ROM:     {AROM/PROM:27142}  Right eval Left eval  Hip flexion    Hip extension    Hip abduction    Hip adduction    Hip internal rotation    Hip external rotation    Knee flexion    Knee extension    Ankle dorsiflexion    Ankle plantarflexion    Ankle inversion    Ankle eversion     (Blank rows = not tested)  LOWER EXTREMITY MMT:    MMT Right eval Left eval  Hip flexion    Hip extension    Hip abduction    Hip adduction    Hip internal rotation    Hip external rotation    Knee flexion    Knee extension    Ankle dorsiflexion    Ankle plantarflexion    Ankle inversion    Ankle eversion    (Blank rows = not tested)  BED MOBILITY:  {Bed mobility:24027}  TRANSFERS: Assistive device utilized: {Assistive devices:23999}  Sit to stand: {Levels of assistance:24026} Stand to sit: {Levels of assistance:24026} Chair to chair: {Levels of assistance:24026} Floor: {Levels of assistance:24026}  GAIT: Distance walked: *** Assistive device utilized: {Assistive devices:23999} Level of assistance: {Levels of assistance:24026} Gait pattern: {gait characteristics:25376} Comments: ***  RAMP: Level of Assistance: {Levels of assistance:24026} Assistive device utilized: {Assistive devices:23999} Ramp Comments: ***  CURB:  Level of Assistance: {Levels of assistance:24026} Assistive  device utilized: {Assistive devices:23999} Curb Comments: ***  STAIRS:  Level of Assistance: {Levels of assistance:24026}  Stair Negotiation Technique: {Stair Technique:27161} with {Rail Assistance:27162}  Number of Stairs: ***   Height of Stairs: ***  Comments: ***  FUNCTIONAL TESTS:  {Functional tests:24029}  PATIENT SURVEYS:  {rehab surveys:24030}   TODAY'S TREATMENT:   ***   PATIENT EDUCATION:  Education details: {Education details:27468}  Person educated: {Person educated:25204} Education method: {Education Method EU:67125} Education comprehension: {Education Comprehension:25206}  HOME EXERCISE PROGRAM: ***  ASSESSMENT:  CLINICAL IMPRESSION: Darryl [name] is a 73 y.o. male who was referred to physical therapy for evaluation and treatment for ***.  Patient presents with physical impairments of impaired activity tolerance, impaired standing balance, impaired ambulation, and decreased safety awareness impacting safe and independent functional mobility.  Examination revealed patient is at risk for falls and functional decline as evidenced by the following objective test measures: Gait speed *** ft/sec, (2.62 ft/sec is needed for community access), mCTSIB: position 1: *** sec, position 2: *** sec, position 3: *** sec, position 4: *** sec (30sec in each position demonstrates equal weighting of balance systems), TUG of *** sec (>13.5 sec indicates increased risk for falls), and 5xSTS of *** sec (>15 sec indicates increased risk for falls and decreased BLE power).  ABC scale score of ***% indicates a *** level of physical functioning.  Darryl [name] will benefit from skilled PT to address above deficits to improve mobility and activity tolerance to help reach the maximal level of functional independence and mobility. Patient demonstrates understanding of this POC and is in agreement with this plan.   OBJECTIVE IMPAIRMENTS: {opptimpairments:25111}.   ACTIVITY LIMITATIONS:  {activitylimitations:27494}  PARTICIPATION LIMITATIONS: {participationrestrictions:25113}  PERSONAL FACTORS: {Personal factors:25162} are also affecting patient's functional outcome.   REHAB POTENTIAL: {rehabpotential:25112}  CLINICAL DECISION MAKING: {clinical decision making:25114}  EVALUATION COMPLEXITY: {Evaluation complexity:25115}   GOALS: Goals reviewed with patient? {yes/no:20286}  SHORT TERM GOALS: Target date: ***  Patient will be independent with initial HEP to improve outcomes and carryover.  Baseline: *** Goal status: {GOALSTATUS:25110}  2.  Patient will be educated on strategies to decrease risk of falls.  Baseline: *** Goal status: {GOALSTATUS:25110}  3.  Patient will demonstrate decreased TUG time to </= *** sec to decrease risk for falls with transitional mobility. Baseline: *** Goal status: {GOALSTATUS:25110}  LONG TERM GOALS: Target date: ***  Patient will be independent with advanced/ongoing HEP to facilitate ability to maintain/progress functional gains from skilled physical therapy services. Baseline: *** Goal status: {GOALSTATUS:25110}  2.  Patient will be able to ambulate 600' with or w/o LRAD on variable surfaces with good safety to access community.  Baseline: *** Goal status: {GOALSTATUS:25110}  3.  Patient will be able to step up/down curb safely with LRAD for safety with community ambulation.  Baseline: *** Goal status: {GOALSTATUS:25110}   4.  Patient will demonstrate improved *** strength to >/= ***/5 for improved stability and ease of mobility . Baseline: Refer to above LE MMT table Goal status: {GOALSTATUS:25110}  5.  Patient will improve 5xSTS time to </= *** seconds for improved efficiency and safety with transfers. Baseline: *** Goal status: {GOALSTATUS:25110}   6.  Patient will demonstrate gait speed of >/= 1.8 ft/sec (0.55 m/s) to be a safe limited community ambulator with decreased risk for recurrent falls.  Baseline:  *** Goal status: {GOALSTATUS:25110}  7.  Patient will improve Berg score to >/= ***/56 to improve safety and stability with ADLs in standing and reduce risk for falls. (MCID= 8 points)  Baseline: *** Goal status: {GOALSTATUS:25110}  8.  Patient will demonstrate at least ***19/24 on DGI to improve gait stability and reduce risk for falls. Baseline: *** Goal status: {GOALSTATUS:25110}  9. Patient will improve FGA score to at least ***19/30 to improve gait stability and reduce risk for falls. Baseline: *** Goal status: {GOALSTATUS:25110}  10.  Patient will report >/= ***% on ABC scale (MCID = 19%) to demonstrate improved balance confidence with functional mobility and gait. Baseline: *** Goal status: {GOALSTATUS:25110}  PLAN:  PT FREQUENCY: {rehab frequency:25116}  PT DURATION: {rehab duration:25117}  PLANNED INTERVENTIONS: {rehab planned interventions:25118::97110-Therapeutic exercises,97530- Therapeutic [phone]- Neuromuscular re-education,97535- Self Rjmz,02859- Manual therapy}  PLAN FOR NEXT SESSION: PIERRETTE RED SENIOR, PT 12/15/2023, 3:29 PM

## 2024-01-01 NOTE — Therapy (Incomplete)
 OUTPATIENT PHYSICAL THERAPY NEURO EVALUATION   Patient Name: Darryl [name] MRN: [id number] DOB:[birth year], 73 y.o., male Today's Date: 01/01/2024   PCP: *** REFERRING PROVIDER: ***  END OF SESSION:   Past Medical History:  Diagnosis Date   Acute deep vein thrombosis (DVT) of left femoral vein (HCC) 07/01/2015   Anxiety    Diabetes mellitus without complication (HCC)    Epilepsy (HCC)    Erectile dysfunction    Hyperlipidemia    Hypertension    Seizures (HCC)    Past Surgical History:  Procedure Laterality Date   CHOLECYSTECTOMY     IR GENERIC HISTORICAL  05/16/2016   IR ANGIO VERTEBRAL SEL VERTEBRAL UNI L MOD SED 05/16/2016 [facility]   IR GENERIC HISTORICAL  05/16/2016   IR ANGIOGRAM EXTREMITY RIGHT 05/16/2016 [facility]   IR GENERIC HISTORICAL  05/16/2016   IR ANGIO INTRA EXTRACRAN SEL COM CAROTID INNOMINATE UNI R MOD SED 05/16/2016 [facility]   KNEE SURGERY     LEFT HEART CATH AND CORONARY ANGIOGRAPHY N/A 08/04/2019   Procedure: LEFT HEART CATH AND CORONARY ANGIOGRAPHY;  Surgeon: Elmira Newman PARAS, MD;  Location: [facility];  Service: Cardiovascular;  Laterality: N/A;   RADIOLOGY WITH ANESTHESIA N/A 06/11/2015   Procedure: RADIOLOGY WITH ANESTHESIA;  Surgeon: Gerldine Maizes, MD;  Location: [facility];  Service: Radiology;  Laterality: N/A;   Patient Active Problem List   Diagnosis Date Noted   Other spondylosis with radiculopathy, cervical region 08/15/2019   Unstable angina (HCC) 08/04/2019   Chest pain 08/04/2019   Bradycardia 06/18/2019   Dizziness 06/18/2019   Trigger thumb, right thumb 06/08/2019   Impingement syndrome of left shoulder 06/08/2019   Chronic left shoulder pain 07/20/2018   Subungual hematoma of foot, left, initial encounter 07/20/2018   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 07/25/2015   Falls    Acute deep vein thrombosis (DVT) of left femoral vein (HCC) 07/01/2015   Pain    Poor  fluid intake    Essential hypertension    Type 2 diabetes mellitus with complication, without long-term current use of insulin  (HCC)    Disorientation    Convulsions (HCC)    Acute blood loss anemia    Hypokalemia    Ileus (HCC)    Leukocytosis    Right knee pain    Cognitive safety issue    Impulsiveness    PVC (premature ventricular contraction)    Abdominal discomfort    SAH (subarachnoid hemorrhage) (HCC) 06/11/2015   Emesis    Subarachnoid hemorrhage (HCC) 06/10/2015   Diabetes mellitus with coincident hypertension (HCC)    Diabetes mellitus without complication (HCC)     ONSET DATE: about a year ago?? ***  REFERRING DIAG:  R26.9 (ICD-10-CM) - Unspecified abnormalities of gait and mobility   THERAPY DIAG:  No diagnosis found.  Rationale for Evaluation and Treatment: Rehabilitation  SUBJECTIVE:  SUBJECTIVE STATEMENT: *** Pt accompanied by: {accompnied:27141}  PERTINENT HISTORY: *** H/o T2DM, DVT, HTN, Epilepsy, Vascular Dementia, Hyperlipidemia  PAIN:  Are you having pain? {OPRCPAIN:27236}  PRECAUTIONS: {Therapy precautions:24002}  RED FLAGS: {PT Red Flags:29287}   WEIGHT BEARING RESTRICTIONS: {Yes ***/No:24003}  FALLS: Has patient fallen in last 6 months? {fallsyesno:27318}  LIVING ENVIRONMENT: Lives with: {OPRC lives with:25569::lives with their family} Lives in: {Lives in:25570} Stairs: {opstairs:27293} Has following equipment at home: {Assistive devices:23999}  PLOF: {PLOF:24004}  PATIENT GOALS: ***  OBJECTIVE:  Note: Objective measures were completed at Evaluation unless otherwise noted.  DIAGNOSTIC FINDINGS: *** Pt had various imaging studies done in 2021 of head, chest, neck    COGNITION: Overall cognitive status:  {cognition:24006}   SENSATION: {sensation:27233}  COORDINATION: ***  EDEMA:  {edema:24020}  MUSCLE TONE: {LE tone:25568}  MUSCLE LENGTH: Hamstrings: Right *** deg; Left *** deg Debby test: Right *** deg; Left *** deg  DTRs:  {DTR SITE:24025}  POSTURE: {posture:25561}  LOWER EXTREMITY ROM:     {AROM/PROM:27142}  Right Eval Left Eval  Hip flexion    Hip extension    Hip abduction    Hip adduction    Hip internal rotation    Hip external rotation    Knee flexion    Knee extension    Ankle dorsiflexion    Ankle plantarflexion    Ankle inversion    Ankle eversion     (Blank rows = not tested)  LOWER EXTREMITY MMT:    MMT Right Eval Left Eval  Hip flexion    Hip extension    Hip abduction    Hip adduction    Hip internal rotation    Hip external rotation    Knee flexion    Knee extension    Ankle dorsiflexion    Ankle plantarflexion    Ankle inversion    Ankle eversion    (Blank rows = not tested)  BED MOBILITY:  {bed mobility:32615:p}  TRANSFERS: {transfers eval:32620}  RAMP:  {ramp eval:32616}  CURB:  {curb eval:32617}  STAIRS: {stairs eval:32618} GAIT: Findings: {GaitneuroPT:32644::Distance walked: ***,Comments: ***}  FUNCTIONAL TESTS:  {Functional tests:24029}  PATIENT SURVEYS:  {rehab surveys:24030}                                                                                                                              TREATMENT DATE: ***    PATIENT EDUCATION: Education details: *** Person educated: {Person educated:25204} Education method: {Education Method:25205} Education comprehension: {Education Comprehension:25206}  HOME EXERCISE PROGRAM: ***  GOALS: Goals reviewed with patient? {yes/no:20286}  SHORT TERM GOALS: Target date: ***  *** Baseline: Goal status: INITIAL  2.  *** Baseline:  Goal status: INITIAL  3.  *** Baseline:  Goal status: INITIAL  4.  *** Baseline:  Goal status: INITIAL  5.   *** Baseline:  Goal status: INITIAL  6.  *** Baseline:  Goal status: INITIAL  LONG TERM GOALS: Target date: ***  *** Baseline:  Goal status: INITIAL  2.  *** Baseline:  Goal status: INITIAL  3.  *** Baseline:  Goal status: INITIAL  4.  *** Baseline:  Goal status: INITIAL  5.  *** Baseline:  Goal status: INITIAL  6.  *** Baseline:  Goal status: INITIAL  ASSESSMENT:  CLINICAL IMPRESSION: Patient is a *** y.o. *** who was seen today for physical therapy evaluation and treatment for ***.   OBJECTIVE IMPAIRMENTS: {opptimpairments:25111}.   ACTIVITY LIMITATIONS: {activitylimitations:27494}  PARTICIPATION LIMITATIONS: {participationrestrictions:25113}  PERSONAL FACTORS: {Personal factors:25162} are also affecting patient's functional outcome.   REHAB POTENTIAL: {rehabpotential:25112}  CLINICAL DECISION MAKING: {clinical decision making:25114}  EVALUATION COMPLEXITY: {Evaluation complexity:25115}  PLAN:  PT FREQUENCY: {rehab frequency:25116}  PT DURATION: {rehab duration:25117}  PLANNED INTERVENTIONS: {rehab planned interventions:25118::97110-Therapeutic exercises,97530- Therapeutic [phone]- Neuromuscular re-education,97535- Self Rjmz,02859- Manual therapy}  PLAN FOR NEXT SESSION: ***   Mariha Sleeper, Student-PT 01/01/2024, 11:10 AM

## 2024-01-05 ENCOUNTER — Ambulatory Visit: Admitting: Physical Therapy

## 2024-01-26 DIAGNOSIS — I10 Essential (primary) hypertension: Secondary | ICD-10-CM | POA: Diagnosis not present

## 2024-01-26 DIAGNOSIS — D509 Iron deficiency anemia, unspecified: Secondary | ICD-10-CM | POA: Diagnosis not present

## 2024-01-26 DIAGNOSIS — I509 Heart failure, unspecified: Secondary | ICD-10-CM | POA: Diagnosis not present

## 2024-01-26 DIAGNOSIS — R635 Abnormal weight gain: Secondary | ICD-10-CM | POA: Diagnosis not present

## 2024-01-26 DIAGNOSIS — I499 Cardiac arrhythmia, unspecified: Secondary | ICD-10-CM | POA: Diagnosis not present

## 2024-01-26 DIAGNOSIS — R609 Edema, unspecified: Secondary | ICD-10-CM | POA: Diagnosis not present

## 2024-01-26 DIAGNOSIS — F0150 Vascular dementia without behavioral disturbance: Secondary | ICD-10-CM | POA: Diagnosis not present

## 2024-01-26 DIAGNOSIS — R410 Disorientation, unspecified: Secondary | ICD-10-CM | POA: Diagnosis not present

## 2024-01-26 DIAGNOSIS — E1169 Type 2 diabetes mellitus with other specified complication: Secondary | ICD-10-CM | POA: Diagnosis not present

## 2024-02-03 DIAGNOSIS — G40419 Other generalized epilepsy and epileptic syndromes, intractable, without status epilepticus: Secondary | ICD-10-CM | POA: Diagnosis not present

## 2024-03-02 DIAGNOSIS — F0150 Vascular dementia without behavioral disturbance: Secondary | ICD-10-CM | POA: Diagnosis not present

## 2024-03-02 DIAGNOSIS — G40909 Epilepsy, unspecified, not intractable, without status epilepticus: Secondary | ICD-10-CM | POA: Diagnosis not present

## 2024-03-02 DIAGNOSIS — R443 Hallucinations, unspecified: Secondary | ICD-10-CM | POA: Diagnosis not present

## 2024-03-02 DIAGNOSIS — R4189 Other symptoms and signs involving cognitive functions and awareness: Secondary | ICD-10-CM | POA: Diagnosis not present

## 2024-03-02 DIAGNOSIS — Z23 Encounter for immunization: Secondary | ICD-10-CM | POA: Diagnosis not present

## 2024-03-14 DIAGNOSIS — K4090 Unilateral inguinal hernia, without obstruction or gangrene, not specified as recurrent: Secondary | ICD-10-CM | POA: Diagnosis not present

## 2024-04-19 DIAGNOSIS — F0390 Unspecified dementia without behavioral disturbance: Secondary | ICD-10-CM | POA: Diagnosis not present

## 2024-04-19 DIAGNOSIS — K4090 Unilateral inguinal hernia, without obstruction or gangrene, not specified as recurrent: Secondary | ICD-10-CM | POA: Diagnosis not present

## 2024-04-19 DIAGNOSIS — E118 Type 2 diabetes mellitus with unspecified complications: Secondary | ICD-10-CM | POA: Diagnosis not present

## 2024-04-19 DIAGNOSIS — I10 Essential (primary) hypertension: Secondary | ICD-10-CM | POA: Diagnosis not present
